# Patient Record
Sex: Female | Born: 1940
Health system: Southern US, Community
[De-identification: ages and names within clinical notes are randomized; demographics above are authoritative.]

## PROBLEM LIST (undated history)

## (undated) DIAGNOSIS — M75102 Unspecified rotator cuff tear or rupture of left shoulder, not specified as traumatic: Secondary | ICD-10-CM

## (undated) DIAGNOSIS — G44219 Episodic tension-type headache, not intractable: Secondary | ICD-10-CM

## (undated) DIAGNOSIS — K219 Gastro-esophageal reflux disease without esophagitis: Secondary | ICD-10-CM

## (undated) DIAGNOSIS — K648 Other hemorrhoids: Secondary | ICD-10-CM

## (undated) DIAGNOSIS — M1990 Unspecified osteoarthritis, unspecified site: Secondary | ICD-10-CM

## (undated) DIAGNOSIS — E785 Hyperlipidemia, unspecified: Secondary | ICD-10-CM

## (undated) DIAGNOSIS — H269 Unspecified cataract: Secondary | ICD-10-CM

## (undated) DIAGNOSIS — R011 Cardiac murmur, unspecified: Secondary | ICD-10-CM

## (undated) DIAGNOSIS — N200 Calculus of kidney: Secondary | ICD-10-CM

## (undated) DIAGNOSIS — H409 Unspecified glaucoma: Secondary | ICD-10-CM

## (undated) DIAGNOSIS — I10 Essential (primary) hypertension: Secondary | ICD-10-CM

## (undated) HISTORY — DX: Cardiac murmur, unspecified: R01.1

## (undated) HISTORY — DX: Unspecified osteoarthritis, unspecified site: M19.90

## (undated) HISTORY — DX: Essential (primary) hypertension: I10

## (undated) HISTORY — DX: Hyperlipidemia, unspecified: E78.5

## (undated) HISTORY — DX: Unspecified cataract: H26.9

## (undated) HISTORY — DX: Gastro-esophageal reflux disease without esophagitis: K21.9

## (undated) HISTORY — DX: Other hemorrhoids: K64.8

## (undated) HISTORY — DX: Episodic tension-type headache, not intractable: G44.219

## (undated) HISTORY — PX: EYE SURGERY: SHX253

## (undated) HISTORY — DX: Unspecified glaucoma: H40.9

## (undated) HISTORY — PX: COLONOSCOPY: SHX174

## (undated) HISTORY — PX: TOTAL ABDOMINAL HYSTERECTOMY W/ BILATERAL SALPINGOOPHORECTOMY: SHX83

---

## 1989-02-06 HISTORY — PX: TOTAL ABDOMINAL HYSTERECTOMY W/ BILATERAL SALPINGOOPHORECTOMY: SHX83

## 1999-06-11 ENCOUNTER — Encounter: Payer: Self-pay | Admitting: Obstetrics and Gynecology

## 1999-06-11 ENCOUNTER — Encounter: Admission: RE | Admit: 1999-06-11 | Discharge: 1999-06-11 | Payer: Self-pay | Admitting: Obstetrics & Gynecology

## 1999-12-31 ENCOUNTER — Other Ambulatory Visit: Admission: RE | Admit: 1999-12-31 | Discharge: 1999-12-31 | Payer: Self-pay | Admitting: Obstetrics and Gynecology

## 2000-11-23 ENCOUNTER — Encounter: Payer: Self-pay | Admitting: Obstetrics and Gynecology

## 2000-11-23 ENCOUNTER — Encounter: Admission: RE | Admit: 2000-11-23 | Discharge: 2000-11-23 | Payer: Self-pay | Admitting: Obstetrics and Gynecology

## 2001-04-11 ENCOUNTER — Encounter: Payer: Self-pay | Admitting: Gastroenterology

## 2001-04-28 ENCOUNTER — Encounter: Payer: Self-pay | Admitting: Gastroenterology

## 2001-06-16 ENCOUNTER — Encounter: Payer: Self-pay | Admitting: Gastroenterology

## 2001-09-15 ENCOUNTER — Other Ambulatory Visit: Admission: RE | Admit: 2001-09-15 | Discharge: 2001-09-15 | Payer: Self-pay | Admitting: Obstetrics and Gynecology

## 2002-06-08 HISTORY — PX: KNEE ARTHROSCOPY: SUR90

## 2002-12-21 ENCOUNTER — Encounter: Payer: Self-pay | Admitting: Obstetrics and Gynecology

## 2002-12-21 ENCOUNTER — Encounter: Admission: RE | Admit: 2002-12-21 | Discharge: 2002-12-21 | Payer: Self-pay | Admitting: Obstetrics and Gynecology

## 2004-04-22 ENCOUNTER — Encounter: Admission: RE | Admit: 2004-04-22 | Discharge: 2004-04-22 | Payer: Self-pay | Admitting: Obstetrics and Gynecology

## 2004-06-30 ENCOUNTER — Ambulatory Visit: Payer: Self-pay | Admitting: Internal Medicine

## 2004-07-03 ENCOUNTER — Ambulatory Visit: Payer: Self-pay | Admitting: Internal Medicine

## 2004-08-14 ENCOUNTER — Ambulatory Visit: Payer: Self-pay | Admitting: Internal Medicine

## 2004-08-20 ENCOUNTER — Ambulatory Visit: Payer: Self-pay | Admitting: Internal Medicine

## 2004-09-09 ENCOUNTER — Ambulatory Visit: Payer: Self-pay | Admitting: Internal Medicine

## 2004-09-26 ENCOUNTER — Ambulatory Visit: Payer: Self-pay | Admitting: Gastroenterology

## 2004-10-10 ENCOUNTER — Ambulatory Visit: Payer: Self-pay | Admitting: Gastroenterology

## 2004-12-10 ENCOUNTER — Ambulatory Visit: Payer: Self-pay | Admitting: Internal Medicine

## 2004-12-18 ENCOUNTER — Ambulatory Visit: Payer: Self-pay

## 2004-12-24 ENCOUNTER — Ambulatory Visit: Payer: Self-pay | Admitting: Internal Medicine

## 2005-07-23 ENCOUNTER — Encounter: Admission: RE | Admit: 2005-07-23 | Discharge: 2005-07-23 | Payer: Self-pay | Admitting: Obstetrics and Gynecology

## 2005-07-27 ENCOUNTER — Ambulatory Visit: Payer: Self-pay | Admitting: Internal Medicine

## 2005-09-07 ENCOUNTER — Ambulatory Visit: Payer: Self-pay | Admitting: Pulmonary Disease

## 2005-10-30 ENCOUNTER — Ambulatory Visit: Payer: Self-pay | Admitting: Pulmonary Disease

## 2005-11-16 ENCOUNTER — Ambulatory Visit: Payer: Self-pay | Admitting: Internal Medicine

## 2005-11-18 ENCOUNTER — Ambulatory Visit: Payer: Self-pay | Admitting: Cardiology

## 2005-11-23 ENCOUNTER — Ambulatory Visit: Payer: Self-pay | Admitting: Internal Medicine

## 2005-12-22 ENCOUNTER — Ambulatory Visit: Payer: Self-pay | Admitting: Internal Medicine

## 2006-07-16 ENCOUNTER — Ambulatory Visit: Payer: Self-pay | Admitting: Internal Medicine

## 2006-07-26 ENCOUNTER — Encounter: Admission: RE | Admit: 2006-07-26 | Discharge: 2006-07-26 | Payer: Self-pay | Admitting: Obstetrics and Gynecology

## 2006-11-18 ENCOUNTER — Ambulatory Visit: Payer: Self-pay | Admitting: Internal Medicine

## 2006-12-29 ENCOUNTER — Ambulatory Visit: Payer: Self-pay | Admitting: Internal Medicine

## 2006-12-29 LAB — CONVERTED CEMR LAB
AST: 19 units/L (ref 0–37)
Alkaline Phosphatase: 71 units/L (ref 39–117)
BUN: 16 mg/dL (ref 6–23)
Bilirubin, Direct: 0.1 mg/dL (ref 0.0–0.3)
CO2: 32 meq/L (ref 19–32)
Eosinophils Relative: 2.6 % (ref 0.0–5.0)
GFR calc Af Amer: 93 mL/min
GFR calc non Af Amer: 77 mL/min
Glucose, Bld: 94 mg/dL (ref 70–99)
HCT: 37.8 % (ref 36.0–46.0)
HDL: 47.1 mg/dL (ref >39.0)
Hemoglobin: 12.7 g/dL (ref 12.0–15.0)
Ketones, ur: NEGATIVE mg/dL
Lymphocytes Relative: 50.8 % — ABNORMAL HIGH (ref 12.0–46.0)
MCV: 89.4 fL (ref 78.0–100.0)
Neutro Abs: 2.1 10*3/uL (ref 1.4–7.7)
Neutrophils Relative %: 36.9 % — ABNORMAL LOW (ref 43.0–77.0)
Platelets: 185 10*3/uL (ref 150–400)
RDW: 12.8 % (ref 11.5–14.6)
Sed Rate: 13 mm/hr (ref 0–25)
Sodium: 147 meq/L — ABNORMAL HIGH (ref 135–145)
TSH: 1.25 microintl units/mL (ref 0.35–5.50)
Total CHOL/HDL Ratio: 5.1
Total Protein: 6.8 g/dL (ref 6.0–8.3)
Triglycerides: 90 mg/dL (ref 0–149)
Urobilinogen, UA: 0.2 (ref 0.0–1.0)
VLDL: 18 mg/dL (ref 0–40)

## 2007-01-12 ENCOUNTER — Ambulatory Visit: Payer: Self-pay | Admitting: Internal Medicine

## 2007-01-14 ENCOUNTER — Encounter: Admission: RE | Admit: 2007-01-14 | Discharge: 2007-02-11 | Payer: Self-pay | Admitting: Internal Medicine

## 2007-01-20 ENCOUNTER — Ambulatory Visit: Payer: Self-pay | Admitting: Internal Medicine

## 2007-01-20 LAB — CONVERTED CEMR LAB
Calcium: 9.5 mg/dL (ref 8.4–10.5)
Chloride: 112 meq/L (ref 96–112)
Creatinine, Ser: 0.8 mg/dL (ref 0.4–1.2)
GFR calc non Af Amer: 77 mL/min
Potassium: 3.6 meq/L (ref 3.5–5.1)
Sodium: 145 meq/L (ref 135–145)

## 2007-02-04 ENCOUNTER — Ambulatory Visit: Payer: Self-pay | Admitting: Internal Medicine

## 2007-04-22 ENCOUNTER — Ambulatory Visit: Payer: Self-pay | Admitting: Internal Medicine

## 2007-05-02 ENCOUNTER — Ambulatory Visit: Payer: Self-pay | Admitting: Internal Medicine

## 2007-05-02 ENCOUNTER — Encounter: Payer: Self-pay | Admitting: Internal Medicine

## 2007-06-23 ENCOUNTER — Telehealth (INDEPENDENT_AMBULATORY_CARE_PROVIDER_SITE_OTHER): Payer: Self-pay | Admitting: *Deleted

## 2007-06-30 ENCOUNTER — Ambulatory Visit: Payer: Self-pay | Admitting: Internal Medicine

## 2007-06-30 DIAGNOSIS — E669 Obesity, unspecified: Secondary | ICD-10-CM

## 2007-06-30 DIAGNOSIS — G44219 Episodic tension-type headache, not intractable: Secondary | ICD-10-CM | POA: Insufficient documentation

## 2007-06-30 DIAGNOSIS — K219 Gastro-esophageal reflux disease without esophagitis: Secondary | ICD-10-CM

## 2007-07-09 ENCOUNTER — Emergency Department (HOSPITAL_COMMUNITY): Admission: EM | Admit: 2007-07-09 | Discharge: 2007-07-09 | Payer: Self-pay | Admitting: Emergency Medicine

## 2007-07-12 ENCOUNTER — Ambulatory Visit: Payer: Self-pay | Admitting: Internal Medicine

## 2007-07-12 ENCOUNTER — Encounter: Admission: RE | Admit: 2007-07-12 | Discharge: 2007-07-12 | Payer: Self-pay | Admitting: Internal Medicine

## 2007-07-12 DIAGNOSIS — R1013 Epigastric pain: Secondary | ICD-10-CM | POA: Insufficient documentation

## 2007-07-14 ENCOUNTER — Telehealth (INDEPENDENT_AMBULATORY_CARE_PROVIDER_SITE_OTHER): Payer: Self-pay

## 2007-07-14 LAB — CONVERTED CEMR LAB
ALT: 15 units/L (ref 0–35)
AST: 16 units/L (ref 0–37)
Alkaline Phosphatase: 69 units/L (ref 39–117)
Bilirubin, Direct: 0.2 mg/dL (ref 0.0–0.3)
Total Bilirubin: 1.1 mg/dL (ref 0.3–1.2)

## 2007-07-22 ENCOUNTER — Ambulatory Visit: Payer: Self-pay | Admitting: Internal Medicine

## 2007-07-22 DIAGNOSIS — L509 Urticaria, unspecified: Secondary | ICD-10-CM | POA: Insufficient documentation

## 2007-07-22 LAB — CONVERTED CEMR LAB
Chloride: 104 meq/L (ref 96–112)
Creatinine, Ser: 1 mg/dL (ref 0.4–1.2)
Sodium: 141 meq/L (ref 135–145)

## 2007-08-01 ENCOUNTER — Ambulatory Visit: Payer: Self-pay | Admitting: Internal Medicine

## 2007-08-17 ENCOUNTER — Encounter: Admission: RE | Admit: 2007-08-17 | Discharge: 2007-08-17 | Payer: Self-pay | Admitting: Obstetrics and Gynecology

## 2007-12-06 ENCOUNTER — Ambulatory Visit: Payer: Self-pay | Admitting: Internal Medicine

## 2007-12-06 ENCOUNTER — Telehealth (INDEPENDENT_AMBULATORY_CARE_PROVIDER_SITE_OTHER): Payer: Self-pay | Admitting: *Deleted

## 2007-12-06 DIAGNOSIS — R209 Unspecified disturbances of skin sensation: Secondary | ICD-10-CM | POA: Insufficient documentation

## 2007-12-06 LAB — CONVERTED CEMR LAB
ALT: 28 units/L (ref 0–35)
AST: 49 units/L — ABNORMAL HIGH (ref 0–37)
Alkaline Phosphatase: 57 units/L (ref 39–117)
BUN: 10 mg/dL (ref 6–23)
Basophils Absolute: 0.1 10*3/uL (ref 0.0–0.1)
Bilirubin, Direct: 0.1 mg/dL (ref 0.0–0.3)
Calcium: 9.4 mg/dL (ref 8.4–10.5)
Creatinine, Ser: 1 mg/dL (ref 0.4–1.2)
Eosinophils Relative: 8.3 % — ABNORMAL HIGH (ref 0.0–5.0)
GFR calc Af Amer: 71 mL/min
GFR calc non Af Amer: 59 mL/min
Glucose, Bld: 115 mg/dL — ABNORMAL HIGH (ref 70–99)
HCT: 39 % (ref 36.0–46.0)
Lymphocytes Relative: 46.9 % — ABNORMAL HIGH (ref 12.0–46.0)
MCHC: 34.1 g/dL (ref 30.0–36.0)
Neutro Abs: 2.5 10*3/uL (ref 1.4–7.7)
Platelets: 206 10*3/uL (ref 150–400)
RBC: 4.33 M/uL (ref 3.87–5.11)
TSH: 1.28 microintl units/mL (ref 0.35–5.50)
Total Bilirubin: 0.8 mg/dL (ref 0.3–1.2)
WBC: 6.8 10*3/uL (ref 4.5–10.5)

## 2007-12-21 ENCOUNTER — Ambulatory Visit: Payer: Self-pay | Admitting: Internal Medicine

## 2007-12-30 LAB — CONVERTED CEMR LAB
BUN: 8 mg/dL (ref 6–23)
Basophils Relative: 0.5 % (ref 0.0–1.0)
Calcium: 9.1 mg/dL (ref 8.4–10.5)
Creatinine, Ser: 0.9 mg/dL (ref 0.4–1.2)
Eosinophils Absolute: 0.3 10*3/uL (ref 0.0–0.7)
Eosinophils Relative: 4.2 % (ref 0.0–5.0)
GFR calc Af Amer: 81 mL/min
GFR calc non Af Amer: 67 mL/min
Glucose, Bld: 132 mg/dL — ABNORMAL HIGH (ref 70–99)
HCT: 38.2 % (ref 36.0–46.0)
Lymphocytes Relative: 45.6 % (ref 12.0–46.0)
Monocytes Relative: 4.8 % (ref 3.0–12.0)
Neutro Abs: 2.7 10*3/uL (ref 1.4–7.7)
Platelets: 195 10*3/uL (ref 150–400)
Potassium: 3 meq/L — ABNORMAL LOW (ref 3.5–5.1)
Sodium: 144 meq/L (ref 135–145)

## 2008-01-03 ENCOUNTER — Ambulatory Visit: Payer: Self-pay | Admitting: Gastroenterology

## 2008-01-04 ENCOUNTER — Encounter: Payer: Self-pay | Admitting: Gastroenterology

## 2008-01-12 ENCOUNTER — Telehealth: Payer: Self-pay | Admitting: Gastroenterology

## 2008-02-02 ENCOUNTER — Ambulatory Visit: Payer: Self-pay | Admitting: Internal Medicine

## 2008-02-02 DIAGNOSIS — R609 Edema, unspecified: Secondary | ICD-10-CM

## 2008-02-02 LAB — CONVERTED CEMR LAB
BUN: 16 mg/dL (ref 6–23)
CO2: 28 meq/L (ref 19–32)
Creatinine, Ser: 1.1 mg/dL (ref 0.4–1.2)
GFR calc Af Amer: 64 mL/min
Glucose, Bld: 94 mg/dL (ref 70–99)
Sodium: 140 meq/L (ref 135–145)

## 2008-02-06 ENCOUNTER — Ambulatory Visit: Payer: Self-pay | Admitting: Gastroenterology

## 2008-03-15 ENCOUNTER — Ambulatory Visit: Payer: Self-pay | Admitting: Internal Medicine

## 2008-03-15 LAB — CONVERTED CEMR LAB
BUN: 19 mg/dL (ref 6–23)
Calcium: 9.3 mg/dL (ref 8.4–10.5)
Creatinine, Ser: 1 mg/dL (ref 0.4–1.2)
GFR calc Af Amer: 71 mL/min
GFR calc non Af Amer: 59 mL/min
Glucose, Bld: 94 mg/dL (ref 70–99)
Sodium: 139 meq/L (ref 135–145)

## 2008-05-21 ENCOUNTER — Ambulatory Visit: Payer: Self-pay | Admitting: Internal Medicine

## 2008-08-07 ENCOUNTER — Ambulatory Visit: Payer: Self-pay | Admitting: Gastroenterology

## 2008-08-08 ENCOUNTER — Encounter: Payer: Self-pay | Admitting: Gastroenterology

## 2008-08-20 ENCOUNTER — Encounter: Admission: RE | Admit: 2008-08-20 | Discharge: 2008-08-20 | Payer: Self-pay | Admitting: Obstetrics and Gynecology

## 2008-09-21 ENCOUNTER — Ambulatory Visit: Payer: Self-pay | Admitting: Internal Medicine

## 2008-09-21 DIAGNOSIS — J309 Allergic rhinitis, unspecified: Secondary | ICD-10-CM | POA: Insufficient documentation

## 2008-09-21 DIAGNOSIS — K625 Hemorrhage of anus and rectum: Secondary | ICD-10-CM

## 2008-10-04 ENCOUNTER — Ambulatory Visit: Payer: Self-pay | Admitting: Gastroenterology

## 2008-10-04 DIAGNOSIS — K921 Melena: Secondary | ICD-10-CM

## 2008-10-04 DIAGNOSIS — K5900 Constipation, unspecified: Secondary | ICD-10-CM | POA: Insufficient documentation

## 2008-10-04 DIAGNOSIS — M545 Low back pain: Secondary | ICD-10-CM

## 2008-10-16 ENCOUNTER — Telehealth: Payer: Self-pay | Admitting: Gastroenterology

## 2008-11-07 ENCOUNTER — Telehealth (INDEPENDENT_AMBULATORY_CARE_PROVIDER_SITE_OTHER): Payer: Self-pay | Admitting: *Deleted

## 2008-11-20 ENCOUNTER — Ambulatory Visit: Payer: Self-pay | Admitting: Gastroenterology

## 2008-11-20 HISTORY — PX: COLONOSCOPY: SHX5424

## 2009-01-10 ENCOUNTER — Encounter: Payer: Self-pay | Admitting: Internal Medicine

## 2009-02-12 ENCOUNTER — Ambulatory Visit: Payer: Self-pay | Admitting: Internal Medicine

## 2009-02-12 ENCOUNTER — Encounter: Payer: Self-pay | Admitting: Adult Health

## 2009-02-12 DIAGNOSIS — R310 Gross hematuria: Secondary | ICD-10-CM

## 2009-02-12 LAB — CONVERTED CEMR LAB
Total Protein, Urine: 100 mg/dL
pH: 5 (ref 5.0–8.0)

## 2009-02-13 ENCOUNTER — Encounter: Payer: Self-pay | Admitting: Adult Health

## 2009-02-13 LAB — CONVERTED CEMR LAB
Basophils Absolute: 0 10*3/uL (ref 0.0–0.1)
Basophils Relative: 0.5 % (ref 0.0–3.0)
Calcium: 9.7 mg/dL (ref 8.4–10.5)
Chloride: 107 meq/L (ref 96–112)
Creatinine, Ser: 1.2 mg/dL (ref 0.4–1.2)
GFR calc non Af Amer: 57.45 mL/min (ref >60)
Glucose, Bld: 78 mg/dL (ref 70–99)
Lymphocytes Relative: 46.7 % — ABNORMAL HIGH (ref 12.0–46.0)
Lymphs Abs: 3.1 10*3/uL (ref 0.7–4.0)
MCHC: 33.8 g/dL (ref 30.0–36.0)
Monocytes Absolute: 0.5 10*3/uL (ref 0.1–1.0)
Neutro Abs: 3 10*3/uL (ref 1.4–7.7)
Neutrophils Relative %: 44.6 % (ref 43.0–77.0)
Potassium: 4.5 meq/L (ref 3.5–5.1)
Sodium: 142 meq/L (ref 135–145)

## 2009-02-19 ENCOUNTER — Ambulatory Visit: Payer: Self-pay | Admitting: Gastroenterology

## 2009-02-19 DIAGNOSIS — K648 Other hemorrhoids: Secondary | ICD-10-CM | POA: Insufficient documentation

## 2009-02-25 ENCOUNTER — Ambulatory Visit: Payer: Self-pay | Admitting: Internal Medicine

## 2009-02-25 DIAGNOSIS — E785 Hyperlipidemia, unspecified: Secondary | ICD-10-CM | POA: Insufficient documentation

## 2009-02-25 LAB — CONVERTED CEMR LAB
Albumin: 4.1 g/dL (ref 3.5–5.2)
Alkaline Phosphatase: 85 units/L (ref 39–117)
Bilirubin, Direct: 0.1 mg/dL (ref 0.0–0.3)
Cholesterol: 235 mg/dL — ABNORMAL HIGH (ref 0–200)
HDL: 50.2 mg/dL (ref >39.00)
Total Bilirubin: 1 mg/dL (ref 0.3–1.2)
Total CHOL/HDL Ratio: 5
Triglycerides: 69 mg/dL (ref 0.0–149.0)

## 2009-02-27 ENCOUNTER — Encounter: Payer: Self-pay | Admitting: Adult Health

## 2009-03-18 ENCOUNTER — Ambulatory Visit (HOSPITAL_COMMUNITY): Admission: RE | Admit: 2009-03-18 | Discharge: 2009-03-18 | Payer: Self-pay | Admitting: Urology

## 2009-06-17 ENCOUNTER — Encounter: Payer: Self-pay | Admitting: Adult Health

## 2009-07-08 ENCOUNTER — Ambulatory Visit: Payer: Self-pay | Admitting: Internal Medicine

## 2009-09-05 ENCOUNTER — Encounter: Admission: RE | Admit: 2009-09-05 | Discharge: 2009-09-05 | Payer: Self-pay | Admitting: Obstetrics and Gynecology

## 2009-10-01 ENCOUNTER — Telehealth: Payer: Self-pay | Admitting: Pulmonary Disease

## 2009-10-17 ENCOUNTER — Ambulatory Visit: Payer: Self-pay | Admitting: Internal Medicine

## 2009-11-28 ENCOUNTER — Ambulatory Visit: Payer: Self-pay | Admitting: Internal Medicine

## 2010-02-26 ENCOUNTER — Ambulatory Visit: Payer: Self-pay | Admitting: Internal Medicine

## 2010-06-10 ENCOUNTER — Encounter: Payer: Self-pay | Admitting: Adult Health

## 2010-06-10 ENCOUNTER — Ambulatory Visit
Admission: RE | Admit: 2010-06-10 | Discharge: 2010-06-10 | Payer: Self-pay | Source: Home / Self Care | Attending: Internal Medicine | Admitting: Internal Medicine

## 2010-06-10 ENCOUNTER — Other Ambulatory Visit: Payer: Self-pay | Admitting: Adult Health

## 2010-06-10 DIAGNOSIS — R1012 Left upper quadrant pain: Secondary | ICD-10-CM | POA: Insufficient documentation

## 2010-06-10 LAB — CONVERTED CEMR LAB
Casts: NONE SEEN /lpf
Crystals: NONE SEEN

## 2010-06-10 LAB — URINALYSIS
Bilirubin Urine: NEGATIVE
Hemoglobin, Urine: NEGATIVE
Ketones, ur: NEGATIVE
Leukocytes, UA: NEGATIVE
Nitrite: NEGATIVE
Specific Gravity, Urine: 1.015 (ref 1.000–1.030)
Total Protein, Urine: NEGATIVE
Urine Glucose: NEGATIVE
Urobilinogen, UA: 0.2 (ref 0.0–1.0)
pH: 6 (ref 5.0–8.0)

## 2010-06-10 LAB — BASIC METABOLIC PANEL
BUN: 20 mg/dL (ref 6–23)
CO2: 22 mEq/L (ref 19–32)
Calcium: 9.7 mg/dL (ref 8.4–10.5)
Chloride: 110 mEq/L (ref 96–112)
Creatinine, Ser: 0.9 mg/dL (ref 0.4–1.2)
GFR: 84.05 mL/min (ref >60.00)
Glucose, Bld: 91 mg/dL (ref 70–99)
Potassium: 4.2 mEq/L (ref 3.5–5.1)
Sodium: 140 mEq/L (ref 135–145)

## 2010-06-10 LAB — HEPATIC FUNCTION PANEL
ALT: 14 U/L (ref 0–35)
AST: 19 U/L (ref 0–37)
Albumin: 3.8 g/dL (ref 3.5–5.2)
Alkaline Phosphatase: 91 U/L (ref 39–117)
Bilirubin, Direct: 0.1 mg/dL (ref 0.0–0.3)
Total Bilirubin: 0.7 mg/dL (ref 0.3–1.2)
Total Protein: 7.6 g/dL (ref 6.0–8.3)

## 2010-06-10 LAB — CBC WITH DIFFERENTIAL/PLATELET
Basophils Absolute: 0 10*3/uL (ref 0.0–0.1)
Basophils Relative: 0.5 % (ref 0.0–3.0)
Eosinophils Absolute: 0.1 10*3/uL (ref 0.0–0.7)
Eosinophils Relative: 1.9 % (ref 0.0–5.0)
HCT: 40.8 % (ref 36.0–46.0)
Hemoglobin: 13.6 g/dL (ref 12.0–15.0)
Lymphocytes Relative: 46.9 % — ABNORMAL HIGH (ref 12.0–46.0)
Lymphs Abs: 3.3 10*3/uL (ref 0.7–4.0)
MCHC: 33.3 g/dL (ref 30.0–36.0)
MCV: 91.4 fl (ref 78.0–100.0)
Monocytes Absolute: 0.4 10*3/uL (ref 0.1–1.0)
Monocytes Relative: 6 % (ref 3.0–12.0)
Neutro Abs: 3.2 10*3/uL (ref 1.4–7.7)
Neutrophils Relative %: 44.7 % (ref 43.0–77.0)
Platelets: 208 10*3/uL (ref 150.0–400.0)
RBC: 4.46 Mil/uL (ref 3.87–5.11)
RDW: 12.9 % (ref 11.5–14.6)
WBC: 7.1 10*3/uL (ref 4.5–10.5)

## 2010-06-10 LAB — LIPASE: Lipase: 23 U/L (ref 11.0–59.0)

## 2010-06-10 LAB — SEDIMENTATION RATE: Sed Rate: 23 mm/hr — ABNORMAL HIGH (ref 0–22)

## 2010-06-10 LAB — AMYLASE: Amylase: 77 U/L (ref 27–131)

## 2010-06-11 ENCOUNTER — Encounter: Payer: Self-pay | Admitting: Adult Health

## 2010-06-12 ENCOUNTER — Telehealth: Payer: Self-pay | Admitting: Adult Health

## 2010-06-13 ENCOUNTER — Ambulatory Visit
Admission: RE | Admit: 2010-06-13 | Discharge: 2010-06-13 | Payer: Self-pay | Source: Home / Self Care | Attending: Internal Medicine | Admitting: Internal Medicine

## 2010-06-20 ENCOUNTER — Encounter: Payer: Self-pay | Admitting: Internal Medicine

## 2010-06-25 ENCOUNTER — Encounter: Payer: Self-pay | Admitting: Internal Medicine

## 2010-06-25 ENCOUNTER — Ambulatory Visit
Admission: RE | Admit: 2010-06-25 | Discharge: 2010-06-25 | Payer: Self-pay | Source: Home / Self Care | Attending: Internal Medicine | Admitting: Internal Medicine

## 2010-07-08 NOTE — Progress Notes (Signed)
Summary: sick    Phone Note Call from Patient Call back at [phone]   Caller: Patient Call For: Catherine Hoffman Reason for Call: Talk to Nurse Summary of Call: plegm and cough x 3-4 days.  Meds not helping - allergies, welts,itching.. pt called back. call cell [phone] Initial call taken by: Eugene Gavia,  October 01, 2009 9:24 AM  Follow-up for Phone Call        [facility].    Megan Reynolds LPN  October 01, 2009 10:02 AM   PT RETURNED CALL FROM TRIAGE. CALL CELL [phone]. Tivis Ringer, CNA  October 01, 2009 12:10 PM  MW pt--pt c/o productive cough with yellow phlegm, fever, wheezing, itching and whelps on arms and legs x 4 days. Please advise. Carron Curie CMA  October 01, 2009 2:41 PM allergies: NKDA  [facility]  Additional Follow-up for Phone Call Additional follow up Details #1::        per SN--augmentin 875mg  1 tablet two times a day #14, and medrol dose pak #1. Rx sent. pt aware.Carron Curie CMA  October 01, 2009 3:55 PM     New/Updated Medications: AUGMENTIN 875-125 MG TABS (AMOXICILLIN-POT CLAVULANATE) Take 1 tablet by mouth two times a day MEDROL (PAK) 4 MG TABS (METHYLPREDNISOLONE) as directed Prescriptions: MEDROL (PAK) 4 MG TABS (METHYLPREDNISOLONE) as directed  #1 pak x 0   Entered by:   Carron Curie CMA   Authorized by:   Michele Mcalpine MD   Signed by:   Carron Curie CMA on 10/01/2009   Method used:   Electronically to        [facility] [id number] (retail)       [address] [place], [place]  [address]       Ph: [phone]       Fax: [phone]   RxID:   [id number] AUGMENTIN 875-125 MG TABS (AMOXICILLIN-POT CLAVULANATE) Take 1 tablet by mouth two times a day  #14 x 0   Entered by:   Carron Curie CMA   Authorized by:   Michele Mcalpine MD   Signed by:   Carron Curie CMA on 10/01/2009   Method used:   Electronically to        [facility] [id number] (retail)       [address] [place], [place]  [address]       Ph: [phone]       Fax: [phone]   RxID:   [id number]

## 2010-07-08 NOTE — Assessment & Plan Note (Signed)
Summary: Primary svc/ f/u edema/hyperlipidemia   Primary Provider/Referring Provider:  Sandrea Hughs, MD  CC:  3 month followup.  Pt denies any complaints today.Catherine Hoffman  History of Present Illness: 70 yobf  never smoker with hypertension, degenerative arthritis, and difficulty following through on instructions regarding medications and weight loss.a .   September 21, 2008 new onset  BRBPR  < 1 tbsp 4/14 resolved but c/o chronic constipation worse than usual, thinks she was straining with bm. No melena or abd pain. also c/o increase nasal stuffy x 2 week with onset of heavy pollen, nasal steroids not working.  Referred to GI  July 08, 2009 3 month followup.  Pt denies any complaints today. edema well controlled. Pt denies any significant sore throat, dysphagia, itching, sneezing,  nasal congestion or excess secretions,  fever, chills, sweats, unintended wt loss, pleuritic or exertional cp, hempoptysis, change in activity tolerance  orthopnea pnd or leg swelling   Current Medications (verified): 1)  Omeprazole 40 Mg Cpdr (Omeprazole) .... Take 1 Capsule By Mouth Two Times A Day 30 Minutes Before Meals 2)  Aspirin 81 Mg Tbec (Aspirin) .Catherine Hoffman.. 1 Once Daily 3)  Aldactone 25 Mg  Tabs (Spironolactone) .... Take 1 Tablet By Mouth Two Times A Day 4)  Klor-Con M20 20 Meq  Tbcr (Potassium Chloride Crys Cr) .Catherine Hoffman.. 1 By Mouth  Once Daily 5)  Trazodone Hcl 50 Mg  Tabs (Trazodone Hcl) .... 2 By Mouth At Bedtime 6)  Oscal 500/200 D-3 500-200 Mg-Unit  Tabs (Calcium-Vitamin D) .... Take 1 Tablet By Mouth Two Times A Day 7)  Premarin 0.3 Mg  Tabs (Estrogens Conjugated) .... Once Daily 8)  Meloxicam 7.5 Mg  Tabs (Meloxicam) .Catherine Hoffman.. 1 Tab Every 12 Hours As Needed 9)  Meclizine Hcl 25 Mg Tabs (Meclizine Hcl) .Catherine Hoffman.. 1 Tab Every 4 Hours As Needed For Dizziness 10)  Tylenol Ex St Arthritis Pain 500 Mg  Tabs (Acetaminophen) .... Per Bottle As Needed 11)  Claritin 10 Mg  Tabs (Loratadine) .... Take 1 Tablet By Mouth Once A Day As  Needed 12)  Fluticasone Propionate 50 Mcg/act Susp (Fluticasone Propionate) .... 2 Puffs Two Times A Day As Needed 13)  Sorbitol 70 % Soln (Sorbitol) .Catherine Hoffman.. 1-2 Tbsp At Bedtime As Needed For Constipation 14)  Afrin Nasal Spray 0.05 % Soln (Oxymetazoline Hcl) .... As Directed As Needed  Allergies (verified): No Known Drug Allergies  Past History:  Past Medical History: GERD(ICD-530.81)..............................................................................Catherine KitchenRussella Dar    - EGD  04/28/2001  esophagitis with HH Chronic constipation Internal Hemorrhoids    - Colonoscopy 5/52006    - Colonoscopy ok except hem 11/20/08 MORBID OBESITY (ICD-278.01)     - Target wt  =  158  for BMI < 30  EPISODIC TENSION TYPE HEADACHE (ICD-339.11) Hypertension Degenerative arthritis   - Arthroscopy Left knee, Dr Barry Dienes Herpes zoster-1998 with no significant residual R T 12 Hyperlipidemia     - [place] LDL < 160 (HBP only known RF)  Health Maintenance....................................................................................Catherine KitchenWert    - Med Calendar 03/15/08    - Pneumovax February 25, 2009     - Td February 25, 2009    - CPX  February 25, 2009   Vital Signs:  Patient profile:   70 year old female Weight:      201 pounds O2 Sat:      98 % on Room air Temp:     97.5 degrees F oral Pulse rate:   67 / minute BP sitting:   128 / 82  (  left arm)  Vitals Entered By: Vernie Murders (July 08, 2009 4:28 PM)  O2 Flow:  Room air  Physical Exam  Additional Exam:  wt  207 May 21, 2008 > 202 September 21, 2008 >>205 February 12, 2009 > 192 February 25, 2009 > 201 July 08, 2009  GENERAL:  A/Ox3; pleasant & cooperative.NAD HEENT:  [place]/AT, EOM-wnl, PERRLA, EACs-clear, TMs-wnl, NOSE-mod nonspecific Turbinate edema, THROAT-clear & wnl. NECK:  Supple w/ fair ROM; no JVD; normal carotid impulses w/o bruits; no thyromegaly or nodules palpated; no lymphadenopathy. CHEST:  Clear to P & A; w/o, wheezes/  rales/ or rhonchi. HEART:  RRR, no m/r/g  heard.  pedal pulses sym reduced bilaterally ABDOMEN:  Soft & nt; nml bowel sounds; no organomegaly or masses detected. Neg CVA tenderness EXT: Warm bilat,  no calf pain, edema, clubbing, pulses intact    Impression & Recommendations:  Problem # 1:  EDEMA (ICD-782.3) resolved on rx    Each maintenance medication was reviewed in detail including most importantly the difference between maintenance and as needed and under what circumstances the prns are to be used. This was done in the context of a medication calendar review which provided the patient with a user-friendly unambiguous mechanism for medication administration and reconciliation and provides an action plan for all active problems. It is critical that this be shown to every doctor  for modification during the office visit if necessary so the patient can use it as a working document.   Problem # 2:  HYPERCHOLESTEROLEMIA (ICD-272.0)      - [place] LDL < 160 (HBP only known RF) m  last 166, so could easily reach this with wt loss  Labs Reviewed: SGOT: 21 (02/25/2009)   SGPT: 16 (02/25/2009)   HDL:50.20 (02/25/2009), 47.1 (12/29/2006)  LDL:DEL (12/29/2006)  Chol:235 (02/25/2009), 239 (12/29/2006)  Trig:69.0 (02/25/2009), 90 (12/29/2006)  Other Orders: Est. Patient Level III (04540) Flu Vaccine 54yrs + (98119) Admin 1st Vaccine (14782)  Patient Instructions: 1)  See calendar for specific medication instructions and bring it back for each and every office visit for every healthcare provider you see.  Without it,  you may not receive the best quality medical care that we feel you deserve.  2)  Weight control is simply a matter of calorie balance which needs to be tilted in your favor by eating less and exercising more.  To get the most out of exercise, you need to be continuously aware that you are short of breath, but never out of breath, for 30 minutes daily. As you improve, it will actually  be easier for you to do the same amount in  30 minutes so always push to the level where you are short of breath.  If this does not result in gradual weight reduction,  I recommend  a nutritionist for a food diary 3)  Return to office in 3 months, sooner if needed    Immunizations Administered:  Influenza Vaccine # 1:    Vaccine Type: Fluvax 3+    Site: left deltoid    Mfr: GlaxoSmithKline    Dose: 0.5 ml    Route: IM    Given by: Zackery Barefoot CMA    Exp. Date: 12/05/2009    Lot #: [id number]  Flu Vaccine Consent Questions:    Do you have a history of severe allergic reactions to this vaccine? no    Any prior history of allergic reactions to egg and/or gelatin? no    Do you have a  sensitivity to the preservative Thimersol? no    Do you have a past history of Guillan-Barre Syndrome? no    Do you currently have an acute febrile illness? no    Have you ever had a severe reaction to latex? no    Vaccine information given and explained to patient? yes    Are you currently pregnant? no

## 2010-07-08 NOTE — Letter (Signed)
[facility]: [facility]   Imported By: Sherian Rein 06/25/2009 07:21:14  _____________________________________________________________________  External Attachment:    Type:   Image     Comment:   External Document

## 2010-07-08 NOTE — Assessment & Plan Note (Signed)
Summary: Primary svc/ f/u edema/ weight, cholesterol   Primary Provider/Referring Provider:  Sandrea Hughs, MD  CC:  3 month followup.  Marland Kitchen  History of Present Illness: 21  yobf  never smoker with hypertension, degenerative arthritis, and difficulty following through on instructions regarding medications and weight loss.a .   September 21, 2008 new onset  BRBPR  < 1 tbsp 4/14 resolved but c/o chronic constipation worse than usual, thinks she was straining with bm. No melena or abd pain. also c/o increase nasal stuffy x 2 week with onset of heavy pollen, nasal steroids not working.  Referred to GI  January 31, 2011cc  edema well controlled. rec work on wt  May 12, 2011ov  c/o runny nose and headaches x several wks.  She relates this to seasonal allergies.  She also states that she breaks out in hives occ after eating certain foods such as fruits.  She also c/o occ chest tightness. no better on clariton, fluctisaone.     11/25/09 ov  med review new med calendar  February 26, 2010 ov  f/u edema, better.  No orthopnea.  Obesity, lost 6 lbs at [facility].  Pt denies any significant sore throat, dysphagia, itching, sneezing,  nasal congestion or excess secretions,  fever, chills, sweats, unintended wt loss, pleuritic or exertional cp, hempoptysis, change in activity tolerance  .   Current Medications (verified): 1)  Omeprazole 40 Mg Cpdr (Omeprazole) .... Take 1 Capsule By Mouth Before First and Last Meals of The Day 2)  Aspirin 81 Mg Tbec (Aspirin) .Marland Kitchen.. 1 Once Daily 3)  Aldactone 25 Mg  Tabs (Spironolactone) .... Take 1 Tablet By Mouth Two Times A Day 4)  Klor-Con M20 20 Meq  Tbcr (Potassium Chloride Crys Cr) .Marland Kitchen.. 1 By Mouth  Once Daily 5)  Trazodone Hcl 50 Mg  Tabs (Trazodone Hcl) .... 2 By Mouth At Bedtime 6)  Oscal 500/200 D-3 500-200 Mg-Unit  Tabs (Calcium-Vitamin D) .... Take 1 Tablet By Mouth Two Times A Day 7)  Premarin 0.3 Mg  Tabs (Estrogens Conjugated) .... Once Daily 8)  Fluticasone  Propionate 50 Mcg/act Susp (Fluticasone Propionate) .... 2 Puffs Each Nostril Two Times A Day 9)  Stool Softener 100 Mg Caps (Docusate Sodium) .... Take 1 Capsule By Mouth At Bedtime 10)  Meloxicam 7.5 Mg  Tabs (Meloxicam) .Marland Kitchen.. 1 Tab Every 12 Hours As Needed 11)  Meclizine Hcl 25 Mg Tabs (Meclizine Hcl) .Marland Kitchen.. 1 Tab Every 4 Hours As Needed For Dizziness 12)  Tylenol Ex St Arthritis Pain 500 Mg  Tabs (Acetaminophen) .... Per Bottle As Needed 13)  Zyrtec Allergy 10 Mg  Tabs (Cetirizine Hcl) .... Take 1 Tab By Mouth At Bedtime As Needed 14)  Afrin Nasal Spray 0.05 % Soln (Oxymetazoline Hcl) .... 2 Puffs Twice Daily Before Fluticasone For 5 Days As Needed 15)  Sorbitol 70 % Soln (Sorbitol) .Marland Kitchen.. 1-2 Tbsp At Bedtime As Needed For Constipation  Allergies (verified): No Known Drug Allergies  Past History:  Past Medical History: GERD(ICD-530.81)..............................................................................Marland KitchenRussella Dar    - EGD  04/28/2001  esophagitis with HH Chronic constipation Internal Hemorrhoids    - Colonoscopy 5/52006    - Colonoscopy ok except hem 11/20/08 MORBID OBESITY (ICD-278.01)     - Target wt  =  158  for BMI < 30  EPISODIC TENSION TYPE HEADACHE (ICD-339.11) Hypertension Degenerative arthritis   - Arthroscopy Left knee, Dr Barry Dienes Herpes zoster-1998 with no significant residual R T 12 Hyperlipidemia     - [place] LDL <  160 (HBP only known RF)  Health Maintenance..........................................................................................Marland KitchenWert    - Med Calendar 03/15/08    - Pneumovax February 25, 2009     - Td February 25, 2009    - CPX  February 25, 2009   Vital Signs:  Patient profile:   70 year old female Weight:      193 pounds O2 Sat:      97 % on Room air Temp:     97.5 degrees F oral Pulse rate:   62 / minute BP sitting:   124 / 78  (right arm)  Vitals Entered By: Vernie Murders (February 26, 2010 9:15 AM)  O2 Flow:  Room air  Physical  Exam  Additional Exam:  wt  207 May 21, 2008  > 201 July 08, 2009 > 198 Oct 17, 2009 > 193 February 26, 2010  GENERAL:  A/Ox3; pleasant & cooperative.NAD HEENT:  [place]/AT, EOM-wnl, PERRLA, EACs-clear, TMs-wnl, NOSE-mod nonspecific Turbinate edema, THROAT-clear & wnl. NECK:  Supple w/ fair ROM; no JVD; normal carotid impulses w/o bruits; no thyromegaly or nodules palpated; no lymphadenopathy. CHEST:  Clear to P & A; w/o, wheezes/ rales/ or rhonchi. HEART:  RRR, no m/r/g  heard.  pedal pulses sym reduced bilaterally ABDOMEN:  Soft & nt; nml bowel sounds; no organomegaly or masses detected. Neg CVA tenderness EXT: Warm bilat,  no calf pain, edema, clubbing, pulses intact    Impression & Recommendations:  Problem # 1:  EDEMA (ICD-782.3)  ok on rx  Problem # 2:  CHRONIC RHINITIS (ICD-472.0)  Each maintenance medication was reviewed in detail including most importantly the difference between maintenance and as needed and under what circumstances the prns are to be used. This was done in the context of a medication calendar review which provided the patient with a user-friendly unambiguous mechanism for medication administration and reconciliation and provides an action plan for all active problems. It is critical that this be shown to every doctor  for modification during the office visit if necessary so the patient can use it as a working document.   Problem # 3:  HYPERCHOLESTEROLEMIA (ICD-272.0)    Labs Reviewed: SGOT: 21 (02/25/2009)   SGPT: 16 (02/25/2009)   HDL:50.20 (02/25/2009), 47.1 (12/29/2006)  LDL:DEL (12/29/2006)  Chol:235 (02/25/2009), 239 (12/29/2006)  Trig:69.0 (02/25/2009), 90 (12/29/2006)   CPX with fasting labs due  Problem # 4:  MORBID OBESITY (ICD-278.01)    - Target wt  =  158  for BMI < 30   vs 193 now vs 199  3 months ago so heading in the right direction.   Weight control is a matter of calorie balance which needs to be tilted in the pt's favor by eating  less and exercising more.  Specifically, I recommended  exercise at a level where pt  is short of breath but not out of breath 30 minutes daily.  If not losing weight on this program, I would strongly recommend pt see a nutritionist with a food diary recorded for two weeks prior to the visit.     Other Orders: Est. Patient Level III (82956)  Patient Instructions: 1)  Return to office in 3 months, sooner if needed with CPX on return    Appended Document: Primary svc/ f/u edema/ weight, cholesterol    Flu Vaccine Consent Questions     Do you have a history of severe allergic reactions to this vaccine? no    Any prior history of allergic reactions to egg and/or gelatin? no  Do you have a sensitivity to the preservative Thimersol? no    Do you have a past history of Guillan-Barre Syndrome? no    Do you currently have an acute febrile illness? no    Have you ever had a severe reaction to latex? no    Vaccine information given and explained to patient? yes    Are you currently pregnant? no    Lot Number:[id number]   Exp Date:12/06/2010   Site Given  Left Deltoid IM Vernie Murders  February 26, 2010 9:49 AM   Clinical Lists Changes  Orders: Added new Service order of Admin 1st Vaccine (13086) - Signed Added new Service order of Flu Vaccine 22yrs + [phone]) - Signed Observations: Added new observation of FLU VAX VIS: 12/31/09 version (02/26/2010 9:49) Added new observation of FLU VAXLOT: [id number] (02/26/2010 9:49) Added new observation of FLU VAXMFR: Glaxosmithkline (02/26/2010 9:49) Added new observation of FLU VAX EXP: 12/06/2010 (02/26/2010 9:49) Added new observation of FLU VAX DSE: 0.52ml (02/26/2010 9:49) Added new observation of FLU VAX: Fluvax 3+ (02/26/2010 9:49)

## 2010-07-08 NOTE — Assessment & Plan Note (Signed)
Summary: Primary svc/ ext f/u with review of CR Rx    Primary Provider/Referring Provider:  Sandrea Hughs, MD  CC:  Followup.  Pt c/o runny nose and headaches x several wks.  She relates this to seasonal allergies.  She also states that she breaks out in hives occ after eating certain foods such as fruits.  She also c/o occ chest tightness.Marland Kitchen  History of Present Illness: 57 yobf  never smoker with hypertension, degenerative arthritis, and difficulty following through on instructions regarding medications and weight loss.a .   September 21, 2008 new onset  BRBPR  < 1 tbsp 4/14 resolved but c/o chronic constipation worse than usual, thinks she was straining with bm. No melena or abd pain. also c/o increase nasal stuffy x 2 week with onset of heavy pollen, nasal steroids not working.  Referred to GI  January 31, 2011cc  edema well controlled. rec work on wt  May 12, 2011ov  c/o runny nose and headaches x several wks.  She relates this to seasonal allergies.  She also states that she breaks out in hives occ after eating certain foods such as fruits.  She also c/o occ chest tightness. no better on clariton, fluctisaone.  Pt denies any significant sore throat, dysphagia, itching, sneezing,  nasal congestion or excess secretions,  fever, chills, sweats, unintended wt loss, pleuritic or exertional cp, hempoptysis, change in activity tolerance  orthopnea pnd or leg swelling.  Current Medications (verified): 1)  Omeprazole 40 Mg Cpdr (Omeprazole) .... Take 1 Capsule By Mouth Two Times A Day 30 Minutes Before Meals 2)  Aspirin 81 Mg Tbec (Aspirin) .Marland Kitchen.. 1 Once Daily 3)  Aldactone 25 Mg  Tabs (Spironolactone) .... Take 1 Tablet By Mouth Two Times A Day 4)  Klor-Con M20 20 Meq  Tbcr (Potassium Chloride Crys Cr) .Marland Kitchen.. 1 By Mouth  Once Daily 5)  Trazodone Hcl 50 Mg  Tabs (Trazodone Hcl) .... 2 By Mouth At Bedtime 6)  Oscal 500/200 D-3 500-200 Mg-Unit  Tabs (Calcium-Vitamin D) .... Take 1 Tablet By Mouth Two Times A  Day 7)  Premarin 0.3 Mg  Tabs (Estrogens Conjugated) .... Once Daily 8)  Meloxicam 7.5 Mg  Tabs (Meloxicam) .Marland Kitchen.. 1 Tab Every 12 Hours As Needed 9)  Meclizine Hcl 25 Mg Tabs (Meclizine Hcl) .Marland Kitchen.. 1 Tab Every 4 Hours As Needed For Dizziness 10)  Tylenol Ex St Arthritis Pain 500 Mg  Tabs (Acetaminophen) .... Per Bottle As Needed 11)  Claritin 10 Mg  Tabs (Loratadine) .... Take 1 Tablet By Mouth Once A Day As Needed 12)  Fluticasone Propionate 50 Mcg/act Susp (Fluticasone Propionate) .... 2 Puffs Two Times A Day As Needed 13)  Sorbitol 70 % Soln (Sorbitol) .Marland Kitchen.. 1-2 Tbsp At Bedtime As Needed For Constipation 14)  Afrin Nasal Spray 0.05 % Soln (Oxymetazoline Hcl) .... As Directed As Needed  Allergies (verified): No Known Drug Allergies  Past History:  Past Medical History: GERD(ICD-530.81)..............................................................................Marland KitchenRussella Dar    - EGD  04/28/2001  esophagitis with HH Chronic constipation Internal Hemorrhoids    - Colonoscopy 5/52006    - Colonoscopy ok except hem 11/20/08 MORBID OBESITY (ICD-278.01)     - Target wt  =  158  for BMI < 30  EPISODIC TENSION TYPE HEADACHE (ICD-339.11) Hypertension Degenerative arthritis   - Arthroscopy Left knee, Dr Barry Dienes Herpes zoster-1998 with no significant residual R T 12 Hyperlipidemia     - Targe LDL < 160 (HBP only known RF)  Health Maintenance.......................................................................................Marland KitchenWert    -  Med Calendar 03/15/08    - Pneumovax February 25, 2009     - Td February 25, 2009    - CPX  February 25, 2009   Vital Signs:  Patient profile:   70 year old female Weight:      198 pounds O2 Sat:      97 % on Room air Temp:     98.0 degrees F oral Pulse rate:   72 / minute BP sitting:   118 / 70  (left arm) Cuff size:   large  Vitals Entered ByVernie Murders (Oct 17, 2009 9:32 AM)  O2 Flow:  Room air  Physical Exam  Additional Exam:  wt  207 May 21, 2008 > 202 September 21, 2008 > 192 February 25, 2009 > 201 July 08, 2009 > 198 Oct 17, 2009  GENERAL:  A/Ox3; pleasant & cooperative.NAD HEENT:  [place]/AT, EOM-wnl, PERRLA, EACs-clear, TMs-wnl, NOSE-mod nonspecific Turbinate edema, THROAT-clear & wnl. NECK:  Supple w/ fair ROM; no JVD; normal carotid impulses w/o bruits; no thyromegaly or nodules palpated; no lymphadenopathy. CHEST:  Clear to P & A; w/o, wheezes/ rales/ or rhonchi. HEART:  RRR, no m/r/g  heard.  pedal pulses sym reduced bilaterally ABDOMEN:  Soft & nt; nml bowel sounds; no organomegaly or masses detected. Neg CVA tenderness EXT: Warm bilat,  no calf pain, edema, clubbing, pulses intact    Impression & Recommendations:  Problem # 1:  CHRONIC RHINITIS (ICD-472.0) DDX of  difficult airways managment all start with A and  include Adherence, Ace Inhibitors, Acid Reflux, Active Sinus Disease, Alpha 1 Antitripsin deficiency, Anxiety masquerading as Airways dz,  ABPA,  allergy(esp in young), Aspiration (esp in elderly), Adverse effects of DPI,  Active smokers, plus one B  = Beta blocker use..    Adherence a major issue here,  still not using nasal steroids appropriately and may be developing mild asthma secondary to poorly controlled rhinitis. See instructions for specific recommendations   I had an extended discussion with the patient today lasting 15 to 20 minutes of a 25 minute visit on the following issues:   Each maintenance medication was reviewed in detail including most importantly the difference between maintenance prns and under what circumstances the prns are to be used.  In addition, these two groups (for which the patient should keep up with refills) were distinguished from a third group :  meds that are used only short term with the intent to complete a course of therapy and then not refill them.  The med list was then fully reconciled and reorganized to reflect this important distinction.   Problem # 2:  ACID REFLUX  DISEASE (ICD-530.81) Diet/ ppi reviewed   Problem # 3:  MORBID OBESITY (ICD-278.01)  Weight control is a matter of calorie balance which needs to be tilted in the pt's favor by eating less and exercising more.  Specifically, I recommended  exercise at a level where pt  is short of breath but not out of breath 30 minutes daily.  If not losing weight on this program, I would strongly recommend pt see a nutritionist with a food diary recorded for two weeks prior to the visit.     Medications Added to Medication List This Visit: 1)  Zyrtec Allergy 10 Mg Tabs (Cetirizine hcl) .... By mouth daily as needed 2)  Prednisone 10 Mg Tabs (Prednisone) .... 4 each am x 2days, 2x2days, 1x2days and stop  Other Orders: Est. Patient Level IV (19147)  Patient  Instructions: 1)  Change fluticasone to twice daily until better then once at bedtime 2)  I emphasized that nasal steroids have no immediate benefit in terms of improving symptoms.  To help them reached the target tissue, the patient should use Afrin two puffs every 12 hours applied one min before using the nasal steroids.  Afrin should be stopped after no more than 5 days.  If the symptoms worsen, Afrin can be restarted after 5 days off of therapy to prevent rebound congestion from overuse of Afrin.  I also emphasized that in no way are nasal steroids a concern in terms of "addiction". 3)  Change clariton to Zyrtec 10 mg at bedtime if needed 4)  Prednisone x 6 days 5)  See Tammy NP w/in 6weeks with all your medications, even over the counter meds, separated in two separate bags, the ones you take no matter what vs the ones you stop once you feel better and take only as needed.  She will generate for you a new user friendly medication calendar that will put [place] all on the same page re: your medication use.  Prescriptions: PREDNISONE 10 MG  TABS (PREDNISONE) 4 each am x 2days, 2x2days, 1x2days and stop  #14 x 0   Entered and Authorized by:   Nyoka Cowden  MD   Signed by:   Nyoka Cowden MD on 10/17/2009   Method used:   Electronically to        [facility] [id number] (retail)       [address] [place], [place]  [address]       Ph: [phone]       Fax: [phone]   RxID:   [id number]

## 2010-07-08 NOTE — Assessment & Plan Note (Signed)
Summary: NP follow up med calendar   Primary Provider/Referring Provider:  Sandrea Hughs, MD  CC:  Pt here for medication calendar.  History of Present Illness: 70 yobf  never smoker with hypertension, degenerative arthritis, and difficulty following through on instructions regarding medications and weight loss.a .   September 21, 2008 new onset  BRBPR  < 1 tbsp 4/14 resolved but c/o chronic constipation worse than usual, thinks she was straining with bm. No melena or abd pain. also c/o increase nasal stuffy x 2 week with onset of heavy pollen, nasal steroids not working.  Referred to GI  January 31, 2011cc  edema well controlled. rec work on wt  May 12, 2011ov  c/o runny nose and headaches x several wks.  She relates this to seasonal allergies.  She also states that she breaks out in hives occ after eating certain foods such as fruits.  She also c/o occ chest tightness. no better on clariton, fluctisaone.     11/25/09--Presents for follow up and med review. She had allergy flare last visit, tx w/ aggressive rhinitis prevention regimen and steroid taper. That resolved and now back to baseline.  We reviewed her med calendar list, unfortunately she did not bring her meds. Denies chest pain, dyspnea, orthopnea, hemoptysis, fever, n/v/d, edema, headache. Just joined the [facility] to loose weight.   Preventive Screening-Counseling & Management  Alcohol-Tobacco     Smoking Status: never  Allergies (verified): No Known Drug Allergies  Past History:  Past Medical History: Last updated: 10/17/2009 GERD(ICD-530.81)..............................................................................Marland KitchenRussella Dar    - EGD  04/28/2001  esophagitis with HH Chronic constipation Internal Hemorrhoids    - Colonoscopy 5/52006    - Colonoscopy ok except hem 11/20/08 MORBID OBESITY (ICD-278.01)     - Target wt  =  158  for BMI < 30  EPISODIC TENSION TYPE HEADACHE (ICD-339.11) Hypertension Degenerative arthritis   -  Arthroscopy Left knee, Dr Barry Dienes Herpes zoster-1998 with no significant residual R T 12 Hyperlipidemia     - Targe LDL < 160 (HBP only known RF)  Health Maintenance.......................................................................................Marland KitchenWert    - Med Calendar 03/15/08    - Pneumovax February 25, 2009     - Td February 25, 2009    - CPX  February 25, 2009   Past Surgical History: Last updated: 02/19/2009 Total abdominal hysterectomy and BSO  1970's Left Knee Arthroscopy-2006  Family History: Last updated: 08/07/2008 positive for hypertension and diabetes in her mother cancer of the leg in her mother does not know the type She has no full siblings does not know her father's history no gallbladder disease to her knowledge No FH of Colon Cancer:  Social History: Last updated: 08/07/2008 she denies ever smoking 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   Risk Factors: Smoking Status: never (11/28/2009)  Review of Systems      See HPI  Vital Signs:  Patient profile:   70 year old female Height:      62 inches Weight:      199 pounds BMI:     36.53 O2 Sat:      95 % on Room air Temp:     96.9 degrees F oral Pulse rate:   63 / minute BP sitting:   100 / 70  (left arm) Cuff size:   large  Vitals Entered By: Zackery Barefoot CMA (November 28, 2009 9:02 AM)  O2 Flow:  Room air CC: Pt here for medication calendar Is Patient Diabetic? No Comments Medications reviewed with patient Verified contact number and pharmacy with patient Zackery Barefoot [facility]  November 28, 2009 9:04 AM    Physical Exam  Additional Exam:  wt  207 May 21, 2008 > 202 September 21, 2008 > 192 February 25, 2009 > 201 July 08, 2009 > 198 Oct 17, 2009  GENERAL:  A/Ox3; pleasant & cooperative.NAD HEENT:  [place]/AT, EOM-wnl, PERRLA, EACs-clear, TMs-wnl, NOSE-mod nonspecific  Turbinate edema, THROAT-clear & wnl. NECK:  Supple w/ fair ROM; no JVD; normal carotid impulses w/o bruits; no thyromegaly or nodules palpated; no lymphadenopathy. CHEST:  Clear to P & A; w/o, wheezes/ rales/ or rhonchi. HEART:  RRR, no m/r/g  heard.  pedal pulses sym reduced bilaterally ABDOMEN:  Soft & nt; nml bowel sounds; no organomegaly or masses detected. Neg CVA tenderness EXT: Warm bilat,  no calf pain, edema, clubbing, pulses intact    Impression & Recommendations:  Problem # 1:  CHRONIC RHINITIS (ICD-472.0)  Recent flare now improved.  Meds reviewed with pt education and computerized med calendar completed/adjusted.     Orders: Est. Patient Level III (16109)  Problem # 2:  CONSTIPATION (ICD-564.00) may add stool softner daily  Her updated medication list for this problem includes:    Sorbitol 70 % Soln (Sorbitol) .Marland Kitchen... 1-2 tbsp at bedtime as needed for constipation  Orders: Est. Patient Level III (60454)  Patient Instructions: 1)  Follow med calendar closley and birng to each visit.  2)  follow up Dr. [name] in 3-4 months and as needed     Appended Document: med calendar update Medications Added OMEPRAZOLE 40 MG CPDR (OMEPRAZOLE) Take 1 capsule by mouth before first and last meals of the day FLUTICASONE PROPIONATE 50 MCG/ACT SUSP (FLUTICASONE PROPIONATE) 2 puffs each nostril two times a day STOOL SOFTENER 100 MG CAPS (DOCUSATE SODIUM) Take 1 capsule by mouth at bedtime ZYRTEC ALLERGY 10 MG  TABS (CETIRIZINE HCL) Take 1 tab by mouth at bedtime as needed AFRIN NASAL SPRAY 0.05 % SOLN (OXYMETAZOLINE HCL) 2 puffs twice daily before fluticasone for 5 days as needed          Clinical Lists Changes  Medications: Added new medication of OMEPRAZOLE 40 MG CPDR (OMEPRAZOLE) Take 1 capsule by mouth before first and last meals of the day Changed medication from FLUTICASONE PROPIONATE 50 MCG/ACT SUSP (FLUTICASONE PROPIONATE) 2 puffs two times a day as  needed to FLUTICASONE PROPIONATE 50 MCG/ACT SUSP (FLUTICASONE PROPIONATE) 2 puffs each nostril two times a day Added new medication of STOOL SOFTENER 100 MG CAPS (DOCUSATE SODIUM) Take 1 capsule by mouth at bedtime Changed medication from ZYRTEC ALLERGY 10 MG  TABS (CETIRIZINE HCL) By mouth daily as needed to ZYRTEC ALLERGY 10 MG  TABS (CETIRIZINE HCL) Take 1 tab by mouth at bedtime as needed Changed medication from [facility] NASAL SPRAY 0.05 % SOLN (OXYMETAZOLINE HCL) as directed as needed to [facility] NASAL SPRAY 0.05 % SOLN (OXYMETAZOLINE HCL) 2 puffs twice daily before fluticasone for 5 days as needed Removed medication of PREDNISONE 10 MG  TABS (PREDNISONE) 4 each am x 2days, 2x2days, 1x2days and stop

## 2010-07-09 ENCOUNTER — Encounter: Payer: Self-pay | Admitting: Adult Health

## 2010-07-10 NOTE — Letter (Signed)
Summary: [facility]  [facility]   Imported By: Lennie Odor 07/03/2010 16:11:14  _____________________________________________________________________  External Attachment:    Type:   Image     Comment:   External Document

## 2010-07-10 NOTE — Assessment & Plan Note (Signed)
Summary: Primary svc/ ext f/u abd pain   Primary Provider/Referring Provider:  Sandrea Hughs, MD  CC:  Low back pain- worse.  History of Present Illness: 29  yobf  never smoker with hypertension, degenerative arthritis, and difficulty following through on instructions regarding medications and weight loss.    11/25/09 ov  med review new med calendar  February 26, 2010 ov  f/u edema, better.  No orthopnea.  Obesity, lost 6 lbs at [facility]. no change rx  06/10/09 ov for L flank pain x one week "just like my kidney stones"  u/a  neg >   rx meloxacam,  as needed vicodin caused nause so stopped  June 13, 2010 ov for eval of L flank comes and goies ever since last kidney stone procedure but much worse since right after Christmas,  better  with meloxicam.  nauseated with vicodin, stopped  it and no more nausea, no dysuria, fever.  pain is poistional and somewhat pleurtic L flank but no sob.  rec continue meloxicam 7.5 twice daily, saw urology rx was take 15 mg daily x 2 weeks then resume 7.5 mg twice daily plus as needed oyxcodone 5 > improved.  June 25, 2010 ov Low back pain- was worse, now better.  wants to go back to work. no nausea, dysuria.  no weakness in legs, sob, cough, hematuria. Pt denies any significant sore throat, dysphagia, itching, sneezing,  nasal congestion or excess secretions,  fever, chills, sweats, unintended wt loss, pleuritic or exertional cp, hempoptysis, change in activity tolerance  orthopnea pnd or leg swelling   Current Medications (verified): 1)  Omeprazole 40 Mg Cpdr (Omeprazole) .... Take 1 Capsule By Mouth Before First and Last Meals of The Day 2)  Aspirin 81 Mg Tbec (Aspirin) .Marland Kitchen.. 1 Once Daily 3)  Aldactone 25 Mg  Tabs (Spironolactone) .... Take 1 Tablet By Mouth Two Times A Day 4)  Klor-Con M20 20 Meq  Tbcr (Potassium Chloride Crys Cr) .Marland Kitchen.. 1 By Mouth  Once Daily 5)  Trazodone Hcl 50 Mg  Tabs (Trazodone Hcl) .... 2 By Mouth At Bedtime 6)  Oscal 500/200 D-3  500-200 Mg-Unit  Tabs (Calcium-Vitamin D) .... Take 1 Tablet By Mouth Two Times A Day 7)  Premarin 0.3 Mg  Tabs (Estrogens Conjugated) .... Once Daily 8)  Fluticasone Propionate 50 Mcg/act Susp (Fluticasone Propionate) .... 2 Puffs Each Nostril Two Times A Day 9)  Stool Softener 100 Mg Caps (Docusate Sodium) .... Take 1 Capsule By Mouth At Bedtime 10)  Meloxicam 15 Mg Tabs (Meloxicam) .Marland Kitchen.. 1 Once Daily X 14 Days 11)  Meclizine Hcl 25 Mg Tabs (Meclizine Hcl) .Marland Kitchen.. 1 Tab Every 4 Hours As Needed For Dizziness 12)  Tylenol Ex St Arthritis Pain 500 Mg  Tabs (Acetaminophen) .... Per Bottle As Needed 13)  Zyrtec Allergy 10 Mg  Tabs (Cetirizine Hcl) .... Take 1 Tab By Mouth At Bedtime As Needed 14)  Afrin Nasal Spray 0.05 % Soln (Oxymetazoline Hcl) .... 2 Puffs Twice Daily Before Fluticasone For 5 Days As Needed 15)  Sorbitol 70 % Soln (Sorbitol) .Marland Kitchen.. 1-2 Tbsp At Bedtime As Needed For Constipation 16)  Oxycodone-Acetaminophen 5-325 Mg Tabs (Oxycodone-Acetaminophen) .Marland Kitchen.. 1 Every 4 Hrs As Needed 17)  Promethazine Hcl 25 Mg Tabs (Promethazine Hcl) .Marland Kitchen.. 1 Every 4 To 6 Hrs As Needed  Allergies (verified): 1)  ! Vicodin  Past History:  Past Medical History: GERD(ICD-530.81)..............................................................................Marland KitchenRussella Dar    - EGD  04/28/2001  esophagitis with HH Left Nephrolithiasis..............................................................................McDiarmid     -  s/p lithotripsy     - Re-eval 06/2010  tiny stone in calix not likely to be cause of chronic pain Chronic constipation Internal Hemorrhoids    - Colonoscopy 5/52006    - Colonoscopy ok except hem 11/20/08 MORBID OBESITY (ICD-278.01)     - Target wt  =  158  for BMI < 30  EPISODIC TENSION TYPE HEADACHE (ICD-339.11) Hypertension Degenerative arthritis   - Arthroscopy Left knee, Dr Barry Dienes Herpes zoster-1998 with no significant residual R T 12 Hyperlipidemia     - [place] LDL < 160 (HBP only known  RF)  Health Maintenance..........................................................................................Marland KitchenWert    - Med Calendar 03/15/08    - Pneumovax February 25, 2009     - Td February 25, 2009    - CPX  February 25, 2009   Vital Signs:  Patient profile:   70 year old female Weight:      198 pounds O2 Sat:      98 % on Room air Temp:     97.4 degrees F oral Pulse rate:   71 / minute BP sitting:   130 / 80  (left arm)  Vitals Entered By: Vernie Murders (June 25, 2010 10:03 AM)  O2 Flow:  Room air  Physical Exam  Additional Exam:  wt  207 May 21, 2008  >  > 193 February 26, 2010 >   198  June 25, 2010  GENERAL:  A/Ox3; pleasant & cooperative.NAD HEENT:  [place]/AT, EOM-wnl, PERRLA, EACs-clear, TMs-wnl, NOSE-mod nonspecific Turbinate edema, THROAT-clear & wnl. NECK:  Supple w/ fair ROM; no JVD; normal carotid impulses w/o bruits; no thyromegaly or nodules palpated; no lymphadenopathy. CHEST:  Clear to P & A; w/o, wheezes/ rales/ or rhonchi. HEART:  RRR, no m/r/g  heard.  pedal pulses sym reduced bilaterally ABDOMEN:  Soft & nt; nml bowel sounds; no organomegaly or masses detected. Neg CVA tenderness EXT: Warm bilat,  no calf pain, edema, clubbing, pulses intact    Impression & Recommendations:  Problem # 1:  ABDOMINAL PAIN, LEFT UPPER QUADRANT (ICD-789.02)    Most c/w musculoskeletal pain acute on chronic and better with meloxicam >  discussed with urology dr Perley Jain confirms. if recurs off narcotics needs ortho eval for chronic back pain.  I had an extended discussion with the patient today lasting 15 to 20 minutes of a 25 minute visit on the following issues:  Each maintenance medication was reviewed in detail including most importantly the difference between maintenance and as needed and under what circumstances the prns are to be used. This was done in the context of a medication calendar review which provided the patient with a user-friendly  unambiguous mechanism for medication administration and reconciliation and provides an action plan for all active problems. It is critical that this be shown to every doctor  for modification during the office visit if necessary so the patient can use it as a working document.      Medications Added to Medication List This Visit: 1)  Meloxicam 15 Mg Tabs (Meloxicam) .Marland Kitchen.. 1 once daily x 14 days 2)  Oxycodone-acetaminophen 5-325 Mg Tabs (Oxycodone-acetaminophen) .Marland Kitchen.. 1 every 4 hrs as needed 3)  Promethazine Hcl 25 Mg Tabs (Promethazine hcl) .Marland Kitchen.. 1 every 4 to 6 hrs as needed  Other Orders: Est. Patient Level IV (95284)  Patient Instructions: 1)  See calendar for specific medication instructions and bring it back for each and every office visit for every healthcare provider you see.  Without it,  you may  not receive the best quality medical care that we feel you deserve.  2)  Try to take the oxycodone only if needed for severe pain 3)  See Tammy NP w/in 6weeks with all your medications, even over the counter meds, separated in two separate bags, the ones you take no matter what vs the ones you stop once you feel better and take only as needed.  She will generate for you a new user friendly medication calendar that will put [place] all on the same page re: your medication use.

## 2010-07-10 NOTE — Assessment & Plan Note (Signed)
Summary: Acute NP office visit - left sided pain   Primary Provider/Referring Provider:  Sandrea Hughs, MD  CC:  HA, nausea, and left side pain going up to under the left breast x3-4days - denies f/c/s.  History of Present Illness: 70  yobf  never smoker with hypertension, degenerative arthritis, and difficulty following through on instructions regarding medications and weight loss.a .   September 21, 2008 new onset  BRBPR  < 1 tbsp 4/14 resolved but c/o chronic constipation worse than usual, thinks she was straining with bm. No melena or abd pain. also c/o increase nasal stuffy x 2 week with onset of heavy pollen, nasal steroids not working.  Referred to GI  January 31, 2011cc  edema well controlled. rec work on wt  May 12, 2011ov  c/o runny nose and headaches x several wks.  She relates this to seasonal allergies.  She also states that she breaks out in hives occ after eating certain foods such as fruits.  She also c/o occ chest tightness. no better on clariton, fluctisaone.     11/25/09 ov  med review new med calendar  February 26, 2010 ov  f/u edema, better.  No orthopnea.  Obesity, lost 6 lbs   June 10, 2010-Presents for an acute office visit. Complains of HA, nausea, left side pain going up to under the left breast x3-4days. Very painful to move, bra hurts it, burns along left lateral ribs/back for last 4-5 days. Denies rash, vomitting or urinary symptoms. No new meds. Denies chest pain, dyspnea, orthopnea, hemoptysis, fever, /v/d, edema, headache.   Medications Prior to Update: 1)  Omeprazole 40 Mg Cpdr (Omeprazole) .... Take 1 Capsule By Mouth Before First and Last Meals of The Day 2)  Aspirin 81 Mg Tbec (Aspirin) .Marland Kitchen.. 1 Once Daily 3)  Aldactone 25 Mg  Tabs (Spironolactone) .... Take 1 Tablet By Mouth Two Times A Day 4)  Klor-Con M20 20 Meq  Tbcr (Potassium Chloride Crys Cr) .Marland Kitchen.. 1 By Mouth  Once Daily 5)  Trazodone Hcl 50 Mg  Tabs (Trazodone Hcl) .... 2 By Mouth At  Bedtime 6)  Oscal 500/200 D-3 500-200 Mg-Unit  Tabs (Calcium-Vitamin D) .... Take 1 Tablet By Mouth Two Times A Day 7)  Premarin 0.3 Mg  Tabs (Estrogens Conjugated) .... Once Daily 8)  Fluticasone Propionate 50 Mcg/act Susp (Fluticasone Propionate) .... 2 Puffs Each Nostril Two Times A Day 9)  Stool Softener 100 Mg Caps (Docusate Sodium) .... Take 1 Capsule By Mouth At Bedtime 10)  Meloxicam 7.5 Mg  Tabs (Meloxicam) .Marland Kitchen.. 1 Tab Every 12 Hours As Needed 11)  Meclizine Hcl 25 Mg Tabs (Meclizine Hcl) .Marland Kitchen.. 1 Tab Every 4 Hours As Needed For Dizziness 12)  Tylenol Ex St Arthritis Pain 500 Mg  Tabs (Acetaminophen) .... Per Bottle As Needed 13)  Zyrtec Allergy 10 Mg  Tabs (Cetirizine Hcl) .... Take 1 Tab By Mouth At Bedtime As Needed 14)  Afrin Nasal Spray 0.05 % Soln (Oxymetazoline Hcl) .... 2 Puffs Twice Daily Before Fluticasone For 5 Days As Needed 15)  Sorbitol 70 % Soln (Sorbitol) .Marland Kitchen.. 1-2 Tbsp At Bedtime As Needed For Constipation  Current Medications (verified): 1)  Omeprazole 40 Mg Cpdr (Omeprazole) .... Take 1 Capsule By Mouth Before First and Last Meals of The Day 2)  Aspirin 81 Mg Tbec (Aspirin) .Marland Kitchen.. 1 Once Daily 3)  Aldactone 25 Mg  Tabs (Spironolactone) .... Take 1 Tablet By Mouth Two Times A Day 4)  Klor-Con  M20 20 Meq  Tbcr (Potassium Chloride Crys Cr) .Marland Kitchen.. 1 By Mouth  Once Daily 5)  Trazodone Hcl 50 Mg  Tabs (Trazodone Hcl) .... 2 By Mouth At Bedtime 6)  Oscal 500/200 D-3 500-200 Mg-Unit  Tabs (Calcium-Vitamin D) .... Take 1 Tablet By Mouth Two Times A Day 7)  Premarin 0.3 Mg  Tabs (Estrogens Conjugated) .... Once Daily 8)  Fluticasone Propionate 50 Mcg/act Susp (Fluticasone Propionate) .... 2 Puffs Each Nostril Two Times A Day 9)  Stool Softener 100 Mg Caps (Docusate Sodium) .... Take 1 Capsule By Mouth At Bedtime 10)  Meloxicam 7.5 Mg  Tabs (Meloxicam) .Marland Kitchen.. 1 Tab Every 12 Hours As Needed 11)  Meclizine Hcl 25 Mg Tabs (Meclizine Hcl) .Marland Kitchen.. 1 Tab Every 4 Hours As Needed For  Dizziness 12)  Tylenol Ex St Arthritis Pain 500 Mg  Tabs (Acetaminophen) .... Per Bottle As Needed 13)  Zyrtec Allergy 10 Mg  Tabs (Cetirizine Hcl) .... Take 1 Tab By Mouth At Bedtime As Needed 14)  Afrin Nasal Spray 0.05 % Soln (Oxymetazoline Hcl) .... 2 Puffs Twice Daily Before Fluticasone For 5 Days As Needed 15)  Sorbitol 70 % Soln (Sorbitol) .Marland Kitchen.. 1-2 Tbsp At Bedtime As Needed For Constipation  Allergies (verified): No Known Drug Allergies  Past History:  Past Medical History: Last updated: 02/26/2010 GERD(ICD-530.81)..............................................................................Marland KitchenRussella Dar    - EGD  04/28/2001  esophagitis with HH Chronic constipation Internal Hemorrhoids    - Colonoscopy 5/52006    - Colonoscopy ok except hem 11/20/08 MORBID OBESITY (ICD-278.01)     - Target wt  =  158  for BMI < 30  EPISODIC TENSION TYPE HEADACHE (ICD-339.11) Hypertension Degenerative arthritis   - Arthroscopy Left knee, Dr Barry Dienes Herpes zoster-1998 with no significant residual R T 12 Hyperlipidemia     - Targe LDL < 160 (HBP only known RF)  Health Maintenance..........................................................................................Marland KitchenWert    - Med Calendar 03/15/08    - Pneumovax February 25, 2009     - Td February 25, 2009    - CPX  February 25, 2009   Past Surgical History: Last updated: 02/19/2009 Total abdominal hysterectomy and BSO  1970's Left Knee Arthroscopy-2006  Family History: Last updated: 08/07/2008 positive for hypertension and diabetes in her mother cancer of the leg in her mother does not know the type She has no full siblings does not know her father's history no gallbladder disease to her knowledge No FH of Colon Cancer:  Social History: Last updated: 08/07/2008 she denies ever smoking 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   Risk Factors: Smoking Status: never (11/28/2009)  Review of Systems      See HPI  Vital Signs:  Patient profile:   70 year old female Height:      62 inches Weight:      194.25 pounds BMI:     35.66 O2 Sat:      100 % on Room air Temp:     96.9 degrees F oral Pulse rate:   64 / minute BP sitting:   136 / 92  (left arm) Cuff size:   large  Vitals Entered By: Boone Master CNA/MA (June 10, 2010 4:13 PM)  O2 Flow:  Room air  Physical Exam  Additional Exam:  wt  207 May 21, 2008  > 201 July 08, 2009 > 198 Oct 17, 2009 > 193  February 26, 2010 >>194 June 10, 2010  GENERAL:  A/Ox3; pleasant & cooperative.NAD HEENT:  [place]/AT, EOM-wnl, PERRLA, EACs-clear, TMs-wnl, NOSE-mod nonspecific Turbinate edema, THROAT-clear & wnl. NECK:  Supple w/ fair ROM; no JVD; normal carotid impulses w/o bruits; no thyromegaly or nodules palpated; no lymphadenopathy. CHEST:  Clear to P & A; w/o, wheezes/ rales/ or rhonchi., tender along left lateral ribs.  HEART:  RRR, no m/r/g  heard.  pedal pulses sym reduced bilaterally ABDOMEN:  Soft & nt; nml bowel sounds; no organomegaly or masses detected. Neg CVA tenderness, no guarding or rebound.  EXT: Warm bilat,  no calf pain, edema, clubbing, pulses intact derm: no rash     Impression & Recommendations:  Problem # 1:  ABDOMINAL PAIN, LEFT UPPER QUADRANT (ICD-789.02)  ?etiology Labs pending.  Plan:  Meloxicam 7.5mg  daily with food for back and rib pain.  Vicodin 1/2 -1 by mouth every 4 hr as needed for severe pain , may make you sleepy.  I will call with lab results.  Increase fluds Gas x w/ meals as needed  Please contact office for sooner follow up if symptoms do not improve or worsen   Orders: T-Culture, Urine ([id number]) T-Urine Microscopic ([id number]) TLB-CBC Platelet - w/Differential (85025-CBCD) TLB-BMP (Basic Metabolic Panel-BMET) (80048-METABOL) TLB-Hepatic/Liver Function Pnl  (80076-HEPATIC) TLB-Amylase (82150-AMYL) TLB-Lipase (83690-LIPASE) TLB-Sedimentation Rate (ESR) (85652-ESR) TLB-Udip ONLY (81003-UDIP) Est. Patient Level IV (87564)  Medications Added to Medication List This Visit: 1)  Vicodin 5-500 Mg Tabs (Hydrocodone-acetaminophen) .... 1/2 -1 by mouth every 4 hr as needed pain , may make you sleepy  Patient Instructions: 1)  Meloxicam 7.5mg  daily with food for back and rib pain.  2)  Vicodin 1/2 -1 by mouth every 4 hr as needed for severe pain , may make you sleepy.  3)  I will call with lab results.  4)  Increase fluds 5)  Gas x w/ meals as needed  6)  Please contact office for sooner follow up if symptoms do not improve or worsen  Prescriptions: VICODIN 5-500 MG TABS (HYDROCODONE-ACETAMINOPHEN) 1/2 -1 by mouth every 4 hr as needed pain , may make you sleepy  #20 x 0   Entered and Authorized by:   [name] NP   Signed by:   Wade Asebedo NP on 06/10/2010   Method used:   Print then Give to Patient   RxID:   [id number]

## 2010-07-10 NOTE — Letter (Signed)
Summary: Out of Work  [employer] [employer]  [address]   [place], [place] [address]   Phone: [phone]  Fax: [phone]    June 10, 2010   Employee:  Catherine Hoffman    To Whom It May Concern:   For Medical reasons, please excuse the above named employee from work for the following dates:  Start:   June 10, 2009  End:   June 16, 2009  If you need additional information, please feel free to contact our office.         Sincerely,        [name] NP

## 2010-07-10 NOTE — Assessment & Plan Note (Signed)
Summary: Primay svc/ f/u ov    Primary Provider/Referring Provider:  Sandrea Hughs, MD  CC:  Left side pain x 1 wk.  History of Present Illness: 43  yobf  never smoker with hypertension, degenerative arthritis, and difficulty following through on instructions regarding medications and weight loss.    11/25/09 ov  med review new med calendar  February 26, 2010 ov  f/u edema, better.  No orthopnea.  Obesity, lost 6 lbs at [facility] no change rx  06/10/09 ov for L flank pain x one week "just like my kidney stones"  u/a  neg >   rx meloxacam,  as needed vicodin caused nause so stopped  June 13, 2010 ov for eval of L flank comes and goies ever since last kidney stone procedure but much worse since right after Christmas,  better on with meloxicam.  nauseated with vicodin, stpped it and no more nausea, no dysuria, fever.  pain is poistional and somewhat pleurtic L flank but no sob. Pt denies any significant sore throat, dysphagia, itching, sneezing,  nasal congestion or excess secretions,  fever, chills, sweats, unintended wt loss, pleuritic or exertional cp, hempoptysis, change in activity tolerance  orthopnea pnd or leg swelling.  Current Medications (verified): 1)  Omeprazole 40 Mg Cpdr (Omeprazole) .... Take 1 Capsule By Mouth Before First and Last Meals of The Day 2)  Aspirin 81 Mg Tbec (Aspirin) .Marland Kitchen.. 1 Once Daily 3)  Aldactone 25 Mg  Tabs (Spironolactone) .... Take 1 Tablet By Mouth Two Times A Day 4)  Klor-Con M20 20 Meq  Tbcr (Potassium Chloride Crys Cr) .Marland Kitchen.. 1 By Mouth  Once Daily 5)  Trazodone Hcl 50 Mg  Tabs (Trazodone Hcl) .... 2 By Mouth At Bedtime 6)  Oscal 500/200 D-3 500-200 Mg-Unit  Tabs (Calcium-Vitamin D) .... Take 1 Tablet By Mouth Two Times A Day 7)  Premarin 0.3 Mg  Tabs (Estrogens Conjugated) .... Once Daily 8)  Fluticasone Propionate 50 Mcg/act Susp (Fluticasone Propionate) .... 2 Puffs Each Nostril Two Times A Day 9)  Stool Softener 100 Mg Caps (Docusate Sodium) ....  Take 1 Capsule By Mouth At Bedtime 10)  Meloxicam 7.5 Mg  Tabs (Meloxicam) .Marland Kitchen.. 1 Tab Every 12 Hours As Needed 11)  Meclizine Hcl 25 Mg Tabs (Meclizine Hcl) .Marland Kitchen.. 1 Tab Every 4 Hours As Needed For Dizziness 12)  Tylenol Ex St Arthritis Pain 500 Mg  Tabs (Acetaminophen) .... Per Bottle As Needed 13)  Zyrtec Allergy 10 Mg  Tabs (Cetirizine Hcl) .... Take 1 Tab By Mouth At Bedtime As Needed 14)  Afrin Nasal Spray 0.05 % Soln (Oxymetazoline Hcl) .... 2 Puffs Twice Daily Before Fluticasone For 5 Days As Needed 15)  Sorbitol 70 % Soln (Sorbitol) .Marland Kitchen.. 1-2 Tbsp At Bedtime As Needed For Constipation  Allergies (verified): 1)  ! Vicodin  Past History:  Past Medical History: GERD(ICD-530.81)..............................................................................Marland KitchenRussella Dar    - EGD  04/28/2001  esophagitis with HH Left Nephrolithiasis..............................................................................McDiarmid Chronic constipation Internal Hemorrhoids    - Colonoscopy 5/52006    - Colonoscopy ok except hem 11/20/08 MORBID OBESITY (ICD-278.01)     - Target wt  =  158  for BMI < 30  EPISODIC TENSION TYPE HEADACHE (ICD-339.11) Hypertension Degenerative arthritis   - Arthroscopy Left knee, Dr Barry Dienes Herpes zoster-1998 with no significant residual R T 12 Hyperlipidemia     - [place] LDL < 160 (HBP only known RF)  Health Maintenance..........................................................................................Marland KitchenWert    - Med Calendar 03/15/08    - Pneumovax February 25, 2009     - Td February 25, 2009    - CPX  February 25, 2009   Vital Signs:  Patient profile:   70 year old female Weight:      192 pounds O2 Sat:      97 % on Room air Temp:     97.7 degrees F oral Pulse rate:   87 / minute BP sitting:   134 / 80  (left arm)  Vitals Entered By: Vernie Murders (June 13, 2010 11:00 AM)  O2 Flow:  Room air  Physical Exam  Additional Exam:  wt  207 May 21, 2008   >  > 193 February 26, 2010 > 192 June 13, 2010  GENERAL:  A/Ox3; pleasant & cooperative.NAD HEENT:  [place]/AT, EOM-wnl, PERRLA, EACs-clear, TMs-wnl, NOSE-mod nonspecific Turbinate edema, THROAT-clear & wnl. NECK:  Supple w/ fair ROM; no JVD; normal carotid impulses w/o bruits; no thyromegaly or nodules palpated; no lymphadenopathy. CHEST:  Clear to P & A; w/o, wheezes/ rales/ or rhonchi. HEART:  RRR, no m/r/g  heard.  pedal pulses sym reduced bilaterally ABDOMEN:  Soft & nt; nml bowel sounds; no organomegaly or masses detected. Neg CVA tenderness EXT: Warm bilat,  no calf pain, edema, clubbing, pulses intact    CXR  Procedure date:  06/13/2010  Findings:       Comparison: December 29, 2006   Findings: Cardiomediastinal silhouette appears normal.  No acute pulmonary disease is noted.   IMPRESSION: No acute cardiopulmonary abnormality seen.  Impression & Recommendations:  Problem # 1:  ABDOMINAL PAIN, LEFT UPPER QUADRANT (ICD-789.02)  Most c/w musculoskeletal pain acute on chronic and better with meloxicam > plans f/u with urology though doubt this is the cause based on u/a.   Each maintenance medication was reviewed in detail including most importantly the difference between maintenance and as needed and under what circumstances the prns are to be used. This was done in the context of a medication calendar review which provided the patient with a user-friendly unambiguous mechanism for medication administration and reconciliation and provides an action plan for all active problems. It is critical that this be shown to every doctor  for modification during the office visit if necessary so the patient can use it as a working document.   Orders: Est. Patient Level III ([address])  Other Orders: T-2 View CXR (71020TC)  Patient Instructions: 1)  Return to office in 3 months, sooner if needed 2)  Take meloxicam 7.5 mg up to twice daily with meals as needed for pain  Appended Document:  Primay svc/ f/u ov  needs cpx due 3 months  Appended Document: Primay svc/ f/u ov  Pt aware, states that she will call to set this up

## 2010-07-10 NOTE — Progress Notes (Signed)
Summary: results  Phone Note Call from Patient Call back at Home Phone [phone]   Caller: Patient Call For: Junia Nygren Summary of Call: wants lab results Initial call taken by: Tivis Ringer, CNA,  June 12, 2010 8:57 AM  Follow-up for Phone Call        pt is requesting lab results from 06-10-10. please advise. Carron Curie CMA  June 12, 2010 9:36 AM   Additional Follow-up for Phone Call Additional follow up Details #1::        done  Additional Follow-up by: Jaegar Croft NP,  June 12, 2010 10:50 AM

## 2010-07-10 NOTE — Letter (Signed)
Summary: [facility]  [address]   [place], [place] [address]   Phone: [phone]  Fax: [phone]    06/25/2010  Catherine Hoffman [address] [place], [place]  [address]  Dear Ms. Bolla,     You are released to go back to work effective June 26, 2010  Because of your medical condition you could not work from Friday 06/20/2010 through June 25, 2010.     Sincerely,   Sandrea Hughs MD

## 2010-08-07 ENCOUNTER — Encounter (INDEPENDENT_AMBULATORY_CARE_PROVIDER_SITE_OTHER): Payer: BC Managed Care – PPO | Admitting: Adult Health

## 2010-08-07 ENCOUNTER — Encounter: Payer: Self-pay | Admitting: Adult Health

## 2010-08-07 DIAGNOSIS — K219 Gastro-esophageal reflux disease without esophagitis: Secondary | ICD-10-CM

## 2010-08-07 DIAGNOSIS — N200 Calculus of kidney: Secondary | ICD-10-CM

## 2010-08-08 DIAGNOSIS — N200 Calculus of kidney: Secondary | ICD-10-CM | POA: Insufficient documentation

## 2010-08-14 NOTE — Assessment & Plan Note (Addendum)
Summary: NP follow up - med calendar      Allergies Added:    Vital Signs:  Patient profile:   70 year old female Height:      62 inches Weight:      199.38 pounds BMI:     36.60 O2 Sat:      100 % on Room air Temp:     97.3 degrees F oral Pulse rate:   60 / minute BP sitting:   102 / 74  (left arm) Cuff size:   large  Vitals Entered By: Boone Master CNA/MA (August 07, 2010 9:57 AM)  O2 Flow:  Room air  Impression & Recommendations:  Problem # 1:  RENAL CALCULUS (ICD-592.0)  Continue follow up with Urology as planned fluids  pain meds as needed  Meds reviewed with pt education and computerized med calendar completed/adjusted.     Orders: Est. Patient Level III (96295)  Problem # 2:  ACID REFLUX DISEASE (ICD-530.81)  compensated on present regimen gERD diet  Her updated medication list for this problem includes:    Omeprazole 40 Mg Cpdr (Omeprazole) .Marland Kitchen... Take 1 capsule by mouth before first and last meals of the day  Orders: Est. Patient Level III (28413)   Primary Provider/Referring Provider:  Sandrea Hughs, MD  CC:  est med calendar - pt brought all meds with her today.  states premarin has been changed to estradiol and she has added a vitamin d3 1000 units..  History of Present Illness: 55  yobf  never smoker with hypertension, degenerative arthritis, and difficulty following through on instructions regarding medications and weight loss.    11/25/09 ov  med review new med calendar  February 26, 2010 ov  f/u edema, better.  No orthopnea.  Obesity, lost 6 lbs at Y. no change rx  06/10/09 ov for L flank pain x one week "just like my kidney stones"  u/a  neg >   rx meloxacam,  as needed vicodin caused nause so stopped  June 13, 2010 ov for eval of L flank comes and goies ever since last kidney stone procedure but much worse since right after Christmas,  better  with meloxicam.  nauseated with vicodin, stopped  it and no more nausea, no dysuria,  fever.  pain is poistional and somewhat pleurtic L flank but no sob.  rec continue meloxicam 7.5 twice daily, saw urology rx was take 15 mg daily x 2 weeks then resume 7.5 mg twice daily plus as needed oyxcodone 5 > improved.  June 25, 2010 ov Low back pain- was worse, now better.  wants to go back to work. no nausea, dysuria.  no weakness in legs, sob, cough, hematuria.   August 07, 2010 -Presents for follow up and med review. We reveiwed all meds and organized her meds. Last ov she has low back pain that was felt to be kidney stone related. She is doing some better. Denies chest pain, dyspnea, orthopnea, hemoptysis, fever, n/v/d, edema, headache, urinary symptoms   Patient Instructions: 1)  Follow med calendar closley and bring to each visit.  2)  follow up Dr. [name] in 1-2 months for Physical with fasitng labs.  CC: est med calendar - pt brought all meds with her today.  states premarin has been changed to estradiol and she has added a vitamin d3 1000 units. Is Patient Diabetic? No Comments Medications reviewed with patient Daytime contact number verified with patient. Boone Master CNA/MA  August 07, 2010 9:57 AM    Medications Prior to Update: 1)  Omeprazole 40 Mg Cpdr (Omeprazole) .... Take 1 Capsule By Mouth Before First and Last Meals of The Day 2)  Aspirin 81 Mg Tbec (Aspirin) .Marland Kitchen.. 1 Once Daily 3)  Aldactone 25 Mg  Tabs (Spironolactone) .... Take 1 Tablet By Mouth Two Times A Day 4)  Klor-Con M20 20 Meq  Tbcr (Potassium Chloride Crys Cr) .Marland Kitchen.. 1 By Mouth  Once Daily 5)  Trazodone Hcl 50 Mg  Tabs (Trazodone Hcl) .... 2 By Mouth At Bedtime 6)  Oscal 500/200 D-3 500-200 Mg-Unit  Tabs (Calcium-Vitamin D) .... Take 1 Tablet By Mouth Two Times A Day 7)  Premarin 0.3 Mg  Tabs (Estrogens Conjugated) .... Once Daily 8)  Fluticasone Propionate 50 Mcg/act Susp (Fluticasone Propionate) .... 2 Puffs Each Nostril Two Times A Day 9)  Stool Softener 100 Mg Caps (Docusate Sodium) ....  Take 1 Capsule By Mouth At Bedtime 10)  Meloxicam 15 Mg Tabs (Meloxicam) .Marland Kitchen.. 1 Once Daily X 14 Days 11)  Meclizine Hcl 25 Mg Tabs (Meclizine Hcl) .Marland Kitchen.. 1 Tab Every 4 Hours As Needed For Dizziness 12)  Tylenol Ex St Arthritis Pain 500 Mg  Tabs (Acetaminophen) .... Per Bottle As Needed 13)  Zyrtec Allergy 10 Mg  Tabs (Cetirizine Hcl) .... Take 1 Tab By Mouth At Bedtime As Needed 14)  Afrin Nasal Spray 0.05 % Soln (Oxymetazoline Hcl) .... 2 Puffs Twice Daily Before Fluticasone For 5 Days As Needed 15)  Sorbitol 70 % Soln (Sorbitol) .Marland Kitchen.. 1-2 Tbsp At Bedtime As Needed For Constipation 16)  Oxycodone-Acetaminophen 5-325 Mg Tabs (Oxycodone-Acetaminophen) .Marland Kitchen.. 1 Every 4 Hrs As Needed 17)  Promethazine Hcl 25 Mg Tabs (Promethazine Hcl) .Marland Kitchen.. 1 Every 4 To 6 Hrs As Needed  Allergies (verified): 1)  ! 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   Risk Factors: Smoking Status: never (11/28/2009)  Past Medical History: GERD(ICD-530.81)..............................................................................Marland KitchenRussella Dar    - EGD  04/28/2001  esophagitis with HH Left Nephrolithiasis..............................................................................McDiarmid     - s/p lithotripsy     - Re-eval 06/2010  tiny stone in calix not likely to be cause of chronic pain Chronic  constipation Internal Hemorrhoids    - Colonoscopy 5/52006    - Colonoscopy ok except hem 11/20/08 MORBID OBESITY (ICD-278.01)     - Target wt  =  158  for BMI < 30  EPISODIC TENSION TYPE HEADACHE (ICD-339.11) Hypertension Degenerative arthritis   - Arthroscopy Left knee, Dr Barry Dienes Herpes zoster-1998 with no significant residual R T 12 Hyperlipidemia     - [place] LDL < 160 (HBP only known RF)  Health Maintenance..........................................................................................Marland KitchenWert    - Med Calendar 03/15/08    - Pneumovax February 25, 2009     - Td February 25, 2009    - CPX  February 25, 2009  Complex med regimen,Meds reviewed with pt education and computerized med calendar completed 08/07/10  Review of Systems      See HPI   Physical Exam  Additional Exam:  wt  207 May 21, 2008  >  > 193 February 26, 2010 >   198  June 25, 2010 >>199 August 07, 2010  GENERAL:  A/Ox3; pleasant & cooperative.NAD HEENT:  /AT, EOM-wnl, PERRLA, EACs-clear, TMs-wnl, NOSE-mod nonspecific Turbinate edema, THROAT-clear & wnl. NECK:  Supple w/ fair ROM; no JVD; normal carotid impulses w/o bruits; no thyromegaly or nodules palpated; no lymphadenopathy. CHEST:  Clear to P & A; w/o, wheezes/ rales/ or rhonchi. HEART:  RRR, no m/r/g  heard.  pedal pulses sym reduced bilaterally ABDOMEN:  Soft & nt; nml bowel sounds; no organomegaly or masses detected. EXT: Warm bilat,  no calf pain, edema, clubbing, pulses intact    Orders Added: 1)  Est. Patient Level III [13086] Prescriptions: TRAZODONE HCL 50 MG  TABS (TRAZODONE HCL) 2 by mouth at bedtime  #68 x 3   Entered and Authorized by:   [name] NP   Signed by:   Azani Brogdon NP on 08/07/2010   Method used:   Electronically to        [id number] (retail)       [address] [place], [place]  [address]       Ph: [phone]       Fax: [phone]   RxID:    [id number]   Appended Document: med calendar update Medications Added ESTRADIOL 0.5 MG TABS (ESTRADIOL) Take 1 tablet by mouth once a day VITAMIN D3 1000 UNIT TABS (CHOLECALCIFEROL) Take 1 tablet by mouth once a day MELOXICAM 7.5 MG TABS (MELOXICAM) 1 tab every 12 hours as needed arthritis / knee pain OXYCODONE-ACETAMINOPHEN 5-325 MG TABS (OXYCODONE-ACETAMINOPHEN) 1 every 4 hrs as needed (this has tylenol in it)          Clinical Lists Changes  Medications: Removed medication of PREMARIN 0.3 MG  TABS (ESTROGENS CONJUGATED) once daily Added new medication of ESTRADIOL 0.5 MG TABS (ESTRADIOL) Take 1 tablet by mouth once a day Added new medication of VITAMIN D3 1000 UNIT TABS (CHOLECALCIFEROL) Take 1 tablet by mouth once a day Changed medication from MELOXICAM 15 MG TABS (MELOXICAM) 1 once daily x 14 days to MELOXICAM 7.5 MG TABS (MELOXICAM) 1 tab every 12 hours as needed arthritis / knee pain Changed medication from OXYCODONE-ACETAMINOPHEN 5-325 MG TABS (OXYCODONE-ACETAMINOPHEN) 1 every 4 hrs as needed to OXYCODONE-ACETAMINOPHEN 5-325 MG TABS (OXYCODONE-ACETAMINOPHEN) 1 every 4 hrs as needed (this has tylenol in it)

## 2010-09-04 ENCOUNTER — Encounter: Payer: Self-pay | Admitting: Internal Medicine

## 2010-09-08 ENCOUNTER — Ambulatory Visit: Payer: Medicare Other | Admitting: Internal Medicine

## 2010-09-25 ENCOUNTER — Other Ambulatory Visit (INDEPENDENT_AMBULATORY_CARE_PROVIDER_SITE_OTHER): Payer: Medicare Other | Admitting: Internal Medicine

## 2010-09-25 ENCOUNTER — Other Ambulatory Visit (INDEPENDENT_AMBULATORY_CARE_PROVIDER_SITE_OTHER): Payer: BC Managed Care – PPO

## 2010-09-25 ENCOUNTER — Other Ambulatory Visit: Payer: Self-pay | Admitting: Obstetrics and Gynecology

## 2010-09-25 ENCOUNTER — Ambulatory Visit (INDEPENDENT_AMBULATORY_CARE_PROVIDER_SITE_OTHER)
Admission: RE | Admit: 2010-09-25 | Discharge: 2010-09-25 | Disposition: A | Discharge status: Home / Self Care | Payer: BC Managed Care – PPO | Source: Ambulatory Visit | Attending: Internal Medicine | Admitting: Internal Medicine

## 2010-09-25 ENCOUNTER — Ambulatory Visit (INDEPENDENT_AMBULATORY_CARE_PROVIDER_SITE_OTHER): Payer: BC Managed Care – PPO | Admitting: Internal Medicine

## 2010-09-25 ENCOUNTER — Encounter: Payer: Self-pay | Admitting: Internal Medicine

## 2010-09-25 ENCOUNTER — Other Ambulatory Visit: Payer: Self-pay | Admitting: Internal Medicine

## 2010-09-25 DIAGNOSIS — I10 Essential (primary) hypertension: Secondary | ICD-10-CM

## 2010-09-25 DIAGNOSIS — Z1231 Encounter for screening mammogram for malignant neoplasm of breast: Secondary | ICD-10-CM

## 2010-09-25 DIAGNOSIS — E785 Hyperlipidemia, unspecified: Secondary | ICD-10-CM

## 2010-09-25 DIAGNOSIS — K219 Gastro-esophageal reflux disease without esophagitis: Secondary | ICD-10-CM

## 2010-09-25 DIAGNOSIS — Z0000 Encounter for general adult medical examination without abnormal findings: Secondary | ICD-10-CM

## 2010-09-25 DIAGNOSIS — E7800 Pure hypercholesterolemia, unspecified: Secondary | ICD-10-CM

## 2010-09-25 DIAGNOSIS — J310 Chronic rhinitis: Secondary | ICD-10-CM

## 2010-09-25 LAB — URINALYSIS
Ketones, ur: NEGATIVE
Specific Gravity, Urine: >=1.03 (ref 1.000–1.030)
Total Protein, Urine: NEGATIVE
Urine Glucose: NEGATIVE
Urobilinogen, UA: 0.2 (ref 0.0–1.0)
pH: 5 (ref 5.0–8.0)

## 2010-09-25 LAB — BASIC METABOLIC PANEL
BUN: 20 mg/dL (ref 6–23)
CO2: 25 mEq/L (ref 19–32)
Chloride: 107 mEq/L (ref 96–112)
GFR: 77.69 mL/min (ref >60.00)
Glucose, Bld: 83 mg/dL (ref 70–99)
Potassium: 4.1 mEq/L (ref 3.5–5.1)
Sodium: 142 mEq/L (ref 135–145)

## 2010-09-25 LAB — CBC WITH DIFFERENTIAL/PLATELET
Basophils Absolute: 0 10*3/uL (ref 0.0–0.1)
Basophils Relative: 0.6 % (ref 0.0–3.0)
Eosinophils Absolute: 0.1 10*3/uL (ref 0.0–0.7)
Lymphocytes Relative: 45.5 % (ref 12.0–46.0)
MCHC: 33.5 g/dL (ref 30.0–36.0)
Neutrophils Relative %: 45.6 % (ref 43.0–77.0)
RBC: 4.4 Mil/uL (ref 3.87–5.11)

## 2010-09-25 LAB — LIPID PANEL
HDL: 55.2 mg/dL (ref >39.00)
Total CHOL/HDL Ratio: 5
VLDL: 16.4 mg/dL (ref 0.0–40.0)

## 2010-09-25 LAB — HEPATIC FUNCTION PANEL
ALT: 17 U/L (ref 0–35)
Total Bilirubin: 0.7 mg/dL (ref 0.3–1.2)

## 2010-09-25 LAB — LDL CHOLESTEROL, DIRECT: Direct LDL: 196.2 mg/dL

## 2010-09-25 NOTE — Progress Notes (Signed)
Subjective:    Patient ID: Catherine Hoffman, female    DOB: 1941-05-20, 70 y.o.   MRN: [id number]  HPI 65 yobf never smoker with hypertension, degenerative arthritis, and difficulty following through on instructions regarding medications and weight loss.   11/25/09 ov med review new med calendar   February 26, 2010 ov f/u edema, better. No orthopnea. Obesity, lost 6 lbs at [facility]. no change rx   06/10/09 ov for L flank pain x one week "just like my kidney stones" u/a neg > rx meloxacam, as needed vicodin caused nause so stopped   June 13, 2010 ov for eval of L flank comes and goes  ever since last kidney stone procedure but much worse since right after Christmas, better with meloxicam. nauseated with vicodin, stopped it and no more nausea, no dysuria, fever. pain is poistional and somewhat pleurtic L flank but no sob. rec continue meloxicam 7.5 twice daily, saw urology rx was take 15 mg daily x 2 weeks then resume 7.5 mg twice daily plus as needed oyxcodone 5 > improved.   09/25/10 cpx/ Catherine Hoffman  No complaints.  No exercise.  Pt denies any significant sore throat, dysphagia, itching, sneezing,  nasal congestion or excess/ purulent secretions,  fever, chills, sweats, unintended wt loss, pleuritic or exertional cp, hempoptysis, orthopnea pnd or leg swelling.    Also denies any obvious fluctuation of symptoms with weather or environmental changes or other aggravating or alleviating factors.        Past Medical History:  GERD(ICD-530.81)..............................................................................Marland KitchenRussella Hoffman  - EGD 04/28/2001 esophagitis with HH  Chronic constipation  Internal Hemorrhoids  - Colonoscopy 5/ 2006  - Colonoscopy ok except hem 11/20/08  MORBID OBESITY (ICD-278.01)  - Target wt = 158 for BMI < 30  EPISODIC TENSION TYPE HEADACHE (ICD-339.11)  Hypertension  Degenerative arthritis  - Arthroscopy Left knee, Catherine Hoffman  Herpes zoster-1998 with no significant residual R T 12    Hyperlipidemia  - Tar[place] LDL < 160 (HBP only known RF)  Health Maintenance................................................................................Marland KitchenWert  - Med Calendar 03/15/08  - Pneumovax February 25, 2009  - Td February 25, 2009  - CPX 09/25/2010  - GYN Catherine Hoffman  Family History:  positive for hypertension and diabetes in her mother  cancer of the leg in her mother does not know the type  She has no full siblings does not know her father's history  no gallbladder disease to her knowledge  No FH of Colon Cancer:   Social History:  she denies ever smoking  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    Review of Systems  Constitutional: Negative for fever, chills and unexpected weight change.  HENT: Negative for ear pain, nosebleeds, congestion, sore throat, rhinorrhea, sneezing, trouble swallowing, dental problem, voice change, postnasal drip and sinus pressure.   Eyes: Negative for visual disturbance.  Respiratory: Negative for cough, choking and shortness of breath.   Cardiovascular: Negative for chest pain and leg swelling.  Gastrointestinal: Negative for vomiting, abdominal pain and diarrhea.  Genitourinary: Negative for difficulty urinating.  Musculoskeletal: Negative for arthralgias.  Skin: Negative for rash.  Neurological: Negative for tremors, syncope and headaches.  Hematological: Does not bruise/bleed easily.       Objective:   Physical Exam     wt 207 May 21, 2008 > > 193 February 26, 2010 > 198 June 25, 2010 >> 197  09/25/10 GENERAL: A/Ox3; pleasant & cooperative.NAD  HEENT: [place]/AT, EOM-wnl, PERRLA, EACs-clear, TMs-wnl, NOSE-mod nonspecific Turbinate edema, THROAT-clear & wnl.  NECK: Supple w/ fair ROM; no JVD; normal carotid impulses w/o bruits; no thyromegaly or nodules palpated; no lymphadenopathy.  CHEST: Clear to  P & A; w/o, wheezes/ rales/ or rhonchi.  HEART: RRR, no m/r/g heard. pedal pulses sym reduced bilaterally  ABDOMEN: Soft & nt; nml bowel sounds; no organomegaly or masses detected.  EXT: Warm bilat, no calf pain, edema, clubbing, pulses intact MS nl gait, no joint abnormalities Neuro:  Alert, approp with intact sensorium, no motor or cerebellar def or pathologic refluxex SKIN no rash/ lesions GYN  Per Catherine Hoffman     Assessment & Plan:

## 2010-09-25 NOTE — Patient Instructions (Signed)

## 2010-09-26 NOTE — Progress Notes (Signed)
Quick Note:  Spoke with pt and notified of results per Dr. Wert. Pt verbalized understanding and denied any questions.  ______ 

## 2010-09-28 ENCOUNTER — Encounter: Payer: Self-pay | Admitting: Internal Medicine

## 2010-09-28 DIAGNOSIS — I10 Essential (primary) hypertension: Secondary | ICD-10-CM | POA: Insufficient documentation

## 2010-09-28 NOTE — Assessment & Plan Note (Signed)
Discussed with pt  Weight control is simply a matter of calorie balance which needs to be tilted in your favor by eating less and exercising more.  To get the most out of exercise, you need to be continuously aware that you are short of breath, but never out of breath, for 30 minutes daily. As you improve, it will actually be easier for you to do the same amount of exercise  in  30 minutes so always push to the level where you are short of breath.  If this does not result in gradual weight reduction then I strongly recommend you see a nutritionist with a food diary x 2 weeks so that we can work out a negative calorie balance which is universally effective in steady weight loss programs.  Think of your calorie balance like you do your bank account where in this case you want the balance to go down so you must take in less calories than you burn up.  It's just that simple:  Hard to do, but easy to understand.  Good luck!  

## 2010-09-28 NOTE — Assessment & Plan Note (Signed)
Ok on rx     Each maintenance medication was reviewed in detail including most importantly the difference between maintenance and as needed and under what circumstances the prns are to be used.  Please see instructions for details which were reviewed in writing and the patient given a copy.  This was done in the context of a medication calendar review which provided the patient with a user-friendly unambiguous mechanism for medication administration and reconciliation and provides an action plan for all active problems. It is critical that this be shown to every doctor  for modification during the office visit if necessary so the patient can use it as a working document.  

## 2010-09-28 NOTE — Assessment & Plan Note (Signed)
Work harder on diet and ex and return in 3 months to check LDL and CRP

## 2010-09-28 NOTE — Assessment & Plan Note (Signed)
I emphasized that nasal steroids have no immediate benefit in terms of improving symptoms.  To help them reached the target tissue, the patient should use Afrin two puffs every 12 hours applied one min before using the nasal steroids.  Afrin should be stopped after no more than 5 days.  If the symptoms worsen, Afrin can be restarted after 5 days off of therapy to prevent rebound congestion from overuse of Afrin.  I also emphasized that in no way are nasal steroids a concern in terms of "addiction".  

## 2010-09-30 LAB — VITAMIN D 1,25 DIHYDROXY
Vitamin D 1, 25 (OH)2 Total: 67 pg/mL (ref 18–72)
Vitamin D3 1, 25 (OH)2: 67 pg/mL

## 2010-10-01 NOTE — Progress Notes (Signed)
Quick Note:  Spoke with pt and notified of results per Dr. Wert. Pt verbalized understanding and denied any questions.  ______ 

## 2010-10-02 ENCOUNTER — Ambulatory Visit
Admission: RE | Admit: 2010-10-02 | Discharge: 2010-10-02 | Disposition: A | Discharge status: Home / Self Care | Payer: BC Managed Care – PPO | Source: Ambulatory Visit | Attending: Obstetrics and Gynecology | Admitting: Obstetrics and Gynecology

## 2010-10-02 DIAGNOSIS — Z1231 Encounter for screening mammogram for malignant neoplasm of breast: Secondary | ICD-10-CM

## 2010-10-21 NOTE — Assessment & Plan Note (Signed)
Catherine Hoffman                             Catherine Hoffman   Catherine Hoffman, Catherine Hoffman                         MRN:          [id number]  DATE:12/29/2006                            DOB:          Oct 19, 1940    HISTORY:  A 70 year old obese black female with documented GERD by EGD  and hypertension as well as degenerative arthritis.  She misunderstood  her instructions regarding the use of meloxicam, has been using it daily  instead of p.r.n., but states her knee is generally doing better since  her previous visit.  However, she has not been able to get back to full  activity and has gained significant weight over the last year.  She went  through a rough time related to her husband's death a year ago but feels  like she is turning a corner and has a much brighter outlook.  She  denies any exertional chest pain, orthopnea, PND, or leg swelling.   Swelling.  She complains that sometimes she has swelling in her legs  toward the end of the day, but denies any orthopnea or PND.  She admits  that she is not particularly careful about salt intake.  She has no  history of any renal failure or proteinuria.   PAST MEDICAL HISTORY:  1. GERD.  Most recent GI evaluation, 2003, with a recommendation she      stay on b.i.d. PPI dosing indefinitely.  2. Hemorrhoids by most recent colonoscopy, Oct 10, 2004.  3. Hypertension.  4. History of past tension headache, no longer an issue.  5. History of herpes zoster involving the right T12 dermatome in 1998      with no significant residual.  6. Status post total abdominal hysterectomy.  7. Chronic constipation.  8. History of nonsteroidal-induced ulcer and gastritis.  9. She has DJD of the left knee, status post arthroscopy, August 21, 2004, by Dr. Barry Dienes, and chronic knee pain for which she takes      meloxicam.   MEDICATIONS:  Taken in detail on the worksheet using the calendar that  she provided that correlates, she  says, 100% with her medicines, see  patient sheet, column dated December 29, 2006 for details.   SOCIAL HISTORY:  She has never smoked.  She works as a Lexicographer.   FAMILY HISTORY:  Positive for cancer of the leg in her mother.  Not  clear what cell type.  She does not know what happened to her father.  She has no full siblings.  Mother also had hypertension and diabetes.   REVIEW OF SYSTEMS:  Taken in detail on the worksheet and negative except  as outlined above.   PHYSICAL EXAMINATION:  This is a obese, ambulatory, pleasant black  female in no acute distress, weighing 198 pounds with a height of 5 feet  2 inches which is no change in baseline.  Her weight is up 19 pounds  from a year ago.  HEENT:  Ocular exam was done but is benign.  Funduscopy really no  obvious retinal  arterial change.  Oropharynx is clear.  Nasal turbinates  are normal.  Ear canals are clear bilaterally.  NECK:  Supple without cervical adenopathy or tenderness.  Trachea is  midline.  No thyromegaly.  LUNGS:  Carotid upstrokes were brisk bilaterally.  CHEST:  Completely clear bilaterally to auscultation and percussion.  BREASTS:  Without mass or nipple or skin changes.  There were no  axillary nodes.  Regular rate and rhythm without murmur, gallop, rub.  No displacement of  PMI.  ABDOMEN:  Soft, obese, but otherwise benign.  No palpable organomegaly  or masses or tenderness.  Femoral pulses are present bilaterally, I  could not appreciate any aortic enlargement.  GENITOURINARY/URINARY/RECTAL:  Per Dr. Senaida Ores.  EXTREMITIES:  Warm without calf tenderness, cyanosis, clubbing, or  edema.  Pedal pulses were symmetric bilaterally.  NEUROLOGIC:  No focal deficits or pathologic reflexes  SKIN EXAM:  Warm and dry.  MUSCULOSKELETAL EXAM:  Revealed mild crepitus of both knees with no  swelling.   LABORATORY DATA:  CBC is normal, sed rate is 13, potassium is 2.9,  cholesterol profile reveals an LDL of 168 with an  HDL of 47, TSH is  normal.  Albumin level is 3.7, urinalysis was not obtained.   IMPRESSION:  1. Extremity swelling, most likely, is secondary to obesity plus the      effects of taking daily meloxicam.  She misunderstood my      instructions and I rewrote the meloxicam on the calendar in the      appropriate box.  That is, that she only should take it p.r.n.  If      she finds that she needs to take it every single day, or if she is      pain, it would make more sense, I believe, to see Dr. Barry Dienes for      consideration for injection and/or surgery for the right knee      because this is limiting her mobilization at this point, and      creating a positive calorie balance.  2. Morbid obesity is her primary medical problem.  It has not yet been      addressed.  She agreed to see a nutritionist today, and I reviewed      with her calorie balance issues, providing her with a target weight      of 160 pounds, although ideally she should weigh 140.  3. Hypertension is adequately controlled.  However, given the      hypokalemia that she is experiencing, I believe it would make sense      on her next visit to switch her to monotherapy with aldactone to      see if we can maintain adequate fluid balance, and, at the same      time, prevent hypokalemia.  4. Prolonged grief reaction has resolved.  The patient seems well      compensated in this regard, taking trazodone at bedtime      effectively.  5. Hyperlipidemia.  Her LDL should be ideally at 160.  She should      easily receive that target with diet and weight loss.  6. Complex medical regimen.  This patient continues to struggle with      very simple instructions, including the difference in a maintenance      and a p.r.n.  I emphasized this issue again today in writing,      including optimally how to use her p.r.n. list which is  both      unambiguous and extremely user friendly.  7. Followup.  Given the significant hypokalemia and  tendency to leg      swelling, I would like to      ask her to return for an office visit so that in writing we can      make the substitutes for the medication changes that need to be      done, and at that time will refer to nutrition.     [name]. Catherine Sires, MD, [facility]  Electronically Signed    MBW/MedQ  DD: 12/30/2006  DT: 12/30/2006  Job #: [id number]

## 2010-10-21 NOTE — Assessment & Plan Note (Signed)
[facility]                             PULMONARY OFFICE NOTE   Catherine Hoffman, Catherine Hoffman                         MRN:          [id number]  DATE:01/12/2007                            DOB:          11/26/1940    HISTORY OF PRESENT ILLNESS:  Patient is a 70 year old African American  female patient of Dr. Thurston Hole who has a known history of gastroesophageal  reflux, and hypertension, degenerative arthritis, presents today for  followup. Patient presents today for 2 week followup. Lab work revealed  that patient's potassium was low at 2.9. Patient reports that she had  ran out of her potassium pills and needs to restart them. Patient has  also had some complaints of lower extremity edema. Had been recommended  to change her meloxicam over to as needed to a p.r.n. status, due to its  potential for fluid retention. Patient has brought all of her  medications in today for review which are correct with our medication  list. Patient denies any chest pain, shortness of breath, orthopnea,  PND, or palpitations.   PAST MEDICAL HISTORY:  Reviewed.   CURRENT MEDICATIONS:  Reviewed.   PHYSICAL EXAMINATION:  Patient is pleasant female in no acute distress.  She is afebrile with stable vital signs. O2 saturation is 97% on room  air.  HEENT: Unremarkable.  NECK: Supple without cervical adenopathy. No JVD.  LUNG SOUNDS: Clear.  CARDIAC: Regular rate.  ABDOMEN: Soft and nontender.  EXTREMITIES: Warm without any calf tenderness, cyanosis, clubbing, or  edema.   IMPRESSION AND PLAN:  1. Lower extremity edema and hypokalemia. I have recommended that      patient be switched over to aldactone 25 mg twice daily.      Discontinue Maxzide. Patient will take 20 mEq of potassium today      and then start her aldactone in the morning. Patient will recheck      here in 2 weeks with Dr. [name] for follow up and BMET check.  2. Complex medication regimen. Patient's medication is  reviewed in      detail. Patient education      supplied.  The patient's computerized medication calendar was      adjusted and reviewed with this patient.      [name], NP  Electronically Signed      [name]. [name], MD, [facility]  Electronically Signed   TP/MedQ  DD: 01/13/2007  DT: 01/13/2007  Job #: [id number]

## 2010-10-21 NOTE — Assessment & Plan Note (Signed)
[facility]                             PULMONARY OFFICE NOTE   [name], Catherine Hoffman                         MRN:          [id number]  DATE:11/18/2006                            DOB:          01/07/1941    HISTORY:  A 70 year old black female with morbid obesity, complicated by  hypertension and degenerative arthritis.  She comes in today requesting  a handicap sticker for arthritis complaints, but turns out she is only  using her meloxicam maybe once a week.  She says it does help when she  takes it.  She has not followed Dr. Charlann Boxer, her orthopedist , and has not  seen me in over a year.   She denies any exertional chest pain, orthopnea, PND or leg swelling.   PHYSICAL EXAMINATION:  She is a hoarse ambulatory black female in no  acute distress.  Stable vital signs with her blood pressure 130/80.  HEENT:  Is remarkable for moderate turbinate sinuses, no cyanosis.  Oropharynx is clear.  There is no excessive postnasal drainage or  cobblestone.  NECK:  Supple without cervical adenopathy, tenderness.  Trachea is  midline.  Lung fields perfectly clear bilaterally to auscultation and percussion.  There is a regular rhythm without murmur, rub or gallop.  ABDOMEN:  Is soft, benign.  EXTREMITIES:  Warm without calf tenderness, cyanosis, clubbing or edema.  There is minimal crepitus in the knees.   IMPRESSION:  1. Degenerative joint disease.  Since she is not even using the      meloxicam more than once a week, I do not think it is appropriate      to use handicap parking here, but rather increase the meloxicam as      needed and if is not giving adequate relief see her Dr. Durene Romans, or the orthopedist of record.  The last thing she needs to do      is become less physically active since morbid obesity is her second      problem.  2. Morbid obesity. she declines referral to nutrition at present.      We'll revisit this issue at her CPX.  3.  Hypertension.  She is due a comprehensive health care evaluation      within 6 to 8 weeks and will schedule this.  In the meantime will      make every effort to lose weight which will include more activity,      not less, which would be possible  I think if she was more      aggressive without  using meloxicam as directed.  4. Hoarseness is probably multifactorial.  She does have appearance of      allergic rhinitis and is non-adherent with nasal steroids because      they cost too much.  I reviewed with her optimal strategy for      p.r.n. use of Claritin and Afrin but told her that the nasal      steroids would need to      be restarted in some  form if she is having frequent symptoms or      need for rescue therapy would be the Claritin or Afrin.     [name]. [name], MD, [facility]  Electronically Signed    MBW/MedQ  DD: 11/18/2006  DT: 11/18/2006  Job #: [id number]   cc:   Madlyn Frankel. Charlann Boxer, M.D.

## 2010-10-21 NOTE — Assessment & Plan Note (Signed)
[name]                             PULMONARY OFFICE NOTE   [name], [name]                         MRN:          [id number]  DATE:04/22/2007                            DOB:          January 27, 1941    HISTORY OF PRESENT ILLNESS:  The patient is a very pleasant 70 year old  [place] American female patient of Dr. Thurston Hole who has a known history of  hypertension who presents today for a 23-month followup.  Since last  visit, the patient reports she has been doing very well.  The patient  had had some difficulties with her blood pressure and hypokalemia.  She  was changed previously off of Aldactone over to triamterene.  Potassium  had been decreased, and she was subsequently was started on K-Dur 20 mEq  daily.  The patient's followup potassium was back to normal at 3.6.  The  patient denies any chest pain, palpitations, orthopnea, PND, or leg  swelling.   PAST MEDICAL HISTORY:  Reviewed today.   CURRENT MEDICATIONS:  Reviewed today.   PHYSICAL EXAMINATION:  GENERAL:  The patient is a pleasant female in no  acute distress.  VITAL SIGNS:  She is afebrile with stable vital signs.  HEENT:  Unremarkable.  NECK:  Supple without adenopathy.  No JVD.  LUNGS:  Lung sounds are clear.  CARDIAC:  Regular rate.  ABDOMEN:  Soft and nontender.  EXTREMITIES:  Warm without any edema.   IMPRESSION AND PLAN:  1. Hypertension.  Currently fairly well-controlled.  The patient will      continue on her present regimen and follow up with Dr. [name] in 3      months or sooner if needed.  2. Complex medication regimen.  The patient's medications are reviewed      in detail.  A computerized medication calendar was adjusted      accordingly and reviewed with the patient.  3. Health maintenance.  The patient's influenza vaccine was given      today.  The patient has been set up for a bone density scan and has      been recommended to begin Os-Cal D twice daily.   FOLLOW  UP:  The patient will return back in 3 months or sooner if  needed.      [name], NP  Electronically Signed      [name]. [name], MD, [facility]  Electronically Signed   TP/MedQ  DD: 04/22/2007  DT: 04/24/2007  Job #: [id number]

## 2010-10-21 NOTE — Assessment & Plan Note (Signed)
[facility]                             PULMONARY OFFICE NOTE   HADASSA, CERMAK                         MRN:          [id number]  DATE:01/20/2007                            DOB:          1940/08/13    HISTORY OF PRESENT ILLNESS:  Patient is a 70 year old African American  female patient of Dr. Thurston Hole who has a known history of hypertension,  gastroesophageal reflux and degenerative arthritis.  Returns today  complaining that she feels extremely weak, has some intermittent nausea,  lightheadedness and feels shaky.  Patient reports she did have a  headache 2 days ago but that has resolved.  Patient was seen in the  office last week, at that time was having some difficulties with lower  extremity edema and hypokalemia.  She was switched over from Maxzide to  aldactone.  Patient's potassium was 2.9 and had recommended to take 20  mEq of potassium, however patient did not fill this prescription.  Patient denies any chest pain, palpitations, abdominal pain, vomiting,  diarrhea, extremity weakness, slurred speech, visual changes or  difficulty swallowing.   PAST MEDICAL HISTORY:  Reviewed.   CURRENT MEDICATIONS:  Reviewed.   PHYSICAL EXAMINATION:  Patient is a pleasant female in no acute  distress.  She is afebrile.  Blood pressure recheck is 130.70  bilaterally.  O2 saturation 99% on room air, weight is at 192.  HEENT:  PERRLA.  EOMI without nystagmus.  Posterior pharynx is clear.  NECK:  Supple without cervical adenopathy.  No JVD.  LUNGS:  Sounds are clear to auscultation bilaterally.  CARDIAC:  Regular rate and rhythm.  ABDOMEN:  Soft and nontender.  No palpable hepatosplenomegaly.  EXTREMITIES:  Warm without calf tenderness, cyanosis, clubbing or edema.  NEURO:  Alert and oriented x3.  Equal strength in upper and lower  extremities.  Moves all extremities well.  Cranial nerves II-XII are  intact.  Steady gait.  No focal deficits.   IMPRESSION  AND PLAN:  Generalized weakness and mild lightheadedness,  possibly some underlying vertigo.  Patient does have meclizine which she  can use a whole tablet as needed.  Will check BMET today to evaluate  potassium level and electrolyte status and will follow up accordingly.  Patient is advised to change positions slowly and increase fluid intake.  Patient will return back to the office as scheduled in 1 week or sooner  if needed.  Patient is to call and talk  to the office if symptoms do not improve or worsen for sooner followup  or contact emergency services.      [name], NP  Electronically Signed      [name]. [name], MD, [facility]  Electronically Signed   TP/MedQ  DD: 01/20/2007  DT: 01/21/2007  Job #: [id number]

## 2010-10-21 NOTE — Assessment & Plan Note (Signed)
[facility]                             PULMONARY OFFICE NOTE   AMETHYST, GAINER                         MRN:          [id number]  DATE:02/04/2007                            DOB:          10/29/1940    HISTORY OF PRESENT ILLNESS:  A 70 year old black female here for the  purpose of medication reconciliation to follow up her hypertension,  hypokalemia and dizziness.  Her dizziness apparently started after  she was switched over the aldactone because of concerns with  hypokalemia, and she is back off of he aldactone and on  Triamterene/hydrochlorothiazide 37.5/25 one b.i.d. in addition to K-Dur  25 mg b.i.d.  She denies any recurrent dizziness, orthopnea, PND, leg  swelling, fever, chills, sweats or chest pain or dyspnea.   I attempted to do medication reconciliation, but she does not know the  names of her medications and is easily confused between maintenance and  p.r.n.  This is exactly where we stood previously.   PHYSICAL EXAMINATION:  GENERAL:  She is an ambulatory black female with  somewhat, at times, a childlike affect and attitude.  VITAL SIGNS:  She has stable vital signs.  HEENT:  Unremarkable.  Pharynx is clear.  LUNGS:  Lung fields clear bilateral to auscultation and percussion.  HEART:  Regular rhythm without murmurs, gallops, rubs.  ABDOMEN:  Soft, benign.  EXTREMITIES:  Warm without calf tenderness, cyanosis, clubbing.   IMPRESSION:  1. Hypertension is adequately controlled on her present regimen.  2. Hypokalemia.  Needs follow up.  Evidently, the patient declined      potassium check today.  Since I am not 100% sure if she actually is      taking the medicines as they are prescribed, I will elect to move      the mountain to [facility] by asking her to return with all of her      medications separated into bags exactly the way she takes them so      we can do a full medication inventory and then check her potassium      and only  then adjust her medications.   I spent almost 25 minutes with the patient today going over her  medications as I understood them, emphasizing that if we cannot do a  better job with her in terms of understanding what medications she is  taking, then it will be very difficult to provide her quality medical  care and that I would not be willing to participate in this and as her  primary care Moniqua Engebretsen of record.  I am not sure if she has got the  message.   As an example of the problem that we have had in the past, I asked her  about her use of meloxicam which she had previously considered a  maintenance, and she switched to p.r.n. for her knees.  It turns out,  that once she stopped it, she did not actually find that she needed it  and has not used it.  I tried to use that as an example to emphasize  that not only have we saved her the money on meloxicam, but also reduced  the chances that she might have an adverse affect  from that drug.  Hopefully,  I got through to her today, but I am still  not sure and will use a trust  but verify approach with this patient in the future in terms of  medication reconciliation for which she is asked to return to see her  nurse practitioner within the next 4-6 weeks.     [name]. [name], MD, [facility]  Electronically Signed    MBW/MedQ  DD: 02/04/2007  DT: 02/06/2007  Job #: [id number]

## 2010-10-24 NOTE — Assessment & Plan Note (Signed)
[facility]                             PULMONARY OFFICE NOTE   SOLINA, HERON                         MRN:          [id number]  DATE:07/16/2006                            DOB:          1940-11-25    HISTORY OF PRESENT ILLNESS:  This is a 70 year old African American  female patient of Dr. Thurston Hole who has a known history of hypertension,  hyperlipidemia, and rhinitis and reflux.  The patient presents today  complaining that blood pressure has been elevated over the last 4 days.  She has checked her blood pressure and noted it to be 160/100.  The  patient denies any visual changes, chest pain, palpitations, extremity  weakness.  The patient does complain of some nasal congestion, stuffy  nose, postnasal drip, and frontal headache.  The patient denies any  photo sensitivity, nausea, vomiting, photophobia.   PAST MEDICAL HISTORY:  Reviewed.   CURRENT MEDICATIONS:  Reviewed.   PHYSICAL EXAMINATION:  The patient is a pleasant female in no acute  distress, She is afebrile with stable vital signs.  Blood pressure  recheck 114/60 bilaterally.  HEENT:  Nasal mucosa is pale.  Conjunctivae not injected.  PERRLA.  EOMI  without nystagmus.  Posterior pharynx is clear.  TMs are normal.  NECK:  Supple without adenopathy.  LUNGS:  Sounds are clear.  CARDIAC:  Regular rate.  ABDOMEN:  Soft and nontender.  EXTREMITIES:  Warm without any edema.  NEURO:  No focal deficits detected.  Cranial nerves II-XII are intact.  Equal strength of the upper and lower extremities.  Negative pronator  drift, negative tremor, normal hand grips, steady gait.  Negative  Romberg.   IMPRESSION AND PLAN:  1. Elevated blood pressure at home.  Her blood pressure is very good      today in the office.  The patient denies taking any over the      counter products.  I recommended that she bring her machine in the      next time she has a visit or bring it by the office so we can make      sure it is correct.  2. Rhinitis.  The patient is to begin Mucinex twice daily times 7      days, Veramyst 2 puffs twice daily, and saline nasal spray as      needed.   The patient will return back with Dr. [name] as scheduled or sooner if  needed.      [name], NP  Electronically Signed      [name]. [name], MD, [facility]  Electronically Signed   TP/MedQ  DD: 07/19/2006  DT: 07/19/2006  Job #: [id number]

## 2010-12-23 ENCOUNTER — Other Ambulatory Visit: Payer: Self-pay | Admitting: Internal Medicine

## 2010-12-30 ENCOUNTER — Other Ambulatory Visit (INDEPENDENT_AMBULATORY_CARE_PROVIDER_SITE_OTHER): Payer: BC Managed Care – PPO

## 2010-12-30 ENCOUNTER — Encounter: Payer: Self-pay | Admitting: Internal Medicine

## 2010-12-30 ENCOUNTER — Ambulatory Visit (INDEPENDENT_AMBULATORY_CARE_PROVIDER_SITE_OTHER): Payer: BC Managed Care – PPO | Admitting: Internal Medicine

## 2010-12-30 VITALS — BP 110/70 | HR 68 | Temp 98.2°F | Ht 61.0 in | Wt 193.8 lb

## 2010-12-30 DIAGNOSIS — I10 Essential (primary) hypertension: Secondary | ICD-10-CM

## 2010-12-30 DIAGNOSIS — E7800 Pure hypercholesterolemia, unspecified: Secondary | ICD-10-CM

## 2010-12-30 LAB — HIGH SENSITIVITY CRP: CRP, High Sensitivity: 2.76 mg/L (ref 0.000–5.000)

## 2010-12-30 LAB — LIPID PANEL: Triglycerides: 57 mg/dL (ref 0.0–149.0)

## 2010-12-30 NOTE — Progress Notes (Signed)
  Subjective:    Patient ID: Catherine Hoffman, female    DOB: 03-30-1941, 70 y.o.   MRN: [id number]  HPI 48 yobf never smoker with hypertension, degenerative arthritis, and difficulty following through on instructions regarding medications and weight loss.   11/25/09 ov med review new med calendar   June 13, 2010 ov for eval of L flank comes and goes  ever since last kidney stone procedure but much worse since right after Christmas, better with meloxicam. nauseated with vicodin, stopped it and no more nausea, no dysuria, fever. pain is poistional and somewhat pleurtic L flank but no sob. rec continue meloxicam 7.5 twice daily, saw urology rx was take 15 mg daily x 2 weeks then resume 7.5 mg twice daily plus as needed oyxcodone 5 > improved.   09/25/10 cpx/ Catherine Hoffman  No complaints.  No exercise.  rec follow calendar, no change in meds, work on wt loss  12/30/2010 ov/Catherine Hoffman cc f/u high chol,  No tia, claudication, working on wt loss  Pt denies any significant sore throat, dysphagia, itching, sneezing,  nasal congestion or excess/ purulent secretions,  fever, chills, sweats, unintended wt loss, pleuritic or exertional cp, hempoptysis, orthopnea pnd or leg swelling.    Also denies any obvious fluctuation of symptoms with weather or environmental changes or other aggravating or alleviating factors.        Past Medical History:  GERD(ICD-530.81)..............................................................................Marland KitchenRussella Hoffman  - EGD 04/28/2001 esophagitis with HH  Chronic constipation  Internal Hemorrhoids  - Colonoscopy 5/ 2006  - Colonoscopy ok except hem 11/20/08  MORBID OBESITY (ICD-278.01)  - Target wt = 158 for BMI < 30  EPISODIC TENSION TYPE HEADACHE (ICD-339.11)  Hypertension  Degenerative arthritis  - Arthroscopy Left knee, Dr Catherine Hoffman  Herpes zoster-1998 with no significant residual R T 12  Hyperlipidemia  - Targe LDL < 160 (HBP only known RF)  Health  Maintenance................................................................................Marland KitchenWert   - Pneumovax February 25, 2009  - Td February 25, 2009  - CPX 09/25/2010  - GYN Catherine Hoffman  Family History:  positive for hypertension and diabetes in her mother  cancer of the leg in her mother does not know the type  She has no full siblings does not know her father's history  no gallbladder disease to her knowledge  No FH of Colon Cancer:   Social History:  she denies ever smoking  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          Objective:   Physical Exam     wt 207 May 21, 2008 > > 193 February 26, 2010 > 198 June 25, 2010 >> 197 09/25/10 > 12/30/2010  193 GENERAL: A/Ox3; pleasant & cooperative.NAD  HEENT: /AT, EOM-wnl, PERRLA, EACs-clear, TMs-wnl, NOSE-mod nonspecific Turbinate edema, THROAT-clear & wnl.  NECK: Supple w/ fair ROM; no JVD; normal carotid impulses w/o bruits; no thyromegaly or nodules palpated; no lymphadenopathy.  CHEST: Clear to P & A; w/o, wheezes/ rales/ or rhonchi.  HEART: RRR, no m/r/g heard. pedal pulses sym reduced bilaterally  ABDOMEN: Soft & nt; nml bowel sounds; no organomegaly or masses detected.  EXT: Warm bilat, no calf pain, edema, clubbing, pulses intact MS nl gait, no joint abnormalities      Assessment & Plan:

## 2010-12-30 NOTE — Progress Notes (Signed)
Quick Note:  Spoke with pt and notified of results per Dr. Wert. Pt verbalized understanding and denied any questions.  ______ 

## 2010-12-30 NOTE — Patient Instructions (Addendum)
See calendar for specific medication instructions and bring it back for each and every office visit for every healthcare provider you see.  Without it,  you may not receive the best quality medical care that we feel you deserve.  You will note that the calendar groups together  your maintenance  medications that are timed at particular times of the day.  Think of this as your checklist for what your doctor has instructed you to do until your next evaluation to see what benefit  there is  to staying on a consistent group of medications intended to keep you well.  The other group at the bottom is entirely up to you to use as you see fit  for specific symptoms that may arise between visits that require you to treat them on an as needed basis.  Think of this as your action plan or "what if" list.   Separating the top medications from the bottom group is fundamental to providing you adequate care going forward.  Keep working on weight loss. Weight control is simply a matter of calorie balance which needs to be tilted in your favor by eating less and exercising more.  To get the most out of exercise, you need to be continuously aware that you are short of breath, but never out of breath, for 30 minutes daily. As you improve, it will actually be easier for you to do the same amount of exercise  in  30 minutes so always push to the level where you are short of breath.  If this does not result in gradual weight reduction then I strongly recommend you see a nutritionist with a food diary x 2 weeks so that we can work out a negative calorie balance which is universally effective in steady weight loss programs.  Think of your calorie balance like you do your bank account where in this case you want the balance to go down so you must take in less calories than you burn up.  It's just that simple:  Hard to do, but easy to understand.  Good luck!   Please schedule a follow up visit in 6  months but call sooner if needed

## 2010-12-30 NOTE — Assessment & Plan Note (Signed)
Calorie balance issues reviewed 

## 2010-12-30 NOTE — Assessment & Plan Note (Signed)
I had an extended discussion with the patient today lasting 15 to 20 minutes of a 25 minute visit on the following issues:   Target LDL is 160 and she was at 161 previously so should be able to reach s meds, her stated desire  Rec diet/ wt loss and reheck in 6 months

## 2010-12-30 NOTE — Assessment & Plan Note (Signed)
Adequate control on present rx, reviewed  

## 2011-02-26 LAB — POCT CARDIAC MARKERS
Myoglobin, poc: 79
Troponin i, poc: <0.05

## 2011-02-26 LAB — I-STAT 8, (EC8 V) (CONVERTED LAB)
Bicarbonate: 29.1 — ABNORMAL HIGH
Glucose, Bld: 87
HCT: 42
Hemoglobin: 14.3
Operator id: 192351
Sodium: 140
TCO2: 30

## 2011-02-26 LAB — POCT I-STAT CREATININE: Operator id: 192351

## 2011-05-15 ENCOUNTER — Other Ambulatory Visit: Payer: Self-pay | Admitting: Internal Medicine

## 2011-05-15 MED ORDER — OMEPRAZOLE 40 MG PO CPDR: DELAYED_RELEASE_CAPSULE | ORAL | Status: DC

## 2011-06-17 ENCOUNTER — Telehealth: Payer: Self-pay | Admitting: Internal Medicine

## 2011-06-17 MED ORDER — MELOXICAM 7.5 MG PO TABS: 7.5000 mg | ORAL_TABLET | Freq: Two times a day (BID) | ORAL | Status: DC

## 2011-06-17 NOTE — Telephone Encounter (Signed)
SPOKE WITH PATIENT STATES HER KNEES ARE HURTING AND HAS RAN OUT OF HER MOBIC  REQUESTING AN RX  MOBIC 7.5MG   BID #60  DR [name] IS OUT OF OFFICE  DR CLANCE IS THIS OK TO FILL THANKS. Allergies  Allergen Reactions  . Hydrocodone-Acetaminophen     REACTION: GI upset

## 2011-06-17 NOTE — Telephone Encounter (Signed)
I do not know whether Dr. [name] or ortho is prescribing this medication Ok to call in one month worth with no refills until this can be sorted out.

## 2011-06-17 NOTE — Telephone Encounter (Signed)
Pt informed of KC's recs and medication sent to pharmacy.

## 2011-06-22 ENCOUNTER — Other Ambulatory Visit: Payer: Self-pay | Admitting: Internal Medicine

## 2011-06-29 ENCOUNTER — Encounter: Payer: Self-pay | Admitting: Internal Medicine

## 2011-06-29 ENCOUNTER — Ambulatory Visit (INDEPENDENT_AMBULATORY_CARE_PROVIDER_SITE_OTHER): Payer: BC Managed Care – PPO | Admitting: Internal Medicine

## 2011-06-29 DIAGNOSIS — I10 Essential (primary) hypertension: Secondary | ICD-10-CM | POA: Diagnosis not present

## 2011-06-29 DIAGNOSIS — E7800 Pure hypercholesterolemia, unspecified: Secondary | ICD-10-CM

## 2011-06-29 DIAGNOSIS — J310 Chronic rhinitis: Secondary | ICD-10-CM | POA: Diagnosis not present

## 2011-06-29 NOTE — Progress Notes (Signed)
  Subjective:    Patient ID: Catherine Hoffman, female    DOB: 11/04/1940, 71 y.o.   MRN: [id number]  HPI 45 yobf never smoker with hypertension, degenerative arthritis, and difficulty following through on instructions regarding medications and weight loss.      June 13, 2010 ov for eval of L flank comes and goes  ever since last kidney stone procedure but much worse since right after Christmas, better with meloxicam. nauseated with vicodin, stopped it and no more nausea, no dysuria, fever. pain is poistional and somewhat pleurtic L flank but no sob. rec continue meloxicam 7.5 twice daily, saw urology rx was take 15 mg daily x 2 weeks then resume 7.5 mg twice daily plus as needed oyxcodone 5 > improved.   09/25/10 cpx/ Catherine Hoffman  No complaints.  No exercise.  rec follow calendar, no change in meds, work on wt loss  12/30/2010 ov/Catherine Hoffman cc f/u high chol,  No tia, claudication, working on wt loss rec Use med calendar, no change rx  06/29/2011 f/u ov/Catherine Hoffman cc no  Cp, tia, claudication or limiting sob   Pt denies any significant sore throat, dysphagia, itching, sneezing,  nasal congestion or excess/ purulent secretions,  fever, chills, sweats, unintended wt loss, pleuritic or exertional cp, hempoptysis, orthopnea pnd or leg swelling.    Also denies any obvious fluctuation of symptoms with weather or environmental changes or other aggravating or alleviating factors.        Past Medical History:  GERD(ICD-530.81)..............................................................................Marland KitchenRussella Hoffman  - EGD 04/28/2001 esophagitis with HH  Chronic constipation  Internal Hemorrhoids  - Colonoscopy 5/ 2006  - Colonoscopy ok except hem 11/20/08  MORBID OBESITY (ICD-278.01)  - Target wt = 158 for BMI < 30  EPISODIC TENSION TYPE HEADACHE (ICD-339.11)  Hypertension  Degenerative arthritis  - Arthroscopy Left knee, Dr Barry Dienes  Herpes zoster-1998 with no significant residual R T 12  Hyperlipidemia  - Targe  LDL < 160 (HBP only known RF)  Health Maintenance................................................................................Marland KitchenWert   - Pneumovax February 25, 2009  - Td February 25, 2009  - CPX 09/25/2010  - GYN Catherine Hoffman   Family History:  positive for hypertension and diabetes in her mother  cancer of the leg in her mother does not know the type  She has no full siblings does not know her father's history  no gallbladder disease to her knowledge  No FH of Colon Cancer:   Social History:  she denies ever smoking  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          Objective:   Physical Exam     wt 207 May 21, 2008 > > 193 February 26, 2010 > 198 June 25, 2010 >> 197 09/25/10 > 12/30/2010  193 > 06/29/2011  192 GENERAL: A/Ox3; pleasant & cooperative.NAD  HEENT: /AT, EOM-wnl, PERRLA, EACs-clear, TMs-wnl, NOSE-mod nonspecific Turbinate edema, THROAT-clear & wnl.  NECK: Supple w/ fair ROM; no JVD; normal carotid impulses w/o bruits; no thyromegaly or nodules palpated; no lymphadenopathy.  CHEST: Clear to P & A; w/o, wheezes/ rales/ or rhonchi.  HEART: RRR, no m/r/g heard. pedal pulses sym reduced bilaterally  ABDOMEN: Soft & nt; nml bowel sounds; no organomegaly or masses detected.  EXT: Warm bilat, no calf pain, edema, clubbing, pulses intact MS nl gait, no joint abnormalities      Assessment & Plan:

## 2011-06-29 NOTE — Assessment & Plan Note (Signed)
Calorie balance reviewed.

## 2011-06-29 NOTE — Assessment & Plan Note (Signed)
Work on wt and recheck flp at cpx planned

## 2011-06-29 NOTE — Patient Instructions (Signed)
Weight control is simply a matter of calorie balance which needs to be tilted in your favor by eating less and exercising more.  To get the most out of exercise, you need to be continuously aware that you are short of breath, but never out of breath, for 30 minutes daily. As you improve, it will actually be easier for you to do the same amount of exercise  in  30 minutes so always push to the level where you are short of breath.  If this does not result in gradual weight reduction then I strongly recommend you see a nutritionist with a food diary x 2 weeks so that we can work out a negative calorie balance which is universally effective in steady weight loss programs.  Think of your calorie balance like you do your bank account where in this case you want the balance to go down so you must take in less calories than you burn up.  It's just that simple:  Hard to do, but easy to understand.  Good luck!   Return after 09/25/11 for your annual physical

## 2011-06-29 NOTE — Assessment & Plan Note (Signed)
Adequate control on present rx, reviewed  

## 2011-08-10 DIAGNOSIS — N209 Urinary calculus, unspecified: Secondary | ICD-10-CM | POA: Diagnosis not present

## 2011-08-11 ENCOUNTER — Telehealth: Payer: Self-pay | Admitting: Internal Medicine

## 2011-08-11 NOTE — Telephone Encounter (Signed)
Pt c/o pain in left side near hip .  Pt seen Dr Jacquelyne Balint yesterday and he ran tests on kidneys but suggested that pt see her Primary MD regarding this.  Pt offered appt with TP this morning at [facility] office but pt refused.  Pt wants to wait on see Dr [name]. Appt given.

## 2011-08-13 ENCOUNTER — Ambulatory Visit (INDEPENDENT_AMBULATORY_CARE_PROVIDER_SITE_OTHER): Payer: BC Managed Care – PPO | Admitting: Internal Medicine

## 2011-08-13 ENCOUNTER — Encounter: Payer: Self-pay | Admitting: Internal Medicine

## 2011-08-13 ENCOUNTER — Encounter: Payer: Self-pay | Admitting: *Deleted

## 2011-08-13 VITALS — BP 126/82 | HR 76 | Temp 97.4°F | Ht 61.0 in | Wt 190.6 lb

## 2011-08-13 DIAGNOSIS — I10 Essential (primary) hypertension: Secondary | ICD-10-CM

## 2011-08-13 DIAGNOSIS — M549 Dorsalgia, unspecified: Secondary | ICD-10-CM

## 2011-08-13 MED ORDER — TRAMADOL HCL 50 MG PO TABS: ORAL_TABLET | ORAL | Status: AC

## 2011-08-13 NOTE — Assessment & Plan Note (Signed)
Adequate control on present rx, reviewed     Each maintenance medication was reviewed in detail including most importantly the difference between maintenance and as needed and under what circumstances the prns are to be used. This was done in the context of a medication calendar review which provided the patient with a user-friendly unambiguous mechanism for medication administration and reconciliation and provides an action plan for all active problems. It is critical that this be shown to every doctor  for modification during the office visit if necessary so the patient can use it as a working document.     

## 2011-08-13 NOTE — Assessment & Plan Note (Signed)
Radicular features L5 /S1 distribution refractory to meloxicam which she's way over using if her medication hx is accurate  See instructions for specific recommendations which were reviewed directly with the patient who was given a copy with highlighter outlining the key components. Needs MRI but push back from insurance and Dr Netta Corrigan has agreed to see her 3/9 so will defer w/u and rx to him.

## 2011-08-13 NOTE — Patient Instructions (Addendum)
Please see patient coordinator before you leave today  to schedule MRI of Lumbar spine asap  Unless your orthopedic doctor can get to it first  Call us with the name of your orthopedic doctor and we will try to help expedite your evaluation  Meloxicam 7.5 mg every 12 hours for pain and if still hurting add tramadol 50 mg one every 4 hours  See Tammy NP w/in 2 weeks with all your medications, even over the counter meds, separated in two separate bags, the ones you take no matter what vs the ones you stop once you feel better and take only as needed when you feel you need them.   Tammy  will generate for you a new user friendly medication calendar that will put [place] all on the same page re: your medication use.     Without this process, it simply isn't possible to assure that we are providing  your outpatient care  with  the attention to detail we feel you deserve.   If we cannot assure that you're getting that kind of care,  then we cannot manage your problem effectively from this clinic.  Once you have seen Tammy and we are sure that we're all on the same page with your medication use she will arrange follow up with me.

## 2011-08-13 NOTE — Progress Notes (Signed)
Subjective:    Patient ID: Catherine Hoffman, female    DOB: [birth year] .   MRN: [id number]  HPI 57 yobf never smoker with hypertension, degenerative arthritis, and difficulty following through on instructions regarding medications and weight loss.      June 13, 2010 ov for eval of L flank comes and goes  ever since last kidney stone procedure but much worse since right after Christmas, better with meloxicam. nauseated with vicodin, stopped it and no more nausea, no dysuria, fever. pain is poistional and somewhat pleurtic L flank but no sob. rec continue meloxicam 7.5 twice daily, saw urology rx was take 15 mg daily x 2 weeks then resume 7.5 mg twice daily plus as needed oyxcodone 5 > improved.    12/30/2010 ov/Laconda Basich cc f/u high chol,  No tia, claudication, working on wt loss rec Use med calendar, no change rx  06/29/2011 f/u ov/Hatcher Froning cc no  Cp, tia, claudication or limiting sob rec Cal balance reviewed for wt loss  08/13/2011 f/u ov/Judea Riches cc 2 year wax and wane intermittent pain in L back/ hip into lower leg this flare started x 2 weeks and neg urology  w/u thinks she's taking meloxicam every 4 hours not following calendar at all. No numbness or weakness, no change in bowel or bladder habits. No pain with cough.  Pt denies any significant sore throat, dysphagia, itching, sneezing,  nasal congestion or excess/ purulent secretions,  fever, chills, sweats, unintended wt loss, pleuritic or exertional cp, hempoptysis, orthopnea pnd or leg swelling.    Also denies any obvious fluctuation of symptoms with weather or environmental changes or other aggravating or alleviating factors.        Past Medical History:  GERD(ICD-530.81)..............................................................................Marland KitchenRussella Dar  - EGD 04/28/2001 esophagitis with HH  Chronic constipation  Internal Hemorrhoids  - Colonoscopy 5/ 2006  - Colonoscopy ok except hem 11/20/08  MORBID OBESITY (ICD-278.01)  - Target wt  = 158 for BMI < 30  EPISODIC TENSION TYPE HEADACHE (ICD-339.11)  Hypertension  Degenerative arthritis  - Arthroscopy Left knee, Dr Barry Dienes  Radicular back/ L leg pain 08/13/2011 > [facility] > [facility] Herpes zoster-1998 with no significant residual R T 12  Hyperlipidemia  - Targe LDL < 160 (HBP only known RF)  Health Maintenance................................................................................Marland KitchenWert   - Pneumovax February 25, 2009  - Td February 25, 2009  - CPX 09/25/2010  - GYN Senaida Ores   Family History:  positive for hypertension and diabetes in her mother  cancer of the leg in her mother does not know the type  She has no full siblings does not know her father's history  no gallbladder disease to her knowledge  No FH of Colon Cancer:   Social History:  she denies ever smoking  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          Objective:   Physical Exam     wt 207 May 21, 2008 > > 193 February 26, 2010 >  197 09/25/10 > 12/30/2010  193 > 06/29/2011  192 > 08/13/2011  190 GENERAL: A/Ox3; pleasant & cooperative.NAD  HEENT: [place]/AT, EOM-wnl, PERRLA, EACs-clear, TMs-wnl, NOSE-mod nonspecific Turbinate edema, THROAT-clear & wnl.  NECK: Supple w/ fair ROM; no JVD; normal carotid impulses w/o bruits; no thyromegaly or nodules palpated; no lymphadenopathy.  CHEST: Clear to P & A; w/o, wheezes/ rales/  or rhonchi.  HEART: RRR, no m/r/g heard. pedal pulses sym reduced bilaterally  ABDOMEN: Soft & nt; nml bowel sounds; no organomegaly or masses detected.  EXT: Warm bilat, no calf pain, edema, clubbing, pulses intact MS nl gait, no joint abnormalities - worse with SLR       Assessment & Plan:

## 2011-08-14 ENCOUNTER — Other Ambulatory Visit (HOSPITAL_COMMUNITY): Payer: BC Managed Care – PPO

## 2011-08-15 DIAGNOSIS — M5126 Other intervertebral disc displacement, lumbar region: Secondary | ICD-10-CM | POA: Diagnosis not present

## 2011-08-20 ENCOUNTER — Other Ambulatory Visit: Payer: Self-pay | Admitting: *Deleted

## 2011-08-20 MED ORDER — FLUTICASONE FUROATE 27.5 MCG/SPRAY NA SUSP: 2.0000 | Freq: Every day | NASAL | Status: DC

## 2011-08-21 DIAGNOSIS — M5126 Other intervertebral disc displacement, lumbar region: Secondary | ICD-10-CM | POA: Diagnosis not present

## 2011-08-28 ENCOUNTER — Telehealth: Payer: Self-pay | Admitting: Internal Medicine

## 2011-08-28 DIAGNOSIS — M5126 Other intervertebral disc displacement, lumbar region: Secondary | ICD-10-CM | POA: Diagnosis not present

## 2011-08-28 MED ORDER — FLUTICASONE FUROATE 27.5 MCG/SPRAY NA SUSP: 2.0000 | Freq: Every day | NASAL | Status: DC

## 2011-08-28 NOTE — Telephone Encounter (Signed)
Contacted patient, sent in refill to patient's pharmacy, patient aware

## 2011-08-31 ENCOUNTER — Other Ambulatory Visit: Payer: Self-pay | Admitting: Internal Medicine

## 2011-08-31 MED ORDER — FLUTICASONE PROPIONATE 50 MCG/ACT NA SUSP: 2.0000 | Freq: Every day | NASAL | Status: DC

## 2011-09-23 DIAGNOSIS — H401290 Low-tension glaucoma, unspecified eye, stage unspecified: Secondary | ICD-10-CM | POA: Diagnosis not present

## 2011-09-23 DIAGNOSIS — H2510 Age-related nuclear cataract, unspecified eye: Secondary | ICD-10-CM | POA: Diagnosis not present

## 2011-09-28 ENCOUNTER — Ambulatory Visit (INDEPENDENT_AMBULATORY_CARE_PROVIDER_SITE_OTHER): Payer: BC Managed Care – PPO | Admitting: Internal Medicine

## 2011-09-28 ENCOUNTER — Encounter: Payer: Self-pay | Admitting: Internal Medicine

## 2011-09-28 VITALS — BP 118/68 | HR 73 | Temp 97.5°F | Ht 62.0 in | Wt 177.6 lb

## 2011-09-28 DIAGNOSIS — Z0000 Encounter for general adult medical examination without abnormal findings: Secondary | ICD-10-CM

## 2011-09-28 DIAGNOSIS — J310 Chronic rhinitis: Secondary | ICD-10-CM

## 2011-09-28 DIAGNOSIS — E7800 Pure hypercholesterolemia, unspecified: Secondary | ICD-10-CM | POA: Diagnosis not present

## 2011-09-28 DIAGNOSIS — I10 Essential (primary) hypertension: Secondary | ICD-10-CM | POA: Diagnosis not present

## 2011-09-28 NOTE — Assessment & Plan Note (Signed)
Wt Readings from Last 3 Encounters:  09/28/11 177 lb 9.6 oz (80.559 kg)  08/13/11 190 lb 9.6 oz (86.456 kg)  06/29/11 191 lb 9.6 oz (86.909 kg)   making progress toward goal of 158 to get BMI < 30  Encouraged to keep working toward goal

## 2011-09-28 NOTE — Assessment & Plan Note (Signed)
ekg s lvh/  Adequate control on present rx, reviewed   Needs to return fasting for bmet and u/a.

## 2011-09-28 NOTE — Assessment & Plan Note (Signed)
Needs to return fasting for lipid profile and TSH

## 2011-09-28 NOTE — Patient Instructions (Signed)
See calendar for specific medication instructions and bring it back for each and every office visit for every healthcare provider you see.  Without it,  you may not receive the best quality medical care that we feel you deserve.  You will note that the calendar groups together  your maintenance  medications that are timed at particular times of the day.  Think of this as your checklist for what your doctor has instructed you to do until your next evaluation to see what benefit  there is  to staying on a consistent group of medications intended to keep you well.  The other group at the bottom is entirely up to you to use as you see fit  for specific symptoms that may arise between visits that require you to treat them on an as needed basis.  Think of this as your action plan or "what if" list.   Separating the top medications from the bottom group is fundamental to providing you adequate care going forward.   Please schedule a follow up visit in 3 months but call sooner if needed with Tammy with all medications fasting for bloodwork to follow up your cholesterol and hypertension

## 2011-09-28 NOTE — Progress Notes (Signed)
Subjective:    Patient ID: Catherine Hoffman, female    DOB: [birth year] .   MRN: [id number]  HPI 4 yobf never smoker with hypertension, degenerative arthritis, and difficulty following through on instructions regarding medications and weight loss.      June 13, 2010 ov for eval of L flank comes and goes  ever since last kidney stone procedure but much worse since right after Christmas, better with meloxicam. nauseated with vicodin, stopped it and no more nausea, no dysuria, fever. pain is poistional and somewhat pleurtic L flank but no sob. rec continue meloxicam 7.5 twice daily, saw urology rx was take 15 mg daily x 2 weeks then resume 7.5 mg twice daily plus as needed oyxcodone 5 > improved.    12/30/2010 ov/Catherine Hoffman cc f/u high chol,  No tia, claudication, working on wt loss rec Use med calendar, no change rx  06/29/2011 f/u ov/Catherine Hoffman cc no  Cp, tia, claudication or limiting sob rec Cal balance reviewed for wt loss  08/13/2011 f/u ov/Catherine Hoffman cc 2 year wax and wane intermittent pain in L back/ hip into lower leg this flare started x 2 weeks and neg urology  w/u thinks she's taking meloxicam every 4 hours not following calendar at all. No numbness or weakness, no change in bowel or bladder habits. No pain with cough. Referred to [facility] rx prednisone > better   09/28/2011 f/u ov/Catherine Hoffman in for multiple medical problem f/u but not fasting as rec for cpx. Back and leg better, no need for even meloxicam, using med calendar more consistently.  Sleeping ok without nocturnal  or early am exacerbation  of respiratory  C/o's . Also denies any obvious fluctuation of symptoms with weather or environmental changes or other aggravating or alleviating factors except as outlined above   ROS  At present neg for  any significant sore throat, dysphagia, dental problems, itching, sneezing,  nasal congestion or excess/ purulent secretions, ear ache,   fever, chills, sweats, unintended wt loss, pleuritic or exertional cp,  hemoptysis, palpitations, orthopnea pnd or leg swelling.  Also denies presyncope, palpitations, heartburn, abdominal pain, anorexia, nausea, vomiting, diarrhea  or change in bowel or urinary habits, change in stools or urine, dysuria,hematuria,  rash, arthralgias, visual complaints, headache, numbness weakness or ataxia or problems with walking or coordination. No noted change in mood/affect or memory.            Past Medical History:  GERD(ICD-530.81)..............................................................................Marland KitchenRussella Dar  - EGD 04/28/2001 esophagitis with HH  Chronic constipation  Internal Hemorrhoids  - Colonoscopy 5/ 2006  - Colonoscopy ok except hem 11/20/08  MORBID OBESITY (ICD-278.01)  - Target wt = 158 for BMI < 30  EPISODIC TENSION TYPE HEADACHE (ICD-339.11)  Hypertension  Degenerative arthritis  - Arthroscopy Left knee, Dr Barry Dienes  Radicular back/ L leg pain 08/13/2011 > [facility] > [facility] Herpes zoster-1998 with no significant residual R T 12  Hyperlipidemia  - Targe LDL < 160 (HBP only known RF)  Health Maintenance................................................................................Marland KitchenWert   - Pneumovax February 25, 2009  - Td February 25, 2009  - CPX 09/25/2010  - GYN Senaida Ores   Family History:  positive for hypertension and diabetes in her mother  cancer of the leg in her mother does not know the type  She has no full siblings does not know her father's history  no gallbladder disease to her knowledge  No FH of Colon Cancer:   Social History:  she denies ever smoking  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          Objective:   Physical Exam     wt 207 May 21, 2008 > > 193 February 26, 2010 >  197 09/25/10 > 12/30/2010  193 > 06/29/2011  192 > 08/13/2011  190 > 09/28/2011  177 GENERAL:  A/Ox3; pleasant & cooperative.NAD  HEENT: [place]/AT, EOM-wnl, PERRLA, EACs-clear, TMs-wnl, NOSE-mod nonspecific Turbinate edema, THROAT-clear & wnl.  NECK: Supple w/ fair ROM; no JVD; normal carotid impulses w/o bruits; no thyromegaly or nodules palpated; no lymphadenopathy.  CHEST: Clear to P & A; w/o, wheezes/ rales/ or rhonchi.  HEART: RRR, no m/r/g heard. pedal pulses sym reduced bilaterally  ABDOMEN: Soft & nt; nml bowel sounds; no organomegaly or masses detected.  EXT: Warm bilat, no calf pain, edema, clubbing, pulses intact MS nl gait, no joint abnormalities - worse with SLR       Assessment & Plan:

## 2011-09-28 NOTE — Assessment & Plan Note (Signed)
Reviewed flonase rx    Each maintenance medication was reviewed in detail including most importantly the difference between maintenance and as needed and under what circumstances the prns are to be used. This was done in the context of a medication calendar review which provided the patient with a user-friendly unambiguous mechanism for medication administration and reconciliation and provides an action plan for all active problems. It is critical that this be shown to every doctor  for modification during the office visit if necessary so the patient can use it as a working document.

## 2011-10-08 DIAGNOSIS — Z0000 Encounter for general adult medical examination without abnormal findings: Secondary | ICD-10-CM | POA: Diagnosis not present

## 2011-10-08 DIAGNOSIS — Z124 Encounter for screening for malignant neoplasm of cervix: Secondary | ICD-10-CM | POA: Diagnosis not present

## 2011-10-22 ENCOUNTER — Other Ambulatory Visit: Payer: Self-pay | Admitting: Obstetrics and Gynecology

## 2011-10-22 DIAGNOSIS — Z1231 Encounter for screening mammogram for malignant neoplasm of breast: Secondary | ICD-10-CM

## 2011-11-16 ENCOUNTER — Ambulatory Visit: Payer: BC Managed Care – PPO

## 2011-11-17 ENCOUNTER — Telehealth: Payer: Self-pay | Admitting: Internal Medicine

## 2011-11-17 ENCOUNTER — Encounter: Payer: Self-pay | Admitting: Adult Health

## 2011-11-17 ENCOUNTER — Ambulatory Visit (INDEPENDENT_AMBULATORY_CARE_PROVIDER_SITE_OTHER): Payer: BC Managed Care – PPO | Admitting: Adult Health

## 2011-11-17 VITALS — BP 132/86 | HR 77 | Temp 97.0°F | Ht 64.0 in | Wt 191.2 lb

## 2011-11-17 DIAGNOSIS — M25512 Pain in left shoulder: Secondary | ICD-10-CM

## 2011-11-17 DIAGNOSIS — M25519 Pain in unspecified shoulder: Secondary | ICD-10-CM | POA: Diagnosis not present

## 2011-11-17 DIAGNOSIS — M1710 Unilateral primary osteoarthritis, unspecified knee: Secondary | ICD-10-CM | POA: Insufficient documentation

## 2011-11-17 MED ORDER — TRAMADOL HCL 50 MG PO TABS: 50.0000 mg | ORAL_TABLET | ORAL | Status: DC | PRN

## 2011-11-17 MED ORDER — MELOXICAM 7.5 MG PO TABS: 7.5000 mg | ORAL_TABLET | Freq: Two times a day (BID) | ORAL | Status: DC

## 2011-11-17 NOTE — Telephone Encounter (Signed)
Spoke with pt. She states having left arm and shoulder pain x 1 wk now, also feels dizzy and c/o legs cramping. Denies CP, nausea or other complaints. OV with TP scheduled for 11:30 am today. ED sooner if symptoms worsen.

## 2011-11-17 NOTE — Patient Instructions (Signed)
Take  Mobic 7.5mg  twice daily  w/ food for 1 week then As needed for joint pain/arthritis  May use Tramadol 50mg  every 4 hr as needed for pain  Alternate ice and heat to shoulder and neck as needed.  Please contact office for sooner follow up if symptoms do not improve or worsen or seek emergency care  If not better in 2  Weeks or worse will need orthopedist referral  follow up 1 month and As needed

## 2011-11-17 NOTE — Assessment & Plan Note (Signed)
Left shoulder pain/strain ? Rotator cuff tendonitis   Plan  Take  Mobic 7.5mg  twice daily  w/ food for 1 week then As needed for joint pain/arthritis  May use Tramadol 50mg  every 4 hr as needed for pain  Alternate ice and heat to shoulder and neck as needed.  Please contact office for sooner follow up if symptoms do not improve or worsen or seek emergency care  If not better in 2  Weeks or worse will need orthopedist referral  follow up 1 month and As needed

## 2011-11-17 NOTE — Progress Notes (Signed)
Subjective:    Patient ID: Catherine Hoffman, female    DOB: [birth year] .   MRN: [id number]  HPI 12 yobf never smoker with hypertension, degenerative arthritis, and difficulty following through on instructions regarding medications and weight loss.      June 13, 2010 ov for eval of L flank comes and goes  ever since last kidney stone procedure but much worse since right after Christmas, better with meloxicam. nauseated with vicodin, stopped it and no more nausea, no dysuria, fever. pain is poistional and somewhat pleurtic L flank but no sob. rec continue meloxicam 7.5 twice daily, saw urology rx was take 15 mg daily x 2 weeks then resume 7.5 mg twice daily plus as needed oyxcodone 5 > improved.    12/30/2010 ov/Wert cc f/u high chol,  No tia, claudication, working on wt loss rec Use med calendar, no change rx  06/29/2011 f/u ov/Wert cc no  Cp, tia, claudication or limiting sob rec Cal balance reviewed for wt loss  08/13/2011 f/u ov/Wert cc 2 year wax and wane intermittent pain in L back/ hip into lower leg this flare started x 2 weeks and neg urology  w/u thinks she's taking meloxicam every 4 hours not following calendar at all. No numbness or weakness, no change in bowel or bladder habits. No pain with cough. Referred to [facility] rx prednisone > better   09/28/2011 f/u ov/Wert in for multiple medical problem f/u but not fasting as rec for cpx. Back and leg better, no need for even meloxicam, using med calendar more consistently. >referral to ortho   11/17/2011 Acute OV  Complains of left arm /lef shoulder pain radiating into back of neck  For 2 weeks.  Denies known injury .  No chest pain or syncope.  No arm weakness. No rash.  Seen by ortho for low back issues last month , resolved with tx.  Took tylenol for shoulder pain , no better.               Past Medical History:  GERD(ICD-530.81)..............................................................................Marland KitchenRussella Dar  - EGD  04/28/2001 esophagitis with HH  Chronic constipation  Internal Hemorrhoids  - Colonoscopy 5/ 2006  - Colonoscopy ok except hem 11/20/08  MORBID OBESITY (ICD-278.01)  - Target wt = 158 for BMI < 30  EPISODIC TENSION TYPE HEADACHE (ICD-339.11)  Hypertension  Degenerative arthritis  - Arthroscopy Left knee, Dr Barry Dienes  Radicular back/ L leg pain 08/13/2011 > [facility] > [facility] Herpes zoster-1998 with no significant residual R T 12  Hyperlipidemia  - [place] LDL < 160 (HBP only known RF)  Health Maintenance................................................................................Marland KitchenWert   - Pneumovax February 25, 2009  - Td February 25, 2009  - CPX 09/25/2010  - GYN [facility] Ores   Family History:  positive for hypertension and diabetes in her mother  cancer of the leg in her mother does not know the type  She has no full siblings does not know her father's history  no gallbladder disease to her knowledge  No FH of Colon Cancer:   Social History:  she denies ever smoking  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          Objective:   Physical Exam     wt 207 May 21, 2008 > > 193 February 26, 2010 >  197 09/25/10 > 12/30/2010  193 >  06/29/2011  192 > 08/13/2011  190 > 09/28/2011  177>191 11/17/2011  GENERAL: A/Ox3; pleasant & cooperative.NAD  HEENT: [place]/AT,  , EACs-clear, TMs-wnl, NOSE-mod nonspecific Turbinate edema, THROAT-clear & wnl.  NECK: Supple w/ fair ROM; no JVD; normal carotid impulses w/o bruits; no thyromegaly or nodules palpated; no lymphadenopathy.  CHEST: Clear to P & A; w/o, wheezes/ rales/ or rhonchi.  HEART: RRR, no m/r/g heard. pedal pulses sym reduced bilaterally  ABDOMEN: Soft & nt; nml bowel sounds; no organomegaly or masses detected.  EXT: Warm bilat, no calf pain, edema, clubbing, pulses intact MS nl gait, no joint  abnormalities  Tender along left anterior upper arm and shoulder.  Decreased ROM of left arm, extension and abduction painful.  nml grips , decreased strength on left.  No rash.       Assessment & Plan:

## 2011-11-18 IMAGING — CR DG CHEST 2V
2 series · 2 of 2 positions shown · non-contrast
Comparison: December 29, 2006

CLINICAL DATA: Chest pain

CHEST - 2 VIEW

[view not recorded (1 of 2)]
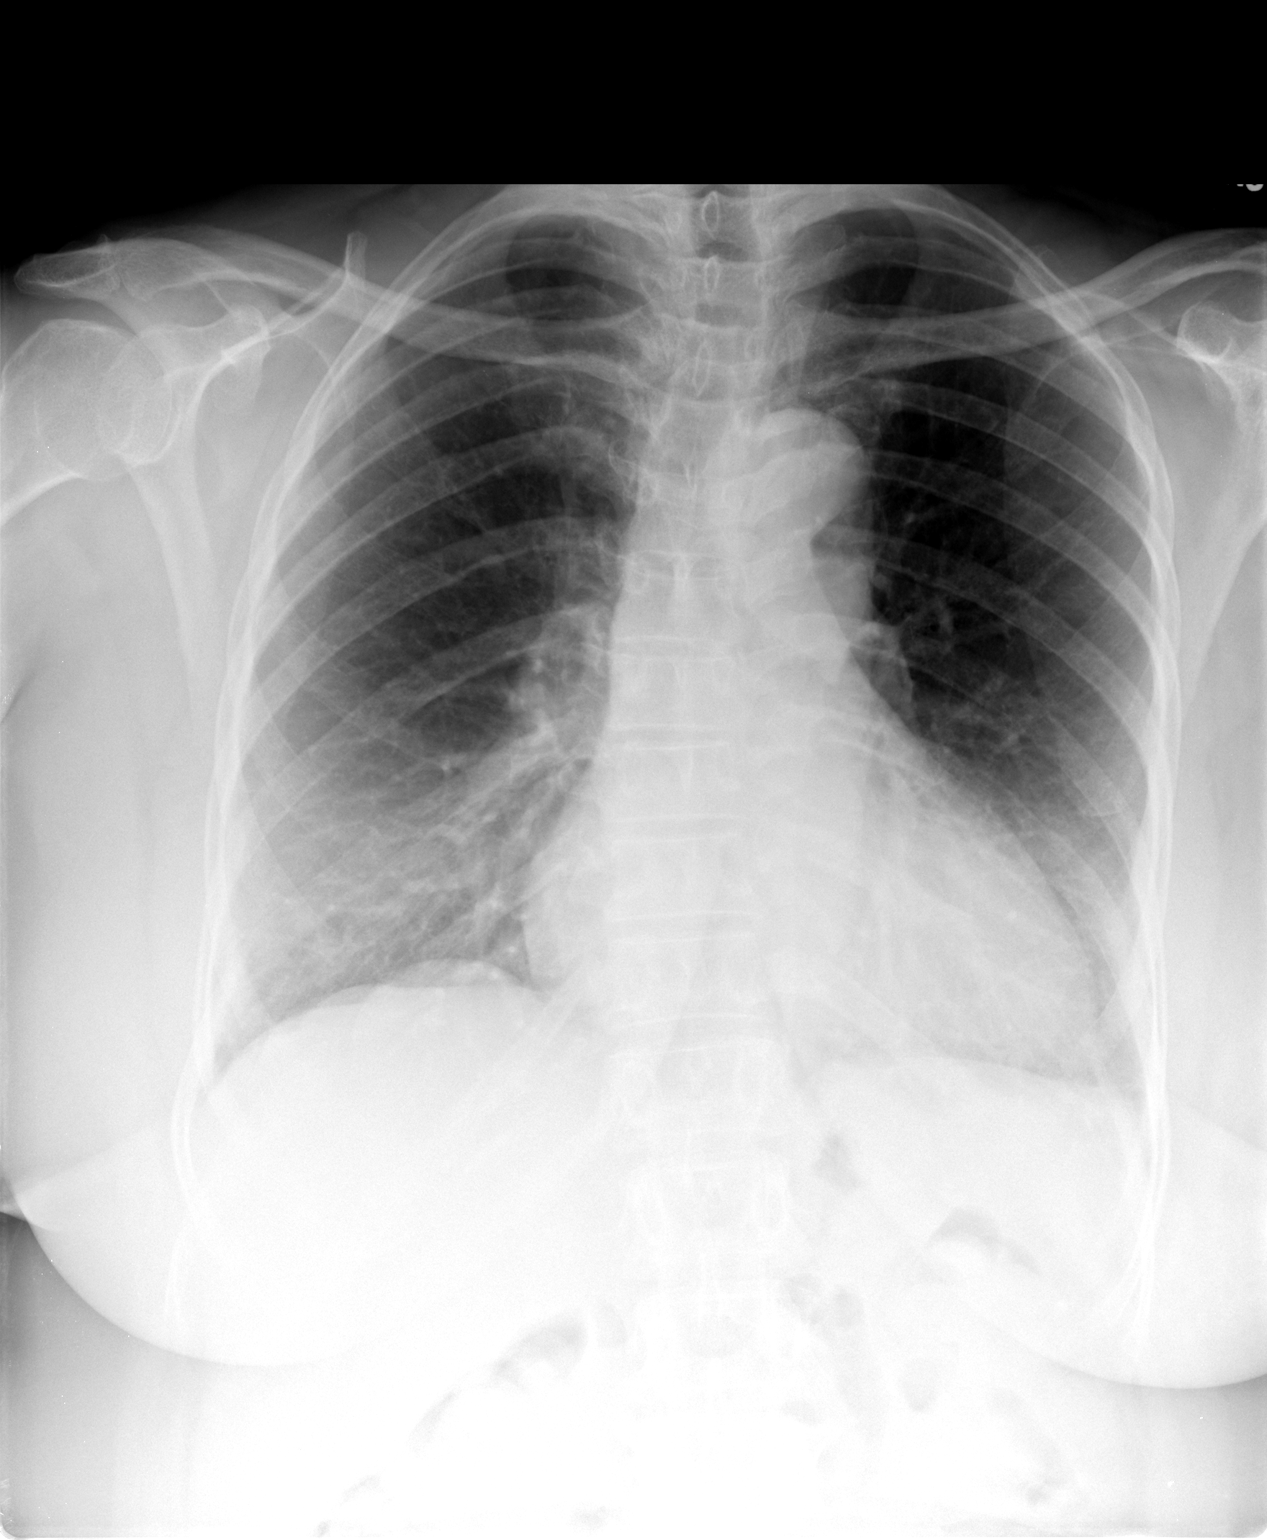

[view not recorded (2 of 2)]
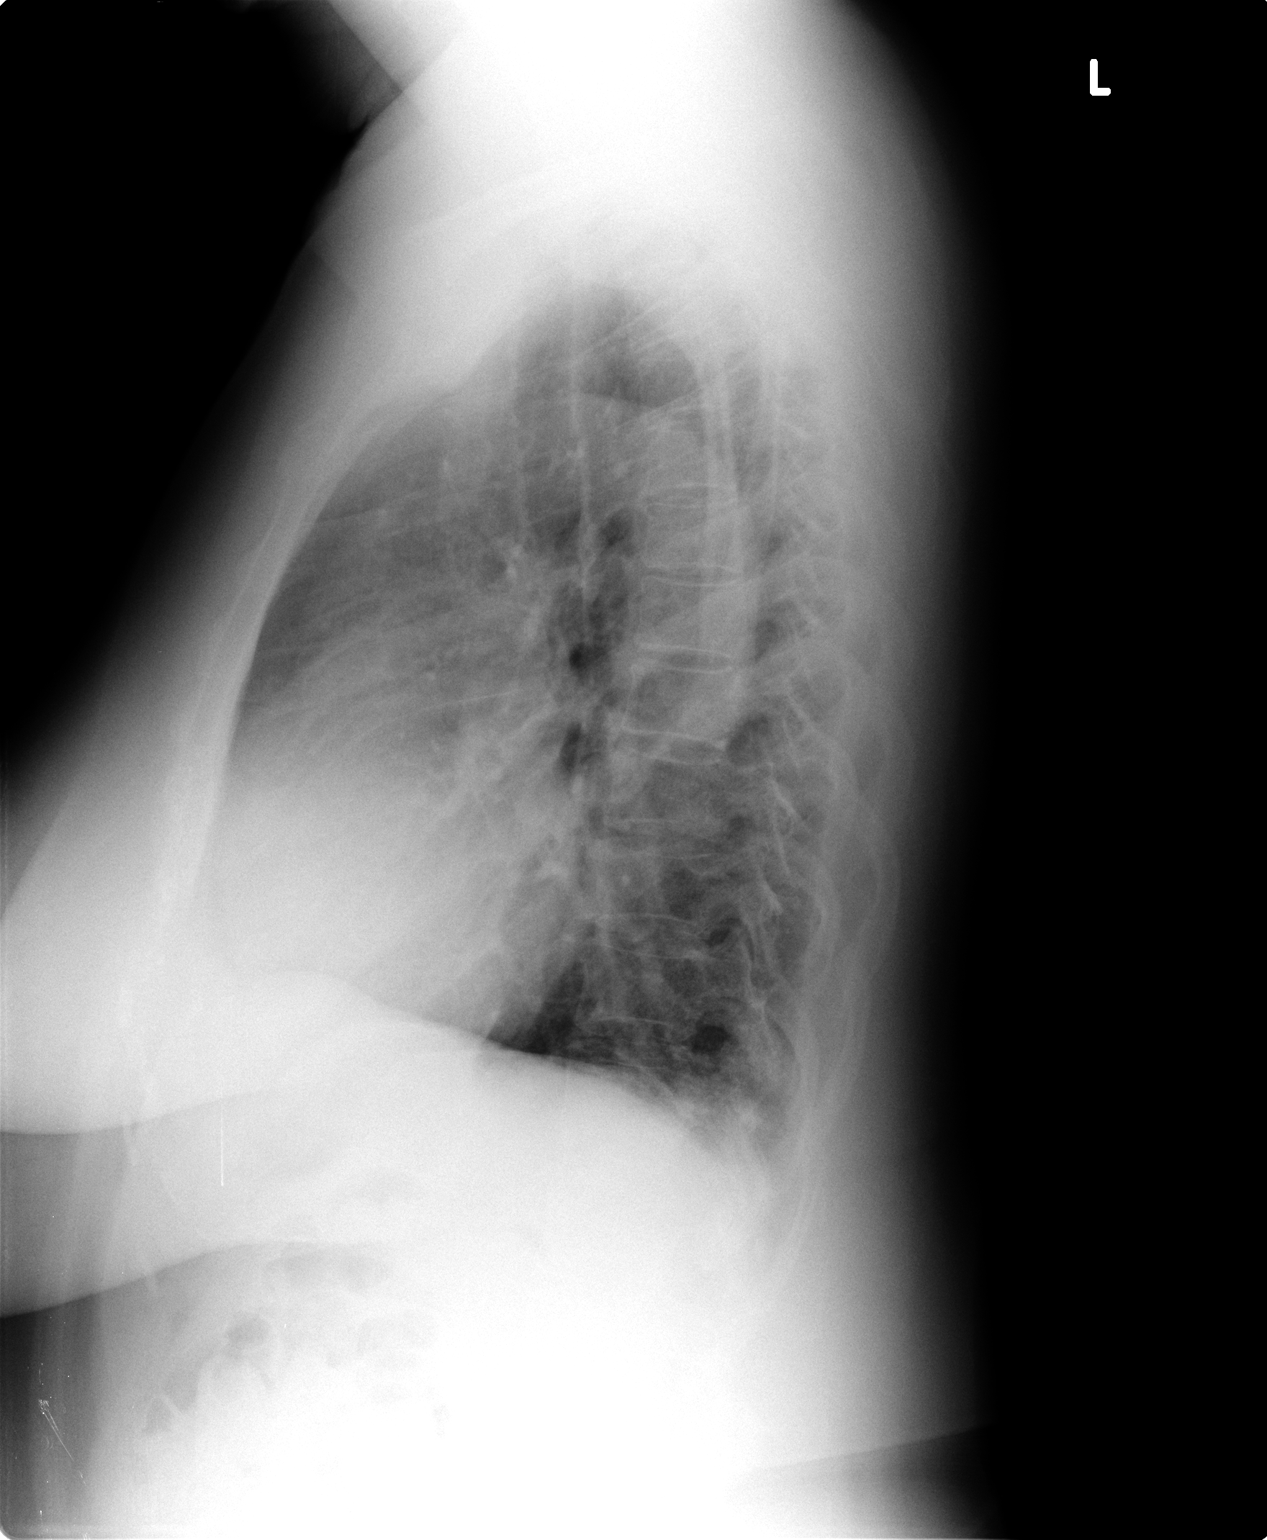

[2 of 2 positions shown; findings below may reference images not displayed]

FINDINGS: Cardiomediastinal silhouette appears normal.  No acute
pulmonary disease is noted.
IMPRESSION: No acute cardiopulmonary abnormality seen.

## 2011-11-26 ENCOUNTER — Ambulatory Visit
Admission: RE | Admit: 2011-11-26 | Discharge: 2011-11-26 | Disposition: A | Discharge status: Home / Self Care | Payer: BC Managed Care – PPO | Source: Ambulatory Visit | Attending: Obstetrics and Gynecology | Admitting: Obstetrics and Gynecology

## 2011-11-26 DIAGNOSIS — Z1231 Encounter for screening mammogram for malignant neoplasm of breast: Secondary | ICD-10-CM

## 2011-12-21 ENCOUNTER — Other Ambulatory Visit (INDEPENDENT_AMBULATORY_CARE_PROVIDER_SITE_OTHER): Payer: BC Managed Care – PPO

## 2011-12-21 ENCOUNTER — Encounter: Payer: Self-pay | Admitting: Adult Health

## 2011-12-21 ENCOUNTER — Ambulatory Visit (INDEPENDENT_AMBULATORY_CARE_PROVIDER_SITE_OTHER): Payer: BC Managed Care – PPO | Admitting: Adult Health

## 2011-12-21 VITALS — BP 112/66 | HR 76 | Temp 97.0°F | Ht 63.0 in | Wt 185.4 lb

## 2011-12-21 DIAGNOSIS — M1990 Unspecified osteoarthritis, unspecified site: Secondary | ICD-10-CM | POA: Diagnosis not present

## 2011-12-21 DIAGNOSIS — E7800 Pure hypercholesterolemia, unspecified: Secondary | ICD-10-CM

## 2011-12-21 DIAGNOSIS — I10 Essential (primary) hypertension: Secondary | ICD-10-CM | POA: Diagnosis not present

## 2011-12-21 LAB — CBC WITH DIFFERENTIAL/PLATELET
Basophils Absolute: 0 10*3/uL (ref 0.0–0.1)
Eosinophils Absolute: 0.1 10*3/uL (ref 0.0–0.7)
HCT: 41.5 % (ref 36.0–46.0)
Lymphs Abs: 3.1 10*3/uL (ref 0.7–4.0)
MCHC: 32.8 g/dL (ref 30.0–36.0)
MCV: 91.9 fl (ref 78.0–100.0)
Monocytes Absolute: 0.6 10*3/uL (ref 0.1–1.0)
Monocytes Relative: 8.3 % (ref 3.0–12.0)
Platelets: 204 10*3/uL (ref 150.0–400.0)
RDW: 13.2 % (ref 11.5–14.6)

## 2011-12-21 LAB — BASIC METABOLIC PANEL
BUN: 16 mg/dL (ref 6–23)
CO2: 25 mEq/L (ref 19–32)
Calcium: 9.5 mg/dL (ref 8.4–10.5)
Creatinine, Ser: 1.1 mg/dL (ref 0.4–1.2)

## 2011-12-21 LAB — LDL CHOLESTEROL, DIRECT: Direct LDL: 177.6 mg/dL

## 2011-12-21 LAB — LIPID PANEL
Cholesterol: 251 mg/dL — ABNORMAL HIGH (ref 0–200)
Total CHOL/HDL Ratio: 5
Triglycerides: 90 mg/dL (ref 0.0–149.0)

## 2011-12-21 MED ORDER — POTASSIUM CHLORIDE CRYS ER 20 MEQ PO TBCR: 20.0000 meq | EXTENDED_RELEASE_TABLET | Freq: Every day | ORAL | Status: DC

## 2011-12-21 MED ORDER — OMEPRAZOLE 40 MG PO CPDR: DELAYED_RELEASE_CAPSULE | ORAL | Status: DC

## 2011-12-21 MED ORDER — SPIRONOLACTONE 25 MG PO TABS: 25.0000 mg | ORAL_TABLET | Freq: Two times a day (BID) | ORAL | Status: DC

## 2011-12-21 MED ORDER — MELOXICAM 7.5 MG PO TABS: 7.5000 mg | ORAL_TABLET | Freq: Two times a day (BID) | ORAL | Status: DC | PRN

## 2011-12-21 NOTE — Patient Instructions (Addendum)
Follow med calendar closely and bring to each visit.  Take  Mobic 7.5mg  twice daily  w/ food for 1 week then As needed for joint pain/arthritis   Alternate ice and heat to hip and back  as needed.  Please contact office for sooner follow up if symptoms do not improve or worsen or seek emergency care  If not better in 2  Weeks or worse will need to Dr. Acquanetta Sit   Follow up 4 month with Dr. [name]  For physical

## 2011-12-21 NOTE — Assessment & Plan Note (Signed)
Controlled on rx  Patient's medications were reviewed today and patient education was given. Computerized medication calendar was adjusted/completed    

## 2011-12-21 NOTE — Assessment & Plan Note (Signed)
DJD flare with left hip pain   Plan  mobic Ice and heat  .Please contact office for sooner follow up if symptoms do not improve or worsen or seek emergency care

## 2011-12-21 NOTE — Assessment & Plan Note (Signed)
Discussed diet and execise Labs pending.

## 2011-12-21 NOTE — Progress Notes (Signed)
Subjective:    Patient ID: Catherine Hoffman, female    DOB: 10-Jul-1940 .   MRN: [id number]  HPI 74 yobf never smoker with hypertension, degenerative arthritis, and difficulty following through on instructions regarding medications and weight loss.    June 13, 2010 ov for eval of L flank comes and goes  ever since last kidney stone procedure but much worse since right after Christmas, better with meloxicam. nauseated with vicodin, stopped it and no more nausea, no dysuria, fever. pain is poistional and somewhat pleurtic L flank but no sob. rec continue meloxicam 7.5 twice daily, saw urology rx was take 15 mg daily x 2 weeks then resume 7.5 mg twice daily plus as needed oyxcodone 5 > improved.    12/30/2010 ov/Wert cc f/u high chol,  No tia, claudication, working on wt loss rec Use med calendar, no change rx  06/29/2011 f/u ov/Wert cc no  Cp, tia, claudication or limiting sob rec Cal balance reviewed for wt loss  08/13/2011 f/u ov/Wert cc 2 year wax and wane intermittent pain in L back/ hip into lower leg this flare started x 2 weeks and neg urology  w/u thinks she's taking meloxicam every 4 hours not following calendar at all. No numbness or weakness, no change in bowel or bladder habits. No pain with cough. Referred to [facility] rx prednisone > better   09/28/2011 f/u ov/Wert in for multiple medical problem f/u but not fasting as rec for cpx. Back and leg better, no need for even meloxicam, using med calendar more consistently. >referral to ortho   11/17/2011 Acute OV  Complains of left arm /lef shoulder pain radiating into back of neck  For 2 weeks.  Denies known injury .  No chest pain or syncope.  No arm weakness. No rash.  Seen by ortho for low back issues last month , resolved with tx.  Took tylenol for shoulder pain , no better.  >>Mobic rx   12/21/2011 Follow up and med review  Returns for follow up and med review  We reviewed all her meds and organized them into a med calendar  with pt education  It appears she is taking her meds correctly We reviewed diet, exercise and weight loss  Does complain of left hip and groin/thigh pain  Very stiff at times esp in am and after prolonged sitting.  No known injury . No otc used . No leg weakness or rash  No chest pain, dyspnea or edema.    Past Medical History:  GERD(ICD-530.81)..............................................................................Marland KitchenRussella Dar  - EGD 04/28/2001 esophagitis with HH  Chronic constipation  Internal Hemorrhoids  - Colonoscopy 5/ 2006  - Colonoscopy ok except hem 11/20/08  MORBID OBESITY (ICD-278.01)  - Target wt = 158 for BMI < 30  EPISODIC TENSION TYPE HEADACHE (ICD-339.11)  Hypertension  Degenerative arthritis  - Arthroscopy Left knee, Dr Barry Dienes  Radicular back/ L leg pain 08/13/2011 > [facility] > [facility] Herpes zoster-1998 with no significant residual R T 12  Hyperlipidemia  - Targe LDL < 160 (HBP only known RF)  Health Maintenance................................................................................Marland KitchenWert   - Pneumovax February 25, 2009  - Td February 25, 2009  - CPX 09/25/2010  - GYN [facility] O[facility] -med calendar 12/21/2011   Family History:  positive for hypertension and diabetes in her mother  cancer of the leg in her mother does not know the type  She has no full siblings does not know her father's history  no gallbladder disease to her knowledge  No FH of  Colon Cancer:   Social History:  she denies ever smoking  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   ROS: Constitutional:   No  weight loss, night sweats,  Fevers, chills, ++ fatigue, or  lassitude.  HEENT:   No headaches,  Difficulty swallowing,  Tooth/dental problems, or  Sore throat,                No sneezing, itching, ear ache, nasal congestion, post nasal drip,    CV:  No chest pain,  Orthopnea, PND, swelling in lower extremities, anasarca, dizziness, palpitations, syncope.   GI  No heartburn, indigestion, abdominal pain, nausea, vomiting, diarrhea, change in bowel habits, loss of appetite, bloody stools.   Resp:    No coughing up of blood.   No chest wall deformity  Skin: no rash or lesions.  GU: no dysuria, change in color of urine, no urgency or frequency.  No flank pain, no hematuria   MS:  ++ joint pain    Psych:  No change in mood or affect. No depression or anxiety.  No memory loss.            Objective:   Physical Exam     wt 207 May 21, 2008 > > 193 February 26, 2010 >  197 09/25/10 > 12/30/2010  193 > 06/29/2011  192 > 08/13/2011  190 > 09/28/2011  177>191 11/17/2011 >185 12/21/2011  GENERAL: A/Ox3; pleasant & cooperative.NAD  HEENT: [place]/AT,  , EACs-clear, TMs-wnl, NOSE-mod nonspecific Turbinate edema, THROAT-clear & wnl.  NECK: Supple w/ fair ROM; no JVD; normal carotid impulses w/o bruits; no thyromegaly or nodules palpated; no lymphadenopathy.  CHEST: Clear to P & A; w/o, wheezes/ rales/ or rhonchi.  HEART: RRR, no m/r/g heard. pedal pulses sym reduced bilaterally  ABDOMEN: Soft & nt; nml bowel sounds; no organomegaly or masses detected.  EXT: Warm bilat, no calf pain, edema, clubbing, pulses intact MS nl gait,Tender along left outer hip, pain with internal rotation and adduction.   Decreased ROM of left leg/hip , extension and abduction painful. nml strength, neg SLR  No rash.       Assessment & Plan:

## 2011-12-23 ENCOUNTER — Telehealth: Payer: Self-pay | Admitting: Adult Health

## 2011-12-23 LAB — VITAMIN D 25 HYDROXY (VIT D DEFICIENCY, FRACTURES): Vit D, 25-Hydroxy: 39 ng/mL (ref 30–89)

## 2011-12-23 NOTE — Progress Notes (Signed)
Quick Note:  Called and spoke with patient, informed her of results and rec as listed below per Dr. [name]. Pt verbalized understanding, had no further questions or concerns at this time. ______

## 2011-12-23 NOTE — Telephone Encounter (Signed)
I spoke with pt and she states when we called earlier she was getting her nails done and it was loud so she could not hear good about her lab results. I advised pt of results. She asked if a low chol diet sheet could be mailed to her to help her out. Per JJ send this to her box and she will mail this out to pt tomorrow. Nothing further was needed

## 2011-12-24 NOTE — Telephone Encounter (Signed)
Cholesterol control diet from Epic printed off for patient and mailed to home address.  Will sign off.

## 2011-12-28 ENCOUNTER — Encounter: Payer: BC Managed Care – PPO | Admitting: Adult Health

## 2012-01-19 DIAGNOSIS — H401290 Low-tension glaucoma, unspecified eye, stage unspecified: Secondary | ICD-10-CM | POA: Diagnosis not present

## 2012-01-19 DIAGNOSIS — H40039 Anatomical narrow angle, unspecified eye: Secondary | ICD-10-CM | POA: Diagnosis not present

## 2012-01-19 DIAGNOSIS — H2510 Age-related nuclear cataract, unspecified eye: Secondary | ICD-10-CM | POA: Diagnosis not present

## 2012-04-19 ENCOUNTER — Telehealth: Payer: Self-pay | Admitting: Internal Medicine

## 2012-04-19 NOTE — Telephone Encounter (Signed)
Attempted to call pt x 3 to make next ov per recall.  Pt never answered or returned our call.  Sent recall letter 04/19/12. Catherine Hoffman

## 2012-05-30 ENCOUNTER — Ambulatory Visit (INDEPENDENT_AMBULATORY_CARE_PROVIDER_SITE_OTHER): Payer: BC Managed Care – PPO | Admitting: Internal Medicine

## 2012-05-30 ENCOUNTER — Other Ambulatory Visit (INDEPENDENT_AMBULATORY_CARE_PROVIDER_SITE_OTHER): Payer: BC Managed Care – PPO

## 2012-05-30 ENCOUNTER — Encounter: Payer: Self-pay | Admitting: Internal Medicine

## 2012-05-30 VITALS — BP 112/70 | HR 66 | Temp 97.4°F | Ht 63.0 in | Wt 188.0 lb

## 2012-05-30 DIAGNOSIS — Z0000 Encounter for general adult medical examination without abnormal findings: Secondary | ICD-10-CM | POA: Diagnosis not present

## 2012-05-30 DIAGNOSIS — E7800 Pure hypercholesterolemia, unspecified: Secondary | ICD-10-CM | POA: Diagnosis not present

## 2012-05-30 DIAGNOSIS — Z23 Encounter for immunization: Secondary | ICD-10-CM | POA: Diagnosis not present

## 2012-05-30 DIAGNOSIS — M549 Dorsalgia, unspecified: Secondary | ICD-10-CM

## 2012-05-30 DIAGNOSIS — I10 Essential (primary) hypertension: Secondary | ICD-10-CM

## 2012-05-30 LAB — BASIC METABOLIC PANEL
BUN: 25 mg/dL — ABNORMAL HIGH (ref 6–23)
Calcium: 9.6 mg/dL (ref 8.4–10.5)
GFR: 68.64 mL/min (ref >60.00)
Potassium: 4 mEq/L (ref 3.5–5.1)
Sodium: 140 mEq/L (ref 135–145)

## 2012-05-30 LAB — URINALYSIS, ROUTINE W REFLEX MICROSCOPIC
Nitrite: NEGATIVE
Total Protein, Urine: NEGATIVE

## 2012-05-30 LAB — CBC WITH DIFFERENTIAL/PLATELET
Basophils Absolute: 0 10*3/uL (ref 0.0–0.1)
Eosinophils Relative: 3.1 % (ref 0.0–5.0)
HCT: 37.1 % (ref 36.0–46.0)
Hemoglobin: 12.2 g/dL (ref 12.0–15.0)
Lymphocytes Relative: 50.9 % — ABNORMAL HIGH (ref 12.0–46.0)
Lymphs Abs: 2.3 10*3/uL (ref 0.7–4.0)
Monocytes Relative: 8.2 % (ref 3.0–12.0)
Platelets: 195 10*3/uL (ref 150.0–400.0)
RDW: 13.1 % (ref 11.5–14.6)
WBC: 4.6 10*3/uL (ref 4.5–10.5)

## 2012-05-30 LAB — HEPATIC FUNCTION PANEL
ALT: 17 U/L (ref 0–35)
AST: 19 U/L (ref 0–37)
Alkaline Phosphatase: 74 U/L (ref 39–117)
Total Bilirubin: 0.7 mg/dL (ref 0.3–1.2)

## 2012-05-30 LAB — LIPID PANEL
HDL: 54.7 mg/dL (ref >39.00)
Total CHOL/HDL Ratio: 4
VLDL: 9.2 mg/dL (ref 0.0–40.0)

## 2012-05-30 LAB — TSH: TSH: 1.19 u[IU]/mL (ref 0.35–5.50)

## 2012-05-30 LAB — LDL CHOLESTEROL, DIRECT: Direct LDL: 150.2 mg/dL

## 2012-05-30 MED ORDER — OXYCODONE-ACETAMINOPHEN 5-325 MG PO TABS: 1.0000 | ORAL_TABLET | ORAL | Status: DC | PRN

## 2012-05-30 MED ORDER — TRAMADOL HCL 50 MG PO TABS: 50.0000 mg | ORAL_TABLET | ORAL | Status: DC | PRN

## 2012-05-30 MED ORDER — PREDNISONE (PAK) 10 MG PO TABS: ORAL_TABLET | ORAL | Status: DC

## 2012-05-30 NOTE — Progress Notes (Signed)
Subjective:    Patient ID: Catherine Hoffman, female    DOB: 15-Jun-1940 .   MRN: [id number]  HPI 57 yobf never smoker with hypertension, degenerative arthritis, and difficulty following through on instructions regarding medications and weight loss.     06/29/2011 f/u ov/Catherine Hoffman cc no  Cp, tia, claudication or limiting sob rec Cal balance reviewed for wt loss  08/13/2011 f/u ov/Catherine Hoffman cc 2 year wax and wane intermittent pain in L back/ hip into lower leg this flare started x 2 weeks and neg urology  w/u  Rec:  she's taking meloxicam every 4 hours not following calendar at all. No numbness or weakness, no change in bowel or bladder habits. No pain with cough. Referred to [facility] rx prednisone > better   09/28/2011 f/u ov/Catherine Hoffman in for multiple medical problem f/u but not fasting as rec for cpx. Back and leg better, no need for even meloxicam, using med calendar more consistently. >referral to ortho   11/17/2011 Acute OV  Complains of left arm /lef shoulder pain radiating into back of neck  For 2 weeks.  Denies known injury .  No chest pain or syncope.  No arm weakness. No rash.  Seen by ortho for low back issues last month , resolved with tx.  Took tylenol for shoulder pain , no better.  >>Mobic rx   12/21/2011 Follow up and med review  Returns for follow up and med review  We reviewed all her meds and organized them into a med calendar with pt education  It appears she is taking her meds correctly We reviewed diet, exercise and weight loss  Does complain of left hip and groin/thigh pain  Very stiff at times esp in am and after prolonged sitting.  No known injury . No otc used . No leg weakness or rash  No chest pain, dyspnea or edema.  rec Follow med calendar closely and bring to each visit.  Take  Mobic 7.5mg  twice daily  w/ food for 1 week then As needed for joint pain/arthritis   Alternate ice and heat to hip and back  as needed.   If not better in 2  Weeks or worse will need to Dr.  Acquanetta Sit     05/30/2012 f/u ov/Catherine Hoffman cc pain into L hip to big toe x daily since 6 months and not following action plan on med calendar and hasn't been back to Dr Darrelyn Hillock because her back feels a little better. No numbness or weakness.   Sleeping ok without nocturnal  or early am exacerbation  of respiratory  c/o's or need for noct saba. Also denies any obvious fluctuation of symptoms with weather or environmental changes or other aggravating or alleviating factors except as outlined above   ROS  The following are not active complaints unless bolded sore throat, dysphagia, dental problems, itching, sneezing,  nasal congestion or excess/ purulent secretions, ear ache,   fever, chills, sweats, unintended wt loss, pleuritic or exertional cp, hemoptysis,  orthopnea pnd or leg swelling, presyncope, palpitations, heartburn, abdominal pain, anorexia, nausea, vomiting, diarrhea  or change in bowel or urinary habits, change in stools or urine, dysuria,hematuria,  rash, arthralgias, visual complaints, headache, numbness weakness or ataxia or problems with walking or coordination,  change in mood/affect or memory.       Past Medical History:  GERD(ICD-530.81)..............................................................................Marland KitchenRussella Dar  - EGD 04/28/2001 esophagitis with HH  Chronic constipation  Internal Hemorrhoids  - Colonoscopy 5/ 2006  - Colonoscopy ok except hem 11/20/08  MORBID OBESITY (ICD-278.01)  -  Target wt = 158 for BMI < 30  EPISODIC TENSION TYPE HEADACHE (ICD-339.11)  Hypertension  Degenerative arthritis  - Arthroscopy Left knee, Dr Barry Dienes  Radicular back/ L leg pain 08/13/2011 > [facility] rec mri ? done Herpes zoster-1998 with no significant residual R T 12  Hyperlipidemia  - Targe LDL < 160 (HBP only known RF)  Health Maintenance................................................................................Marland KitchenWert   - Pneumovax February 25, 2009  - Td February 25, 2009  - CPX 09/25/2010  - GYN [facility] -med calendar 12/21/2011   Family History:  positive for hypertension and diabetes in her mother  cancer of the leg in her mother does not know the type  She has no full siblings does not know her father's history  no gallbladder disease to her knowledge  No FH of Colon Cancer:   Social History:  she denies ever smoking  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                Objective:   Physical Exam     wt 207 May 21, 2008 > > 193 February 26, 2010 >  197 09/25/10 > 12/30/2010  193 > 06/29/2011  192 > 08/13/2011  190 > 09/28/2011  177>191 11/17/2011 >185 12/21/2011 > 188 05/30/2012  GENERAL: A/Ox3; pleasant & cooperative.NAD  HEENT: [place]/AT,  , EACs-clear, TMs-wnl, NOSE-mod nonspecific Turbinate edema, THROAT-clear & wnl.  NECK: Supple w/ fair ROM; no JVD; normal carotid impulses w/o bruits; no thyromegaly or nodules palpated; no lymphadenopathy.  CHEST: Clear to P & A; w/o, wheezes/ rales/ or rhonchi.  HEART: RRR, no m/r/g heard. pedal pulses sym reduced bilaterally  ABDOMEN: Soft & nt; nml bowel sounds; no organomegaly or masses detected.  EXT: Warm bilat, no calf pain, edema, clubbing, pulses intact MS nl gait, slt decreased ROM of left leg/hip , extension and abduction no pain eliciteed  nml strength, neg SLR  No rash.  Neuro: sensorium intact, no motor or cerebellar def, no pathologic reflexes  cxr ordered 05/30/12 Not done   Assessment & Plan:

## 2012-05-30 NOTE — Patient Instructions (Addendum)
Please remember to go to the lab and x-ray department downstairs for your tests - we will call you with the results when they are available.    Call [facility] re Leg pain which is likely coming from your leg.  See calendar for specific medication instructions and bring it back for each and every office visit for every healthcare provider you see.  Without it,  you may not receive the best quality medical care that we feel you deserve.  You will note that the calendar groups together  your maintenance  medications that are timed at particular times of the day.  Think of this as your checklist for what your doctor has instructed you to do until your next evaluation to see what benefit  there is  to staying on a consistent group of medications intended to keep you well.  The other group at the bottom is entirely up to you to use as you see fit  for specific symptoms that may arise between visits that require you to treat them on an as needed basis.  Think of this as your action plan or "what if" list.   Separating the top medications from the bottom group is fundamental to providing you adequate care going forward.    Please schedule a follow up visit in 3 months but call sooner if needed to see Tammy on next visit

## 2012-05-31 LAB — VITAMIN D 25 HYDROXY (VIT D DEFICIENCY, FRACTURES): Vit D, 25-Hydroxy: 31 ng/mL (ref 30–89)

## 2012-05-31 NOTE — Assessment & Plan Note (Signed)
Adequate control on present rx, reviewed  

## 2012-05-31 NOTE — Assessment & Plan Note (Signed)
-   Target LDL < 160 due to single risk factor HBP  Lab Results  Component Value Date   CHOL 212* 05/30/2012   HDL 54.70 05/30/2012   LDLDIRECT 150.2 05/30/2012   TRIG 46.0 05/30/2012   CHOLHDL 4 05/30/2012   Adequate control on present rx, reviewed

## 2012-05-31 NOTE — Assessment & Plan Note (Signed)
-   Target wt = 158 for BMI < 30   Reviewed cal balance issues

## 2012-05-31 NOTE — Assessment & Plan Note (Signed)
Pt reports previous response to short course of prednisone but definitely needs to f/u with ortho/ reviewed  rx  Prednisone 10 mg take  4 each am x 2 days,   2 each am x 2 days,  1 each am x2days and stop See instructions for specific recommendations which were reviewed directly with the patient who was given a copy with highlighter outlining the key components.

## 2012-06-07 ENCOUNTER — Telehealth: Payer: Self-pay | Admitting: Pulmonary Disease

## 2012-06-07 NOTE — Telephone Encounter (Signed)
Member $ [id number]. Omeprazole 40mg  BID has been APPROVED from 06/07/12 through 06/07/13. The pt and pharmacy have been notified.  Case #[id number].

## 2012-06-07 NOTE — Progress Notes (Signed)
Quick Note:  Called, spoke with pt. Informed her of lab results and recs per Dr. [name]. She verbalized understanding. ______

## 2012-06-07 NOTE — Progress Notes (Signed)
Quick Note:  Called, spoke with pt. Informed her of lab results and recs per Dr. [name] . He verbalized understanding. ______

## 2012-07-01 DIAGNOSIS — M19019 Primary osteoarthritis, unspecified shoulder: Secondary | ICD-10-CM | POA: Diagnosis not present

## 2012-07-07 ENCOUNTER — Telehealth: Payer: Self-pay | Admitting: Internal Medicine

## 2012-07-07 NOTE — Telephone Encounter (Signed)
ATC pt NA WCB 

## 2012-07-07 NOTE — Telephone Encounter (Signed)
Pt called back stating she is having pain under her breast and wants to know what she can take for this.  She can be reached @ [phone]. Catherine Hoffman

## 2012-07-08 NOTE — Telephone Encounter (Signed)
ATC NA WCB 

## 2012-07-12 NOTE — Telephone Encounter (Signed)
Pt states she believes this was "gas" related to the foods she was eating and has since stopped eating those foods. Her pain has subsided and nothing further is needed at this time. She will call back if the pain returns.

## 2012-08-05 ENCOUNTER — Other Ambulatory Visit: Payer: Self-pay | Admitting: Adult Health

## 2012-08-05 DIAGNOSIS — M7500 Adhesive capsulitis of unspecified shoulder: Secondary | ICD-10-CM | POA: Diagnosis not present

## 2012-08-09 DIAGNOSIS — M7500 Adhesive capsulitis of unspecified shoulder: Secondary | ICD-10-CM | POA: Diagnosis not present

## 2012-08-19 ENCOUNTER — Encounter: Payer: Self-pay | Admitting: Adult Health

## 2012-08-19 ENCOUNTER — Ambulatory Visit (INDEPENDENT_AMBULATORY_CARE_PROVIDER_SITE_OTHER): Payer: BC Managed Care – PPO | Admitting: Adult Health

## 2012-08-19 VITALS — BP 132/82 | HR 90 | Temp 97.5°F | Ht 64.0 in | Wt 190.2 lb

## 2012-08-19 DIAGNOSIS — E7800 Pure hypercholesterolemia, unspecified: Secondary | ICD-10-CM | POA: Diagnosis not present

## 2012-08-19 DIAGNOSIS — M1990 Unspecified osteoarthritis, unspecified site: Secondary | ICD-10-CM

## 2012-08-19 DIAGNOSIS — K219 Gastro-esophageal reflux disease without esophagitis: Secondary | ICD-10-CM

## 2012-08-19 NOTE — Assessment & Plan Note (Signed)
Advised on wt loss, low fat/chol diet

## 2012-08-19 NOTE — Assessment & Plan Note (Signed)
Cont w/ actiivity and walking

## 2012-08-19 NOTE — Progress Notes (Signed)
Subjective:    Patient ID: Catherine Hoffman, female    DOB: 07/01/1940 .   MRN: [id number]  HPI 38 yobf never smoker with hypertension, degenerative arthritis, and difficulty following through on instructions regarding medications and weight loss.     06/29/2011 f/u ov/Wert cc no  Cp, tia, claudication or limiting sob rec Cal balance reviewed for wt loss  08/13/2011 f/u ov/Wert cc 2 year wax and wane intermittent pain in L back/ hip into lower leg this flare started x 2 weeks and neg urology  w/u  Rec:  she's taking meloxicam every 4 hours not following calendar at all. No numbness or weakness, no change in bowel or bladder habits. No pain with cough. Referred to [facility] rx prednisone > better   09/28/2011 f/u ov/Wert in for multiple medical problem f/u but not fasting as rec for cpx. Back and leg better, no need for even meloxicam, using med calendar more consistently. >referral to ortho   11/17/2011 Acute OV  Complains of left arm /lef shoulder pain radiating into back of neck  For 2 weeks.  Denies known injury .  No chest pain or syncope.  No arm weakness. No rash.  Seen by ortho for low back issues last month , resolved with tx.  Took tylenol for shoulder pain , no better.  >>Mobic rx   12/21/2011 Follow up and med review  Returns for follow up and med review  We reviewed all her meds and organized them into a med calendar with pt education  It appears she is taking her meds correctly We reviewed diet, exercise and weight loss  Does complain of left hip and groin/thigh pain  Very stiff at times esp in am and after prolonged sitting.  No known injury . No otc used . No leg weakness or rash  No chest pain, dyspnea or edema.  rec Follow med calendar closely and bring to each visit.  Take  Mobic 7.5mg  twice daily  w/ food for 1 week then As needed for joint pain/arthritis   Alternate ice and heat to hip and back  as needed.   If not better in 2  Weeks or worse will need to Dr.  Acquanetta Sit     05/30/2012 f/u ov/Wert cc pain into L hip to big toe x daily since 6 months and not following action plan on med calendar and hasn't been back to Dr Darrelyn Hillock because her back feels a little better. No numbness or weakness.  >labs   08/19/2012 Follow up and med review  Returns for follow up and med review  We reviewed all her meds and organized them into a med calendar with pt education .  Appears she is taking her meds correctly.  Doing well , Still works fulltime.  We discussed low fat cholesterol and wt loss.  LDL was elevated ~150.  No CP, dypsnea, bloody stools, dizziness, syncope.  Follows yearly with GYN for PAP and mammo Colon UTD .  Immunization UTD.          Past Medical History:  GERD(ICD-530.81)..............................................................................Marland KitchenRussella Dar  - EGD 04/28/2001 esophagitis with HH  Chronic constipation  Internal Hemorrhoids  - Colonoscopy 5/ 2006  - Colonoscopy ok except hem 11/20/08  MORBID OBESITY (ICD-278.01)  - Target wt = 158 for BMI < 30  EPISODIC TENSION TYPE HEADACHE (ICD-339.11)  Hypertension  Degenerative arthritis  - Arthroscopy Left knee, Dr Barry Dienes  Radicular back/ L leg pain 08/13/2011 > [facility] rec mri ?  done Herpes zoster-1998 with no significant residual R T 12  Hyperlipidemia  - Targe LDL < 160 (HBP only known RF)  Health Maintenance................................................................................Marland KitchenWert   - Pneumovax February 25, 2009  - Td February 25, 2009  - CPX 09/25/2010  - GYN [facility] -mammo/pap  -med calendar 12/21/2011 , 08/19/2012   Family History:  positive for hypertension and diabetes in her mother  cancer of the leg in her mother does not know the type  She has no full siblings does not know her father's history  no gallbladder disease to her knowledge  No FH of Colon Cancer:   Social History:  she denies ever smoking  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                Objective:   Physical Exam     wt 207 May 21, 2008 > > 193 February 26, 2010 >  197 09/25/10 > 12/30/2010  193 > 06/29/2011  192 > 08/13/2011  190 > 09/28/2011  177>191 11/17/2011 >185 12/21/2011 > 188 05/30/2012 >190 08/19/2012  GENERAL: A/Ox3; pleasant & cooperative.NAD  HEENT: [place]/AT,  , EACs-clear, TMs-wnl, NOSE-mod nonspecific Turbinate edema, THROAT-clear & wnl.  NECK: Supple w/ fair ROM; no JVD; normal carotid impulses w/o bruits; no thyromegaly or nodules palpated; no lymphadenopathy.  CHEST: Clear to P & A; w/o, wheezes/ rales/ or rhonchi.  HEART: RRR, no m/r/g heard. pedal pulses sym reduced bilaterally  ABDOMEN: Soft & nt; nml bowel sounds; no organomegaly or masses detected.  EXT: Warm bilat, no calf pain, edema, clubbing, pulses intact MS nl gait,  No rash.  Neuro: sensorium intact, no focal deficits noted.     Assessment & Plan:

## 2012-08-19 NOTE — Patient Instructions (Addendum)
Follow med calendar closely and bring to each visit.  Low fat cholesterol diet  Work on exercise and weight loss Follow up 6  month with Dr. [name]

## 2012-08-19 NOTE — Assessment & Plan Note (Signed)
Wt loss encouraged  Diet discussed along with exercise  Patient's medications were reviewed today and patient education was given. Computerized medication calendar was adjusted/completed

## 2012-08-19 NOTE — Assessment & Plan Note (Signed)
Controlled on RX /PPI /GERD diet  No changes

## 2012-08-20 DIAGNOSIS — M7500 Adhesive capsulitis of unspecified shoulder: Secondary | ICD-10-CM | POA: Diagnosis not present

## 2012-08-25 ENCOUNTER — Encounter (HOSPITAL_BASED_OUTPATIENT_CLINIC_OR_DEPARTMENT_OTHER): Payer: Self-pay | Admitting: *Deleted

## 2012-08-25 DIAGNOSIS — H40039 Anatomical narrow angle, unspecified eye: Secondary | ICD-10-CM | POA: Diagnosis not present

## 2012-08-25 DIAGNOSIS — H401290 Low-tension glaucoma, unspecified eye, stage unspecified: Secondary | ICD-10-CM | POA: Diagnosis not present

## 2012-08-25 DIAGNOSIS — H1045 Other chronic allergic conjunctivitis: Secondary | ICD-10-CM | POA: Diagnosis not present

## 2012-08-25 DIAGNOSIS — H2510 Age-related nuclear cataract, unspecified eye: Secondary | ICD-10-CM | POA: Diagnosis not present

## 2012-08-26 ENCOUNTER — Encounter (HOSPITAL_BASED_OUTPATIENT_CLINIC_OR_DEPARTMENT_OTHER): Payer: Self-pay | Admitting: *Deleted

## 2012-08-26 NOTE — Progress Notes (Signed)
To [facility] at [address] on arrival-Ekg with chart-Npo after Mn-will take omeprazole,K-dur,zyrtec,use flonase that am.Plan overnight stay-explained would be transfered to [facility] post recovery.

## 2012-08-29 NOTE — H&P (Signed)
Catherine Hoffman is an 72 y.o. female.   Chief Complaint: left shoulder pain HPI: The patient is a 72 year old female who presented to Dr. Jeannetta Ellis office 2 months ago with left shoulder pain. She was treated for bursitis, receiving a cortisone injection, which helped only a few weeks. She continued to have progressively worsening pain in the left shoulder as well as loss of motion. MRI revealed full thickness left shoulder rotator cuff tear with retraction. .   Past Medical History  Diagnosis Date  . Esophageal reflux   . Hypertension   . Episodic tension type headache   . Hyperlipidemia   . Arthritis   . Left rotator cuff tear   . Kidney stone on left side Hx-date unknown    Past Surgical History  Procedure Laterality Date  . Total abdominal hysterectomy w/ bilateral salpingoophorectomy  1990's  . Knee arthroscopy Left 2004    Family History  Problem Relation Age of Onset  . Hypertension Mother   . Diabetes Mother    Social History:  reports that she has never smoked. She has never used smokeless tobacco. She reports that she does not drink alcohol or use illicit drugs.  Allergies:  Allergies  Allergen Reactions  . Hydrocodone-Acetaminophen     REACTION: GI upset    Current outpatient prescriptions: acetaminophen (TYLENOL) 500 MG tablet, Per bottle, Disp: , Rfl: ;   aspirin 81 MG tablet, Take 81 mg by mouth daily.  , Disp: , Rfl: ;   calcium-vitamin D (OSCAL WITH D) 500-200 MG-UNIT per tablet, Take 1 tablet by mouth 2 (two) times daily.  , Disp: , Rfl: ;   cetirizine (ZYRTEC) 10 MG tablet, Take 10 mg by mouth at bedtime as needed. , Disp: , Rfl:  docusate sodium (COLACE) 100 MG capsule, 1-2 at bedtime as needed, Disp: , Rfl: ;   estradiol (ESTRACE) 0.5 MG tablet, Take 0.5 mg by mouth daily., Disp: , Rfl: ;   fluticasone (FLONASE) 50 MCG/ACT nasal spray, 0-2 puffs each nostril in the morning and 1-2 puffs at bedtime, Disp: , Rfl: ;   meclizine (MEDI-MECLIZINE) 25 MG  tablet, Take 25 mg by mouth every 4 (four) hours as needed. , Disp: , Rfl:  meloxicam (MOBIC) 7.5 MG tablet, Take 1 tablet (7.5 mg total) by mouth every 12 (twelve) hours as needed (for arthritis / knee pain)., Disp: 180 tablet, Rfl: 1;   omeprazole (PRILOSEC) 40 MG capsule, Take  One  30 minutes before first and last meal of the day, Disp: 180 capsule, Rfl: 1 oxyCODONE-acetaminophen (PERCOCET/ROXICET) 5-325 MG per tablet, Take 1 tablet by mouth every 4 (four) hours as needed. (this has tylenol in it), Disp: 30 tablet, Rfl: 0;   potassium chloride SA (K-DUR,KLOR-CON) 20 MEQ tablet, TAKE ONE TABLET BY MOUTH ONE TIME DAILY, Disp: 30 tablet, Rfl: 5;   predniSONE (STERAPRED UNI-PAK) 10 MG tablet, Prednisone 10 mg take  4 each am x 2 days,   2 each am x 2 days,  1 each am x2days and stop, Disp: 14 tablet, Rfl: 0 promethazine (PHENERGAN) 25 MG tablet, 1 tablet by mouth every 4-6 hours as needed for nausea/vomiting, Disp: , Rfl: ;   spironolactone (ALDACTONE) 25 MG tablet, Take 1 tablet (25 mg total) by mouth 2 (two) times daily., Disp: 180 tablet, Rfl: 1;   traMADol (ULTRAM) 50 MG tablet, Take 1 tablet (50 mg total) by mouth every 4 (four) hours as needed., Disp: 30 tablet, Rfl: 1 traZODone (DESYREL) 50 MG  tablet, Take 1-2 at bedtime as needed for trouble sleeping, Disp: , Rfl:    Review of Systems  Constitutional: Negative.   HENT: Negative.  Negative for neck pain.   Eyes: Negative.   Respiratory: Negative.   Cardiovascular: Negative.   Gastrointestinal: Negative.   Genitourinary: Negative.   Musculoskeletal: Positive for back pain and joint pain. Negative for myalgias and falls.       Left shoulder pain  Skin: Negative.   Neurological: Negative.   Endo/Heme/Allergies: Negative.   Psychiatric/Behavioral: Negative.     Height 5\' 4"  (1.626 m), weight 86.183 kg (190 lb). Physical Exam  Constitutional: She is oriented to person, place, and time. She appears well-developed and well-nourished. No  distress.  HENT:  Head: Normocephalic and atraumatic.  Right Ear: External ear normal.  Left Ear: External ear normal.  Nose: Nose normal.  Mouth/Throat: Oropharynx is clear and moist.  Eyes: Conjunctivae are normal.  Neck: Normal range of motion. Neck supple. No tracheal deviation present. No thyromegaly present.  Cardiovascular: Normal rate, regular rhythm, normal heart sounds and intact distal pulses.   No murmur heard. Respiratory: Effort normal and breath sounds normal. No respiratory distress. She has no wheezes. She exhibits no tenderness.  GI: Soft. Bowel sounds are normal. She exhibits no distension and no mass. There is no tenderness.  Musculoskeletal:       Right shoulder: Normal.       Left shoulder: She exhibits decreased range of motion, tenderness, pain and decreased strength. She exhibits no effusion.       Right elbow: Normal.      Left elbow: Normal.       Cervical back: Normal.  Lymphadenopathy:    She has no cervical adenopathy.  Neurological: She is alert and oriented to person, place, and time. She has normal reflexes. No sensory deficit.  Skin: No rash noted. She is not diaphoretic. No erythema.  Psychiatric: She has a normal mood and affect. Her behavior is normal.     Assessment/Plan Left shoulder rotator cuff tear She has a significant tear of the rotator cuff of the left shoulder. She has a 2.4 cm. full thickness tear with retraction of about 1 cm. She needs an open left shoulder rotator cuff repair with possible use of graft and anchors. Risks and benefits of the surgery were discussed with the patient by Dr. Elmer Bales, Anelisse Jacobson Leotis Shames 08/29/2012, 7:56 AM

## 2012-08-29 NOTE — Anesthesia Preprocedure Evaluation (Addendum)
Anesthesia Evaluation  Patient identified by MRN, date of birth, ID band Patient awake    Reviewed: Allergy & Precautions, H&P , NPO status , Patient's Chart, lab work & pertinent test results  Airway Mallampati: II TM Distance: >3 FB Neck ROM: full    Dental  (+) Edentulous Upper and Dental Advisory Given   Pulmonary neg pulmonary ROS,  breath sounds clear to auscultation  Pulmonary exam normal       Cardiovascular Exercise Tolerance: Good hypertension, Pt. on medications Rhythm:regular Rate:Normal     Neuro/Psych negative neurological ROS  negative psych ROS   GI/Hepatic negative GI ROS, Neg liver ROS, GERD-  Medicated and Controlled,  Endo/Other  negative endocrine ROS  Renal/GU negative Renal ROS  negative genitourinary   Musculoskeletal   Abdominal (+) + obese,   Peds  Hematology negative hematology ROS (+)   Anesthesia Other Findings   Reproductive/Obstetrics negative OB ROS                         Anesthesia Physical Anesthesia Plan  ASA: III  Anesthesia Plan: General   Post-op Pain Management:    Induction: Intravenous  Airway Management Planned: Oral ETT  Additional Equipment:   Intra-op Plan:   Post-operative Plan: Extubation in OR  Informed Consent: I have reviewed the patients History and Physical, chart, labs and discussed the procedure including the risks, benefits and alternatives for the proposed anesthesia with the patient or authorized representative who has indicated his/her understanding and acceptance.   Dental Advisory Given  Plan Discussed with: CRNA and Surgeon  Anesthesia Plan Comments:        Anesthesia Quick Evaluation

## 2012-08-30 ENCOUNTER — Encounter (HOSPITAL_BASED_OUTPATIENT_CLINIC_OR_DEPARTMENT_OTHER): Payer: Self-pay | Admitting: Anesthesiology

## 2012-08-30 ENCOUNTER — Observation Stay (HOSPITAL_BASED_OUTPATIENT_CLINIC_OR_DEPARTMENT_OTHER)
Admission: RE | Admit: 2012-08-30 | Discharge: 2012-08-31 | Disposition: A | Discharge status: Home / Self Care | Payer: BC Managed Care – PPO | Source: Ambulatory Visit | Attending: Orthopedic Surgery | Admitting: Orthopedic Surgery

## 2012-08-30 ENCOUNTER — Observation Stay (HOSPITAL_COMMUNITY)
Admission: AD | Admit: 2012-08-30 | Payer: BC Managed Care – PPO | Source: Ambulatory Visit | Admitting: Orthopedic Surgery

## 2012-08-30 ENCOUNTER — Encounter (HOSPITAL_COMMUNITY)
Admission: RE | Disposition: A | Discharge status: Home / Self Care | Payer: Self-pay | Source: Ambulatory Visit | Attending: Orthopedic Surgery

## 2012-08-30 ENCOUNTER — Encounter (HOSPITAL_BASED_OUTPATIENT_CLINIC_OR_DEPARTMENT_OTHER): Payer: Self-pay | Admitting: *Deleted

## 2012-08-30 ENCOUNTER — Ambulatory Visit (HOSPITAL_BASED_OUTPATIENT_CLINIC_OR_DEPARTMENT_OTHER): Payer: BC Managed Care – PPO | Admitting: Anesthesiology

## 2012-08-30 DIAGNOSIS — K219 Gastro-esophageal reflux disease without esophagitis: Secondary | ICD-10-CM | POA: Diagnosis not present

## 2012-08-30 DIAGNOSIS — Z7982 Long term (current) use of aspirin: Secondary | ICD-10-CM | POA: Insufficient documentation

## 2012-08-30 DIAGNOSIS — I10 Essential (primary) hypertension: Secondary | ICD-10-CM | POA: Diagnosis not present

## 2012-08-30 DIAGNOSIS — Z79899 Other long term (current) drug therapy: Secondary | ICD-10-CM | POA: Diagnosis not present

## 2012-08-30 DIAGNOSIS — S43429A Sprain of unspecified rotator cuff capsule, initial encounter: Secondary | ICD-10-CM | POA: Diagnosis not present

## 2012-08-30 DIAGNOSIS — M67919 Unspecified disorder of synovium and tendon, unspecified shoulder: Principal | ICD-10-CM | POA: Insufficient documentation

## 2012-08-30 DIAGNOSIS — M75120 Complete rotator cuff tear or rupture of unspecified shoulder, not specified as traumatic: Secondary | ICD-10-CM | POA: Diagnosis present

## 2012-08-30 DIAGNOSIS — M719 Bursopathy, unspecified: Secondary | ICD-10-CM | POA: Diagnosis not present

## 2012-08-30 DIAGNOSIS — M25819 Other specified joint disorders, unspecified shoulder: Secondary | ICD-10-CM | POA: Insufficient documentation

## 2012-08-30 DIAGNOSIS — E785 Hyperlipidemia, unspecified: Secondary | ICD-10-CM | POA: Diagnosis not present

## 2012-08-30 DIAGNOSIS — S43499A Other sprain of unspecified shoulder joint, initial encounter: Secondary | ICD-10-CM | POA: Diagnosis not present

## 2012-08-30 DIAGNOSIS — M75122 Complete rotator cuff tear or rupture of left shoulder, not specified as traumatic: Secondary | ICD-10-CM

## 2012-08-30 DIAGNOSIS — E669 Obesity, unspecified: Secondary | ICD-10-CM | POA: Diagnosis not present

## 2012-08-30 HISTORY — DX: Calculus of kidney: N20.0

## 2012-08-30 HISTORY — PX: SHOULDER OPEN ROTATOR CUFF REPAIR: SHX2407

## 2012-08-30 HISTORY — DX: Unspecified rotator cuff tear or rupture of left shoulder, not specified as traumatic: M75.102

## 2012-08-30 LAB — COMPREHENSIVE METABOLIC PANEL
ALT: 10 U/L (ref 0–35)
AST: 15 U/L (ref 0–37)
Albumin: 3.7 g/dL (ref 3.5–5.2)
Alkaline Phosphatase: 84 U/L (ref 39–117)
BUN: 12 mg/dL (ref 6–23)
CO2: 25 mEq/L (ref 19–32)
Calcium: 9.2 mg/dL (ref 8.4–10.5)
Chloride: 106 mEq/L (ref 96–112)
Creatinine, Ser: 0.96 mg/dL (ref 0.50–1.10)
GFR calc Af Amer: 67 mL/min — ABNORMAL LOW (ref >90)
GFR calc non Af Amer: 58 mL/min — ABNORMAL LOW (ref >90)
Glucose, Bld: 98 mg/dL (ref 70–99)
Potassium: 3.9 mEq/L (ref 3.5–5.1)
Sodium: 141 mEq/L (ref 135–145)
Total Bilirubin: 0.4 mg/dL (ref 0.3–1.2)
Total Protein: 7.2 g/dL (ref 6.0–8.3)

## 2012-08-30 LAB — PROTIME-INR
INR: 0.89 (ref 0.00–1.49)
Prothrombin Time: 12 seconds (ref 11.6–15.2)

## 2012-08-30 LAB — URINALYSIS, ROUTINE W REFLEX MICROSCOPIC
Bilirubin Urine: NEGATIVE
Glucose, UA: NEGATIVE mg/dL
Hgb urine dipstick: NEGATIVE
Ketones, ur: NEGATIVE mg/dL
Leukocytes, UA: NEGATIVE
Nitrite: NEGATIVE
Protein, ur: NEGATIVE mg/dL
Specific Gravity, Urine: 1.025 (ref 1.005–1.030)
Urobilinogen, UA: 0.2 mg/dL (ref 0.0–1.0)
pH: 5 (ref 5.0–8.0)

## 2012-08-30 LAB — APTT: aPTT: 32 seconds (ref 24–37)

## 2012-08-30 SURGERY — REPAIR, ROTATOR CUFF, OPEN
Anesthesia: General | Site: Shoulder | Laterality: Left | Wound class: Clean

## 2012-08-30 MED ORDER — EPHEDRINE SULFATE 50 MG/ML IJ SOLN
INTRAMUSCULAR | Status: DC | PRN
  Administered 2012-08-30: 15 mg via INTRAVENOUS

## 2012-08-30 MED ORDER — POLYETHYLENE GLYCOL 3350 17 G PO PACK
17.0000 g | PACK | Freq: Every day | ORAL | Status: DC | PRN
  Filled 2012-08-30: qty 1

## 2012-08-30 MED ORDER — SODIUM CHLORIDE 0.9 % IR SOLN
Status: DC | PRN
  Administered 2012-08-30: 08:00:00

## 2012-08-30 MED ORDER — ACETAMINOPHEN 10 MG/ML IV SOLN
INTRAVENOUS | Status: DC | PRN
  Administered 2012-08-30: 1000 mg via INTRAVENOUS

## 2012-08-30 MED ORDER — OXYCODONE-ACETAMINOPHEN 5-325 MG PO TABS
1.0000 | ORAL_TABLET | ORAL | Status: DC | PRN
  Administered 2012-08-30 – 2012-08-31 (×6): 1 via ORAL
  Filled 2012-08-30 (×7): qty 1

## 2012-08-30 MED ORDER — LIDOCAINE HCL 4 % MT SOLN
OROMUCOSAL | Status: DC | PRN
  Administered 2012-08-30: 4 mL via TOPICAL

## 2012-08-30 MED ORDER — HYDROMORPHONE HCL PF 1 MG/ML IJ SOLN
0.5000 mg | INTRAMUSCULAR | Status: DC | PRN
  Administered 2012-08-30: 0.25 mg via INTRAVENOUS
  Administered 2012-08-30: 0.5 mg via INTRAVENOUS
  Administered 2012-08-30: 0.25 mg via INTRAVENOUS
  Filled 2012-08-30 (×2): qty 1

## 2012-08-30 MED ORDER — FLEET ENEMA 7-19 GM/118ML RE ENEM
1.0000 | ENEMA | Freq: Once | RECTAL | Status: AC | PRN
  Filled 2012-08-30: qty 1

## 2012-08-30 MED ORDER — CEFAZOLIN SODIUM-DEXTROSE 2-3 GM-% IV SOLR
2.0000 g | INTRAVENOUS | Status: AC
  Administered 2012-08-30: 2 g via INTRAVENOUS
  Filled 2012-08-30: qty 50

## 2012-08-30 MED ORDER — TRAZODONE HCL 50 MG PO TABS
50.0000 mg | ORAL_TABLET | Freq: Every day | ORAL | Status: DC
  Administered 2012-08-30: 50 mg via ORAL
  Filled 2012-08-30 (×3): qty 1

## 2012-08-30 MED ORDER — CEFAZOLIN SODIUM 1-5 GM-% IV SOLN
1.0000 g | Freq: Four times a day (QID) | INTRAVENOUS | Status: AC
  Administered 2012-08-30 – 2012-08-31 (×3): 1 g via INTRAVENOUS
  Filled 2012-08-30 (×3): qty 50

## 2012-08-30 MED ORDER — LIDOCAINE HCL (CARDIAC) 20 MG/ML IV SOLN
INTRAVENOUS | Status: DC | PRN
  Administered 2012-08-30: 50 mg via INTRAVENOUS

## 2012-08-30 MED ORDER — PANTOPRAZOLE SODIUM 40 MG PO TBEC
40.0000 mg | DELAYED_RELEASE_TABLET | Freq: Every day | ORAL | Status: DC
  Administered 2012-08-31: 40 mg via ORAL
  Filled 2012-08-30 (×2): qty 1

## 2012-08-30 MED ORDER — FENTANYL CITRATE 0.05 MG/ML IJ SOLN
INTRAMUSCULAR | Status: DC | PRN
  Administered 2012-08-30 (×2): 50 ug via INTRAVENOUS
  Administered 2012-08-30 (×2): 25 ug via INTRAVENOUS
  Administered 2012-08-30: 50 ug via INTRAVENOUS

## 2012-08-30 MED ORDER — METHOCARBAMOL 500 MG PO TABS
500.0000 mg | ORAL_TABLET | Freq: Four times a day (QID) | ORAL | Status: DC | PRN
  Administered 2012-08-30 – 2012-08-31 (×3): 500 mg via ORAL
  Filled 2012-08-30 (×4): qty 1

## 2012-08-30 MED ORDER — ONDANSETRON HCL 4 MG PO TABS
4.0000 mg | ORAL_TABLET | Freq: Four times a day (QID) | ORAL | Status: DC | PRN
  Filled 2012-08-30: qty 1

## 2012-08-30 MED ORDER — LACTATED RINGERS IV SOLN
INTRAVENOUS | Status: DC
  Filled 2012-08-30: qty 1000

## 2012-08-30 MED ORDER — BISACODYL 10 MG RE SUPP
10.0000 mg | Freq: Every day | RECTAL | Status: DC | PRN
  Filled 2012-08-30: qty 1

## 2012-08-30 MED ORDER — SUCCINYLCHOLINE CHLORIDE 20 MG/ML IJ SOLN
INTRAMUSCULAR | Status: DC | PRN
  Administered 2012-08-30: 140 mg via INTRAVENOUS

## 2012-08-30 MED ORDER — ONDANSETRON HCL 4 MG/2ML IJ SOLN
4.0000 mg | Freq: Four times a day (QID) | INTRAMUSCULAR | Status: DC | PRN
  Administered 2012-08-30: 4 mg via INTRAVENOUS
  Filled 2012-08-30 (×2): qty 2

## 2012-08-30 MED ORDER — GLYCOPYRROLATE 0.2 MG/ML IJ SOLN
INTRAMUSCULAR | Status: DC | PRN
  Administered 2012-08-30: 0.2 mg via INTRAVENOUS

## 2012-08-30 MED ORDER — ONDANSETRON HCL 4 MG/2ML IJ SOLN
INTRAMUSCULAR | Status: DC | PRN
  Administered 2012-08-30: 4 mg via INTRAVENOUS

## 2012-08-30 MED ORDER — FLUTICASONE PROPIONATE 50 MCG/ACT NA SUSP
1.0000 | Freq: Every day | NASAL | Status: DC
  Administered 2012-08-31: 1 via NASAL
  Filled 2012-08-30 (×2): qty 16

## 2012-08-30 MED ORDER — PHENOL 1.4 % MT LIQD
1.0000 | OROMUCOSAL | Status: DC | PRN
  Filled 2012-08-30: qty 177

## 2012-08-30 MED ORDER — BUPIVACAINE LIPOSOME 1.3 % IJ SUSP
INTRAMUSCULAR | Status: DC | PRN
  Administered 2012-08-30: 20 mL

## 2012-08-30 MED ORDER — ASPIRIN 81 MG PO CHEW
81.0000 mg | CHEWABLE_TABLET | Freq: Every day | ORAL | Status: DC
  Administered 2012-08-31: 81 mg via ORAL
  Filled 2012-08-30 (×2): qty 1

## 2012-08-30 MED ORDER — LACTATED RINGERS IV SOLN
INTRAVENOUS | Status: DC
  Administered 2012-08-30 (×2): via INTRAVENOUS
  Filled 2012-08-30: qty 1000

## 2012-08-30 MED ORDER — ACETAMINOPHEN 325 MG PO TABS
650.0000 mg | ORAL_TABLET | Freq: Four times a day (QID) | ORAL | Status: DC | PRN
  Filled 2012-08-30: qty 2

## 2012-08-30 MED ORDER — DEXAMETHASONE SODIUM PHOSPHATE 4 MG/ML IJ SOLN
INTRAMUSCULAR | Status: DC | PRN
  Administered 2012-08-30: 10 mg via INTRAVENOUS

## 2012-08-30 MED ORDER — ESTRADIOL 1 MG PO TABS
0.5000 mg | ORAL_TABLET | Freq: Every day | ORAL | Status: DC
  Administered 2012-08-31: 0.5 mg via ORAL
  Filled 2012-08-30 (×3): qty 0.5

## 2012-08-30 MED ORDER — PROPOFOL 10 MG/ML IV BOLUS
INTRAVENOUS | Status: DC | PRN
  Administered 2012-08-30: 30 mg via INTRAVENOUS
  Administered 2012-08-30: 170 mg via INTRAVENOUS

## 2012-08-30 MED ORDER — LACTATED RINGERS IV SOLN
INTRAVENOUS | Status: DC
  Administered 2012-08-30 – 2012-08-31 (×2): via INTRAVENOUS
  Filled 2012-08-30: qty 1000

## 2012-08-30 MED ORDER — MENTHOL 3 MG MT LOZG
1.0000 | LOZENGE | OROMUCOSAL | Status: DC | PRN
  Filled 2012-08-30: qty 9

## 2012-08-30 MED ORDER — FENTANYL CITRATE 0.05 MG/ML IJ SOLN
25.0000 ug | INTRAMUSCULAR | Status: DC | PRN
  Administered 2012-08-30 (×2): 50 ug via INTRAVENOUS
  Filled 2012-08-30: qty 1

## 2012-08-30 MED ORDER — CEFAZOLIN SODIUM 1-5 GM-% IV SOLN
1.0000 g | Freq: Four times a day (QID) | INTRAVENOUS | Status: DC
  Filled 2012-08-30 (×2): qty 50

## 2012-08-30 MED ORDER — SPIRONOLACTONE 25 MG PO TABS
25.0000 mg | ORAL_TABLET | Freq: Two times a day (BID) | ORAL | Status: DC
  Administered 2012-08-30 – 2012-08-31 (×2): 25 mg via ORAL
  Filled 2012-08-30 (×5): qty 1

## 2012-08-30 MED ORDER — KETOROLAC TROMETHAMINE 30 MG/ML IJ SOLN
INTRAMUSCULAR | Status: DC | PRN
  Administered 2012-08-30: 15 mg via INTRAVENOUS

## 2012-08-30 MED ORDER — POTASSIUM CHLORIDE CRYS ER 20 MEQ PO TBCR
20.0000 meq | EXTENDED_RELEASE_TABLET | Freq: Every day | ORAL | Status: DC
  Administered 2012-08-31: 20 meq via ORAL
  Filled 2012-08-30 (×3): qty 1

## 2012-08-30 MED ORDER — METHOCARBAMOL 100 MG/ML IJ SOLN
500.0000 mg | Freq: Four times a day (QID) | INTRAVENOUS | Status: DC | PRN
  Filled 2012-08-30: qty 5

## 2012-08-30 MED ORDER — ACETAMINOPHEN 650 MG RE SUPP
650.0000 mg | Freq: Four times a day (QID) | RECTAL | Status: DC | PRN
  Filled 2012-08-30: qty 1

## 2012-08-30 SURGICAL SUPPLY — 62 items
BLADE FLAT COURSE (BLADE) IMPLANT
BLADE SURG 10 STRL SS (BLADE) IMPLANT
BLADE SURG 15 STRL LF DISP TIS (BLADE) IMPLANT
BLADE SURG 15 STRL SS (BLADE)
BNDG COHESIVE 4X5 TAN NS LF (GAUZE/BANDAGES/DRESSINGS) IMPLANT
BNDG COHESIVE 6X5 TAN NS LF (GAUZE/BANDAGES/DRESSINGS) IMPLANT
BUR SURG 4X8 MED (BURR) IMPLANT
BURR SURG 4X8 MED (BURR) ×2
CANISTER SUCTION 2500CC (MISCELLANEOUS) ×2 IMPLANT
CLEANER CAUTERY TIP 5X5 PAD (MISCELLANEOUS) ×1 IMPLANT
CLOTH BEACON ORANGE TIMEOUT ST (SAFETY) ×2 IMPLANT
DRAPE INCISE IOBAN 66X45 STRL (DRAPES) IMPLANT
DRAPE POUCH INSTRU U-SHP 10X18 (DRAPES) ×2 IMPLANT
DRSG EMULSION OIL 3X3 NADH (GAUZE/BANDAGES/DRESSINGS) ×2 IMPLANT
DRSG PAD ABDOMINAL 8X10 ST (GAUZE/BANDAGES/DRESSINGS) ×3 IMPLANT
DURAPREP 26ML APPLICATOR (WOUND CARE) ×2 IMPLANT
ELECT REM PT RETURN 9FT ADLT (ELECTROSURGICAL) ×2
ELECTRODE REM PT RTRN 9FT ADLT (ELECTROSURGICAL) ×1 IMPLANT
GAUZE SPONGE 4X4 12PLY STRL LF (GAUZE/BANDAGES/DRESSINGS) ×1 IMPLANT
GLOVE BIO SURGEON STRL SZ8.5 (GLOVE) IMPLANT
GLOVE BIOGEL PI IND STRL 8 (GLOVE) IMPLANT
GLOVE BIOGEL PI IND STRL 8.5 (GLOVE) ×1 IMPLANT
GLOVE BIOGEL PI INDICATOR 8 (GLOVE)
GLOVE BIOGEL PI INDICATOR 8.5 (GLOVE) ×1
GLOVE ECLIPSE 8.0 STRL XLNG CF (GLOVE) ×2 IMPLANT
GLOVE ECLIPSE 8.5 STRL (GLOVE) IMPLANT
GLOVE SURG ORTHO 6.0 STRL STRW (GLOVE) IMPLANT
GOWN PREVENTION PLUS LG XLONG (DISPOSABLE) ×2 IMPLANT
NEEDLE HYPO 22GX1.5 SAFETY (NEEDLE) IMPLANT
NS IRRIG 500ML POUR BTL (IV SOLUTION) ×2 IMPLANT
PACK BASIN DAY SURGERY FS (CUSTOM PROCEDURE TRAY) ×2 IMPLANT
PACK SHOULDER CUSTOM OPM052 (CUSTOM PROCEDURE TRAY) ×4 IMPLANT
PAD CLEANER CAUTERY TIP 5X5 (MISCELLANEOUS) ×1
PASSER SUT SWANSON 36MM LOOP (INSTRUMENTS) IMPLANT
PENCIL BUTTON HOLSTER BLD 10FT (ELECTRODE) ×2 IMPLANT
SLING ARM FOAM STRAP LRG (SOFTGOODS) IMPLANT
SLING ARM IMMOBILIZER LRG (SOFTGOODS) IMPLANT
SPONGE GAUZE 4X4 12PLY (GAUZE/BANDAGES/DRESSINGS) ×2 IMPLANT
SPONGE LAP 4X18 X RAY DECT (DISPOSABLE) ×2 IMPLANT
SPONGE SURGIFOAM ABS GEL 100 (HEMOSTASIS) IMPLANT
STAPLER VISISTAT 35W (STAPLE) ×2 IMPLANT
STOCKINETTE IMPERVIOUS LG (DRAPES) ×2 IMPLANT
STRIP CLOSURE SKIN 1/2X4 (GAUZE/BANDAGES/DRESSINGS) ×2 IMPLANT
SUCTION FRAZIER TIP 10 FR DISP (SUCTIONS) ×2 IMPLANT
SUT BONE WAX W31G (SUTURE) ×1 IMPLANT
SUT ETHIBOND NAB CT1 #1 30IN (SUTURE) ×3 IMPLANT
SUT MNCRL AB 4-0 PS2 18 (SUTURE) ×2 IMPLANT
SUT VIC AB 0 CT1 36 (SUTURE) ×2 IMPLANT
SUT VIC AB 1 CT1 27 (SUTURE) ×4
SUT VIC AB 1 CT1 27XBRD ANTBC (SUTURE) ×2 IMPLANT
SUT VIC AB 1 CT1 36 (SUTURE) IMPLANT
SUT VIC AB 2-0 CT1 27 (SUTURE) ×2
SUT VIC AB 2-0 CT1 27XBRD (SUTURE) IMPLANT
SUT VIC AB 2-0 CT1 TAPERPNT 27 (SUTURE) IMPLANT
SYR BULB IRRIGATION 50ML (SYRINGE) ×2 IMPLANT
SYR CONTROL 10ML LL (SYRINGE) ×2 IMPLANT
TAPE CLOTH SURG 6X10 WHT LF (GAUZE/BANDAGES/DRESSINGS) ×1 IMPLANT
TOWEL OR 17X24 6PK STRL BLUE (TOWEL DISPOSABLE) ×4 IMPLANT
TUBE CONNECTING 12X1/4 (SUCTIONS) ×2 IMPLANT
TissueMend ×1 IMPLANT
WATER STERILE IRR 500ML POUR (IV SOLUTION) ×2 IMPLANT
YANKAUER SUCT BULB TIP NO VENT (SUCTIONS) IMPLANT

## 2012-08-30 NOTE — Interval H&P Note (Signed)
History and Physical Interval Note:  08/30/2012 7:19 AM  [name]  has presented today for surgery, with the diagnosis of LEFT SHOULDER ROTATOR CUFF TEAR  The various methods of treatment have been discussed with the patient and family. After consideration of risks, benefits and other options for treatment, the patient has consented to  Procedure(s): ROTATOR CUFF REPAIR SHOULDER OPEN, POSSIBLE GRAFT AND ANCHORS (Left) as a surgical intervention .  The patient's history has been reviewed, patient examined, no change in status, stable for surgery.  I have reviewed the patient's chart and labs.  Questions were answered to the patient's satisfaction.     Alegria Dominique A

## 2012-08-30 NOTE — Anesthesia Postprocedure Evaluation (Signed)
  Anesthesia Post-op Note  Patient: Catherine Hoffman  Procedure(s) Performed: Procedure(s) (LRB): ROTATOR CUFF REPAIR SHOULDER OPEN, POSSIBLE GRAFT AND ANCHORS (Left)  Patient Location: PACU  Anesthesia Type: General  Level of Consciousness: awake and alert   Airway and Oxygen Therapy: Patient Spontanous Breathing  Post-op Pain: mild  Post-op Assessment: Post-op Vital signs reviewed, Patient's Cardiovascular Status Stable, Respiratory Function Stable, Patent Airway and No signs of Nausea or vomiting  Last Vitals:  Filed Vitals:   08/30/12 0915  BP: 139/67  Pulse: 76  Temp:   Resp: 13    Post-op Vital Signs: stable   Complications: No apparent anesthesia complications

## 2012-08-30 NOTE — Brief Op Note (Signed)
08/30/2012  8:46 AM  PATIENT:  Catherine Hoffman  72 y.o. female  PRE-OPERATIVE DIAGNOSIS:  LEFT SHOULDER ROTATOR CUFF TEAR,Complex.  POST-OPERATIVE DIAGNOSIS:  LEFT SHOULDER ROTATOR CUFF TEAR.Complex  PROCEDURE:  Procedure(s): ROTATOR CUFF REPAIR SHOULDER OPEN, POSSIBLE GRAFT AND ANCHORS (Left), Complex Repair utilizing a Tissue Mend Graft and Open Acromionectomy.  SURGEON:  Surgeon(s) and Role:    * Jacki Cones, MD - Primary  PHYSICIAN ASSISTANT: Dimitri Ped PA  ASSISTANTS: Dimitri Ped PA   ANESTHESIA:   general  EBL:  Total I/O In: 1000 [I.V.:1000] Out: -   BLOOD ADMINISTERED:none  DRAINS: none   LOCAL MEDICATIONS USED:  BUPIVICAINE 20cc.  SPECIMEN:  No Specimen  DISPOSITION OF SPECIMEN:  N/A  COUNTS:  YES  TOURNIQUET:  * No tourniquets in log *  DICTATION: .Other Dictation: Dictation Number [id number]  PLAN OF CARE: Admit for overnight observation  PATIENT DISPOSITION:  Stable in OR.   Delay start of Pharmacological VTE agent (>24hrs) due to surgical blood loss or risk of bleeding: yes

## 2012-08-30 NOTE — Anesthesia Procedure Notes (Signed)
Procedure Name: Intubation Date/Time: 08/30/2012 7:40 AM Performed by: Norva Pavlov Pre-anesthesia Checklist: Patient identified, Emergency Drugs available, Suction available and Patient being monitored Patient Re-evaluated:Patient Re-evaluated prior to inductionOxygen Delivery Method: Circle System Utilized Preoxygenation: Pre-oxygenation with 100% oxygen Intubation Type: IV induction Ventilation: Mask ventilation without difficulty Laryngoscope Size: Mac and 3 Grade View: Grade I Tube type: Oral Number of attempts: 1 Airway Equipment and Method: stylet and LTA kit utilized Placement Confirmation: ETT inserted through vocal cords under direct vision,  positive ETCO2 and breath sounds checked- equal and bilateral Secured at: 21 cm Tube secured with: Tape Dental Injury: Teeth and Oropharynx as per pre-operative assessment

## 2012-08-30 NOTE — H&P (View-Only) (Signed)
To [facility] at 0600 Istat on arrival-Ekg with chart-Npo after Mn-will take omeprazole,K-dur,zyrtec,use flonase that am.Plan overnight stay-explained would be transfered to [facility] post recovery. 

## 2012-08-30 NOTE — Op Note (Signed)
NAMENOLAH, KRENZER NO.:  [id number]  MEDICAL RECORD NO.:  [id number]  LOCATION:                                 FACILITY:  PHYSICIAN:  Georges Lynch. Karin Griffith, M.D.DATE OF BIRTH:  [birth year]  DATE OF PROCEDURE:  08/30/2012 DATE OF DISCHARGE:                              OPERATIVE REPORT   SURGEON:  Georges Lynch. Darrelyn Hillock, MD  ASSISTANT:  Dimitri Ped, PA  PREOPERATIVE DIAGNOSES: 1. Complex retracted tear of the rotator cuff tendon on the left. 2. Severe impingement syndrome on the left.  POSTOPERATIVE DIAGNOSES: 1. Complex retracted tear of the rotator cuff tendon on the left. 2. Severe impingement syndrome on the left.  OPERATION: 1. Open acromionectomy and acromioplasty, left shoulder. 2. Repair of a complete tear of the rotator cuff tendon, left     shoulder. 3. TissueMend graft, left rotator cuff.  PROCEDURE:  Under general anesthesia, routine orthopedic prepping and draping of the left shoulder was carried out with the patient on the beach chair position.  At this time, the appropriate time-out was first carried out.  I also marked the appropriate left arm in the holding area.  She had 2 g of IV Ancef.  An incision then was made over the anterior aspect of the left shoulder.  Bleeders were identified and cauterized.  At this time, I identified the deltoid tendon.  Self- retaining retractors were inserted.  I then stripped the deltoid tendon off the acromion in usual fashion by sharp dissection.  I split a small proximal part of the deltoid muscle.  At that particular time, I then noticed some severe impingement of the acromion into the rotator cuff and a large tear.  I protected the underlying cuff with a Bennett retractor and did a partial acromionectomy acromioplasty with the oscillating saw and a bur.  After this was done, I thoroughly irrigated out the area.  I then bone waxed the undersurface of the acromion.  At this point, I then identified  the tear of the rotator cuff, it was a complex tear.  I did a primary suture repair at first and then reinforced it with a TissueMend graft.  No anchor was necessary.  I thoroughly irrigated out the area.  I then closed the deltoid tendon muscle in usual fashion.  Subcu was closed in usual fashion.  We then did a subcuticular closure on the skin.  Sterile dressings were applied.  The patient was placed in a shoulder immobilizer.          ______________________________ Georges Lynch Darrelyn Hillock, M.D.     RAG/MEDQ  D:  08/30/2012  T:  08/30/2012  Job:  [id number]

## 2012-08-30 NOTE — Transfer of Care (Signed)
Immediate Anesthesia Transfer of Care Note  Patient: Catherine Hoffman  Procedure(s) Performed: Procedure(s) (LRB): ROTATOR CUFF REPAIR SHOULDER OPEN, POSSIBLE GRAFT AND ANCHORS (Left)  Patient Location: PACU  Anesthesia Type: General  Level of Consciousness: sleeping  Airway & Oxygen Therapy: Patient Spontanous Breathing and Patient connected to face mask oxygen, oral airway in place.  Post-op Assessment: Report given to PACU RN and Post -op Vital signs reviewed and stable  Post vital signs: Reviewed and stable  Complications: No apparent anesthesia complications

## 2012-08-31 ENCOUNTER — Encounter (HOSPITAL_BASED_OUTPATIENT_CLINIC_OR_DEPARTMENT_OTHER): Payer: Self-pay | Admitting: Orthopedic Surgery

## 2012-08-31 LAB — POCT HEMOGLOBIN-HEMACUE: Hemoglobin: 10.5 g/dL — ABNORMAL LOW (ref 12.0–15.0)

## 2012-08-31 MED ORDER — OXYCODONE-ACETAMINOPHEN 5-325 MG PO TABS: 1.0000 | ORAL_TABLET | ORAL | Status: DC | PRN

## 2012-08-31 MED ORDER — METHOCARBAMOL 500 MG PO TABS: 500.0000 mg | ORAL_TABLET | Freq: Four times a day (QID) | ORAL | Status: DC | PRN

## 2012-08-31 NOTE — Progress Notes (Signed)
Subjective: 1 Day Post-Op Procedure(s) (LRB): ROTATOR CUFF REPAIR SHOULDER OPEN, POSSIBLE GRAFT AND ANCHORS (Left) Patient reports pain as 4 on 0-10 scale. Doing well today. Good function in Hand. Instructions for home care given.   Objective: Vital signs in last 24 hours: Temp:  [97.3 F (36.3 C)-98.6 F (37 C)] 98.5 F (36.9 C) (03/26 0622) Pulse Rate:  [61-68] 65 (03/26 0918) Resp:  [9-16] 14 (03/26 0622) BP: (106-136)/(55-82) 112/70 mmHg (03/26 0622) SpO2:  [93 %-98 %] 94 % (03/26 0918)  Intake/Output from previous day: 03/25 0701 - 03/26 0700 In: 3800 [P.O.:480; I.V.:3220; IV Piggyback:100] Out: 1875 [Urine:1875] Intake/Output this shift:    No results found for this basename: HGB,  in the last 72 hours No results found for this basename: WBC, RBC, HCT, PLT,  in the last 72 hours  Recent Labs  08/30/12 0636  NA 141  K 3.9  CL 106  CO2 25  BUN 12  CREATININE 0.96  GLUCOSE 98  CALCIUM 9.2    Recent Labs  08/30/12 0636  INR 0.89    Neurovascular intact  Assessment/Plan: 1 Day Post-Op Procedure(s) (LRB): ROTATOR CUFF REPAIR SHOULDER OPEN, POSSIBLE GRAFT AND ANCHORS (Left) Discharge home with home health. DC home today.  Catherine Hoffman A 08/31/2012, 10:18 AM

## 2012-08-31 NOTE — Evaluation (Signed)
Occupational Therapy Evaluation Patient Details Name: Catherine Hoffman MRN: [id number] DOB: [birth year] Today's Date: 08/31/2012 Time: 4098-1191 OT Time Calculation (min): 35 min  OT Assessment / Plan / Recommendation Clinical Impression  Pt is a 72 yo female admitted for L acromionectomy and repair of rotator cuff injury with deficits listed below.  Will benefit from cont OT as MD orders.    OT Assessment  Patient needs continued OT Services    Follow Up Recommendations  Outpatient OT;Supervision/Assistance - 24 hour;Other (comment) (as MD orders)    Barriers to Discharge None Rec pt have 24 hour S for first few days after d/c  Equipment Recommendations  None recommended by OT    Recommendations for Other Services    Frequency  Min 2X/week    Precautions / Restrictions Precautions Precautions: Shoulder Type of Shoulder Precautions: NWB Gelila. NO ROM L shoulder Precaution Booklet Issued: Yes (comment) Precaution Comments: handout given Required Braces or Orthoses: Other Brace/Splint (L sling) Other Brace/Splint: L sling Restrictions Weight Bearing Restrictions: Yes Corin Weight Bearing: Non weight bearing Other Position/Activity Restrictions: No active movement of L shoulder   Pertinent Vitals/Pain Pt with 5/10 pain only when moving around.  HR 60.  O2 sat 94% on RA.    ADL  Eating/Feeding: Performed;Set up Where Assessed - Eating/Feeding: Chair Grooming: Performed;Set up Where Assessed - Grooming: Supported sitting Upper Body Bathing: Simulated;Minimal assistance Where Assessed - Upper Body Bathing: Supported sitting Lower Body Bathing: Simulated;Supervision/safety Where Assessed - Lower Body Bathing: Supported sit to stand Upper Body Dressing: Performed;Minimal assistance Where Assessed - Upper Body Dressing: Unsupported sitting Lower Body Dressing: Performed;Supervision/safety Where Assessed - Lower Body Dressing: Supported sit to Pharmacist, hospital:  Research scientist (life sciences) Method: Sit to stand;Stand pivot Acupuncturist: Comfort height toilet;Grab bars Toileting - Architect and Hygiene: Simulated;Min guard Where Assessed - Engineer, mining and Hygiene: Standing Transfers/Ambulation Related to ADLs: S with all transfers. ADL Comments: assist with sling and UE dressing.    OT Diagnosis: Generalized weakness;Acute pain  OT Problem List: Decreased knowledge of use of DME or AE;Decreased knowledge of precautions;Impaired UE functional use;Pain;Decreased range of motion;Decreased strength OT Treatment Interventions: Self-care/ADL training;Therapeutic exercise   OT Goals Acute Rehab OT Goals OT Goal Formulation: With patient Time For Goal Achievement: 09/07/12 Potential to Achieve Goals: Good ADL Goals Additional ADL Goal #1: Pt will demonstrate donning and doffing sling Ily. ADL Goal: Additional Goal #1 - Progress: Goal set today Additional ADL Goal #2: Pt will do hand/wrist and elbow exercises Ily. ADL Goal: Additional Goal #2 - Progress: Goal set today  Visit Information  Last OT Received On: 08/31/12 Assistance Needed: +1    Subjective Data  Subjective: "I feel pretty good."  "Just groggy." Patient Stated Goal: to go home   Prior Functioning     Home Living Lives With: Alone Available Help at Discharge: Friend(s);Available 24 hours/day Type of Home: House Home Access: Stairs to enter [employer] of Steps: 2 Entrance Stairs-Rails: None Home Layout: One level Bathroom Shower/Tub: Forensic scientist: Handicapped height Bathroom Accessibility: Yes Home Adaptive Equipment: None Prior Function Level of Independence: Independent Able to Take Stairs?: Yes Driving: Yes Vocation: Retired Musician: No difficulties Dominant Hand: Right         Vision/Perception Vision - History Baseline Vision: No visual  deficits Patient Visual Report: No change from baseline Vision - Assessment Vision Assessment: Vision not tested   [employer] Overall Cognitive Status: Appears within functional limits for tasks  assessed/performed Arousal/Alertness: Awake/alert Orientation Level: Oriented X4 / Intact Behavior During Session: WFL for tasks performed    Extremity/Trunk Assessment Right Upper Extremity Assessment RUE ROM/Strength/Tone: Within functional levels RUE Sensation: WFL - Light Touch RUE Coordination: WFL - gross/fine motor Left Upper Extremity Assessment Cherica ROM/Strength/Tone: Unable to fully assess Linet Sensation: WFL - Light Touch [place] Coordination: WFL - gross/fine motor Trunk Assessment Trunk Assessment: Normal     Mobility Bed Mobility Bed Mobility: Supine to Sit Supine to Sit: 5: Supervision Details for Bed Mobility Assistance: no assist needed. Transfers Transfers: Sit to Stand;Stand to Sit Sit to Stand: 5: Supervision Stand to Sit: 5: Supervision Details for Transfer Assistance: cues for hand placement     Exercise Shoulder Exercises Elbow Flexion: AROM;10 reps;Seated Elbow Extension: AROM;10 reps;Seated Wrist Flexion: AROM;10 reps;Seated Wrist Extension: AROM;10 reps;Seated Digit Composite Flexion: AROM;10 reps;Seated Composite Extension: AROM;10 reps;Seated Donning/doffing shirt without moving shoulder: Minimal assistance Method for sponge bathing under operated UE: Supervision/safety Donning/doffing sling/immobilizer: Minimal assistance Correct positioning of sling/immobilizer: Supervision/safety ROM for elbow, wrist and digits of operated UE: Supervision/safety Sling wearing schedule (on at all times/off for ADL's): Independent Proper positioning of operated UE when showering: Independent Positioning of UE while sleeping: Independent   Balance Balance Balance Assessed: No   End of Session OT - End of Session Activity Tolerance: Patient tolerated  treatment well Patient left: in chair;with call bell/phone within reach;with family/visitor present Nurse Communication: Mobility status  GO Functional Assessment Tool Used: clinical observation Functional Limitation: Self care Self Care Current Status (Z6109): At least 1 percent but less than 20 percent impaired, limited or restricted Self Care Goal Status (U0454): At least 1 percent but less than 20 percent impaired, limited or restricted   Hope Budds 08/31/2012, 9:30 AM [phone]

## 2012-08-31 NOTE — Progress Notes (Signed)
CSW met with pt / daughter this am per request from [facility]. Pt/ daughter requesting SNF info. CSW reviewed OT eval. OT recommending 24/7 supervision. No skilled needs present. 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 CSW explained that [employer] has not , in this CSW experience , covered SNF placement when only supervision is needed. 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 CSW explained since pt is listed as OBS and has d/c orders for today pt does not qualify fo rmedicare to cover SNF placement. CSW offered assistance with pvt pay SNF placement, but pt/daughter declined. Daughter indicated that arrangements had been made for 24/7 supervision at home. No other assistance requested.  Cori Razor LCSW [phone]

## 2012-09-01 NOTE — Discharge Summary (Signed)
Physician Discharge Summary   Patient ID: Merry Pond MRN: [id number] DOB/AGE: Jun 28, 1940 72 y.o.  Admit date: 08/30/2012 Discharge date: 08/31/2012  Primary Diagnosis: Left shoulder rotator cuff tear   Admission Diagnoses:  Past Medical History  Diagnosis Date  . Esophageal reflux   . Hypertension   . Episodic tension type headache   . Hyperlipidemia   . Arthritis   . Left rotator cuff tear   . Kidney stone on left side Hx-date unknown   Discharge Diagnoses:   Active Problems:   Complete tear of rotator cuff  Estimated body mass index is 31.4 kg/(m^2) as calculated from the following:   Height as of this encounter: 5\' 4"  (1.626 m).   Weight as of this encounter: 83.008 kg (183 lb).  Procedure:  Procedure(s) (LRB): ROTATOR CUFF REPAIR SHOULDER OPEN, POSSIBLE GRAFT AND ANCHORS (Left)   Consults: None  HPI: The patient is a 72 year old female who presented to Dr. Jeannetta Ellis office 2 months ago with left shoulder pain. She was treated for bursitis, receiving a cortisone injection, which helped only a few weeks. She continued to have progressively worsening pain in the left shoulder as well as loss of motion. MRI revealed full thickness left shoulder rotator cuff tear with retraction.    Laboratory Data: Admission on 08/30/2012, Discharged on 08/31/2012  Component Date Value Range Status  . Sodium 08/30/2012 141  135 - 145 mEq/L Final  . Potassium 08/30/2012 3.9  3.5 - 5.1 mEq/L Final  . Chloride 08/30/2012 106  96 - 112 mEq/L Final  . CO2 08/30/2012 25  19 - 32 mEq/L Final  . Glucose, Bld 08/30/2012 98  70 - 99 mg/dL Final  . BUN 52/84/1324 12  6 - 23 mg/dL Final  . Creatinine, Ser 08/30/2012 0.96  0.50 - 1.10 mg/dL Final  . Calcium 40/03/2724 9.2  8.4 - 10.5 mg/dL Final  . Total Protein 08/30/2012 7.2  6.0 - 8.3 g/dL Final  . Albumin 36/64/4034 3.7  3.5 - 5.2 g/dL Final  . AST 74/25/9563 15  0 - 37 U/L Final  . ALT 08/30/2012 10  0 - 35 U/L Final  . Alkaline  Phosphatase 08/30/2012 84  39 - 117 U/L Final  . Total Bilirubin 08/30/2012 0.4  0.3 - 1.2 mg/dL Final  . GFR calc non Af Amer 08/30/2012 58* >90 mL/min Final  . GFR calc Af Amer 08/30/2012 67* >90 mL/min Final   Comment:                                 The eGFR has been calculated                          using the CKD EPI equation.                          This calculation has not been                          validated in all clinical                          situations.                          eGFR's persistently                          <  90 mL/min signify                          possible Chronic Kidney Disease.  Marland Kitchen Prothrombin Time 08/30/2012 12.0  11.6 - 15.2 seconds Final  . INR 08/30/2012 0.89  0.00 - 1.49 Final  . aPTT 08/30/2012 32  24 - 37 seconds Final  . Color, Urine 08/30/2012 YELLOW  YELLOW Final  . APPearance 08/30/2012 CLEAR  CLEAR Final  . Specific Gravity, Urine 08/30/2012 1.025  1.005 - 1.030 Final  . pH 08/30/2012 5.0  5.0 - 8.0 Final  . Glucose, UA 08/30/2012 NEGATIVE  NEGATIVE mg/dL Final  . Hgb urine dipstick 08/30/2012 NEGATIVE  NEGATIVE Final  . Bilirubin Urine 08/30/2012 NEGATIVE  NEGATIVE Final  . Ketones, ur 08/30/2012 NEGATIVE  NEGATIVE mg/dL Final  . Protein, ur 16/03/9603 NEGATIVE  NEGATIVE mg/dL Final  . Urobilinogen, UA 08/30/2012 0.2  0.0 - 1.0 mg/dL Final  . Nitrite 54/02/8118 NEGATIVE  NEGATIVE Final  . Leukocytes, UA 08/30/2012 NEGATIVE  NEGATIVE Final   MICROSCOPIC NOT DONE ON URINES WITH NEGATIVE PROTEIN, BLOOD, LEUKOCYTES, NITRITE, OR GLUCOSE <1000 mg/dL.  Marland Kitchen Hemoglobin 08/30/2012 10.5* 12.0 - 15.0 g/dL Final     EKG: Orders placed in visit on 05/30/12  . EKG 12-LEAD  . EKG 12-LEAD     Hospital Course: Zola Runion is a 72 y.o. who was admitted to [facility]. They were brought to the operating room on 08/30/2012 and underwent Procedure(s): ROTATOR CUFF REPAIR SHOULDER OPEN, POSSIBLE GRAFT AND ANCHORS.  Patient tolerated the  procedure well and was later transferred to the recovery room and then to the orthopaedic floor for postoperative care.  They were given PO and IV analgesics for pain control following their surgery.  They were given 24 hours of postoperative antibiotics of  Anti-infectives   Start     Dose/Rate Route Frequency Ordered Stop   08/30/12 1400  ceFAZolin (ANCEF) IVPB 1 g/50 mL premix     1 g 100 mL/hr over 30 Minutes Intravenous Every 6 hours 08/30/12 1132 08/31/12 0159   08/30/12 1000  ceFAZolin (ANCEF) IVPB 1 g/50 mL premix  Status:  Discontinued     1 g 100 mL/hr over 30 Minutes Intravenous Every 6 hours 08/30/12 0951 08/30/12 1132   08/30/12 0807  polymyxin B 500,000 Units, bacitracin 50,000 Units in sodium chloride irrigation 0.9 % 500 mL irrigation  Status:  Discontinued       As needed 08/30/12 0807 08/30/12 0902   08/30/12 0642  ceFAZolin (ANCEF) IVPB 2 g/50 mL premix     2 g 100 mL/hr over 30 Minutes Intravenous On call to O.R. 08/30/12 1478 08/30/12 0732    OT was ordered for instruction on ADLs with the sling.  Discharge planning consulted to help with postop disposition and equipment needs.  Patient had a good night on the evening of surgery although she did have some discomfort in the left shoulder.  Dressing was changed and the incision was clean and dry. Patient was seen in rounds and was ready to go home.   Discharge Medications: Prior to Admission medications   Medication Sig Start Date End Date Taking? Authorizing Provider  aspirin 81 MG tablet Take 81 mg by mouth daily.     Yes Historical Provider, MD  calcium-vitamin D (OSCAL WITH D) 500-200 MG-UNIT per tablet Take 1 tablet by mouth 2 (two) times daily.     Yes Historical Provider, MD  cetirizine (  ZYRTEC) 10 MG tablet Take 10 mg by mouth at bedtime as needed.    Yes Historical Provider, MD  estradiol (ESTRACE) 0.5 MG tablet Take 0.5 mg by mouth daily.   Yes Historical Provider, MD  fluticasone (FLONASE) 50 MCG/ACT nasal spray  0-2 puffs each nostril in the morning and 1-2 puffs at bedtime 08/31/11 08/30/12 Yes Nyoka Cowden, MD  meloxicam (MOBIC) 7.5 MG tablet Take 1 tablet (7.5 mg total) by mouth every 12 (twelve) hours as needed (for arthritis / knee pain). 12/21/11  Yes Tammy S Parrett, NP  omeprazole (PRILOSEC) 40 MG capsule Take  One  30 minutes before first and last meal of the day 12/21/11  Yes Tammy S Parrett, NP  potassium chloride SA (K-DUR,KLOR-CON) 20 MEQ tablet Take 20 mEq by mouth daily.   Yes Historical Provider, MD  spironolactone (ALDACTONE) 25 MG tablet Take 1 tablet (25 mg total) by mouth 2 (two) times daily. 12/21/11  Yes Tammy S Parrett, NP  acetaminophen (TYLENOL) 500 MG tablet Per bottle as needed for pain    Historical Provider, MD  docusate sodium (COLACE) 100 MG capsule 1-2 at bedtime as needed    Historical Provider, MD  meclizine (ANTIVERT) 25 MG tablet Take 25 mg by mouth every 4 (four) hours as needed for dizziness.    Historical Provider, MD  methocarbamol (ROBAXIN) 500 MG tablet Take 1 tablet (500 mg total) by mouth every 6 (six) hours as needed. 08/31/12   Kevontae Burgoon Tamala Ser, PA-C  oxyCODONE-acetaminophen (PERCOCET/ROXICET) 5-325 MG per tablet Take 1 tablet by mouth every 4 (four) hours as needed. (this has tylenol in it) 08/31/12   Daivd Fredericksen Lauren Celedonio Savage, PA-C  promethazine (PHENERGAN) 25 MG tablet 1 every 4-6 hours as needed for nausea/vomiting    Historical Provider, MD  traMADol (ULTRAM) 50 MG tablet 1-2 every 4 hours as needed    Historical Provider, MD  traZODone (DESYREL) 50 MG tablet 1-2 at bedtime as needed for trouble sleeping    Historical Provider, MD    Diet: Regular diet Activity:wear sling at all times Follow-up:in 10 days Disposition - Home Discharged Condition: fair   Discharge Orders   Future Orders Complete By Expires     Call MD / Call 911  As directed     Comments:      If you experience chest pain or shortness of breath, CALL 911 and be transported to the  hospital emergency room.  If you develope a fever above 101 F, pus (white drainage) or increased drainage or redness at the wound, or calf pain, call your surgeon's office.    Constipation Prevention  As directed     Comments:      Drink plenty of fluids.  Prune juice may be helpful.  You may use a stool softener, such as Colace (over the counter) 100 mg twice a day.  Use MiraLax (over the counter) for constipation as needed.    Diet - low sodium heart healthy  As directed     Discharge instructions  As directed     Comments:      Keep your sling on at all times, including sleeping in your sling. The only time you should remove your sling is to shower only but you need to keep your hand against your chest while you shower.  For the first few days, remove your dressing, tape a piece of saran wrap over your incision, take your shower, then remove the saran wrap and put a clean dressing  on, then reapply your sling. After two days you can shower without the saran wrap.  Call Dr. Darrelyn Hillock if any wound complications or temperature of 101 degrees F or over.  Call the office for an appointment to see Dr. Darrelyn Hillock in ten days: [phone] and ask for Dr. Jeannetta Ellis nurse, Mackey Birchwood.    Driving restrictions  As directed     Comments:      No driving    Increase activity slowly as tolerated  As directed     Lifting restrictions  As directed     Comments:      No lifting        Medication List    TAKE these medications       aspirin 81 MG tablet  Take 81 mg by mouth daily.     calcium-vitamin D 500-200 MG-UNIT per tablet  Commonly known as:  OSCAL WITH D  Take 1 tablet by mouth 2 (two) times daily.     cetirizine 10 MG tablet  Commonly known as:  ZYRTEC  Take 10 mg by mouth at bedtime as needed.     estradiol 0.5 MG tablet  Commonly known as:  ESTRACE  Take 0.5 mg by mouth daily.     fluticasone 50 MCG/ACT nasal spray  Commonly known as:  FLONASE  0-2 puffs each nostril in the  morning and 1-2 puffs at bedtime     meloxicam 7.5 MG tablet  Commonly known as:  MOBIC  Take 1 tablet (7.5 mg total) by mouth every 12 (twelve) hours as needed (for arthritis / knee pain).     methocarbamol 500 MG tablet  Commonly known as:  ROBAXIN  Take 1 tablet (500 mg total) by mouth every 6 (six) hours as needed.     omeprazole 40 MG capsule  Commonly known as:  PRILOSEC  Take  One  30 minutes before first and last meal of the day     oxyCODONE-acetaminophen 5-325 MG per tablet  Commonly known as:  PERCOCET/ROXICET  Take 1 tablet by mouth every 4 (four) hours as needed. (this has tylenol in it)     potassium chloride SA 20 MEQ tablet  Commonly known as:  K-DUR,KLOR-CON  Take 20 mEq by mouth daily.     spironolactone 25 MG tablet  Commonly known as:  ALDACTONE  Take 1 tablet (25 mg total) by mouth 2 (two) times daily.      ASK your doctor about these medications       acetaminophen 500 MG tablet  Commonly known as:  TYLENOL  Per bottle as needed for pain     docusate sodium 100 MG capsule  Commonly known as:  COLACE  1-2 at bedtime as needed     meclizine 25 MG tablet  Commonly known as:  ANTIVERT  Take 25 mg by mouth every 4 (four) hours as needed for dizziness.     promethazine 25 MG tablet  Commonly known as:  PHENERGAN  1 every 4-6 hours as needed for nausea/vomiting     traMADol 50 MG tablet  Commonly known as:  ULTRAM  1-2 every 4 hours as needed     traZODone 50 MG tablet  Commonly known as:  DESYREL  1-2 at bedtime as needed for trouble sleeping           Follow-up Information   Follow up with GIOFFRE,RONALD A, MD. Schedule an appointment as soon as possible for a visit in 10 days.   Contact  information:   [address], [address] [place] [address] [phone]       Signed: Kerby Nora 09/01/2012, 4:53 PM

## 2012-09-01 NOTE — Care Management Note (Signed)
    Page 1 of 1   09/01/2012     6:35:04 PM   CARE MANAGEMENT NOTE 09/01/2012  Patient:  Shaff,LUEBELLE   Account Number:  [id number]  Date Initiated:  08/31/2012  Documentation initiated by:  Colleen Can  Subjective/Objective Assessment:   dx rotator cuff repair     Action/Plan:   CM spoke with patient and daughter. Questions answered regarding HH care and snf rehab. Advised of skilled [facility] services vs unskilled services. Pt does not have orders for skilled [facility] services.   Anticipated DC Date:  08/31/2012   Anticipated DC Plan:  HOME/SELF CARE  In-house referral  Clinical Social Worker      DC Planning Services  CM consult      Choice offered to / List presented to:             Status of service:  Completed, signed off Medicare Important Message given?  NO (If response is "NO", the following Medicare IM given date fields will be blank) Date Medicare IM given:   Date Additional Medicare IM given:    Discharge Disposition:  HOME/SELF CARE  Per UR Regulation:  Reviewed for med. necessity/level of care/duration of stay  If discussed at Long Length of Stay Meetings, dates discussed:    Comments:  08/31/2012 Colleen Can BSN RN CCM [phone] Pt and daughter given a list of persoanl care (private Pay) agencies to use if needed. CSW referred to patient and daughter to answer questions regarding SNF rehab Pt plans to go home where she will have friend that will be able to assist her at her home.There are no DME needs.

## 2012-10-24 DIAGNOSIS — M25519 Pain in unspecified shoulder: Secondary | ICD-10-CM | POA: Diagnosis not present

## 2012-10-28 DIAGNOSIS — M25519 Pain in unspecified shoulder: Secondary | ICD-10-CM | POA: Diagnosis not present

## 2012-11-01 DIAGNOSIS — M25519 Pain in unspecified shoulder: Secondary | ICD-10-CM | POA: Diagnosis not present

## 2012-11-03 DIAGNOSIS — M25519 Pain in unspecified shoulder: Secondary | ICD-10-CM | POA: Diagnosis not present

## 2012-11-08 DIAGNOSIS — M25519 Pain in unspecified shoulder: Secondary | ICD-10-CM | POA: Diagnosis not present

## 2012-11-10 DIAGNOSIS — M25519 Pain in unspecified shoulder: Secondary | ICD-10-CM | POA: Diagnosis not present

## 2012-11-14 DIAGNOSIS — M25519 Pain in unspecified shoulder: Secondary | ICD-10-CM | POA: Diagnosis not present

## 2012-11-17 ENCOUNTER — Other Ambulatory Visit: Payer: Self-pay | Admitting: Adult Health

## 2012-11-17 DIAGNOSIS — M25519 Pain in unspecified shoulder: Secondary | ICD-10-CM | POA: Diagnosis not present

## 2012-11-22 DIAGNOSIS — M25519 Pain in unspecified shoulder: Secondary | ICD-10-CM | POA: Diagnosis not present

## 2012-11-25 ENCOUNTER — Other Ambulatory Visit: Payer: Self-pay | Admitting: Adult Health

## 2012-11-25 ENCOUNTER — Telehealth: Payer: Self-pay | Admitting: Internal Medicine

## 2012-11-25 DIAGNOSIS — M25519 Pain in unspecified shoulder: Secondary | ICD-10-CM | POA: Diagnosis not present

## 2012-11-25 MED ORDER — PREDNISONE 10 MG PO TABS: ORAL_TABLET | ORAL | Status: DC

## 2012-11-25 NOTE — Telephone Encounter (Signed)
Prednisone 10 mg take  4 each am x 2 days,   2 each am x 2 days,  1 each am x 2 days and stop   Delsym 2 tsp every 12h supplement with percet one every 4  #30  Be sure using prilosec 20 mg bid ac   Fu ov with Tammy P one week, to UC or er prn in meantime

## 2012-11-25 NOTE — Telephone Encounter (Signed)
I spoke with pt and is aware of recs. She will try the delsym alone first. She scheduled appt. Prednisone sent. Nothing further was needed

## 2012-11-25 NOTE — Telephone Encounter (Signed)
I spoke with pt and she c/o coughing until she gags, a lot of chest pressure, very hoarse, chest congestion x 1 week. Per pt she feels like she is getting worse. MW please as advised as your are the only side A doc and TP has no openings. Thanks.  Allergies  Allergen Reactions  . Hydrocodone-Acetaminophen     REACTION: GI upset

## 2012-11-28 DIAGNOSIS — M25519 Pain in unspecified shoulder: Secondary | ICD-10-CM | POA: Diagnosis not present

## 2012-11-29 ENCOUNTER — Ambulatory Visit (INDEPENDENT_AMBULATORY_CARE_PROVIDER_SITE_OTHER): Payer: BC Managed Care – PPO | Admitting: Pulmonary Disease

## 2012-11-29 ENCOUNTER — Telehealth: Payer: Self-pay | Admitting: Internal Medicine

## 2012-11-29 ENCOUNTER — Encounter: Payer: Self-pay | Admitting: Pulmonary Disease

## 2012-11-29 VITALS — BP 112/70 | HR 77 | Temp 97.3°F | Ht 63.0 in | Wt 180.4 lb

## 2012-11-29 DIAGNOSIS — J209 Acute bronchitis, unspecified: Secondary | ICD-10-CM | POA: Diagnosis not present

## 2012-11-29 MED ORDER — CEFDINIR 300 MG PO CAPS: 600.0000 mg | ORAL_CAPSULE | Freq: Every day | ORAL | Status: DC

## 2012-11-29 NOTE — Patient Instructions (Addendum)
Will treat with omnicef 300mg , take 2 each am for next 5 days. Increase your prilosec 20mg  to 2 in am and pm for next one week, then go back to prior dose.  followup with Dr. [name].

## 2012-11-29 NOTE — Progress Notes (Signed)
  Subjective:    Patient ID: Catherine Hoffman, female    DOB: [birth year], 72 y.o.   MRN: [id number]  HPI The patient comes in today for an acute sick visit.  She was in her usual state of health until approximately one week ago, when she began to develop a dry hacking cough.  She at first thought it was secondary to postnasal drip, but then she began to develop chest congestion, and is now bringing up significant quantities of yellow mucus.  She is being treated with a course of prednisone and a cough suppressant, but continues to have significant congestion and purulent mucus.  She denies any fevers, chills, or sweats.  She does not have any increased shortness of breath.   Review of Systems  Constitutional: Negative for fever and unexpected weight change.  HENT: Negative for ear pain, nosebleeds, congestion, sore throat, rhinorrhea, sneezing, trouble swallowing, dental problem, postnasal drip and sinus pressure.   Eyes: Negative for redness and itching.  Respiratory: Positive for cough, chest tightness, shortness of breath and wheezing.   Cardiovascular: Positive for chest pain ( with cough) and palpitations ( with cough). Negative for leg swelling.  Gastrointestinal: Negative for nausea and vomiting.  Genitourinary: Negative for dysuria.  Musculoskeletal: Negative for joint swelling.  Skin: Negative for rash.  Neurological: Negative for headaches.  Hematological: Does not bruise/bleed easily.  Psychiatric/Behavioral: Negative for dysphoric mood. The patient is not nervous/anxious.        Objective:   Physical Exam Ill-appearing female in no acute distress Nose without purulence or discharge noted Oropharynx clear Neck without lymphadenopathy or thyromegaly Chest totally clear to auscultation, no wheezing Cardiac exam with regular rate and rhythm Lower extremities with no edema, no cyanosis Alert and oriented, moves all 4 extremities.       Assessment & Plan:

## 2012-11-29 NOTE — Addendum Note (Signed)
Addended by: Nita Sells on: 11/29/2012 04:39 PM   Modules accepted: Orders

## 2012-11-29 NOTE — Telephone Encounter (Signed)
Spoke with pt and states that prod cough (yellow) is not much better.  Has some SOB with cough and some sinus drainage.  Denies FCS.  C/o increased heart rate with cough.  Pt still on Pred taper , Delsym as needed and Prilosec bid.  Pt given ov with Dr Shelle Iron 11-29-12 at 3:30.

## 2012-11-29 NOTE — Assessment & Plan Note (Signed)
The patient's history is suggestive of an acute bronchitis.  She is now bringing up significant quantities of yellow mucus, with increased chest congestion.  I would like to treat her with a course of antibiotics, but also feel that we need to treat her reflux a little more aggressively until she gets through this.

## 2012-12-01 ENCOUNTER — Ambulatory Visit: Payer: BC Managed Care – PPO | Admitting: Adult Health

## 2012-12-01 DIAGNOSIS — M25519 Pain in unspecified shoulder: Secondary | ICD-10-CM | POA: Diagnosis not present

## 2012-12-06 DIAGNOSIS — M25519 Pain in unspecified shoulder: Secondary | ICD-10-CM | POA: Diagnosis not present

## 2012-12-08 DIAGNOSIS — M25519 Pain in unspecified shoulder: Secondary | ICD-10-CM | POA: Diagnosis not present

## 2012-12-13 ENCOUNTER — Encounter: Payer: Self-pay | Admitting: Internal Medicine

## 2012-12-13 ENCOUNTER — Other Ambulatory Visit (INDEPENDENT_AMBULATORY_CARE_PROVIDER_SITE_OTHER): Payer: BC Managed Care – PPO

## 2012-12-13 ENCOUNTER — Ambulatory Visit (INDEPENDENT_AMBULATORY_CARE_PROVIDER_SITE_OTHER): Payer: BC Managed Care – PPO | Admitting: Internal Medicine

## 2012-12-13 ENCOUNTER — Other Ambulatory Visit: Payer: BC Managed Care – PPO

## 2012-12-13 ENCOUNTER — Ambulatory Visit (INDEPENDENT_AMBULATORY_CARE_PROVIDER_SITE_OTHER)
Admission: RE | Admit: 2012-12-13 | Discharge: 2012-12-13 | Disposition: A | Discharge status: Home / Self Care | Payer: BC Managed Care – PPO | Source: Ambulatory Visit | Attending: Internal Medicine | Admitting: Internal Medicine

## 2012-12-13 VITALS — BP 126/84 | HR 65 | Temp 97.0°F | Ht 62.0 in | Wt 182.8 lb

## 2012-12-13 DIAGNOSIS — R05 Cough: Secondary | ICD-10-CM

## 2012-12-13 LAB — CBC WITH DIFFERENTIAL/PLATELET
Basophils Relative: 0.7 % (ref 0.0–3.0)
Eosinophils Relative: 2 % (ref 0.0–5.0)
HCT: 39.7 % (ref 36.0–46.0)
Lymphs Abs: 2.3 10*3/uL (ref 0.7–4.0)
MCV: 92.4 fl (ref 78.0–100.0)
Monocytes Absolute: 0.4 10*3/uL (ref 0.1–1.0)
Platelets: 195 10*3/uL (ref 150.0–400.0)
RBC: 4.29 Mil/uL (ref 3.87–5.11)
WBC: 5.9 10*3/uL (ref 4.5–10.5)

## 2012-12-13 MED ORDER — PREDNISONE (PAK) 10 MG PO TABS: ORAL_TABLET | ORAL | Status: DC

## 2012-12-13 MED ORDER — OXYCODONE-ACETAMINOPHEN 5-325 MG PO TABS: 1.0000 | ORAL_TABLET | ORAL | Status: DC | PRN

## 2012-12-13 NOTE — Patient Instructions (Addendum)
Prednisone 10 mg take  4 each am x 2 days,   2 each am x 2 days,  1 each am x 2 days and stop   Add pepcid 20 mg one at bedtime along with chlortrimeton 4 mg one at bedtime until return  GERD (REFLUX)  is an extremely common cause of respiratory symptoms, many times with no significant heartburn at all.    It can be treated with medication, but also with lifestyle changes including avoidance of late meals, excessive alcohol, smoking cessation, and avoid fatty foods, chocolate, peppermint, colas, red wine, and acidic juices such as orange juice.  NO MINT OR MENTHOL PRODUCTS SO NO COUGH DROPS  USE SUGARLESS CANDY INSTEAD (jolley ranchers or Stover's)  NO OIL BASED VITAMINS - use powdered substitutes.    See Tammy NP w/in 2 weeks with all your medications, even over the counter meds, separated in two separate bags, the ones you take no matter what vs the ones you stop once you feel better and take only as needed when you feel you need them.   Tammy  will generate for you a new user friendly medication calendar that will put [place] all on the same page re: your medication use.     Without this process, it simply isn't possible to assure that we are providing  your outpatient care  with  the attention to detail we feel you deserve.   If we cannot assure that you're getting that kind of care,  then we cannot manage your problem effectively from this clinic.  Once you have seen Tammy and we are sure that we're all on the same page with your medication use she will arrange follow up ( late add: with me 3 months if cough resolved, sooner if not with addition of chlortimeton at hs)

## 2012-12-13 NOTE — Assessment & Plan Note (Signed)
The most common causes of chronic cough in immunocompetent adults include the following: upper airway cough syndrome (UACS), previously referred to as postnasal drip syndrome (PNDS), which is caused by variety of rhinosinus conditions; (2) asthma; (3) GERD; (4) chronic bronchitis from cigarette smoking or other inhaled environmental irritants; (5) nonasthmatic eosinophilic bronchitis; and (6) bronchiectasis.   These conditions, singly or in combination, have accounted for up to 94% of the causes of chronic cough in prospective studies.   Other conditions have constituted no >6% of the causes in prospective studies These have included bronchogenic carcinoma, chronic interstitial pneumonia, sarcoidosis, left ventricular failure, ACEI-induced cough, and aspiration from a condition associated with pharyngeal dysfunction.    Chronic cough is often simultaneously caused by more than one condition. A single cause has been found from 38 to 82% of the time, multiple causes from 18 to 62%. Multiply caused cough has been the result of three diseases up to 42% of the time.       Most likely this is  Classic Upper airway cough syndrome, so named because it's frequently impossible to sort out how much is  CR/sinusitis with freq throat clearing (which can be related to primary GERD)   vs  causing  secondary (" extra esophageal")  GERD from wide swings in gastric pressure that occur with throat clearing, often  promoting self use of mint and menthol lozenges that reduce the lower esophageal sphincter tone and exacerbate the problem further in a cyclical fashion.   These are the same pts (now being labeled as having "irritable larynx syndrome" by some cough centers) who not infrequently have a history of having failed to tolerate ace inhibitors,  dry powder inhalers or biphosphonates or report having atypical reflux symptoms that don't respond to standard doses of PPI , and are easily confused as having aecopd or asthma  flares by even experienced allergists/ pulmonologists.   Will try to eliminate cyclical cough with short course of pred, max gerd rx and percocet as can't use tramadol and regroup with med reconciliation next.   To keep things simple, I have asked the patient to first separate medicines that are perceived as maintenance, that is to be taken daily "no matter what", from those medicines that are taken on only on an as-needed basis and I have given the patient examples of both, and then return to see our NP to generate a  detailed  medication calendar which should be followed until the next physician sees the patient and updates it.

## 2012-12-13 NOTE — Progress Notes (Signed)
Quick Note:  Spoke with pt and notified of results per Dr. Wert. Pt verbalized understanding and denied any questions.  ______ 

## 2012-12-13 NOTE — Progress Notes (Signed)
Subjective:    Patient ID: Catherine Hoffman, female    DOB: May 20, 1941 .   MRN: [id number]  Brief patient profile:  25 yobf never smoker with hypertension, degenerative arthritis, and difficulty following through on instructions regarding medication reconciliation and weight loss.     06/29/2011 f/u ov/Catherine Hoffman cc no  Cp, tia, claudication or limiting sob rec Cal balance reviewed for wt loss    12/13/2012 f/u ov/Catherine Hoffman re cough Chief Complaint  Patient presents with  . Follow-up    Pt states that her cough has improved since last visit with Dr. Shelle Iron. Cough is now non prod, but still coughs so hard she feels sore in her chest.   tramadol > can't take it, dry cough wakes her up each am, very uncertain with meds despite calendar. No sob unless coughing. Hasn't tried zyrtec. Delsym not effective  Sleeping ok without nocturnal  or early am exacerbation  of respiratory  c/o's or need for noct saba. Also denies any obvious fluctuation of symptoms with weather or environmental changes or other aggravating or alleviating factors except as outlined above   ROS  The following are not active complaints unless bolded sore throat, dysphagia, dental problems, itching, sneezing,  nasal congestion or excess/ purulent secretions, ear ache,   fever, chills, sweats, unintended wt loss, pleuritic or exertional cp, hemoptysis,  orthopnea pnd or leg swelling, presyncope, palpitations, heartburn, abdominal pain, anorexia, nausea, vomiting, diarrhea  or change in bowel or urinary habits, change in stools or urine, dysuria,hematuria,  rash, arthralgias, visual complaints, headache, numbness weakness or ataxia or problems with walking or coordination,  change in mood/affect or memory.       Past Medical History:  GERD(ICD-530.81)..............................................................................Marland KitchenRussella Hoffman  - EGD 04/28/2001 esophagitis with HH  Chronic constipation  Internal Hemorrhoids  - Colonoscopy 5/ 2006  -  Colonoscopy ok except hem 11/20/08  MORBID OBESITY (ICD-278.01)  - Target wt = 158 for BMI < 30  EPISODIC TENSION TYPE HEADACHE (ICD-339.11)  Hypertension  Degenerative arthritis  - Arthroscopy Left knee, Dr Catherine Hoffman  Radicular back/ L leg pain 08/13/2011 > [facility] > Catherine Hoffman rec mri ? done Herpes zoster-1998 with no significant residual R T 12  Hyperlipidemia  - Targe LDL < 160 (HBP only known RF)  Health Maintenance................................................................................Marland KitchenWert   - Pneumovax February 25, 2009  - Td February 25, 2009  - CPX 09/25/2010  - GYN Catherine Hoffman -med calendar 12/21/2011   Family History:  positive for hypertension and diabetes in her mother  cancer of the leg in her mother does not know the type  She has no full siblings does not know her father's history  no gallbladder disease to her knowledge  No FH of Colon Cancer:   Social History:  she denies ever smoking  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                Objective:   Physical Exam     wt 207 May 21, 2008 > 188 05/30/2012  > 182 12/13/2012  GENERAL: anxious very hoarse wf with constant throat clearing. Some pseudowheeze HEENT: [place]/AT,  , EACs-clear, TMs-wnl, NOSE-mod nonspecific Turbinate edema, THROAT-clear & wnl.  NECK: Supple w/ fair ROM; no JVD; normal carotid impulses w/o bruits; no thyromegaly or nodules palpated; no lymphadenopathy.  CHEST: Clear to P & A; w/o, wheezes/ rales/  or rhonchi.  HEART: RRR, no m/r/g heard. pedal pulses sym reduced bilaterally  ABDOMEN: Soft & nt; nml bowel sounds; no organomegaly or masses detected.  EXT: Warm bilat, no calf pain, edema, clubbing, pulses intact MS nl gait,  No deformities  Skin No rash.      CXR  12/13/2012 : No acute cardiopulmonary abnormality seen.      Assessment & Plan:

## 2012-12-14 ENCOUNTER — Encounter: Payer: Self-pay | Admitting: Internal Medicine

## 2012-12-14 LAB — ALLERGY PROFILE REGION II-DC, DE, MD, ~~LOC~~, VA
Allergen, D pternoyssinus,d7: 35.6 kU/L — ABNORMAL HIGH
Alternaria Alternata: <0.1 kU/L
Box Elder IgE: 34.5 kU/L — ABNORMAL HIGH
Cat Dander: 0.61 kU/L — ABNORMAL HIGH
Cockroach: 1.66 kU/L — ABNORMAL HIGH
D. farinae: 52.3 kU/L — ABNORMAL HIGH
Dog Dander: 9.86 kU/L — ABNORMAL HIGH
Oak: 16.7 kU/L — ABNORMAL HIGH
Pecan/Hickory Tree IgE: 37.1 kU/L — ABNORMAL HIGH

## 2012-12-15 NOTE — Progress Notes (Signed)
Quick Note:  Spoke with pt and notified of results per Dr. Wert. Pt verbalized understanding and denied any questions.  ______ 

## 2012-12-20 DIAGNOSIS — M25519 Pain in unspecified shoulder: Secondary | ICD-10-CM | POA: Diagnosis not present

## 2012-12-22 DIAGNOSIS — M25519 Pain in unspecified shoulder: Secondary | ICD-10-CM | POA: Diagnosis not present

## 2012-12-27 ENCOUNTER — Other Ambulatory Visit: Payer: Self-pay

## 2012-12-27 DIAGNOSIS — M25519 Pain in unspecified shoulder: Secondary | ICD-10-CM | POA: Diagnosis not present

## 2012-12-27 DIAGNOSIS — Z1231 Encounter for screening mammogram for malignant neoplasm of breast: Secondary | ICD-10-CM

## 2012-12-28 ENCOUNTER — Ambulatory Visit (INDEPENDENT_AMBULATORY_CARE_PROVIDER_SITE_OTHER): Payer: BC Managed Care – PPO | Admitting: Adult Health

## 2012-12-28 ENCOUNTER — Encounter: Payer: Self-pay | Admitting: Adult Health

## 2012-12-28 VITALS — BP 116/64 | HR 66 | Temp 97.6°F | Ht 62.0 in | Wt 181.8 lb

## 2012-12-28 DIAGNOSIS — J310 Chronic rhinitis: Secondary | ICD-10-CM | POA: Diagnosis not present

## 2012-12-28 NOTE — Patient Instructions (Addendum)
Follow med calendar closely and bring to each visit.  Follow up with with Allergist as planned  Follow up Dr. [name]  In 3 months and As needed

## 2012-12-28 NOTE — Progress Notes (Signed)
Subjective:    Patient ID: Catherine Hoffman, female    DOB: [birth year] .   MRN: [id number]  Brief patient profile:  86 yobf never smoker with hypertension, degenerative arthritis, and difficulty following through on instructions regarding medication reconciliation and weight loss.     06/29/2011 f/u ov/Wert cc no  Cp, tia, claudication or limiting sob rec Cal balance reviewed for wt loss    12/13/2012 f/u ov/Wert re cough Chief Complaint  Patient presents with  . Follow-up    Pt states that her cough has improved since last visit with Dr. Shelle Iron. Cough is now non prod, but still coughs so hard she feels sore in her chest.   tramadol > can't take it, dry cough wakes her up each am, very uncertain with meds despite calendar. No sob unless coughing. Hasn't tried zyrtec. Delsym not effective >>pred taper, allergist referral    12/28/2012 Follow up and Med review  Patient returns for a two-week followup and medication review. We reviewed all her medications and organized them into a medication calendar. Appears that she is taking her medications correctly. Patient was seen 2 weeks ago for persistent cough. Patient was tried on a prednisone taper, along with cough, prevention regimen with Chlor-Trimeton and reflux, preventive measures. She says that her cough is about the same, maybe slightly improved. Patient had labs done within allergy profile. That was extremely positive with a high IgE. She was referred to an allergist with an upcoming appointment in a couple weeks. Patient denies any hemoptysis, orthopnea, PND, or leg swelling  ROS  The following are not active complaints unless bolded sore throat, dysphagia, dental problems, itching, , ear ache,   fever, chills, sweats, unintended wt loss, pleuritic or exertional cp, hemoptysis,  orthopnea pnd or leg swelling, presyncope, palpitations, heartburn, abdominal pain, anorexia, nausea, vomiting, diarrhea  or change in bowel or urinary habits,  change in stools or urine, dysuria,hematuria,  rash, arthralgias, visual complaints, headache, numbness weakness or ataxia or problems with walking or coordination,  change in mood/affect or memory.       Past Medical History:  GERD(ICD-530.81)..............................................................................Marland KitchenRussella Dar  - EGD 04/28/2001 esophagitis with HH  Chronic constipation  Internal Hemorrhoids  - Colonoscopy 5/ 2006  - Colonoscopy ok except hem 11/20/08  MORBID OBESITY (ICD-278.01)  - Target wt = 158 for BMI < 30  EPISODIC TENSION TYPE HEADACHE (ICD-339.11)  Hypertension  Degenerative arthritis  - Arthroscopy Left knee, Dr Barry Dienes  Radicular back/ L leg pain 08/13/2011 > [facility] > Gioffre rec mri ? done Herpes zoster-1998 with no significant residual R T 12  Hyperlipidemia  - Targe LDL < 160 (HBP only known RF)  Health Maintenance................................................................................Marland KitchenWert   - Pneumovax February 25, 2009  - Td February 25, 2009  - CPX 09/25/2010  - GYN Senaida Ores -med calendar 12/21/2011 ., 12/29/2012   Family History:  positive for hypertension and diabetes in her mother  cancer of the leg in her mother does not know the type  She has no full siblings does not know her father's history  no gallbladder disease to her knowledge  No FH of Colon Cancer:   Social History:  she denies ever smoking  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                Objective:   Physical  Exam     wt 207 May 21, 2008 > 188 05/30/2012  > 182 12/13/2012  GENERAL:  wf with constant throat clearing.  HEENT: [place]/AT,  , EACs-clear, TMs-wnl, NOSE-mod nonspecific Turbinate edema, THROAT-clear & wnl.  NECK: Supple w/ fair ROM; no JVD; normal carotid impulses w/o bruits; no thyromegaly or nodules  palpated; no lymphadenopathy.  CHEST: Clear to P & A; w/o, wheezes/ rales/ or rhonchi.  HEART: RRR, no m/r/g heard. pedal pulses sym reduced bilaterally  ABDOMEN: Soft & nt; nml bowel sounds; no organomegaly or masses detected.  EXT: Warm bilat, no calf pain, edema, clubbing, pulses intact MS nl gait,  No deformities  Skin No rash.      CXR  12/13/2012 : No acute cardiopulmonary abnormality seen.     Assessment & Plan:

## 2012-12-29 ENCOUNTER — Encounter: Payer: Self-pay | Admitting: Adult Health

## 2012-12-29 DIAGNOSIS — M25519 Pain in unspecified shoulder: Secondary | ICD-10-CM | POA: Diagnosis not present

## 2012-12-29 NOTE — Assessment & Plan Note (Signed)
Chronic rhinitis with persistent cough. Patient will followup with allergy as planned in a couple weeks. She will continue on her current regimen. Patient's medications were reviewed today and patient education was given. Computerized medication calendar was adjusted/completed

## 2013-01-02 DIAGNOSIS — M25519 Pain in unspecified shoulder: Secondary | ICD-10-CM | POA: Diagnosis not present

## 2013-01-03 NOTE — Addendum Note (Signed)
Addended by: Boone Master E on: 01/03/2013 09:58 AM   Modules accepted: Orders

## 2013-01-07 ENCOUNTER — Other Ambulatory Visit: Payer: Self-pay | Admitting: Adult Health

## 2013-01-07 ENCOUNTER — Other Ambulatory Visit: Payer: Self-pay | Admitting: Internal Medicine

## 2013-01-09 ENCOUNTER — Ambulatory Visit
Admission: RE | Admit: 2013-01-09 | Discharge: 2013-01-09 | Disposition: A | Discharge status: Home / Self Care | Payer: BC Managed Care – PPO | Source: Ambulatory Visit

## 2013-01-09 DIAGNOSIS — Z4889 Encounter for other specified surgical aftercare: Secondary | ICD-10-CM | POA: Diagnosis not present

## 2013-01-09 DIAGNOSIS — Z1231 Encounter for screening mammogram for malignant neoplasm of breast: Secondary | ICD-10-CM

## 2013-01-10 ENCOUNTER — Ambulatory Visit: Payer: BC Managed Care – PPO

## 2013-02-07 ENCOUNTER — Encounter: Payer: Self-pay | Admitting: Internal Medicine

## 2013-02-07 ENCOUNTER — Ambulatory Visit (INDEPENDENT_AMBULATORY_CARE_PROVIDER_SITE_OTHER): Payer: BC Managed Care – PPO | Admitting: Internal Medicine

## 2013-02-07 VITALS — BP 130/76 | HR 84 | Ht 62.0 in | Wt 183.2 lb

## 2013-02-07 DIAGNOSIS — K219 Gastro-esophageal reflux disease without esophagitis: Secondary | ICD-10-CM

## 2013-02-07 DIAGNOSIS — J310 Chronic rhinitis: Secondary | ICD-10-CM | POA: Diagnosis not present

## 2013-02-07 DIAGNOSIS — Z23 Encounter for immunization: Secondary | ICD-10-CM | POA: Diagnosis not present

## 2013-02-07 MED ORDER — AZELASTINE-FLUTICASONE 137-50 MCG/ACT NA SUSP: 1.0000 | Freq: Every evening | NASAL | Status: DC

## 2013-02-07 MED ORDER — MONTELUKAST SODIUM 10 MG PO TABS: 10.0000 mg | ORAL_TABLET | Freq: Every day | ORAL | Status: DC

## 2013-02-07 NOTE — Patient Instructions (Addendum)
Script sent to try Singulair/ montelukast- one daily for allergic nose and cough  Sample Dymista nasal spray-  1-2 puffs each nostril once every day at bedtime. Try this instead of flonase/ fluticasone. When the sample runs out, go back to flonase  Try otc allergy eyedrop Alaway for allergy in eyes  Schedule allergy skin tests

## 2013-02-07 NOTE — Progress Notes (Signed)
02/07/13- 72 yoF never smoker followed by Dr [name] for cough attributed to Upper airway cough/ postnasal drip. Referred by MW; allergy labs done on 12-13-12- for allergy evaluation. Allergy Profile 12/13/12- Total IgE 806.1, broad elevations to environmental allergens. Prod cough. Allergy vaccine 10 years ago elsewhere, stopped because of local reactions. Vaccine did not help mild wheeze, rhinitis, conjunctivitis this year similar eye and nose symptoms. Occasional mild wheeze no benefit with Flonase, Zyrtec, Pepcid, Chlor-Trimeton. Main complaint is cough. Lips itching. No phlegm. Little sneeze and some wheeze. Was better in [place]. Worse in [place]. Denies history of asthma. On Pepcid plus omeprazole and not aware of reflux. Admits postnasal drip. Dry itching skin without rash. Bananas cause nausea, peaches and grapes make her lips itch. Allergy Profile 12/13/2012-total IgE 806.1 with significant elevations for common environmental allergens. Environmental-house with no basement, central air, carpet, no pets or smokers  Prior to Admission medications   Medication Sig Start Date End Date Taking? Authorizing Provider  acetaminophen (TYLENOL) 500 MG tablet Per bottle as needed for pain   Yes Historical Provider, MD  aspirin 81 MG tablet Take 81 mg by mouth daily.     Yes Historical Provider, MD  calcium-vitamin D (OSCAL WITH D) 500-200 MG-UNIT per tablet Take 1 tablet by mouth 2 (two) times daily.     Yes Historical Provider, MD  cetirizine (ZYRTEC) 10 MG tablet Take 10 mg by mouth at bedtime as needed.    Yes Historical Provider, MD  chlorpheniramine (CHLOR-TRIMETON) 4 MG tablet Take 4 mg by mouth at bedtime.   Yes Historical Provider, MD  docusate sodium (COLACE) 100 MG capsule 1-2 at bedtime as needed   Yes Historical Provider, MD  estradiol (ESTRACE) 0.5 MG tablet Take 0.5 mg by mouth daily.   Yes Historical Provider, MD  famotidine (PEPCID) 20 MG tablet Take 20 mg by mouth at bedtime.   Yes  Historical Provider, MD  fluticasone (FLONASE) 50 MCG/ACT nasal spray USE 2 SPRAYS IN EACH NOSTRIL DAILY 01/07/13  Yes Nyoka Cowden, MD  meclizine (ANTIVERT) 25 MG tablet Take 25 mg by mouth every 4 (four) hours as needed for dizziness.   Yes Historical Provider, MD  meloxicam (MOBIC) 7.5 MG tablet  11/17/12  Yes Tammy S Parrett, NP  methocarbamol (ROBAXIN) 500 MG tablet Take 1 tablet (500 mg total) by mouth every 6 (six) hours as needed. 08/31/12  Yes Amber Tamala Ser, PA-C  omeprazole (PRILOSEC) 40 MG capsule TAKE 1 CAPSULE 30 MINUTES BEFORE FIRST AND LAST MEAL OF THE DAY. 01/07/13  Yes Nyoka Cowden, MD  oxyCODONE-acetaminophen (PERCOCET/ROXICET) 5-325 MG per tablet Take 1 tablet by mouth every 4 (four) hours as needed. (this has tylenol in it) 12/13/12  Yes Nyoka Cowden, MD  potassium chloride SA (K-DUR,KLOR-CON) 20 MEQ tablet Take 20 mEq by mouth daily.   Yes Historical Provider, MD  promethazine (PHENERGAN) 25 MG tablet 1 every 4-6 hours as needed for nausea/vomiting   Yes Historical Provider, MD  spironolactone (ALDACTONE) 25 MG tablet TAKE ONE TABLET BY MOUTH TWICE DAILY 11/25/12  Yes Nyoka Cowden, MD  traZODone (DESYREL) 50 MG tablet 1-2 at bedtime as needed for trouble sleeping   Yes Historical Provider, MD  Azelastine-Fluticasone 137-50 MCG/ACT SUSP Place 1 spray into the nose every evening. 02/07/13   Waymon Budge, MD  montelukast (SINGULAIR) 10 MG tablet Take 1 tablet (10 mg total) by mouth at bedtime. 02/07/13   Waymon Budge, MD   Past Medical  History  Diagnosis Date  . Esophageal reflux   . Hypertension   . Episodic tension type headache   . Hyperlipidemia   . Arthritis   . Left rotator cuff tear   . Kidney stone on left side Hx-date unknown   Past Surgical History  Procedure Laterality Date  . Total abdominal hysterectomy w/ bilateral salpingoophorectomy  1990's  . Knee arthroscopy Left 2004  . Shoulder open rotator cuff repair Left 08/30/2012    Procedure: ROTATOR  CUFF REPAIR SHOULDER OPEN, POSSIBLE GRAFT AND ANCHORS;  Surgeon: Jacki Cones, MD;  Location: [facility];  Service: Orthopedics;  Laterality: Left;   Family History  Problem Relation Age of Onset  . Hypertension Mother   . Diabetes Mother   . Cancer Mother    History   Social History  . Marital Status: Widowed    Spouse Name: N/A    Number of Children: 2  . Years of Education: N/A   Occupational History  . Bus assistant    Social History Main Topics  . Smoking status: Never Smoker   . Smokeless tobacco: Never Used  . Alcohol Use: No  . Drug Use: No  . Sexual Activity: Not on file   Other Topics Concern  . Not on file   Social History Narrative  . No narrative on file   ROS-see HPI Constitutional:   No-   weight loss, night sweats, fevers, chills, fatigue, lassitude. HEENT:   No-  headaches, difficulty swallowing, tooth/dental problems, sore throat,       +sneezing,+itching, no-ear ache, nasal congestion, post nasal drip,  CV:  No-   chest pain, orthopnea, PND, swelling in lower extremities, anasarca, dizziness, palpitations Resp: No-   shortness of breath with exertion or at rest.              + productive cough,  No non-productive cough,  No- coughing up of blood.              No-   change in color of mucus.  No- wheezing.   Skin: No-   rash or lesions. GI:  No-   heartburn, indigestion, abdominal pain, nausea, vomiting, diarrhea,                 change in bowel habits, loss of appetite GU: No-   dysuria, change in color of urine, no urgency or frequency.  No- flank pain. MS:  No-   joint pain or swelling.  No- decreased range of motion.  No- back pain. Neuro-     nothing unusual Psych:  No- change in mood or affect. No depression or anxiety.  No memory loss.  OBJ- Physical Exam General- Alert, Oriented, Affect-appropriate, Distress- none acute Skin- rash-none, lesions- none, excoriation- none Lymphadenopathy- none Head- atraumatic             Eyes- Gross vision intact, PERRLA, conjunctivae and secretions clear. +periorbital edema            Ears- Hearing, canals-normal            Nose- Clear, no-Septal dev, mucus, polyps, erosion, perforation             Throat- Mallampati III , mucosa clear , drainage- none, tonsils- atrophic, + raspy voice Neck- flexible , trachea midline, no stridor , thyroid nl, carotid no bruit Chest - symmetrical excursion , unlabored           Heart/CV- RRR , no murmur , no gallop  ,  no rub, nl s1 s2                           - JVD- none , edema- none, stasis changes- none, varices- none           Lung- clear to P&A, wheeze- none, cough- none , dullness-none, rub- none           Chest wall-  Abd- tender-no, distended-no, bowel sounds-present, HSM- no Br/ Gen/ Rectal- Not done, not indicated Extrem- cyanosis- none, clubbing, none, atrophy- none, strength- nl Neuro- grossly intact to observation

## 2013-02-14 ENCOUNTER — Encounter: Payer: Self-pay | Admitting: Internal Medicine

## 2013-02-14 NOTE — Assessment & Plan Note (Signed)
Consistent with allergic rhinitis Plan-schedule skin testing, sample Dymista nasal spray, environmental precautions discussed. . Allergic conjunctivitis with Allaway eyedrops OTC, Singulair

## 2013-02-14 NOTE — Assessment & Plan Note (Signed)
Reemphasized reflux precautions, role of medications

## 2013-02-24 DIAGNOSIS — N951 Menopausal and female climacteric states: Secondary | ICD-10-CM | POA: Diagnosis not present

## 2013-02-24 DIAGNOSIS — Z01419 Encounter for gynecological examination (general) (routine) without abnormal findings: Secondary | ICD-10-CM | POA: Diagnosis not present

## 2013-03-07 DIAGNOSIS — H1045 Other chronic allergic conjunctivitis: Secondary | ICD-10-CM | POA: Diagnosis not present

## 2013-03-07 DIAGNOSIS — H2589 Other age-related cataract: Secondary | ICD-10-CM | POA: Diagnosis not present

## 2013-03-07 DIAGNOSIS — H40039 Anatomical narrow angle, unspecified eye: Secondary | ICD-10-CM | POA: Diagnosis not present

## 2013-03-07 DIAGNOSIS — H401290 Low-tension glaucoma, unspecified eye, stage unspecified: Secondary | ICD-10-CM | POA: Diagnosis not present

## 2013-03-14 ENCOUNTER — Other Ambulatory Visit: Payer: Self-pay | Admitting: Internal Medicine

## 2013-03-28 ENCOUNTER — Ambulatory Visit (INDEPENDENT_AMBULATORY_CARE_PROVIDER_SITE_OTHER): Payer: BC Managed Care – PPO | Admitting: Internal Medicine

## 2013-03-28 ENCOUNTER — Encounter: Payer: Self-pay | Admitting: Internal Medicine

## 2013-03-28 VITALS — BP 122/74 | HR 61 | Temp 97.5°F | Ht 62.0 in | Wt 187.6 lb

## 2013-03-28 DIAGNOSIS — R05 Cough: Secondary | ICD-10-CM | POA: Diagnosis not present

## 2013-03-28 DIAGNOSIS — J310 Chronic rhinitis: Secondary | ICD-10-CM | POA: Diagnosis not present

## 2013-03-28 MED ORDER — AZELASTINE-FLUTICASONE 137-50 MCG/ACT NA SUSP: 1.0000 | Freq: Two times a day (BID) | NASAL | Status: DC

## 2013-03-28 MED ORDER — GABAPENTIN 100 MG PO CAPS: 100.0000 mg | ORAL_CAPSULE | Freq: Three times a day (TID) | ORAL | Status: DC

## 2013-03-28 MED ORDER — PREDNISONE (PAK) 10 MG PO TABS: ORAL_TABLET | ORAL | Status: DC

## 2013-03-28 MED ORDER — OXYCODONE-ACETAMINOPHEN 5-325 MG PO TABS: 1.0000 | ORAL_TABLET | ORAL | Status: DC | PRN

## 2013-03-28 MED ORDER — CETIRIZINE HCL 10 MG PO TABS: ORAL_TABLET | ORAL | Status: DC

## 2013-03-28 NOTE — Assessment & Plan Note (Signed)
-   Allergy profile 12/13/12 > IgE over 800 > referred to Dr Maple Hudson   - Trial of singulair > d/c 03/28/2013 as no better   Will increase h1 at hs to see if we can at least eliminate the noct cough

## 2013-03-28 NOTE — Progress Notes (Signed)
Subjective:    Patient ID: Catherine Hoffman, female    DOB: [birth year] .   MRN: [id number]  Brief patient profile:  4 yobf never smoker with hypertension, degenerative arthritis, and difficulty following through on instructions regarding medication reconciliation and weight loss.     06/29/2011 f/u ov/Gracelee Stemmler cc no  Cp, tia, claudication or limiting sob rec Cal balance reviewed for wt loss    12/13/2012 f/u ov/Mabell Esguerra re cough onset March 2014 p ET for shoulder  Chief Complaint  Patient presents with  . Follow-up    Pt states that her cough has improved since last visit with Dr. Shelle Iron. Cough is now non prod, but still coughs so hard she feels sore in her chest.   tramadol > can't take it, dry cough wakes her up each am, very uncertain with meds despite calendar. No sob unless coughing. Hasn't tried zyrtec. Delsym not effective >>pred taper, allergist referral    12/28/2012 Follow up and Med review  Patient returns for a two-week followup and medication review. We reviewed all her medications and organized them into a medication calendar. Appears that she is taking her medications correctly. Patient was seen 2 weeks ago for persistent cough. Patient was tried on a prednisone taper, along with cough, prevention regimen with Chlor-Trimeton and reflux, preventive measures. She says that her cough is about the same, maybe slightly improved. Patient had labs done within allergy profile. That was extremely positive with a high IgE. She was referred to an allergist with an upcoming appointment in a couple weeks. rec Follow med calendar  02/07/13 allergy eval Dr Maple Hudson rec Script sent to try Singulair/ montelukast- one daily for allergic nose and cough  Sample Dymista nasal spray-  1-2 puffs each nostril once every day at bedtime. Try this instead of flonase/ fluticasone. When the sample runs out, go back to flonase  Try otc allergy eyedrop Alaway for allergy in eyes  Schedule allergy skin  tests  03/28/2013 f/u ov/Everrett Lacasse re: cough ever since ET / no better on singulair or dymista "nothing helps" Chief Complaint  Patient presents with  . Follow-up    Pt states cough has been worse for the past wk- mainly non prod and worse with talking.       No obvious day to day or daytime variabilty or assoc sob (unless coughing)  or cp or chest tightness, subjective wheeze overt sinus or hb symptoms. No unusual exp hx or h/o childhood pna/ asthma or knowledge of premature birth.    Also denies any obvious fluctuation of symptoms with weather or environmental changes or other aggravating or alleviating factors except as outlined above   Current Medications, Allergies, Complete Past Medical History, Past Surgical History, Family History, and Social History were reviewed in [employer] record.  ROS  The following are not active complaints unless bolded sore throat, dysphagia, dental problems, itching, sneezing,  nasal congestion or excess/ purulent secretions, ear ache,   fever, chills, sweats, unintended wt loss, pleuritic or exertional cp, hemoptysis,  orthopnea pnd or leg swelling, presyncope, palpitations, heartburn, abdominal pain, anorexia, nausea, vomiting, diarrhea  or change in bowel or urinary habits, change in stools or urine, dysuria,hematuria,  rash, arthralgias, visual complaints, headache, numbness weakness or ataxia or problems with walking or coordination,  change in mood/affect or memory.              Past Medical History:  GERD(ICD-530.81)..............................................................................Marland KitchenRussella Dar  - EGD 04/28/2001 esophagitis with HH  Chronic constipation  Internal Hemorrhoids  -  Colonoscopy 5/ 2006  - Colonoscopy ok except hem 11/20/08  MORBID OBESITY (ICD-278.01)  - Target wt = 158 for BMI < 30  EPISODIC TENSION TYPE HEADACHE (ICD-339.11)  Hypertension  Degenerative arthritis  - Arthroscopy Left knee, Dr Barry Dienes   Radicular back/ L leg pain 08/13/2011 > [facility] > Gioffre rec mri ? done Herpes zoster-1998 with no significant residual R T 12  Hyperlipidemia  - Targe LDL < 160 (HBP only known RF)  Health Maintenance................................................................................Marland KitchenWert   - Pneumovax February 25, 2009  - Td February 25, 2009  - CPX 09/25/2010  - GYN Senaida Ores -med calendar 12/21/2011 ,  12/29/2012   Family History:  positive for hypertension and diabetes in her mother  cancer of the leg in her mother does not know the type  She has no full siblings does not know her father's history  no gallbladder disease to her knowledge  No FH of Colon Cancer:   Social History:  she denies ever smoking  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                Objective:   Physical Exam     wt 207 May 21, 2008 > 188 05/30/2012  > 182 12/13/2012 > 03/28/2013  187  GENERAL:  Hoarse amb wf with constant throat clearing.  HEENT: [place]/AT,  , EACs-clear, TMs-wnl, NOSE-mod nonspecific Turbinate edema, THROAT-clear & wnl.  NECK: Supple w/ fair ROM; no JVD; normal carotid impulses w/o bruits; no thyromegaly or nodules palpated; no lymphadenopathy.  CHEST: Clear to P & A; w/o, wheezes/ rales/ or rhonchi.  HEART: RRR, no m/r/g heard. pedal pulses sym reduced bilaterally  ABDOMEN: Soft & nt; nml bowel sounds; no organomegaly or masses detected.  EXT: Warm bilat, no calf pain, edema, clubbing, pulses intact MS nl gait,  No deformities  Skin No rash.      CXR  12/13/2012 : No acute cardiopulmonary abnormality seen.     Assessment & Plan:

## 2013-03-28 NOTE — Patient Instructions (Addendum)
Take delsym two tsp every 12 hours and supplement if needed with oxycodone(percocet) 1 every 4 hours to suppress the urge to cough. Swallowing water or using ice chips/non mint and menthol containing candies (such as lifesavers or sugarless jolly ranchers) are also effective.  You should rest your voice and avoid activities that you know make you cough.  Once you have eliminated the cough for 3 straight days try reducing the oxycodone(percocet),  then the delsym as tolerated.    .Prednisone 10 mg take  4 each am x 2 days,   2 each am x 2 days,  1 each am x 2 days and stop   Stop flonase and oscal D   Start dymista one twice daily   Start neurontin 100 mg three times daily with meals   Increase the pm dose of chlortrimeton to 4mg  x 2 at bedtime   Please see patient coordinator before you leave today  to schedule sinus ct   Please schedule a follow up office visit in 2 weeks, sooner if needed with all medications and pill organizer in hand

## 2013-03-28 NOTE — Assessment & Plan Note (Addendum)
-   onset March 2014 p ET - Sinus ct 03/28/2013 >>>    Never have eliminated cyclical cough that started with ET trauma in march 2014 which is critical in upper airway cough syndromes like this  At this point no choice but to try again and this time add neurontin     Each maintenance medication was reviewed in detail including most importantly the difference between maintenance and as needed and under what circumstances the prns are to be used. This was done in the context of a medication calendar review which provided the patient with a user-friendly unambiguous mechanism for medication administration and reconciliation and provides an action plan for all active problems. It is critical that this be shown to every doctor  for modification during the office visit if necessary so the patient can use it as a working document.

## 2013-03-30 ENCOUNTER — Other Ambulatory Visit: Payer: BC Managed Care – PPO

## 2013-03-31 ENCOUNTER — Encounter: Payer: Self-pay | Admitting: Internal Medicine

## 2013-03-31 ENCOUNTER — Ambulatory Visit (INDEPENDENT_AMBULATORY_CARE_PROVIDER_SITE_OTHER)
Admission: RE | Admit: 2013-03-31 | Discharge: 2013-03-31 | Disposition: A | Discharge status: Home / Self Care | Payer: BC Managed Care – PPO | Source: Ambulatory Visit | Attending: Internal Medicine | Admitting: Internal Medicine

## 2013-03-31 DIAGNOSIS — R059 Cough, unspecified: Secondary | ICD-10-CM | POA: Diagnosis not present

## 2013-03-31 DIAGNOSIS — R05 Cough: Secondary | ICD-10-CM

## 2013-04-10 NOTE — Progress Notes (Signed)
Quick Note:  Called, spoke with pt. Informed her of results and recs per Dr. [name]. She verbalized understanding of this and is aware of her pending OV with MW on Nov 5 at 9 am. She is aware to bring all meds and pill organizers with her to this OV per last OV pt instructions. She verbalized understanding and voiced no further questions or concerns at this time. ______

## 2013-04-12 ENCOUNTER — Encounter: Payer: Self-pay | Admitting: Internal Medicine

## 2013-04-12 ENCOUNTER — Ambulatory Visit (INDEPENDENT_AMBULATORY_CARE_PROVIDER_SITE_OTHER): Payer: BC Managed Care – PPO | Admitting: Internal Medicine

## 2013-04-12 VITALS — BP 122/72 | HR 64 | Temp 98.2°F | Ht 62.0 in | Wt 188.0 lb

## 2013-04-12 DIAGNOSIS — R059 Cough, unspecified: Secondary | ICD-10-CM

## 2013-04-12 DIAGNOSIS — R05 Cough: Secondary | ICD-10-CM | POA: Diagnosis not present

## 2013-04-12 DIAGNOSIS — J310 Chronic rhinitis: Secondary | ICD-10-CM

## 2013-04-12 NOTE — Progress Notes (Signed)
Subjective:    Patient ID: Catherine Hoffman, female    DOB: 07-26-1940 .   MRN: [id number]  Brief patient profile:  44 yobf never smoker with hypertension, degenerative arthritis, and difficulty following through on instructions regarding medication reconciliation and weight loss.     06/29/2011 f/u ov/Naia Ruff cc no  Cp, tia, claudication or limiting sob rec Cal balance reviewed for wt loss    12/13/2012 f/u ov/Ohn Bostic re cough onset March 2014 p ET for shoulder  Chief Complaint  Patient presents with  . Follow-up    Pt states that her cough has improved since last visit with Dr. Shelle Iron. Cough is now non prod, but still coughs so hard she feels sore in her chest.   tramadol > can't take it, dry cough wakes her up each am, very uncertain with meds despite calendar. No sob unless coughing. Hasn't tried zyrtec. Delsym not effective >>pred taper, allergist referral    12/28/2012 Follow up and Med review  Patient returns for a two-week followup and medication review. We reviewed all her medications and organized them into a medication calendar. Appears that she is taking her medications correctly. Patient was seen 2 weeks ago for persistent cough. Patient was tried on a prednisone taper, along with cough, prevention regimen with Chlor-Trimeton and reflux, preventive measures. She says that her cough is about the same, maybe slightly improved. Patient had labs done within allergy profile. That was extremely positive with a high IgE. She was referred to an allergist with an upcoming appointment in a couple weeks. rec Follow med calendar  02/07/13 allergy eval Dr Maple Hudson rec Script sent to try Singulair/ montelukast- one daily for allergic nose and cough Sample Dymista nasal spray-  1-2 puffs each nostril once every day at bedtime. Try this instead of flonase/ fluticasone. When the sample runs out, go back to flonase Try otc allergy eyedrop Alaway for allergy in eyes Schedule allergy skin  tests   03/28/2013 f/u ov/Dailah Opperman re: cough ever since ET / no better on singulair or flonase "nothing helps" Chief Complaint  Patient presents with  . Follow-up    Pt states cough has been worse for the past wk- mainly non prod and worse with talking.     Take delsym two tsp every 12 hours and supplement if needed with oxycodone(percocet) 1 every 4 hours to suppress the urge to cough.    Prednisone 10 mg take  4 each am x 2 days,   2 each am x 2 days,  1 each am x 2 days and stop  Stop flonase and oscal D  Start dymista one twice daily  Start neurontin 100 mg three times daily with meals  Increase the pm dose of chlortrimeton to 4mg  x 2 at bedtime  Please see patient coordinator before you leave today  to schedule sinus ct > neg    04/12/2013 f/u ov/Krishay Faro re: cough Chief Complaint  Patient presents with  . Follow-up    Pt reports cough is about 90% better.    Confused with maint vs prns, has a wheel with all meds for the day in the same slot - no longer needing prn cough meds, cough is dyay > noct, dry, worse with voice use  No obvious day to day or daytime variabilty or assoc sob (unless coughing)  or cp or chest tightness, subjective wheeze overt sinus or hb symptoms. No unusual exp hx or h/o childhood pna/ asthma or knowledge of premature birth.    Also denies  any obvious fluctuation of symptoms with weather or environmental changes or other aggravating or alleviating factors except as outlined above   Current Medications, Allergies, Complete Past Medical History, Past Surgical History, Family History, and Social History were reviewed in [employer] record.  ROS  The following are not active complaints unless bolded sore throat, dysphagia, dental problems, itching, sneezing,  nasal congestion or excess/ purulent secretions, ear ache,   fever, chills, sweats, unintended wt loss, pleuritic or exertional cp, hemoptysis,  orthopnea pnd or leg swelling, presyncope,  palpitations, heartburn, abdominal pain, anorexia, nausea, vomiting, diarrhea  or change in bowel or urinary habits, change in stools or urine, dysuria,hematuria,  rash, arthralgias, visual complaints, headache, numbness weakness or ataxia or problems with walking or coordination,  change in mood/affect or memory.              Past Medical History:  GERD(ICD-530.81)..............................................................................Marland KitchenRussella Dar  - EGD 04/28/2001 esophagitis with HH  Chronic constipation  Internal Hemorrhoids  - Colonoscopy 5/ 2006  - Colonoscopy ok except hem 11/20/08  MORBID OBESITY (ICD-278.01)  - Target wt = 158 for BMI < 30  EPISODIC TENSION TYPE HEADACHE (ICD-339.11)  Hypertension  Degenerative arthritis  - Arthroscopy Left knee, Dr Barry Dienes  Radicular back/ L leg pain 08/13/2011 > [facility] > [facility]ffre rec mri ? done Herpes zoster-1998 with no significant residual R T 12  Hyperlipidemia  - Targe LDL < 160 (HBP only known RF)  Health Maintenance................................................................................Marland KitchenWert   - Pneumovax February 25, 2009  - Td February 25, 2009  - CPX 09/25/2010  - GYN Senaida Ores -med calendar 12/21/2011 ,  12/29/2012   Family History:  positive for hypertension and diabetes in her mother  cancer of the leg in her mother does not know the type  She has no full siblings does not know her father's history  no gallbladder disease to her knowledge  No FH of Colon Cancer:   Social History:  she denies ever smoking  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                Objective:   Physical Exam     wt 207 May 21, 2008  Wt Readings from Last 3 Encounters:  04/12/13 188 lb (85.276 kg)  03/28/13 187 lb 9.6 oz (85.095 kg)  02/07/13 183 lb 3.2 oz (83.099 kg)      GENERAL:  Hoarse amb wf with constant throat clearing has resolved.  HEENT: [place]/AT,  , EACs-clear, TMs-wnl, NOSE-mod nonspecific Turbinate edema, THROAT-clear & wnl.  NECK: Supple w/ fair ROM; no JVD; normal carotid impulses w/o bruits; no thyromegaly or nodules palpated; no lymphadenopathy.  CHEST: Clear to P & A; w/o, wheezes/ rales/ or rhonchi.  HEART: RRR, no m/r/g heard. pedal pulses sym reduced bilaterally  ABDOMEN: Soft & nt; nml bowel sounds; no organomegaly or masses detected.  EXT: Warm bilat, no calf pain, edema, clubbing, pulses intact MS nl gait,  No deformities  Skin No rash.      CXR  12/13/2012 : No acute cardiopulmonary abnormality seen.     Assessment & Plan:

## 2013-04-12 NOTE — Patient Instructions (Addendum)
No change in your medications for now  If can't afford the dysmista ok to replace with the flonase as before   See Tammy NP in 6  weeks with all your medications, even over the counter meds, separated in two separate bags, the ones you take no matter(the ones you put in slots in your pill box - bring it too -  what vs the ones you stop once you feel better and take only as needed when you feel you need them.   Tammy  will generate for you a new user friendly medication calendar that will put [place] all on the same page re: your medication use.     Without this process, it simply isn't possible to assure that we are providing  your outpatient care  with  the attention to detail we feel you deserve.   If we cannot assure that you're getting that kind of care,  then we cannot manage your problem effectively from this clinic.  Once you have seen Tammy and we are sure that we're all on the same page with your medication use she will arrange follow up with me.

## 2013-04-14 NOTE — Assessment & Plan Note (Addendum)
-   Allergy profile 12/13/12 > IgE over 800 > referred to Dr Maple Hudson  > plan for allergy testing Jan 2014  - Trial of singulair > d/c 03/28/2013 as no better   Though her allergies may be the "spark" for the cough I strongly doubt this is the "fuel" for the upper airway problem which is more likely cyclical coughing started by ET trauma in March 2014 and is better at this point but support going ahead with allergy testing and treatment as per Dr Roxy Cedar directions

## 2013-04-14 NOTE — Assessment & Plan Note (Addendum)
-   onset March 2014 p ET - Sinus ct  03/31/2013  > No evidence of active sinus disease. Little change seen from the prior study. - Neurontin 03/28/2013 > improved 04/12/13   Response to neurontin strongly supports dx of  Classic Upper airway cough syndrome, so named because it's frequently impossible to sort out how much is  CR/sinusitis with freq throat clearing (which can be related to primary GERD)   vs  causing  secondary (" extra esophageal")  GERD from wide swings in gastric pressure that occur with throat clearing, often  promoting self use of mint and menthol lozenges that reduce the lower esophageal sphincter tone and exacerbate the problem further in a cyclical fashion.   For now will continue neurontin  These are the same pts (now being labeled as having "irritable larynx syndrome" by some cough centers) who not infrequently have a history of having failed to tolerate ace inhibitors,  dry powder inhalers or biphosphonates or report having atypical reflux symptoms that don't respond to standard doses of PPI , and are easily confused as having aecopd or asthma flares by even experienced allergists/ pulmonologists.   I had an extended discussion with the patient today lasting 15 to 20 minutes of a 25 minute visit on the following issues:  Desperately needs more effective medication reconciliation.  To keep things simple, I have asked the patient to first separate medicines that are perceived as maintenance, that is to be taken daily "no matter what", from those medicines that are taken on only on an as-needed basis and I have given the patient examples of both, and then return to see our NP to generate a  detailed  medication calendar which should be followed until the next physician sees the patient and updates it.

## 2013-05-24 ENCOUNTER — Encounter: Payer: Self-pay | Admitting: Adult Health

## 2013-05-24 ENCOUNTER — Ambulatory Visit (INDEPENDENT_AMBULATORY_CARE_PROVIDER_SITE_OTHER): Payer: BC Managed Care – PPO | Admitting: Adult Health

## 2013-05-24 VITALS — BP 116/84 | HR 62 | Temp 97.2°F | Ht 62.5 in | Wt 189.5 lb

## 2013-05-24 DIAGNOSIS — R05 Cough: Secondary | ICD-10-CM

## 2013-05-24 NOTE — Patient Instructions (Signed)
Follow med calendar closely and bring to each visit.  Follow up with Dr. Maple Hudson  As planned next month  Follow up Dr. [name]  In 3 months for physical .  Please contact office for sooner follow up if symptoms do not improve or worsen or seek emergency care

## 2013-05-24 NOTE — Assessment & Plan Note (Addendum)
Recurrent upper airway cough with triggers of AR /GERD  Patient's medications were reviewed today and patient education was given. Computerized medication calendar was adjusted/completed   Plan  Follow med calendar closely and bring to each visit.  Follow up with Dr. Maple Hudson  As planned next month  Follow up Dr. [name]  In 3 months for physical .  Please contact office for sooner follow up if symptoms do not improve or worsen or seek emergency care

## 2013-05-24 NOTE — Progress Notes (Signed)
Subjective:    Patient ID: Catherine Hoffman, female    DOB: [birth year] .   MRN: [id number]  Brief patient profile:  68 yobf never smoker with hypertension, degenerative arthritis, and difficulty following through on instructions regarding medication reconciliation and weight loss.    06/29/2011 f/u ov/Wert cc no  Cp, tia, claudication or limiting sob rec Cal balance reviewed for wt loss    12/13/2012 f/u ov/Wert re cough onset March 2014 p ET for shoulder  Chief Complaint  Patient presents with  . Follow-up    Pt states that her cough has improved since last visit with Dr. Shelle Iron. Cough is now non prod, but still coughs so hard she feels sore in her chest.   tramadol > can't take it, dry cough wakes her up each am, very uncertain with meds despite calendar. No sob unless coughing. Hasn't tried zyrtec. Delsym not effective >>pred taper, allergist referral    12/28/2012 Follow up and Med review  Patient returns for a two-week followup and medication review. We reviewed all her medications and organized them into a medication calendar. Appears that she is taking her medications correctly. Patient was seen 2 weeks ago for persistent cough. Patient was tried on a prednisone taper, along with cough, prevention regimen with Chlor-Trimeton and reflux, preventive measures. She says that her cough is about the same, maybe slightly improved. Patient had labs done within allergy profile. That was extremely positive with a high IgE. She was referred to an allergist with an upcoming appointment in a couple weeks. rec Follow med calendar  02/07/13 allergy eval Dr Maple Hudson rec Script sent to try Singulair/ montelukast- one daily for allergic nose and cough Sample Dymista nasal spray-  1-2 puffs each nostril once every day at bedtime. Try this instead of flonase/ fluticasone. When the sample runs out, go back to flonase Try otc allergy eyedrop Alaway for allergy in eyes Schedule allergy skin  tests   03/28/2013 f/u ov/Wert re: cough ever since ET / no better on singulair or flonase "nothing helps" Chief Complaint  Patient presents with  . Follow-up    Pt states cough has been worse for the past wk- mainly non prod and worse with talking.     Take delsym two tsp every 12 hours and supplement if needed with oxycodone(percocet) 1 every 4 hours to suppress the urge to cough.    Prednisone 10 mg take  4 each am x 2 days,   2 each am x 2 days,  1 each am x 2 days and stop  Stop flonase and oscal D  Start dymista one twice daily  Start neurontin 100 mg three times daily with meals  Increase the pm dose of chlortrimeton to 4mg  x 2 at bedtime  Please see patient coordinator before you leave today  to schedule sinus ct > neg    04/12/2013 f/u ov/Wert re: cough Chief Complaint  Patient presents with  . Follow-up    Pt reports cough is about 90% better.    Confused with maint vs prns, has a wheel with all meds for the day in the same slot - no longer needing prn cough meds, cough is dyay > noct, dry, worse with voice use >>Dymista in place of flonase   05/24/2013 Follow up and Med review  Patient returns for a 6 week followup. Patient reports that she is doing okay. Does report that her cough started come back. A few weeks ago. Feels that it is allergy related with  drainage in the back of her throat. She denies any fever or purulent sputum, chest pain, orthopnea, PND, or leg swelling. We reviewed all her medications updated. Her medication calendar with patient education. Patient has not started Zyrtec for Delsym for her return to cough. We talked about preventative regimen to help with her cough suppression     Current Medications, Allergies, Complete Past Medical History, Past Surgical History, Family History, and Social History were reviewed in [employer] record.  ROS  The following are not active complaints unless bolded sore throat, dysphagia, dental  problems, itching, sneezing,  nasal congestion or excess/ purulent secretions, ear ache,   fever, chills, sweats, unintended wt loss, pleuritic or exertional cp, hemoptysis,  orthopnea pnd or leg swelling, presyncope, palpitations, heartburn, abdominal pain, anorexia, nausea, vomiting, diarrhea  or change in bowel or urinary habits, change in stools or urine, dysuria,hematuria,  rash, arthralgias, visual complaints, headache, numbness weakness or ataxia or problems with walking or coordination,  change in mood/affect or memory.              Past Medical History:  GERD(ICD-530.81)..............................................................................Marland KitchenRussella Dar  - EGD 04/28/2001 esophagitis with HH  Chronic constipation  Internal Hemorrhoids  - Colonoscopy 5/ 2006  - Colonoscopy ok except hem 11/20/08  MORBID OBESITY (ICD-278.01)  - Target wt = 158 for BMI < 30  EPISODIC TENSION TYPE HEADACHE (ICD-339.11)  Hypertension  Degenerative arthritis  - Arthroscopy Left knee, Dr Barry Dienes  Radicular back/ L leg pain 08/13/2011 > [facility] > [facility]re rec mri ? done Herpes zoster-1998 with no significant residual R T 12  Hyperlipidemia  - Targe LDL < 160 (HBP only known RF)  Health Maintenance................................................................................Marland KitchenWert   - Pneumovax February 25, 2009  - Td February 25, 2009  - CPX 09/25/2010  - GYN Senaida Ores -med calendar 12/21/2011 ,  12/29/2012 , 05/24/2013   Family History:  positive for hypertension and diabetes in her mother  cancer of the leg in her mother does not know the type  She has no full siblings does not know her father's history  no gallbladder disease to her knowledge  No FH of Colon Cancer:   Social History:  she denies ever smoking  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                 Objective:   Physical Exam     GENERAL:  Hoarse amb wf    HEENT: [place]/AT,  , EACs-clear, TMs-wnl, NOSE-mod nonspecific Turbinate edema, THROAT-clear & wnl.  NECK: Supple w/ fair ROM; no JVD; normal carotid impulses w/o bruits; no thyromegaly or nodules palpated; no lymphadenopathy.  CHEST: Clear to P & A; w/o, wheezes/ rales/ or rhonchi.  HEART: RRR, no m/r/g heard. pedal pulses sym reduced bilaterally  ABDOMEN: Soft & nt; nml bowel sounds; no organomegaly or masses detected.  EXT: Warm bilat, no calf pain, edema, clubbing, pulses intact MS nl gait,  No deformities  Skin No rash.      CXR  12/13/2012 : No acute cardiopulmonary abnormality seen.     Assessment & Plan:

## 2013-06-14 ENCOUNTER — Ambulatory Visit (INDEPENDENT_AMBULATORY_CARE_PROVIDER_SITE_OTHER): Payer: BC Managed Care – PPO | Admitting: Internal Medicine

## 2013-06-14 ENCOUNTER — Encounter: Payer: Self-pay | Admitting: Internal Medicine

## 2013-06-14 VITALS — BP 130/80 | HR 65 | Ht 62.0 in | Wt 188.8 lb

## 2013-06-14 DIAGNOSIS — J310 Chronic rhinitis: Secondary | ICD-10-CM

## 2013-06-14 DIAGNOSIS — R059 Cough, unspecified: Secondary | ICD-10-CM

## 2013-06-14 DIAGNOSIS — R05 Cough: Secondary | ICD-10-CM

## 2013-06-14 DIAGNOSIS — T7840XA Allergy, unspecified, initial encounter: Secondary | ICD-10-CM | POA: Diagnosis not present

## 2013-06-14 MED ORDER — METHYLPREDNISOLONE ACETATE 80 MG/ML IJ SUSP
80.0000 mg | Freq: Once | INTRAMUSCULAR | Status: AC
  Administered 2013-06-14: 80 mg via INTRAMUSCULAR

## 2013-06-14 MED ORDER — LEVALBUTEROL HCL 0.63 MG/3ML IN NEBU
0.6300 mg | INHALATION_SOLUTION | Freq: Once | RESPIRATORY_TRACT | Status: AC
  Administered 2013-06-14: 0.63 mg via RESPIRATORY_TRACT

## 2013-06-14 NOTE — Progress Notes (Signed)
02/07/13- 72 yoF never smoker followed by Dr Sherene SiresWert for cough attributed to Upper airway cough/ postnasal drip. Referred by MW; allergy labs done on 12-13-12- for allergy evaluation. Allergy Profile 12/13/12- Total IgE 806.1, broad elevations to environmental allergens. Prod cough. Allergy vaccine 10 years ago elsewhere, stopped because of local reactions. Vaccine did not help mild wheeze, rhinitis, conjunctivitis.  This year similar eye and nose symptoms. Occasional mild wheeze no benefit with Flonase, Zyrtec, Pepcid, Chlor-Trimeton. Main complaint is cough. Lips itching. No phlegm. Little sneeze and some wheeze. [place]. Denies history of asthma. On Pepcid plus omeprazole and not aware of reflux. Admits postnasal drip. Dry itching skin without rash. Bananas cause nausea, peaches and grapes make her lips itch. Allergy Profile 12/13/2012-total IgE 806.1 with significant elevations for common environmental allergens. Environmental-house with no basement, central air, carpet, no pets or smokers  06/14/13- 72 yoF never smoker followed by Dr Sherene SiresWert for cough attributed to Upper airway cough/ postnasal drip. Allergic rhinitis, Food allergy Comes today for allergy testing. Still some cough and nasal congestion but no wheeze. No problems with foods as long as she avoids bananas, peaches and grapes. Points out some dermatitis on her ankles. Allergy Skin test 06/14/13- Strong positive puncture reactions, no I.D tests. Given allegra.  ROS-see HPI Constitutional:   No-   weight loss, night sweats, fevers, chills, fatigue, lassitude. HEENT:   No-  headaches, difficulty swallowing, tooth/dental problems, sore throat,       +sneezing,+itching, no-ear ache, nasal congestion, post nasal drip,  CV:  No-   chest pain, orthopnea, PND, swelling in lower extremities, anasarca, dizziness, palpitations Resp: No-   shortness of breath with exertion or at rest.              + productive cough,  No  non-productive cough,  No- coughing up of blood.              No-   change in color of mucus.  No- wheezing.   Skin: No-   rash or lesions. GI:  No-   heartburn, indigestion, abdominal pain, nausea, vomiting, GU:  MS:  No-   joint pain or swelling.   Neuro-     nothing unusual Psych:  No- change in mood or affect. No depression or anxiety.  No memory loss.  OBJ- Physical Exam General- Alert, Oriented, Affect-appropriate, Distress- none acute Skin- rash-none, lesions- none, excoriation- none Lymphadenopathy- none Head- atraumatic            Eyes- Gross vision intact, PERRLA, conjunctivae and secretions clear. +periorbital edema            Ears- Hearing, canals-normal            Nose- Clear, no-Septal dev, mucus, polyps, erosion, perforation             Throat- Mallampati III , mucosa clear , drainage- none, tonsils- atrophic, + raspy voice Neck- flexible , trachea midline, no stridor , thyroid nl, carotid no bruit Chest - symmetrical excursion , unlabored           Heart/CV- RRR , no murmur , no gallop  , no rub, nl s1 s2                           - JVD- none , edema- none, stasis changes- none, varices- none           Lung- clear to P&A,  wheeze- none, cough+ dry , dullness-none, rub- none           Chest wall-  Abd-  Br/ Gen/ Rectal- Not done, not indicated Extrem- cyanosis- none, clubbing, none, atrophy- none, strength- nl Neuro- grossly intact to observation

## 2013-06-14 NOTE — Patient Instructions (Signed)
Neb xop o.63  Depo 80  We will get you back in a month and talk more then about allergy shots as an option  Ok to take an antihistamine like claritin or allegra as needed for cough, itch, sneeze

## 2013-07-07 NOTE — Assessment & Plan Note (Signed)
Very strong allergy skin test reactions to grass weed and tree pollens, dust mite, cat and dog. We discussed environmental allergens and how to control exposure. Plan-today given nebulizer treatment with Xopenex, Depo-Medrol and Allegra

## 2013-07-07 NOTE — Assessment & Plan Note (Signed)
Plan-nebulizer treatment with Xopenex, Depo-Medrol. Watch association with spring pollen season and rhinitis.

## 2013-07-12 ENCOUNTER — Encounter: Payer: Self-pay | Admitting: Internal Medicine

## 2013-07-20 ENCOUNTER — Encounter: Payer: Self-pay | Admitting: Internal Medicine

## 2013-07-20 ENCOUNTER — Ambulatory Visit (INDEPENDENT_AMBULATORY_CARE_PROVIDER_SITE_OTHER): Payer: BC Managed Care – PPO | Admitting: Internal Medicine

## 2013-07-20 VITALS — BP 132/84 | HR 60 | Ht 62.0 in | Wt 188.0 lb

## 2013-07-20 DIAGNOSIS — J309 Allergic rhinitis, unspecified: Secondary | ICD-10-CM | POA: Diagnosis not present

## 2013-07-20 NOTE — Patient Instructions (Addendum)
We will make an allergy vaccine for you and the allergy lab will call you when it is ready for you to come in and start your shots.  For now, try using the Dymista nasal spray- 1 puff each nostril every night at bedtime  You can use and antihistamine like claritin or allegra or zyrtec, as needed  Cancel the March 4 appointment and instead make one to see me in 2 months

## 2013-07-20 NOTE — Assessment & Plan Note (Signed)
She saw partial benefit from Dymista and oral antihistamine She wants to try again with allergy vaccine. Realistic expectation, risk, alternatives including Grasstek were considered.

## 2013-07-20 NOTE — Progress Notes (Signed)
02/07/13- 72 yoF never smoker followed by Dr [name] for cough attributed to Upper airway cough/ postnasal drip. Referred by MW; allergy labs done on 12-13-12- for allergy evaluation. Allergy Profile 12/13/12- Total IgE 806.1, broad elevations to environmental allergens. Prod cough. Allergy vaccine 10 years ago elsewhere, stopped because of local reactions. Vaccine did not help mild wheeze, rhinitis, conjunctivitis.  This year similar eye and nose symptoms. Occasional mild wheeze no benefit with Flonase, Zyrtec, Pepcid, Chlor-Trimeton. Main complaint is cough. Lips itching. No phlegm. Little sneeze and some wheeze. Was better in [place]. Worse in [place]. Denies history of asthma. On Pepcid plus omeprazole and not aware of reflux. Admits postnasal drip. Dry itching skin without rash. Bananas cause nausea, peaches and grapes make her lips itch. Allergy Profile 12/13/2012-total IgE 806.1 with significant elevations for common environmental allergens. Environmental-house with no basement, central air, carpet, no pets or smokers  06/14/13- 72 yoF never smoker followed by Dr [name] for cough attributed to Upper airway cough/ postnasal drip. Allergic rhinitis, Food allergy Comes today for allergy testing. Still some cough and nasal congestion but no wheeze. No problems with foods as long as she avoids bananas, peaches and grapes. Points out some dermatitis on her ankles. Allergy Skin test 06/14/13- Strong positive puncture reactions, no I.D tests. Given allegra.  07/20/12-  72 yoF never smoker followed for Allergic rhinitis, Food allergy. Followed  by Dr [name] for cough attributed to Upper airway cough/ postnasal drip. Complicated by GERD, obesity FOLLOWS FOR:  Pt reports cough is better -- still having some sinus pressure and itchy ears with PND She had hx of strong local reactions to allergy shots at another office many years ago. She wanted to discuss another try. We reviewed her symptom pattern and skin test  results.  Dymista some help. Sill some post nasal drip.   ROS-see HPI Constitutional:   No-   weight loss, night sweats, fevers, chills, fatigue, lassitude. HEENT:   No-  headaches, difficulty swallowing, tooth/dental problems, sore throat,       +sneezing,+itching, no-ear ache, +nasal congestion, +post nasal drip,  CV:  No-   chest pain, orthopnea, PND, swelling in lower extremities, anasarca, dizziness, palpitations Resp: No-   shortness of breath with exertion or at rest.              + productive cough,  No non-productive cough,  No- coughing up of blood.              No-   change in color of mucus.  No- wheezing.   Skin: No-   rash or lesions. GI:  No-   heartburn, indigestion, abdominal pain, nausea, vomiting, GU:  MS:  No-   joint pain or swelling.   Neuro-     nothing unusual Psych:  No- change in mood or affect. No depression or anxiety.  No memory loss.  OBJ- Physical Exam General- Alert, Oriented, Affect-appropriate, Distress- none acute Skin- rash-none, lesions- none, excoriation- none Lymphadenopathy- none Head- atraumatic            Eyes- Gross vision intact, PERRLA, conjunctivae and secretions clear. +periorbital edema            Ears- Hearing, canals-normal            Nose- +very pale mucosa no-Septal dev, mucus, polyps, erosion, perforation             Throat- Mallampati III , mucosa clear , drainage- none, tonsils- atrophic,  Neck- flexible , trachea  midline, no stridor , thyroid nl, carotid no bruit Chest - symmetrical excursion , unlabored           Heart/CV- RRR , no murmur , no gallop  , no rub, nl s1 s2                           - JVD- none , edema- none, stasis changes- none, varices- none           Lung- clear to P&A, wheeze- none, cough-none , dullness-none, rub- none           Chest wall-  Abd-  Br/ Gen/ Rectal- Not done, not indicated Extrem- cyanosis- none, clubbing, none, atrophy- none, strength- nl Neuro- grossly intact to observation

## 2013-07-21 ENCOUNTER — Ambulatory Visit (INDEPENDENT_AMBULATORY_CARE_PROVIDER_SITE_OTHER): Payer: BC Managed Care – PPO

## 2013-07-21 DIAGNOSIS — J309 Allergic rhinitis, unspecified: Secondary | ICD-10-CM

## 2013-08-09 ENCOUNTER — Ambulatory Visit: Payer: BC Managed Care – PPO | Admitting: Internal Medicine

## 2013-08-14 ENCOUNTER — Telehealth: Payer: Self-pay | Admitting: Internal Medicine

## 2013-08-14 ENCOUNTER — Encounter: Payer: Self-pay | Admitting: *Deleted

## 2013-08-14 NOTE — Telephone Encounter (Signed)
Spoke with pt and advised of Dr Thurston HoleWert's recommendations.  Pt verbalized understanding and will call Dr Ardell IsaacsStark's office asap.

## 2013-08-14 NOTE — Telephone Encounter (Signed)
Spoke with pt. PCP is MW. Reports stomach cramps x2 weeks. When BM are had they are diarrhea based. Has tried Immodium with little relief. Wants to know what else she can do.  MW - please advise. Thanks.

## 2013-08-14 NOTE — Telephone Encounter (Signed)
Dr stark is her GI doctor - she should call asap for appt and let us know if has any problem getting seen this week  In meantime no dairy products or salads/undercooked veggies

## 2013-08-16 ENCOUNTER — Encounter: Payer: Self-pay | Admitting: Gastroenterology

## 2013-08-16 ENCOUNTER — Other Ambulatory Visit (INDEPENDENT_AMBULATORY_CARE_PROVIDER_SITE_OTHER): Payer: BC Managed Care – PPO

## 2013-08-16 ENCOUNTER — Ambulatory Visit (INDEPENDENT_AMBULATORY_CARE_PROVIDER_SITE_OTHER): Payer: BC Managed Care – PPO | Admitting: Gastroenterology

## 2013-08-16 VITALS — BP 100/70 | HR 72 | Ht 61.5 in | Wt 186.4 lb

## 2013-08-16 DIAGNOSIS — R197 Diarrhea, unspecified: Secondary | ICD-10-CM

## 2013-08-16 DIAGNOSIS — R109 Unspecified abdominal pain: Secondary | ICD-10-CM

## 2013-08-16 LAB — CBC WITH DIFFERENTIAL/PLATELET
BASOS ABS: 0 10*3/uL (ref 0.0–0.1)
Basophils Relative: 0.8 % (ref 0.0–3.0)
EOS ABS: 0.1 10*3/uL (ref 0.0–0.7)
Eosinophils Relative: 2.7 % (ref 0.0–5.0)
HEMATOCRIT: 38.9 % (ref 36.0–46.0)
Hemoglobin: 12.7 g/dL (ref 12.0–15.0)
LYMPHS ABS: 2.5 10*3/uL (ref 0.7–4.0)
Lymphocytes Relative: 48 % — ABNORMAL HIGH (ref 12.0–46.0)
MCHC: 32.7 g/dL (ref 30.0–36.0)
MCV: 92.6 fl (ref 78.0–100.0)
MONO ABS: 0.5 10*3/uL (ref 0.1–1.0)
Monocytes Relative: 9 % (ref 3.0–12.0)
Neutro Abs: 2 10*3/uL (ref 1.4–7.7)
Neutrophils Relative %: 39.5 % — ABNORMAL LOW (ref 43.0–77.0)
PLATELETS: 205 10*3/uL (ref 150.0–400.0)
RBC: 4.2 Mil/uL (ref 3.87–5.11)
RDW: 13.7 % (ref 11.5–14.6)
WBC: 5.2 10*3/uL (ref 4.5–10.5)

## 2013-08-16 LAB — COMPREHENSIVE METABOLIC PANEL
ALBUMIN: 4.2 g/dL (ref 3.5–5.2)
ALK PHOS: 71 U/L (ref 39–117)
ALT: 15 U/L (ref 0–35)
AST: 19 U/L (ref 0–37)
BILIRUBIN TOTAL: 0.6 mg/dL (ref 0.3–1.2)
BUN: 20 mg/dL (ref 6–23)
CO2: 26 mEq/L (ref 19–32)
Calcium: 9.7 mg/dL (ref 8.4–10.5)
Chloride: 104 mEq/L (ref 96–112)
Creatinine, Ser: 1 mg/dL (ref 0.4–1.2)
GFR: 71.64 mL/min (ref >60.00)
Glucose, Bld: 93 mg/dL (ref 70–99)
Potassium: 3.9 mEq/L (ref 3.5–5.1)
Sodium: 139 mEq/L (ref 135–145)
Total Protein: 7.4 g/dL (ref 6.0–8.3)

## 2013-08-16 LAB — TSH: TSH: 1.1 u[IU]/mL (ref 0.35–5.50)

## 2013-08-16 MED ORDER — METRONIDAZOLE 250 MG PO TABS: 250.0000 mg | ORAL_TABLET | Freq: Three times a day (TID) | ORAL | Status: DC

## 2013-08-16 NOTE — Patient Instructions (Addendum)
Your physician has requested that you go to the basement for the following lab work before leaving today: CBC CMET TSH C-Diff Stool Culture  We have sent the following medications to your pharmacy for you to pick up at your convenience: Flagyl 250 mg, please take one tablet by mouth three times daily for ten days. *PLEASE DO NOT START FLAGYL UNTIL AFTER STOOL STUDIES HAVE BEEN COLLECTED

## 2013-08-16 NOTE — Progress Notes (Signed)
08/16/2013 Catherine Hoffman [id number] Apr 25, 1941   HISTORY OF PRESENT ILLNESS:   This is a pleasant 73 year old female who is known to [name] for previous colonoscopy in June 2010. At that time the study was normal except for internal hemorrhoids. She presents to our office today with complaints of diarrhea and cramping abdominal pain, which has been present for the past 2 weeks. She says that prior to 2 weeks ago her bowel movements were normal and she did not have any issues with diarrhea. Now she has been having bowel movements after every time that she eats; within 10-15 minutes she has to use the restroom. Also sometimes passing liquid stool when she urinates and sometimes when she coughs as well. She denies any blood in her stool. The abdominal pain is described as cramping and is diffuse across her abdomen. She denies any recent antibiotic use, recent travel, or changes in her daily medications. For the past 2 days she has been trying to avoid dairy and a lot of raw fruits and vegetables to see if that helps. She denies nausea, vomiting, fevers, chills.    Past Medical History  Diagnosis Date  . Esophageal reflux   . Hypertension   . Episodic tension type headache   . Hyperlipidemia   . Arthritis   . Left rotator cuff tear   . Kidney stone on left side Hx-date unknown   Past Surgical History  Procedure Laterality Date  . Total abdominal hysterectomy w/ bilateral salpingoophorectomy  1990's  . Knee arthroscopy Left 2004  . Shoulder open rotator cuff repair Left 08/30/2012    Procedure: ROTATOR CUFF REPAIR SHOULDER OPEN, POSSIBLE GRAFT AND ANCHORS;  Surgeon: Jacki Conesonald A Gioffre, MD;  [facility] ;  Service: Orthopedics;  Laterality: Left;  . Colonoscopy  11/20/2008    reports that she has never smoked. She has never used smokeless tobacco. She reports that she does not drink alcohol or use illicit drugs. family history includes Cancer in her mother; Diabetes in  her mother; Hypertension in her mother. Allergies  Allergen Reactions  . Hydrocodone-Acetaminophen     REACTION: GI upset  . Tramadol Itching      Outpatient Encounter Prescriptions as of 08/16/2013  Medication Sig  . acetaminophen (TYLENOL) 500 MG tablet Per bottle as needed for pain  . aspirin 81 MG tablet Take 81 mg by mouth daily.    . Azelastine-Fluticasone (DYMISTA) 137-50 MCG/ACT SUSP Place 1 puff into the nose 2 (two) times daily.  . calcium-vitamin D (OSCAL WITH D) 500-200 MG-UNIT per tablet Take 1 tablet by mouth 2 (two) times daily.    . cetirizine (ZYRTEC) 10 MG tablet Take in am daily as needed for drippy nose  . chlorpheniramine (CHLOR-TRIMETON) 4 MG tablet Take 4 mg by mouth at bedtime.  . docusate sodium (COLACE) 100 MG capsule 1-2 at bedtime as needed  . estradiol (ESTRACE) 0.5 MG tablet Take 0.5 mg by mouth daily.  . famotidine (PEPCID) 20 MG tablet Take 20 mg by mouth as needed.   . gabapentin (NEURONTIN) 100 MG capsule Take 100 mg by mouth as needed. One three times daily  . meclizine (ANTIVERT) 25 MG tablet Take 25 mg by mouth every 4 (four) hours as needed for dizziness.  . meloxicam (MOBIC) 7.5 MG tablet Take 7.5 mg by mouth 2 (two) times daily as needed.   . methocarbamol (ROBAXIN) 500 MG tablet Take 1 tablet (500 mg total) by mouth every 6 (six) hours as needed.  .Marland Kitchen  omeprazole (PRILOSEC) 40 MG capsule TAKE 1 CAPSULE 30 MINUTES BEFORE FIRST AND LAST MEAL OF THE DAY.  Marland Kitchen oxyCODONE-acetaminophen (PERCOCET/ROXICET) 5-325 MG per tablet Take 1 tablet by mouth every 4 (four) hours as needed. (this has tylenol in it)  . potassium chloride SA (K-DUR,KLOR-CON) 20 MEQ tablet TAKE ONE TABLET BY MOUTH ONE TIME DAILY  . promethazine (PHENERGAN) 25 MG tablet 1 every 4-6 hours as needed for nausea/vomiting  . spironolactone (ALDACTONE) 25 MG tablet TAKE ONE TABLET BY MOUTH TWICE DAILY  . traZODone (DESYREL) 50 MG tablet 1-2 at bedtime as needed for trouble sleeping  .  metroNIDAZOLE (FLAGYL) 250 MG tablet Take 1 tablet (250 mg total) by mouth 3 (three) times daily.     REVIEW OF SYSTEMS  : All other systems reviewed and negative except where noted in the History of Present Illness.   PHYSICAL EXAM: BP 100/70  Pulse 72  Ht 5' 1.5" (1.562 m)  Wt 186 lb 6 oz (84.539 kg)  BMI 34.65 kg/m2 General: Well developed black female in no acute distress Head: Normocephalic and atraumatic Eyes:  Sclerae anicteric, conjunctiva pink. Ears: Normal auditory acuity. Lungs: Clear throughout to auscultation Heart: Regular rate and rhythm Abdomen: Soft, non-distended.  Slightly hyperactive bowel sounds.  Mild diffuse TTP without R/R/G. Musculoskeletal: Symmetrical with no gross deformities  Skin: No lesions on visible extremities Extremities: No edema  Neurological: Alert oriented x 4, grossly non-focal. Psychological:  Alert and cooperative. Normal mood and affect  ASSESSMENT AND PLAN: -Two-week history of diarrhea and crampy abdominal pain, which was sudden in onset. Sound infectious in nature. We will check stool studies for C. difficile and culture for enteric pathogens. Check a CBC, CMP, and TSH as well since there are no recent labs.  Once she's collected the stool studies she will begin an empiric treatment of Flagyl 250 mg 3 times a day for 10 days

## 2013-08-16 NOTE — Progress Notes (Signed)
Reviewed and agree with management plan.  Jessilynn Taft T. Challen Spainhour, MD FACG 

## 2013-08-18 LAB — CLOSTRIDIUM DIFFICILE BY PCR: Toxigenic C. Difficile by PCR: NOT DETECTED

## 2013-08-20 LAB — STOOL CULTURE

## 2013-08-21 ENCOUNTER — Ambulatory Visit (INDEPENDENT_AMBULATORY_CARE_PROVIDER_SITE_OTHER): Payer: BC Managed Care – PPO

## 2013-08-21 DIAGNOSIS — J309 Allergic rhinitis, unspecified: Secondary | ICD-10-CM | POA: Diagnosis not present

## 2013-08-24 ENCOUNTER — Ambulatory Visit (INDEPENDENT_AMBULATORY_CARE_PROVIDER_SITE_OTHER): Payer: BC Managed Care – PPO

## 2013-08-24 DIAGNOSIS — J309 Allergic rhinitis, unspecified: Secondary | ICD-10-CM | POA: Diagnosis not present

## 2013-08-25 ENCOUNTER — Telehealth: Payer: Self-pay | Admitting: *Deleted

## 2013-08-25 NOTE — Telephone Encounter (Signed)
error 

## 2013-08-28 ENCOUNTER — Ambulatory Visit (INDEPENDENT_AMBULATORY_CARE_PROVIDER_SITE_OTHER): Payer: BC Managed Care – PPO

## 2013-08-28 DIAGNOSIS — J309 Allergic rhinitis, unspecified: Secondary | ICD-10-CM

## 2013-08-31 ENCOUNTER — Ambulatory Visit (INDEPENDENT_AMBULATORY_CARE_PROVIDER_SITE_OTHER): Payer: BC Managed Care – PPO

## 2013-08-31 DIAGNOSIS — J309 Allergic rhinitis, unspecified: Secondary | ICD-10-CM

## 2013-09-01 ENCOUNTER — Encounter: Payer: Self-pay | Admitting: *Deleted

## 2013-09-04 ENCOUNTER — Ambulatory Visit (INDEPENDENT_AMBULATORY_CARE_PROVIDER_SITE_OTHER): Payer: BC Managed Care – PPO

## 2013-09-04 DIAGNOSIS — J309 Allergic rhinitis, unspecified: Secondary | ICD-10-CM

## 2013-09-05 ENCOUNTER — Encounter: Payer: Self-pay | Admitting: Gastroenterology

## 2013-09-05 ENCOUNTER — Ambulatory Visit (INDEPENDENT_AMBULATORY_CARE_PROVIDER_SITE_OTHER): Payer: BC Managed Care – PPO | Admitting: Gastroenterology

## 2013-09-05 VITALS — BP 108/72 | HR 80 | Ht 62.5 in | Wt 183.0 lb

## 2013-09-05 DIAGNOSIS — R109 Unspecified abdominal pain: Secondary | ICD-10-CM | POA: Diagnosis not present

## 2013-09-05 DIAGNOSIS — R197 Diarrhea, unspecified: Secondary | ICD-10-CM

## 2013-09-05 NOTE — Progress Notes (Signed)
09/05/2013 Catherine EvertsLuebelle Hoffman [id number] Mar 22, 1941   History of Present Illness:  Patient is here today for follow-up of her diarrhea (see note from 3/11 for further details).  Diarrhea was acute and sudden in onset, making it suspicious for an infectious source.  Cdiff was negative as well as stool for enteric pathogens.  CBC, CMP, and TSH was normal.  She was placed empirically on flagyl 250 mg TID for ten days, which she took and then started a probiotic.  She is feeling much better and stools have been back to normal for about a week.  Current Medications, Allergies, Past Medical History, Past Surgical History, Family History and Social History were reviewed in [facility] electronic medical record.   Physical Exam: BP 108/72  Pulse 80  Ht 5' 2.5" (1.588 m)  Wt 183 lb (83.008 kg)  BMI 32.92 kg/m2 General: Well developed black female in no acute distress Head: Normocephalic and atraumatic Eyes:  Sclerae anicteric, conjunctiva pink  Ears: Normal auditory acuity Musculoskeletal: Symmetrical with no gross deformities  Extremities: No edema  Neurological: Alert oriented x 4, grossly non-focal Psychological:  Alert and cooperative. Normal mood and affect  Assessment and Recommendations: -Diarrhea, was likely infectious source.  Resolved after empiric treatment with flagyl 250 mg TID for ten days and then probiotic.  Continue probiotic as desired.  Follow-up as needed.

## 2013-09-05 NOTE — Patient Instructions (Signed)
Please follow up as needed 

## 2013-09-05 NOTE — Progress Notes (Signed)
Reviewed and agree with management plan.  Aristeo Hankerson T. Claritza July, MD FACG 

## 2013-09-07 ENCOUNTER — Ambulatory Visit (INDEPENDENT_AMBULATORY_CARE_PROVIDER_SITE_OTHER): Payer: BC Managed Care – PPO

## 2013-09-07 DIAGNOSIS — J309 Allergic rhinitis, unspecified: Secondary | ICD-10-CM

## 2013-09-11 ENCOUNTER — Ambulatory Visit (INDEPENDENT_AMBULATORY_CARE_PROVIDER_SITE_OTHER): Payer: BC Managed Care – PPO

## 2013-09-11 DIAGNOSIS — J309 Allergic rhinitis, unspecified: Secondary | ICD-10-CM | POA: Diagnosis not present

## 2013-09-14 ENCOUNTER — Ambulatory Visit (INDEPENDENT_AMBULATORY_CARE_PROVIDER_SITE_OTHER): Payer: BC Managed Care – PPO

## 2013-09-14 DIAGNOSIS — J309 Allergic rhinitis, unspecified: Secondary | ICD-10-CM

## 2013-09-18 ENCOUNTER — Ambulatory Visit: Payer: BC Managed Care – PPO | Admitting: Internal Medicine

## 2013-09-18 ENCOUNTER — Ambulatory Visit (INDEPENDENT_AMBULATORY_CARE_PROVIDER_SITE_OTHER): Payer: BC Managed Care – PPO

## 2013-09-18 DIAGNOSIS — J309 Allergic rhinitis, unspecified: Secondary | ICD-10-CM | POA: Diagnosis not present

## 2013-09-21 ENCOUNTER — Ambulatory Visit (INDEPENDENT_AMBULATORY_CARE_PROVIDER_SITE_OTHER): Payer: BC Managed Care – PPO

## 2013-09-21 DIAGNOSIS — J309 Allergic rhinitis, unspecified: Secondary | ICD-10-CM | POA: Diagnosis not present

## 2013-09-25 ENCOUNTER — Ambulatory Visit (INDEPENDENT_AMBULATORY_CARE_PROVIDER_SITE_OTHER): Payer: BC Managed Care – PPO

## 2013-09-25 DIAGNOSIS — J309 Allergic rhinitis, unspecified: Secondary | ICD-10-CM | POA: Diagnosis not present

## 2013-09-26 ENCOUNTER — Ambulatory Visit (INDEPENDENT_AMBULATORY_CARE_PROVIDER_SITE_OTHER): Payer: BC Managed Care – PPO | Admitting: Internal Medicine

## 2013-09-26 ENCOUNTER — Ambulatory Visit (INDEPENDENT_AMBULATORY_CARE_PROVIDER_SITE_OTHER)
Admission: RE | Admit: 2013-09-26 | Discharge: 2013-09-26 | Disposition: A | Discharge status: Home / Self Care | Payer: BC Managed Care – PPO | Source: Ambulatory Visit | Attending: Internal Medicine | Admitting: Internal Medicine

## 2013-09-26 ENCOUNTER — Encounter: Payer: Self-pay | Admitting: Internal Medicine

## 2013-09-26 ENCOUNTER — Ambulatory Visit (INDEPENDENT_AMBULATORY_CARE_PROVIDER_SITE_OTHER): Payer: BC Managed Care – PPO

## 2013-09-26 VITALS — BP 126/80 | HR 71 | Temp 97.6°F | Ht 61.5 in | Wt 185.2 lb

## 2013-09-26 DIAGNOSIS — E7800 Pure hypercholesterolemia, unspecified: Secondary | ICD-10-CM | POA: Diagnosis not present

## 2013-09-26 DIAGNOSIS — K219 Gastro-esophageal reflux disease without esophagitis: Secondary | ICD-10-CM | POA: Diagnosis not present

## 2013-09-26 DIAGNOSIS — J309 Allergic rhinitis, unspecified: Secondary | ICD-10-CM

## 2013-09-26 DIAGNOSIS — I10 Essential (primary) hypertension: Secondary | ICD-10-CM

## 2013-09-26 NOTE — Progress Notes (Signed)
Subjective:    Patient ID: Catherine Hoffman, female    DOB: Sep 09, 1940 .   MRN: [id number]  Brief patient profile:  [id number] yobf never smoker with hypertension, degenerative arthritis, and difficulty following through on instructions regarding medication reconciliation and weight loss.     History of Present Illness  06/29/2011 f/u ov/Wert cc no  Cp, tia, claudication or limiting sob rec Cal balance reviewed for wt loss    12/13/2012 f/u ov/Wert re cough onset March 2014 p ET for shoulder  Chief Complaint  Patient presents with  . Follow-up    Pt states that her cough has improved since last visit with Dr. Shelle Ironlance. Cough is now non prod, but still coughs so hard she feels sore in her chest.   tramadol > can't take it, dry cough wakes her up each am, very uncertain with meds despite calendar. No sob unless coughing. Hasn't tried zyrtec. Delsym not effective >>pred taper, allergist referral    12/28/2012 Follow up and Med review  Patient returns for a two-week followup and medication review. We reviewed all her medications and organized them into a medication calendar. Appears that she is taking her medications correctly. Patient was seen 2 weeks ago for persistent cough. Patient was tried on a prednisone taper, along with cough, prevention regimen with Chlor-Trimeton and reflux, preventive measures. She says that her cough is about the same, maybe slightly improved. Patient had labs done within allergy profile. That was extremely positive with a high IgE. She was referred to an allergist with an upcoming appointment in a couple weeks. rec Follow med calendar  02/07/13 allergy eval Dr Maple HudsonYoung rec Script sent to try Singulair/ montelukast- one daily for allergic nose and cough Sample Dymista nasal spray-  1-2 puffs each nostril once every day at bedtime. Try this instead of flonase/ fluticasone. When the sample runs out, go back to flonase Try otc allergy eyedrop [place] for allergy in  eyes Schedule allergy skin tests   03/28/2013 f/u ov/Wert re: cough ever since ET / no better on singulair or flonase "nothing helps" Chief Complaint  Patient presents with  . Follow-up    Pt states cough has been worse for the past wk- mainly non prod and worse with talking.     Take delsym two tsp every 12 hours and supplement if needed with oxycodone(percocet) 1 every 4 hours to suppress the urge to cough.    Prednisone 10 mg take  4 each am x 2 days,   2 each am x 2 days,  1 each am x 2 days and stop  Stop flonase and oscal D  Start dymista one twice daily  Start neurontin 100 mg three times daily with meals  Increase the pm dose of chlortrimeton to 4mg  x 2 at bedtime  Please see patient coordinator before you leave today  to schedule sinus ct > neg    04/12/2013 f/u ov/Wert re: cough Chief Complaint  Patient presents with  . Follow-up    Pt reports cough is about 90% better.    Confused with maint vs prns, has a wheel with all meds for the day in the same slot - no longer needing prn cough meds, cough is dyay > noct, dry, worse with voice use >>Dymista in place of flonase   05/24/2013 Follow up and Med review  Patient returns for a 6 week followup. Patient reports that she is doing okay. Does report that her cough started come back. A few weeks ago. Feels  that it is allergy related with drainage in the back of her throat. She denies any fever or purulent sputum, chest pain, orthopnea, PND, or leg swelling. We reviewed all her medications updated. Her medication calendar with patient education. Patient has not started Zyrtec for Delsym for her return to cough. We talked about preventative regimen to help with her cough suppression rec Follow med calendar closely and bring to each visit.  Follow up with Dr. Maple Hudson  As planned next month  Follow up Dr. [name]  In 3 months for physical .     07/2013 rx allergy shots    09/26/2013 f/u ov/Wert re: HBP, GERD, DJD, chronic  rhinitis Chief Complaint  Patient presents with  . Annual Exam    Pt not fasting today.  She is still c/o diarrhea and cramping-seeing GI for this problem.      Not limited by breathing from desired activities  - no tia, claudication, exertional cp.  No obvious day to day or daytime variabilty or assoc chronic cough or cp or chest tightness, subjective wheeze overt sinus or hb symptoms. No unusual exp hx or h/o childhood pna/ asthma or knowledge of premature birth.  Sleeping ok without nocturnal  or early am exacerbation  of respiratory  c/o's or need for noct saba. Also denies any obvious fluctuation of symptoms with weather or environmental changes or other aggravating or alleviating factors except as outlined above   Current Medications, Allergies, Complete Past Medical History, Past Surgical History, Family History, and Social History were reviewed in [employer] record.  ROS  The following are not active complaints unless bolded sore throat, dysphagia, dental problems, itching, sneezing,  nasal congestion or excess/ purulent secretions, ear ache,   fever, chills, sweats, unintended wt loss, pleuritic or exertional cp, hemoptysis,  orthopnea pnd or leg swelling, presyncope, palpitations, heartburn, abdominal pain, anorexia, nausea, vomiting, diarrhea  or change in bowel or urinary habits, change in stools or urine, dysuria,hematuria,  rash, arthralgias, visual complaints, headache, numbness weakness or ataxia or problems with walking or coordination,  change in mood/affect or memory.             Past Medical History:  GERD(ICD-530.81)..............................................................................Marland KitchenRussella Dar  - EGD 04/28/2001 esophagitis with HH  Chronic constipation  Internal Hemorrhoids  - Colonoscopy 5/ 2006  - Colonoscopy ok except hem 11/20/08  MORBID OBESITY (ICD-278.01)  - Target wt = 158 for BMI < 30  EPISODIC TENSION TYPE HEADACHE (ICD-339.11)   Hypertension  Degenerative arthritis  - Arthroscopy Left knee, Dr Barry Dienes  Radicular back/ L leg pain 08/13/2011 > referred to [facility] > Gioffre rec mri ? done Herpes zoster-1998 with no significant residual R T 12  Hyperlipidemia  - Targe LDL < 160 (HBP only known RF)  Health Maintenance................................................................................Marland KitchenWert   - Pneumovax February 25, 2009  - Td February 25, 2009  - CPX  09/26/2013  - GYN Richardson -med calendar 12/21/2011 ,  12/29/2012 , 05/24/2013, 05/23/13   Family History:  positive for hypertension and diabetes in her mother  cancer of the leg in her mother does not know the type  She has no full siblings does not know her father's history  no gallbladder disease to her knowledge  No FH of Colon Cancer:   Social History:  she denies ever smoking  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        Objective:   Physical Exam  Wt Readings from Last 3 Encounters:  09/26/13 185 lb 3.2 oz (84.006 kg)  09/05/13 183 lb (83.008 kg)  08/16/13 186 lb 6 oz (84.539 kg)         GENERAL:  Hoarse amb wf    HEENT: Mouth:  Top dentures  EACs-clear, TMs-wnl, NOSE-mod nonspecific Turbinate edema, THROAT-clear & wnl.  NECK: Supple w/ fair ROM; no JVD; normal carotid impulses w/o bruits; no thyromegaly or nodules palpated; no lymphadenopathy.  CHEST: Clear to P & A; w/o, wheezes/ rales/ or rhonchi.  HEART: RRR, no m/r/g heard. pedal pulses sym reduced bilaterally  ABDOMEN: Soft & nt; nml bowel sounds; no organomegaly or masses detected.  EXT: Warm bilat, no calf pain, edema, clubbing, pulses intact MS nl gait,  No deformities  Skin No rash.     CXR  09/26/2013 : Mediastinum hilar structures are normal. Stable cardiomegaly. Normal pulmonary vascularity. Mild stable interstitial prominence  noted suggesting interstitial fibrosis. No pleural effusion or pneumothorax. Degenerative changes thoracic spine.      Assessment & Plan:

## 2013-09-26 NOTE — Progress Notes (Signed)
Quick Note:  Spoke with pt and notified of results per Dr. Wert. Pt verbalized understanding and denied any questions.  ______ 

## 2013-09-26 NOTE — Patient Instructions (Addendum)
Please remember to go to the   x-ray department downstairs for your tests - we will call you with the results when they are available.  You will need to return fasting for your blood work  See calendar for specific medication instructions and bring it back for each and every office visit for every healthcare provider you see.  Without it,  you may not receive the best quality medical care that we feel you deserve.  You will note that the calendar groups together  your maintenance  medications that are timed at particular times of the day.  Think of this as your checklist for what your doctor has instructed you to do until your next evaluation to see what benefit  there is  to staying on a consistent group of medications intended to keep you well.  The other group at the bottom is entirely up to you to use as you see fit  for specific symptoms that may arise between visits that require you to treat them on an as needed basis.  Think of this as your action plan or "what if" list.   Separating the top medications from the bottom group is fundamental to providing you adequate care going forward.    Please schedule a follow up visit in 3 months but call sooner if needed to see Tammy NP with all your active meds in hand

## 2013-09-28 ENCOUNTER — Ambulatory Visit (INDEPENDENT_AMBULATORY_CARE_PROVIDER_SITE_OTHER): Payer: BC Managed Care – PPO

## 2013-09-28 DIAGNOSIS — J309 Allergic rhinitis, unspecified: Secondary | ICD-10-CM | POA: Diagnosis not present

## 2013-09-28 NOTE — Assessment & Plan Note (Signed)
-   Target LDL < 160 due to single risk factor HBP  Lab Results  Component Value Date   CHOL 212* 05/30/2012   HDL 54.70 05/30/2012   LDLDIRECT 150.2 05/30/2012   TRIG 46.0 05/30/2012   CHOLHDL 4 05/30/2012    Needs to return fasting for lipid profile

## 2013-09-28 NOTE — Assessment & Plan Note (Signed)
Eval by [facility]/ Stark    - EGD 04/28/2001 esophagitis with HH   F/u Dr Russella DarStark

## 2013-09-28 NOTE — Assessment & Plan Note (Signed)
-   Allergy profile 12/13/12 > IgE over 800 > referred to Dr Maple HudsonYoung  > plan for allergy testing Jan 2014  - Trial of singulair > d/c 03/28/2013 as no better  Allergy Skin test 06/14/13- Strong positive puncture reactions, no I.D tests. Given allegra Start allergy vaccine 07/20/13  Adequate control on present rx, reviewed > no change in rx needed  > f/u Dr Maple HudsonYoung

## 2013-09-28 NOTE — Assessment & Plan Note (Signed)
Lab Results  Component Value Date   CREATININE 1.0 08/16/2013   CREATININE 0.96 08/30/2012   CREATININE 1.0 05/30/2012     Adequate control on present rx, reviewed > no change in rx needed

## 2013-09-28 NOTE — Assessment & Plan Note (Addendum)
-   Target wt = 158 for BMI < 30   Lab Results  Component Value Date   TSH 1.10 08/16/2013     Wt Readings from Last 3 Encounters:  09/26/13 185 lb 3.2 oz (84.006 kg)  09/05/13 183 lb (83.008 kg)  08/16/13 186 lb 6 oz (84.539 kg)      Reviewed need for neg cal balance, surprised she's not loosing wt with as much diarrhea as she describes ? Functional?

## 2013-10-02 ENCOUNTER — Ambulatory Visit (INDEPENDENT_AMBULATORY_CARE_PROVIDER_SITE_OTHER): Payer: BC Managed Care – PPO

## 2013-10-02 DIAGNOSIS — J309 Allergic rhinitis, unspecified: Secondary | ICD-10-CM | POA: Diagnosis not present

## 2013-10-05 ENCOUNTER — Ambulatory Visit (INDEPENDENT_AMBULATORY_CARE_PROVIDER_SITE_OTHER): Payer: BC Managed Care – PPO

## 2013-10-05 DIAGNOSIS — J309 Allergic rhinitis, unspecified: Secondary | ICD-10-CM | POA: Diagnosis not present

## 2013-10-09 ENCOUNTER — Ambulatory Visit (INDEPENDENT_AMBULATORY_CARE_PROVIDER_SITE_OTHER): Payer: BC Managed Care – PPO

## 2013-10-09 DIAGNOSIS — J309 Allergic rhinitis, unspecified: Secondary | ICD-10-CM | POA: Diagnosis not present

## 2013-10-13 ENCOUNTER — Ambulatory Visit (INDEPENDENT_AMBULATORY_CARE_PROVIDER_SITE_OTHER): Payer: BC Managed Care – PPO

## 2013-10-13 DIAGNOSIS — J309 Allergic rhinitis, unspecified: Secondary | ICD-10-CM | POA: Diagnosis not present

## 2013-10-16 ENCOUNTER — Other Ambulatory Visit: Payer: Self-pay | Admitting: Internal Medicine

## 2013-10-16 ENCOUNTER — Ambulatory Visit (INDEPENDENT_AMBULATORY_CARE_PROVIDER_SITE_OTHER): Payer: BC Managed Care – PPO

## 2013-10-16 DIAGNOSIS — J309 Allergic rhinitis, unspecified: Secondary | ICD-10-CM | POA: Diagnosis not present

## 2013-10-17 ENCOUNTER — Encounter: Payer: Self-pay | Admitting: Internal Medicine

## 2013-10-17 ENCOUNTER — Ambulatory Visit (INDEPENDENT_AMBULATORY_CARE_PROVIDER_SITE_OTHER): Payer: BC Managed Care – PPO

## 2013-10-17 ENCOUNTER — Ambulatory Visit (INDEPENDENT_AMBULATORY_CARE_PROVIDER_SITE_OTHER): Payer: BC Managed Care – PPO | Admitting: Internal Medicine

## 2013-10-17 VITALS — BP 130/74 | HR 58 | Ht 62.0 in | Wt 186.4 lb

## 2013-10-17 DIAGNOSIS — R05 Cough: Secondary | ICD-10-CM

## 2013-10-17 DIAGNOSIS — J309 Allergic rhinitis, unspecified: Secondary | ICD-10-CM

## 2013-10-17 DIAGNOSIS — R059 Cough, unspecified: Secondary | ICD-10-CM | POA: Diagnosis not present

## 2013-10-17 NOTE — Patient Instructions (Signed)
We can continue building your allergy vaccine  Please call as needed

## 2013-10-17 NOTE — Assessment & Plan Note (Signed)
So far she is doing well, building with no reactions or concerns. She has been much more comfortable this spring.

## 2013-10-17 NOTE — Assessment & Plan Note (Signed)
She says cough is gone since starting allergy vaccine. This would be early in build-up. Glad she is better for whatever reason.

## 2013-10-17 NOTE — Progress Notes (Signed)
02/07/13- 72 yoF never smoker followed by Dr Sherene SiresWert for cough attributed to Upper airway cough/ postnasal drip. Referred by MW; allergy labs done on 12-13-12- for allergy evaluation. Allergy Profile 12/13/12- Total IgE 806.1, broad elevations to environmental allergens. Prod cough. Allergy vaccine 10 years ago elsewhere, stopped because of local reactions. Vaccine did not help mild wheeze, rhinitis, conjunctivitis.  This year similar eye and nose symptoms. Occasional mild wheeze no benefit with Flonase, Zyrtec, Pepcid, Chlor-Trimeton. Main complaint is cough. Lips itching. No phlegm. Little sneeze and some wheeze. [place] in [place] in [place]. Denies history of asthma. On Pepcid plus omeprazole and not aware of reflux. Admits postnasal drip. Dry itching skin without rash. Bananas cause nausea, peaches and grapes make her lips itch. Allergy Profile 12/13/2012-total IgE 806.1 with significant elevations for common environmental allergens. Environmental-house with no basement, central air, carpet, no pets or smokers  06/14/13- 72 yoF never smoker followed by Dr Sherene SiresWert for cough attributed to Upper airway cough/ postnasal drip. Allergic rhinitis, Food allergy Comes today for allergy testing. Still some cough and nasal congestion but no wheeze. No problems with foods as long as she avoids bananas, peaches and grapes. Points out some dermatitis on her ankles. Allergy Skin test 06/14/13- Strong positive puncture reactions, no I.D tests. Given allegra.  07/20/12-  72 yoF never smoker followed for Allergic rhinitis, Food allergy. Followed  by Dr Sherene SiresWert for cough attributed to Upper airway cough/ postnasal drip. Complicated by GERD, obesity FOLLOWS FOR:  Pt reports cough is better -- still having some sinus pressure and itchy ears with PND She had hx of strong local reactions to allergy shots at another office many years ago. She wanted to discuss another try. We reviewed her symptom pattern and skin test  results.  Dymista some help. Still some post nasal drip.   10/17/13-  72 yoF never smoker followed for Allergic rhinitis, Food allergy. Followed  by Dr Sherene SiresWert for cough attributed to Upper airway cough/ postnasal drip. Complicated by GERD, obesity FOLLOWS FOR: Pt states she can tell a huge difference being on allergy injections. Now building vaccine here at 1:5000. No problem with reactions. She has stayed in some, avoiding pollen. She is very pleased. Cough is gone. CXR 09/26/13 IMPRESSION:  1. No acute cardiopulmonary disease.  2. Stable cardiomegaly.  Electronically Signed  By: Maisie Fushomas Register  On: 09/26/2013 14:09   ROS-see HPI Constitutional:   No-   weight loss, night sweats, fevers, chills, fatigue, lassitude. HEENT:   No-  headaches, difficulty swallowing, tooth/dental problems, sore throat,       No- sneezing,itching, no-ear ache, nasal congestion, +post nasal drip,  CV:  No-   chest pain, orthopnea, PND, swelling in lower extremities, anasarca, dizziness, palpitations Resp: No-   shortness of breath with exertion or at rest.              No-productive cough,  No non-productive cough,  No- coughing up of blood.              No-   change in color of mucus.  No- wheezing.   Skin: No-   rash or lesions. GI:  No-   heartburn, indigestion, abdominal pain, nausea, vomiting, GU:  MS:  No-   joint pain or swelling.   Neuro-     nothing unusual Psych:  No- change in mood or affect. No depression or anxiety.  No memory loss.  OBJ- Physical Exam General- Alert, Oriented, Affect-appropriate, Distress- none acute Skin-  rash-none, lesions- none, excoriation- none Lymphadenopathy- none Head- atraumatic            Eyes- Gross vision intact, PERRLA, conjunctivae and secretions clear. +periorbital                      edema            Ears- Hearing, canals-normal            Nose- +very pale mucosa, no-Septal dev, mucus, polyps, erosion, perforation             Throat- Mallampati III , mucosa  clear , drainage- none, tonsils- atrophic, +dentures Neck- flexible , trachea midline, no stridor , thyroid nl, carotid no bruit Chest - symmetrical excursion , unlabored           Heart/CV- RRR , no murmur , no gallop  , no rub, nl s1 s2                           - JVD- none , edema- none, stasis changes- none, varices- none           Lung- clear to P&A, wheeze- none, cough-none , dullness-none, rub- none           Chest wall-  Abd-  Br/ Gen/ Rectal- Not done, not indicated Extrem- cyanosis- none, clubbing, none, atrophy- none, strength- nl Neuro- grossly intact to observation

## 2013-10-19 ENCOUNTER — Ambulatory Visit (INDEPENDENT_AMBULATORY_CARE_PROVIDER_SITE_OTHER): Payer: BC Managed Care – PPO

## 2013-10-19 DIAGNOSIS — J309 Allergic rhinitis, unspecified: Secondary | ICD-10-CM

## 2013-10-20 ENCOUNTER — Ambulatory Visit: Payer: BC Managed Care – PPO

## 2013-10-23 ENCOUNTER — Ambulatory Visit (INDEPENDENT_AMBULATORY_CARE_PROVIDER_SITE_OTHER): Payer: BC Managed Care – PPO

## 2013-10-23 DIAGNOSIS — J309 Allergic rhinitis, unspecified: Secondary | ICD-10-CM | POA: Diagnosis not present

## 2013-10-26 ENCOUNTER — Ambulatory Visit (INDEPENDENT_AMBULATORY_CARE_PROVIDER_SITE_OTHER): Payer: BC Managed Care – PPO

## 2013-10-26 DIAGNOSIS — J309 Allergic rhinitis, unspecified: Secondary | ICD-10-CM

## 2013-10-31 ENCOUNTER — Ambulatory Visit (INDEPENDENT_AMBULATORY_CARE_PROVIDER_SITE_OTHER): Payer: BC Managed Care – PPO

## 2013-10-31 DIAGNOSIS — J309 Allergic rhinitis, unspecified: Secondary | ICD-10-CM

## 2013-11-03 ENCOUNTER — Ambulatory Visit (INDEPENDENT_AMBULATORY_CARE_PROVIDER_SITE_OTHER): Payer: BC Managed Care – PPO

## 2013-11-03 DIAGNOSIS — J309 Allergic rhinitis, unspecified: Secondary | ICD-10-CM | POA: Diagnosis not present

## 2013-11-06 ENCOUNTER — Ambulatory Visit (INDEPENDENT_AMBULATORY_CARE_PROVIDER_SITE_OTHER): Payer: BC Managed Care – PPO

## 2013-11-06 DIAGNOSIS — J309 Allergic rhinitis, unspecified: Secondary | ICD-10-CM | POA: Diagnosis not present

## 2013-11-07 ENCOUNTER — Encounter: Payer: Self-pay | Admitting: Internal Medicine

## 2013-11-09 ENCOUNTER — Ambulatory Visit (INDEPENDENT_AMBULATORY_CARE_PROVIDER_SITE_OTHER): Payer: BC Managed Care – PPO

## 2013-11-09 DIAGNOSIS — J309 Allergic rhinitis, unspecified: Secondary | ICD-10-CM

## 2013-11-10 ENCOUNTER — Ambulatory Visit (INDEPENDENT_AMBULATORY_CARE_PROVIDER_SITE_OTHER): Payer: BC Managed Care – PPO

## 2013-11-10 DIAGNOSIS — J309 Allergic rhinitis, unspecified: Secondary | ICD-10-CM | POA: Diagnosis not present

## 2013-11-13 ENCOUNTER — Ambulatory Visit (INDEPENDENT_AMBULATORY_CARE_PROVIDER_SITE_OTHER): Payer: BC Managed Care – PPO

## 2013-11-13 DIAGNOSIS — J309 Allergic rhinitis, unspecified: Secondary | ICD-10-CM

## 2013-11-16 ENCOUNTER — Ambulatory Visit (INDEPENDENT_AMBULATORY_CARE_PROVIDER_SITE_OTHER): Payer: BC Managed Care – PPO

## 2013-11-16 DIAGNOSIS — J309 Allergic rhinitis, unspecified: Secondary | ICD-10-CM

## 2013-11-20 ENCOUNTER — Ambulatory Visit (INDEPENDENT_AMBULATORY_CARE_PROVIDER_SITE_OTHER): Payer: BC Managed Care – PPO

## 2013-11-20 DIAGNOSIS — J309 Allergic rhinitis, unspecified: Secondary | ICD-10-CM

## 2013-11-27 ENCOUNTER — Ambulatory Visit (INDEPENDENT_AMBULATORY_CARE_PROVIDER_SITE_OTHER): Payer: BC Managed Care – PPO

## 2013-11-27 DIAGNOSIS — J309 Allergic rhinitis, unspecified: Secondary | ICD-10-CM | POA: Diagnosis not present

## 2013-12-04 ENCOUNTER — Ambulatory Visit: Payer: BC Managed Care – PPO

## 2013-12-06 ENCOUNTER — Ambulatory Visit (INDEPENDENT_AMBULATORY_CARE_PROVIDER_SITE_OTHER): Payer: BC Managed Care – PPO

## 2013-12-06 DIAGNOSIS — J309 Allergic rhinitis, unspecified: Secondary | ICD-10-CM | POA: Diagnosis not present

## 2013-12-06 DIAGNOSIS — M549 Dorsalgia, unspecified: Secondary | ICD-10-CM | POA: Diagnosis not present

## 2013-12-06 DIAGNOSIS — N209 Urinary calculus, unspecified: Secondary | ICD-10-CM | POA: Diagnosis not present

## 2013-12-11 ENCOUNTER — Ambulatory Visit (INDEPENDENT_AMBULATORY_CARE_PROVIDER_SITE_OTHER): Payer: BC Managed Care – PPO

## 2013-12-11 DIAGNOSIS — J309 Allergic rhinitis, unspecified: Secondary | ICD-10-CM

## 2013-12-18 ENCOUNTER — Ambulatory Visit: Payer: BC Managed Care – PPO

## 2013-12-25 ENCOUNTER — Ambulatory Visit (INDEPENDENT_AMBULATORY_CARE_PROVIDER_SITE_OTHER): Payer: BC Managed Care – PPO

## 2013-12-25 DIAGNOSIS — J309 Allergic rhinitis, unspecified: Secondary | ICD-10-CM

## 2013-12-27 ENCOUNTER — Ambulatory Visit: Payer: BC Managed Care – PPO | Admitting: Adult Health

## 2014-01-01 ENCOUNTER — Ambulatory Visit (INDEPENDENT_AMBULATORY_CARE_PROVIDER_SITE_OTHER): Payer: BC Managed Care – PPO

## 2014-01-01 DIAGNOSIS — J309 Allergic rhinitis, unspecified: Secondary | ICD-10-CM | POA: Diagnosis not present

## 2014-01-05 ENCOUNTER — Encounter: Payer: Self-pay | Admitting: Internal Medicine

## 2014-01-08 ENCOUNTER — Ambulatory Visit (INDEPENDENT_AMBULATORY_CARE_PROVIDER_SITE_OTHER): Payer: BC Managed Care – PPO

## 2014-01-08 DIAGNOSIS — J309 Allergic rhinitis, unspecified: Secondary | ICD-10-CM

## 2014-01-10 ENCOUNTER — Other Ambulatory Visit: Payer: Self-pay | Admitting: Internal Medicine

## 2014-01-12 ENCOUNTER — Telehealth: Payer: Self-pay | Admitting: *Deleted

## 2014-01-12 NOTE — Telephone Encounter (Signed)
Called [phone] to initiate the PA for the omeprazole 40 mg  BID PT ID#  [id number]  Omeprazole 40 mg  1 po BID has been approved from July 8.2015 through 01-12-2015  i have called and spoke with the pharmacy and they did run this through and it is fine.   i have called and made the pt aware.

## 2014-01-15 ENCOUNTER — Ambulatory Visit (INDEPENDENT_AMBULATORY_CARE_PROVIDER_SITE_OTHER): Payer: BC Managed Care – PPO

## 2014-01-15 DIAGNOSIS — J309 Allergic rhinitis, unspecified: Secondary | ICD-10-CM

## 2014-01-22 ENCOUNTER — Ambulatory Visit (INDEPENDENT_AMBULATORY_CARE_PROVIDER_SITE_OTHER): Payer: BC Managed Care – PPO

## 2014-01-22 DIAGNOSIS — J309 Allergic rhinitis, unspecified: Secondary | ICD-10-CM | POA: Diagnosis not present

## 2014-01-29 ENCOUNTER — Ambulatory Visit (INDEPENDENT_AMBULATORY_CARE_PROVIDER_SITE_OTHER): Payer: BC Managed Care – PPO

## 2014-01-29 DIAGNOSIS — J309 Allergic rhinitis, unspecified: Secondary | ICD-10-CM

## 2014-02-05 ENCOUNTER — Ambulatory Visit (INDEPENDENT_AMBULATORY_CARE_PROVIDER_SITE_OTHER): Payer: BC Managed Care – PPO

## 2014-02-05 DIAGNOSIS — J309 Allergic rhinitis, unspecified: Secondary | ICD-10-CM

## 2014-02-13 ENCOUNTER — Ambulatory Visit (INDEPENDENT_AMBULATORY_CARE_PROVIDER_SITE_OTHER): Payer: BC Managed Care – PPO

## 2014-02-13 DIAGNOSIS — J309 Allergic rhinitis, unspecified: Secondary | ICD-10-CM

## 2014-02-19 ENCOUNTER — Ambulatory Visit (INDEPENDENT_AMBULATORY_CARE_PROVIDER_SITE_OTHER): Payer: BC Managed Care – PPO

## 2014-02-19 ENCOUNTER — Ambulatory Visit: Payer: BC Managed Care – PPO | Admitting: Internal Medicine

## 2014-02-19 DIAGNOSIS — J309 Allergic rhinitis, unspecified: Secondary | ICD-10-CM

## 2014-02-20 ENCOUNTER — Encounter: Payer: Self-pay | Admitting: Internal Medicine

## 2014-02-26 ENCOUNTER — Ambulatory Visit (INDEPENDENT_AMBULATORY_CARE_PROVIDER_SITE_OTHER): Payer: BC Managed Care – PPO

## 2014-02-26 DIAGNOSIS — J309 Allergic rhinitis, unspecified: Secondary | ICD-10-CM | POA: Diagnosis not present

## 2014-02-28 ENCOUNTER — Other Ambulatory Visit: Payer: Self-pay | Admitting: Internal Medicine

## 2014-03-05 ENCOUNTER — Ambulatory Visit (INDEPENDENT_AMBULATORY_CARE_PROVIDER_SITE_OTHER): Payer: BC Managed Care – PPO

## 2014-03-05 DIAGNOSIS — J309 Allergic rhinitis, unspecified: Secondary | ICD-10-CM | POA: Diagnosis not present

## 2014-03-12 ENCOUNTER — Ambulatory Visit (INDEPENDENT_AMBULATORY_CARE_PROVIDER_SITE_OTHER): Payer: BC Managed Care – PPO

## 2014-03-12 DIAGNOSIS — J309 Allergic rhinitis, unspecified: Secondary | ICD-10-CM | POA: Diagnosis not present

## 2014-03-14 ENCOUNTER — Encounter: Payer: Self-pay | Admitting: Internal Medicine

## 2014-03-19 ENCOUNTER — Ambulatory Visit (INDEPENDENT_AMBULATORY_CARE_PROVIDER_SITE_OTHER): Payer: BC Managed Care – PPO

## 2014-03-19 DIAGNOSIS — J309 Allergic rhinitis, unspecified: Secondary | ICD-10-CM | POA: Diagnosis not present

## 2014-03-26 ENCOUNTER — Ambulatory Visit (INDEPENDENT_AMBULATORY_CARE_PROVIDER_SITE_OTHER): Payer: BC Managed Care – PPO

## 2014-03-26 DIAGNOSIS — J309 Allergic rhinitis, unspecified: Secondary | ICD-10-CM | POA: Diagnosis not present

## 2014-03-27 ENCOUNTER — Telehealth: Payer: Self-pay | Admitting: Internal Medicine

## 2014-03-27 NOTE — Telephone Encounter (Signed)
Try building gradually up to 0.8 of 1:500 before moving up to 1:50

## 2014-03-27 NOTE — Telephone Encounter (Signed)
Put your recs on pt.'s allergy record. I will make up 1:500 and if she is able to build to 0.8 then we will gradually build on 1:50 also.

## 2014-03-27 NOTE — Telephone Encounter (Signed)
Catherine Hoffman had reactions to 1:50 at 0.1 & 0.05.(red,itchy,swollen and feverish(0.05 ONLY) This was 11/13/13 0.1 & 11/16/13 0.05. She has been on 1:500 at 0.5 since then.(per your request) Should we try 0.05 of 1:50 again? Please let me know as soon as you can. Pt. Will be in next Mon. For her shot. Thanks, [employer] Scott

## 2014-04-02 ENCOUNTER — Ambulatory Visit (INDEPENDENT_AMBULATORY_CARE_PROVIDER_SITE_OTHER): Payer: BC Managed Care – PPO

## 2014-04-02 DIAGNOSIS — J309 Allergic rhinitis, unspecified: Secondary | ICD-10-CM

## 2014-04-09 ENCOUNTER — Ambulatory Visit (INDEPENDENT_AMBULATORY_CARE_PROVIDER_SITE_OTHER): Payer: BC Managed Care – PPO

## 2014-04-09 DIAGNOSIS — J309 Allergic rhinitis, unspecified: Secondary | ICD-10-CM | POA: Diagnosis not present

## 2014-04-16 ENCOUNTER — Ambulatory Visit: Payer: BC Managed Care – PPO

## 2014-04-17 ENCOUNTER — Ambulatory Visit (INDEPENDENT_AMBULATORY_CARE_PROVIDER_SITE_OTHER): Payer: BC Managed Care – PPO

## 2014-04-17 DIAGNOSIS — J309 Allergic rhinitis, unspecified: Secondary | ICD-10-CM | POA: Diagnosis not present

## 2014-04-23 ENCOUNTER — Ambulatory Visit (INDEPENDENT_AMBULATORY_CARE_PROVIDER_SITE_OTHER): Payer: BC Managed Care – PPO

## 2014-04-23 DIAGNOSIS — J309 Allergic rhinitis, unspecified: Secondary | ICD-10-CM | POA: Diagnosis not present

## 2014-04-25 DIAGNOSIS — N959 Unspecified menopausal and perimenopausal disorder: Secondary | ICD-10-CM | POA: Diagnosis not present

## 2014-04-25 DIAGNOSIS — Z1231 Encounter for screening mammogram for malignant neoplasm of breast: Secondary | ICD-10-CM | POA: Diagnosis not present

## 2014-04-25 DIAGNOSIS — Z01419 Encounter for gynecological examination (general) (routine) without abnormal findings: Secondary | ICD-10-CM | POA: Diagnosis not present

## 2014-04-30 ENCOUNTER — Ambulatory Visit (INDEPENDENT_AMBULATORY_CARE_PROVIDER_SITE_OTHER): Payer: BC Managed Care – PPO

## 2014-04-30 DIAGNOSIS — J309 Allergic rhinitis, unspecified: Secondary | ICD-10-CM | POA: Diagnosis not present

## 2014-05-07 ENCOUNTER — Ambulatory Visit (INDEPENDENT_AMBULATORY_CARE_PROVIDER_SITE_OTHER): Payer: BC Managed Care – PPO

## 2014-05-07 DIAGNOSIS — J309 Allergic rhinitis, unspecified: Secondary | ICD-10-CM

## 2014-05-14 ENCOUNTER — Ambulatory Visit (INDEPENDENT_AMBULATORY_CARE_PROVIDER_SITE_OTHER): Payer: BC Managed Care – PPO

## 2014-05-14 DIAGNOSIS — J309 Allergic rhinitis, unspecified: Secondary | ICD-10-CM

## 2014-05-20 IMAGING — CR DG CHEST 2V
2 series · 2 of 2 positions shown · non-contrast
Comparison: September 25, 2010.

CLINICAL DATA: Chronic cough

CHEST - 2 VIEW

[view not recorded (1 of 2)]
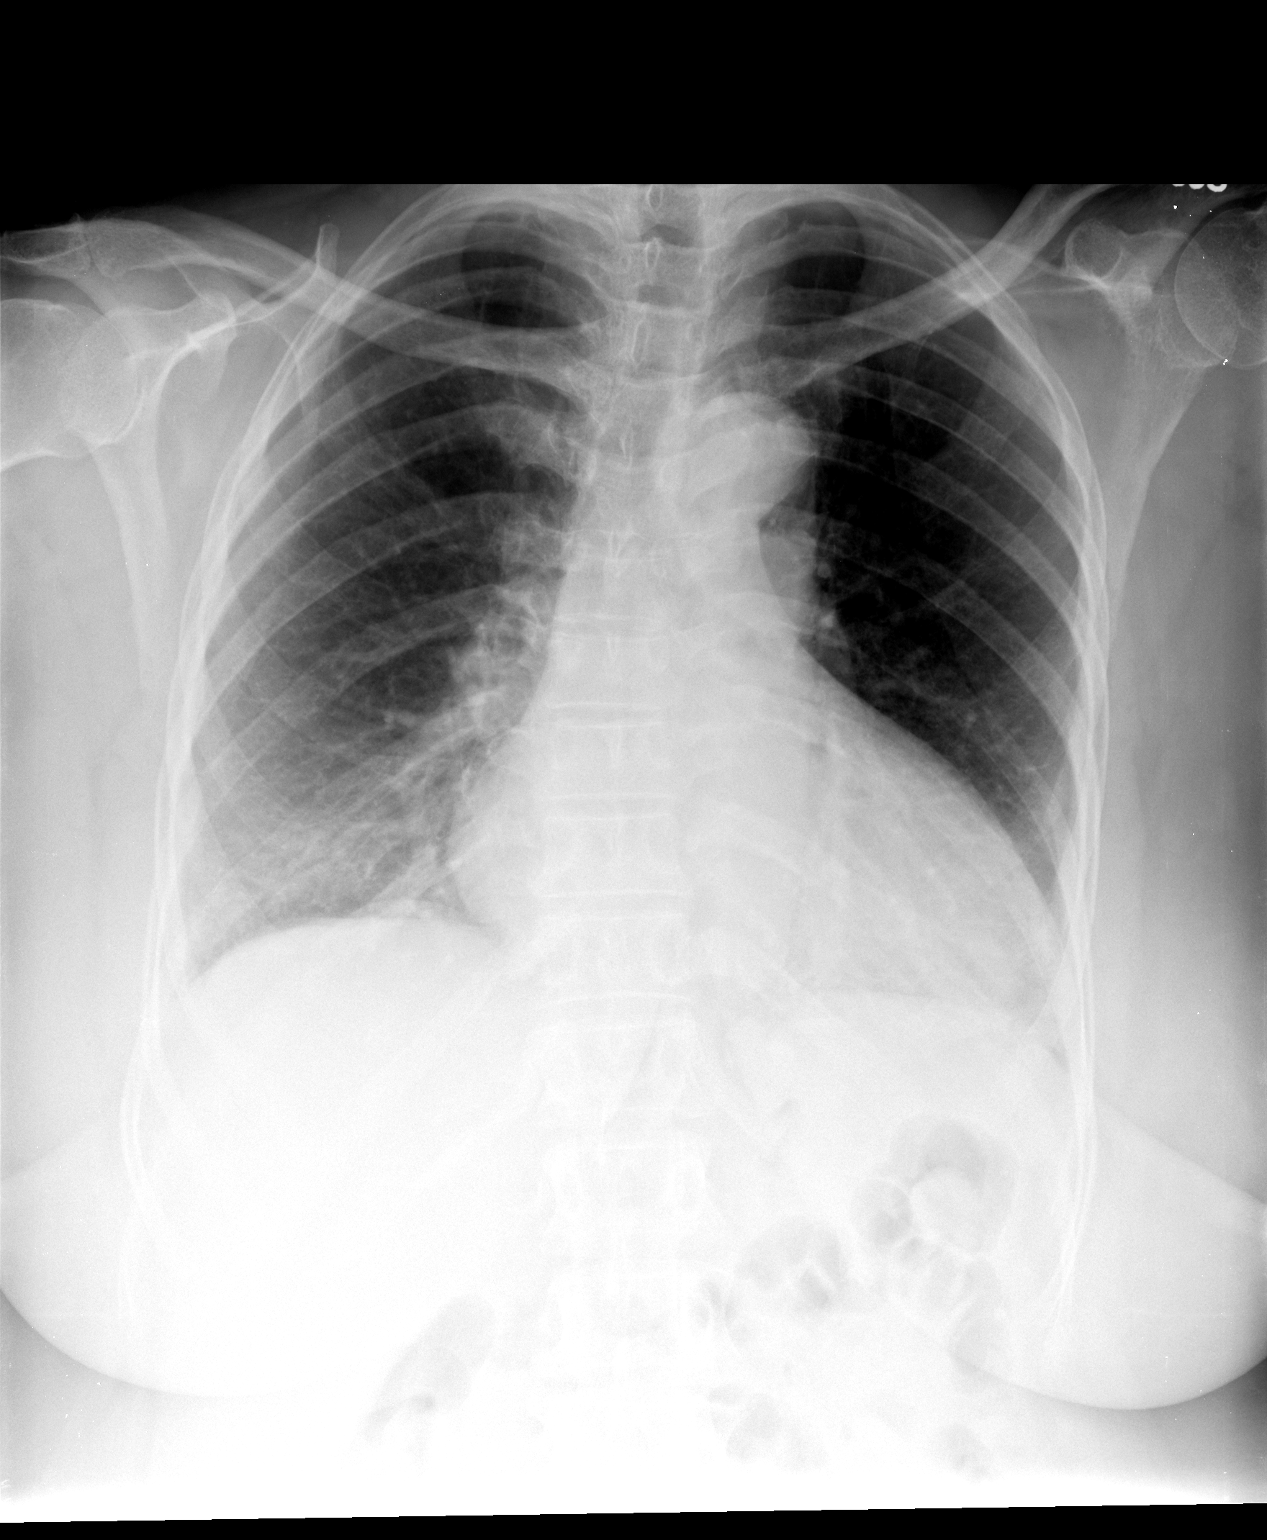

[view not recorded (2 of 2)]
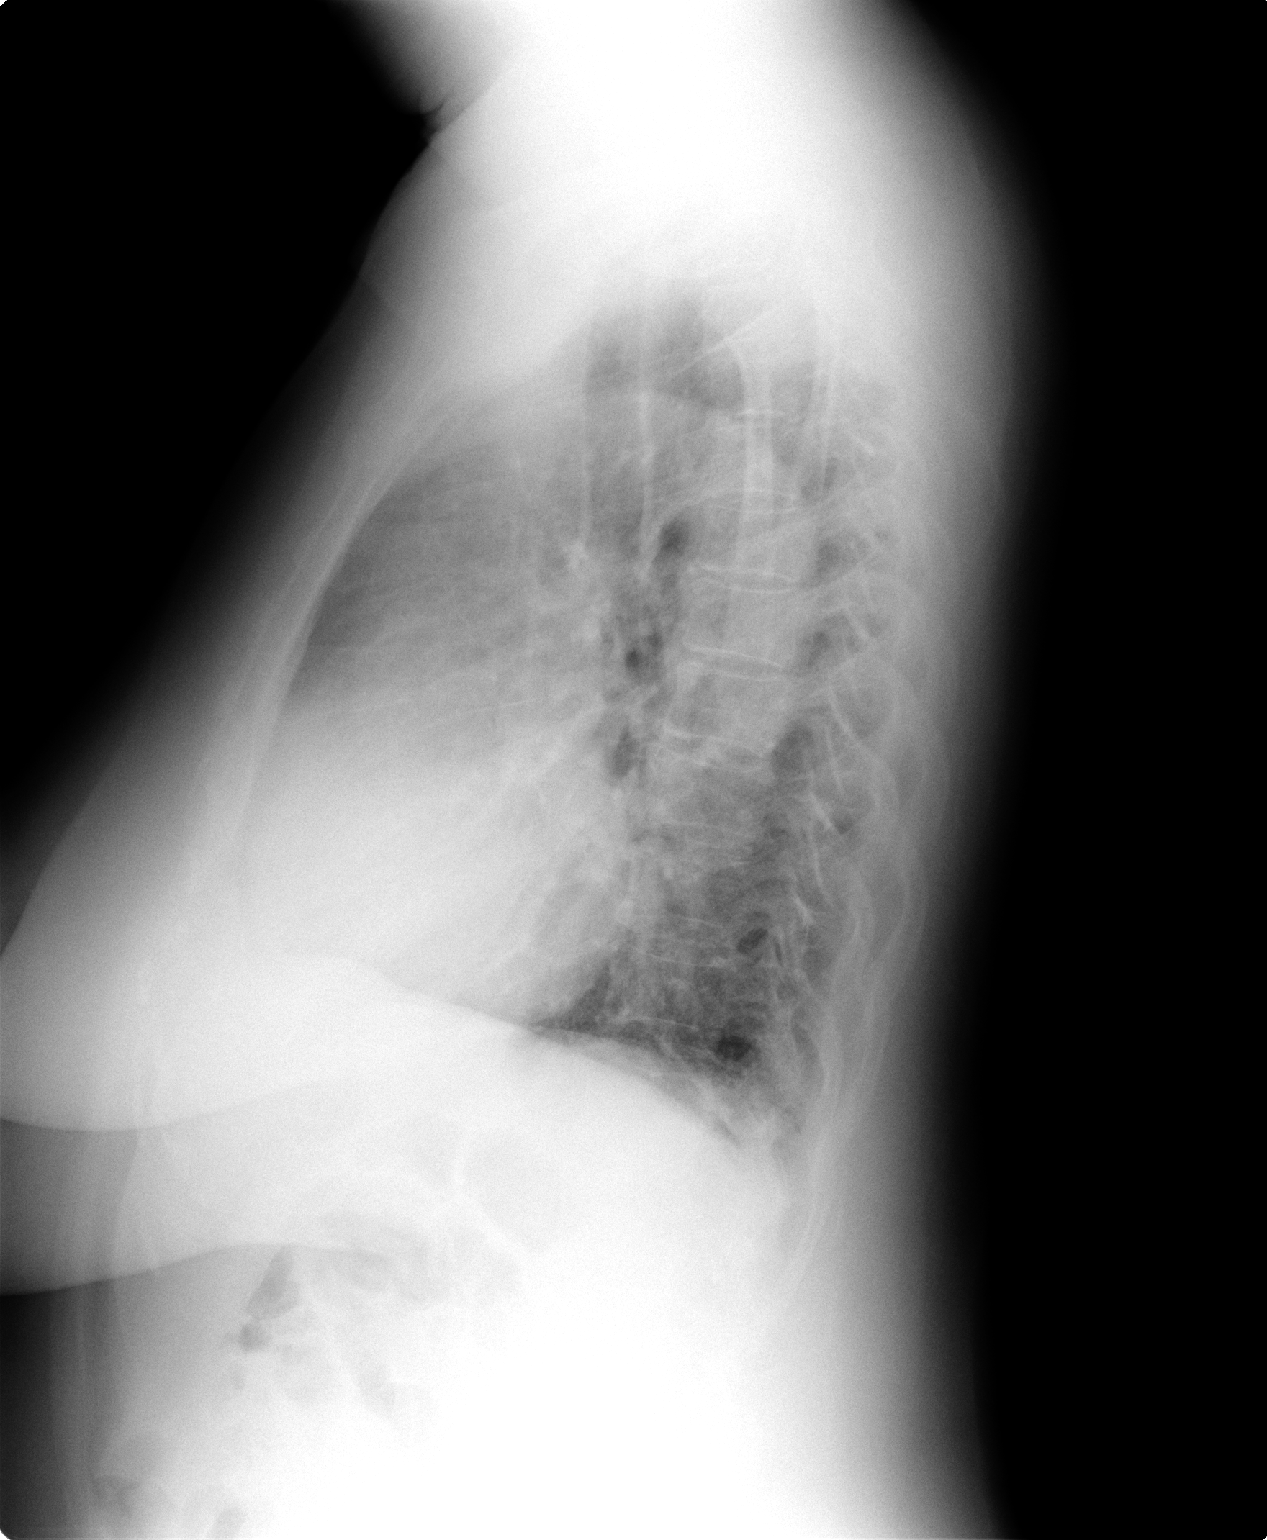

[2 of 2 positions shown; findings below may reference images not displayed]

FINDINGS: Mild cardiomegaly is noted.  No acute pulmonary disease
is noted.  No pleural effusion or pneumothorax is noted.  Bony
thorax is intact.
IMPRESSION: No acute cardiopulmonary abnormality seen.

## 2014-05-21 ENCOUNTER — Ambulatory Visit (INDEPENDENT_AMBULATORY_CARE_PROVIDER_SITE_OTHER): Payer: BC Managed Care – PPO

## 2014-05-21 DIAGNOSIS — J309 Allergic rhinitis, unspecified: Secondary | ICD-10-CM

## 2014-05-23 ENCOUNTER — Ambulatory Visit (INDEPENDENT_AMBULATORY_CARE_PROVIDER_SITE_OTHER): Payer: BC Managed Care – PPO

## 2014-05-23 DIAGNOSIS — J309 Allergic rhinitis, unspecified: Secondary | ICD-10-CM | POA: Diagnosis not present

## 2014-05-28 ENCOUNTER — Ambulatory Visit (INDEPENDENT_AMBULATORY_CARE_PROVIDER_SITE_OTHER): Payer: BC Managed Care – PPO

## 2014-05-28 DIAGNOSIS — J309 Allergic rhinitis, unspecified: Secondary | ICD-10-CM

## 2014-05-29 ENCOUNTER — Telehealth: Payer: Self-pay | Admitting: Internal Medicine

## 2014-05-29 ENCOUNTER — Other Ambulatory Visit: Payer: Self-pay | Admitting: Internal Medicine

## 2014-05-29 NOTE — Telephone Encounter (Signed)
Do you have any recs for the arm?  She wants to know if there is any cream/medication she should take to alleviate her symptoms now.  Thanks!

## 2014-05-29 NOTE — Telephone Encounter (Signed)
I called Catherine Hoffman to ask her if her arm was feverish, as we always do when a pt. Has a reaction. I gave her your recs. To put a cold compress on the sight. She had also asked in a a previous note about some cream for her arm. I suggested (per your recs in the past.) Benadryal or cortiaid cream.

## 2014-05-29 NOTE — Telephone Encounter (Signed)
I forgot to tell you I put a not on Catherine Hoffman's allergy record to take her back to 0.3 of 1:500 and stay there a couple of wks. And then build gradually.

## 2014-05-29 NOTE — Telephone Encounter (Signed)
This was new vial. Instructed allergy lab staff to reduce her next injection to 0.3 ml and rebuild as tolerated.  For the current reaction, she can take an antihistamine like allegra, zyrtec or claritin.

## 2014-05-29 NOTE — Telephone Encounter (Signed)
Pt states that she received her allergy vaccine Monday 05/28/2014 Pt states that her Left arm is red, hot to the touch and itchy - Pt believes she is having a reaction to the vaccine.  Pt denies any SOB or any difficulty breathing.  Please advise Dr Maple HudsonYoung. Thanks.   Allergies  Allergen Reactions  . Hydrocodone-Acetaminophen     REACTION: GI upset  . Tramadol Itching   Current Outpatient Prescriptions on File Prior to Visit  Medication Sig Dispense Refill  . acetaminophen (TYLENOL) 500 MG tablet Per bottle as needed for pain    . aspirin 81 MG tablet Take 81 mg by mouth daily.      . Azelastine-Fluticasone (DYMISTA) 137-50 MCG/ACT SUSP Place 1 puff into the nose 2 (two) times daily. 23 g 11  . calcium-vitamin D (OSCAL WITH D) 500-200 MG-UNIT per tablet Take 1 tablet by mouth 2 (two) times daily.      . cetirizine (ZYRTEC) 10 MG tablet Take in am daily as needed for drippy nose    . chlorpheniramine (CHLOR-TRIMETON) 4 MG tablet Take 4 mg by mouth at bedtime.    . docusate sodium (COLACE) 100 MG capsule 1-2 at bedtime as needed    . estradiol (ESTRACE) 0.5 MG tablet Take 0.5 mg by mouth daily.    . famotidine (PEPCID) 20 MG tablet Take 20 mg by mouth at bedtime.     . gabapentin (NEURONTIN) 100 MG capsule Take 100 mg by mouth as needed. One three times daily    . meclizine (ANTIVERT) 25 MG tablet Take 25 mg by mouth every 4 (four) hours as needed for dizziness.    . meloxicam (MOBIC) 7.5 MG tablet Take 7.5 mg by mouth 2 (two) times daily as needed.     . methocarbamol (ROBAXIN) 500 MG tablet Take 1 tablet (500 mg total) by mouth every 6 (six) hours as needed. 40 tablet 1  . omeprazole (PRILOSEC) 40 MG capsule TAKE 1 CAPSULE BY MOUTH 30 MINUTES BEFORE FIRST AND LAST MEAL OF THE DAY 180 capsule 1  . oxyCODONE-acetaminophen (PERCOCET/ROXICET) 5-325 MG per tablet Take 1 tablet by mouth every 4 (four) hours as needed. (this has tylenol in it) 60 tablet 0  . potassium chloride SA (K-DUR,KLOR-CON)  20 MEQ tablet TAKE 1 TABLET BY MOUTH ONE TIME DAILY 30 tablet 5  . promethazine (PHENERGAN) 25 MG tablet 1 every 4-6 hours as needed for nausea/vomiting    . spironolactone (ALDACTONE) 25 MG tablet TAKE 1 TABLET BY MOUTH TWICE DAILY 180 tablet 0  . traZODone (DESYREL) 50 MG tablet 1-2 at bedtime as needed for trouble sleeping     No current facility-administered medications on file prior to visit.

## 2014-05-29 NOTE — Telephone Encounter (Signed)
Per CY - lower dose to 0.483mL at next shot and build back up as tolerated.

## 2014-06-04 ENCOUNTER — Ambulatory Visit (INDEPENDENT_AMBULATORY_CARE_PROVIDER_SITE_OTHER): Payer: BC Managed Care – PPO

## 2014-06-04 DIAGNOSIS — J309 Allergic rhinitis, unspecified: Secondary | ICD-10-CM

## 2014-06-11 ENCOUNTER — Ambulatory Visit (INDEPENDENT_AMBULATORY_CARE_PROVIDER_SITE_OTHER): Payer: BC Managed Care – PPO

## 2014-06-11 DIAGNOSIS — J309 Allergic rhinitis, unspecified: Secondary | ICD-10-CM

## 2014-06-13 ENCOUNTER — Encounter: Payer: Self-pay | Admitting: Internal Medicine

## 2014-06-18 ENCOUNTER — Ambulatory Visit: Payer: BC Managed Care – PPO

## 2014-06-19 ENCOUNTER — Ambulatory Visit (INDEPENDENT_AMBULATORY_CARE_PROVIDER_SITE_OTHER): Payer: BC Managed Care – PPO

## 2014-06-19 DIAGNOSIS — J309 Allergic rhinitis, unspecified: Secondary | ICD-10-CM

## 2014-06-25 ENCOUNTER — Ambulatory Visit (INDEPENDENT_AMBULATORY_CARE_PROVIDER_SITE_OTHER): Payer: BC Managed Care – PPO

## 2014-06-25 DIAGNOSIS — J309 Allergic rhinitis, unspecified: Secondary | ICD-10-CM | POA: Diagnosis not present

## 2014-07-02 ENCOUNTER — Ambulatory Visit: Payer: BC Managed Care – PPO

## 2014-07-05 ENCOUNTER — Ambulatory Visit (INDEPENDENT_AMBULATORY_CARE_PROVIDER_SITE_OTHER): Payer: BC Managed Care – PPO

## 2014-07-05 DIAGNOSIS — J309 Allergic rhinitis, unspecified: Secondary | ICD-10-CM | POA: Diagnosis not present

## 2014-07-09 ENCOUNTER — Ambulatory Visit: Payer: BC Managed Care – PPO

## 2014-07-09 ENCOUNTER — Ambulatory Visit (INDEPENDENT_AMBULATORY_CARE_PROVIDER_SITE_OTHER): Payer: BC Managed Care – PPO

## 2014-07-09 DIAGNOSIS — J309 Allergic rhinitis, unspecified: Secondary | ICD-10-CM | POA: Diagnosis not present

## 2014-07-16 ENCOUNTER — Ambulatory Visit (INDEPENDENT_AMBULATORY_CARE_PROVIDER_SITE_OTHER): Payer: BC Managed Care – PPO

## 2014-07-16 ENCOUNTER — Telehealth: Payer: Self-pay | Admitting: Internal Medicine

## 2014-07-16 DIAGNOSIS — J309 Allergic rhinitis, unspecified: Secondary | ICD-10-CM

## 2014-07-16 MED ORDER — AZITHROMYCIN 250 MG PO TABS: 250.0000 mg | ORAL_TABLET | ORAL | Status: DC

## 2014-07-16 MED ORDER — PROMETHAZINE-CODEINE 6.25-10 MG/5ML PO SYRP: 5.0000 mL | ORAL_SOLUTION | Freq: Four times a day (QID) | ORAL | Status: DC | PRN

## 2014-07-16 NOTE — Telephone Encounter (Signed)
Spoke with patient. Aware of rec's per CY. Rx's sent to pharmacy- cough syrup called in.  Nothing further needed.

## 2014-07-16 NOTE — Telephone Encounter (Signed)
Pt states that she came in and got her allergy injection today- states that she explained her symptoms some to the nurse who gave her the injection.  Pt states that she wanted to call back and get rec's from Dr Maple HudsonYoung. Pt very hoarse on the phone, pt speaking in almost a whisper d/t voice being so strained. Pt c/o cough which is worse with talking, very sore throat and is having mucus production (not sure what color). Pt states that she woke up multiple times last night d/t increased mucus production.   Please advise Dr Maple HudsonYoung. Thanks.   Allergies  Allergen Reactions  . Hydrocodone-Acetaminophen     REACTION: GI upset  . Tramadol Itching     Medication List       This list is accurate as of: 07/16/14 11:40 AM.  Always use your most recent med list.               acetaminophen 500 MG tablet  Commonly known as:  TYLENOL  Per bottle as needed for pain     aspirin 81 MG tablet  Take 81 mg by mouth daily.     Azelastine-Fluticasone 137-50 MCG/ACT Susp  Commonly known as:  DYMISTA  Place 1 puff into the nose 2 (two) times daily.     calcium-vitamin D 500-200 MG-UNIT per tablet  Commonly known as:  OSCAL WITH D  Take 1 tablet by mouth 2 (two) times daily.     cetirizine 10 MG tablet  Commonly known as:  ZYRTEC  Take in am daily as needed for drippy nose     chlorpheniramine 4 MG tablet  Commonly known as:  CHLOR-TRIMETON  Take 4 mg by mouth at bedtime.     docusate sodium 100 MG capsule  Commonly known as:  COLACE  1-2 at bedtime as needed     estradiol 0.5 MG tablet  Commonly known as:  ESTRACE  Take 0.5 mg by mouth daily.     famotidine 20 MG tablet  Commonly known as:  PEPCID  Take 20 mg by mouth at bedtime.     gabapentin 100 MG capsule  Commonly known as:  NEURONTIN  Take 100 mg by mouth as needed. One three times daily     meclizine 25 MG tablet  Commonly known as:  ANTIVERT  Take 25 mg by mouth every 4 (four) hours as needed for dizziness.     meloxicam 7.5  MG tablet  Commonly known as:  MOBIC  Take 7.5 mg by mouth 2 (two) times daily as needed.     methocarbamol 500 MG tablet  Commonly known as:  ROBAXIN  Take 1 tablet (500 mg total) by mouth every 6 (six) hours as needed.     omeprazole 40 MG capsule  Commonly known as:  PRILOSEC  TAKE 1 CAPSULE BY MOUTH 30 MINUTES BEFORE FIRST AND LAST MEAL OF THE DAY     oxyCODONE-acetaminophen 5-325 MG per tablet  Commonly known as:  PERCOCET/ROXICET  Take 1 tablet by mouth every 4 (four) hours as needed. (this has tylenol in it)     potassium chloride SA 20 MEQ tablet  Commonly known as:  K-DUR,KLOR-CON  TAKE 1 TABLET BY MOUTH DAILY     promethazine 25 MG tablet  Commonly known as:  PHENERGAN  1 every 4-6 hours as needed for nausea/vomiting     spironolactone 25 MG tablet  Commonly known as:  ALDACTONE  TAKE 1 TABLET BY MOUTH TWICE DAILY  traZODone 50 MG tablet  Commonly known as:  DESYREL  1-2 at bedtime as needed for trouble sleeping

## 2014-07-16 NOTE — Telephone Encounter (Signed)
Offer Z pak           prometh codeine cough syrup, 200 ml,  Take 5 ml every 6 hours as needed for cough

## 2014-07-18 ENCOUNTER — Encounter: Payer: Self-pay | Admitting: Emergency Medicine

## 2014-07-18 ENCOUNTER — Telehealth: Payer: Self-pay | Admitting: Internal Medicine

## 2014-07-18 NOTE — Telephone Encounter (Signed)
Spoke with pt, states she has been out of work since Monday and wants to know if we can write her a note writing her out of work.  She is wanting to go back to work this upcoming Monday.  She had meds called in on Monday, see 07/16/14 office note.  Last ov: 10/17/13 Next ov: none  CY please advise if you are ok with writing her out of work this week.  Thank you.  Allergies  Allergen Reactions  . Hydrocodone-Acetaminophen     REACTION: GI upset  . Tramadol Itching   Current Outpatient Prescriptions on File Prior to Visit  Medication Sig Dispense Refill  . acetaminophen (TYLENOL) 500 MG tablet Per bottle as needed for pain    . aspirin 81 MG tablet Take 81 mg by mouth daily.      . Azelastine-Fluticasone (DYMISTA) 137-50 MCG/ACT SUSP Place 1 puff into the nose 2 (two) times daily. 23 g 11  . azithromycin (ZITHROMAX) 250 MG tablet Take 1 tablet (250 mg total) by mouth as directed. 6 tablet 0  . calcium-vitamin D (OSCAL WITH D) 500-200 MG-UNIT per tablet Take 1 tablet by mouth 2 (two) times daily.      . cetirizine (ZYRTEC) 10 MG tablet Take in am daily as needed for drippy nose    . chlorpheniramine (CHLOR-TRIMETON) 4 MG tablet Take 4 mg by mouth at bedtime.    . docusate sodium (COLACE) 100 MG capsule 1-2 at bedtime as needed    . estradiol (ESTRACE) 0.5 MG tablet Take 0.5 mg by mouth daily.    . famotidine (PEPCID) 20 MG tablet Take 20 mg by mouth at bedtime.     . gabapentin (NEURONTIN) 100 MG capsule Take 100 mg by mouth as needed. One three times daily    . meclizine (ANTIVERT) 25 MG tablet Take 25 mg by mouth every 4 (four) hours as needed for dizziness.    . meloxicam (MOBIC) 7.5 MG tablet Take 7.5 mg by mouth 2 (two) times daily as needed.     . methocarbamol (ROBAXIN) 500 MG tablet Take 1 tablet (500 mg total) by mouth every 6 (six) hours as needed. 40 tablet 1  . omeprazole (PRILOSEC) 40 MG capsule TAKE 1 CAPSULE BY MOUTH 30 MINUTES BEFORE FIRST AND LAST MEAL OF THE DAY 180 capsule 1   . oxyCODONE-acetaminophen (PERCOCET/ROXICET) 5-325 MG per tablet Take 1 tablet by mouth every 4 (four) hours as needed. (this has tylenol in it) 60 tablet 0  . potassium chloride SA (K-DUR,KLOR-CON) 20 MEQ tablet TAKE 1 TABLET BY MOUTH DAILY 30 tablet 2  . promethazine (PHENERGAN) 25 MG tablet 1 every 4-6 hours as needed for nausea/vomiting    . promethazine-codeine (PHENERGAN WITH CODEINE) 6.25-10 MG/5ML syrup Take 5 mLs by mouth every 6 (six) hours as needed for cough. 200 mL 0  . spironolactone (ALDACTONE) 25 MG tablet TAKE 1 TABLET BY MOUTH TWICE DAILY 180 tablet 0  . traZODone (DESYREL) 50 MG tablet 1-2 at bedtime as needed for trouble sleeping     No current facility-administered medications on file prior to visit.

## 2014-07-18 NOTE — Telephone Encounter (Signed)
Ok work note for the time span she requests.

## 2014-07-18 NOTE — Telephone Encounter (Signed)
Called and spoke to pt. Informed pt of the work note. Pt requested letter to be faxed to [phone] and once faxed she requested the letter to be mailed to her to keep on file. Letter faxed and placed in outgoing mail. Pt verbalized understanding and denied any further questions or concerns at this time.

## 2014-07-19 ENCOUNTER — Telehealth: Payer: Self-pay | Admitting: Internal Medicine

## 2014-07-19 NOTE — Telephone Encounter (Signed)
Called spoke with pt. Aware letter refaxed. Nothing further needed

## 2014-07-23 ENCOUNTER — Ambulatory Visit: Payer: BC Managed Care – PPO

## 2014-07-25 ENCOUNTER — Ambulatory Visit (INDEPENDENT_AMBULATORY_CARE_PROVIDER_SITE_OTHER): Payer: BC Managed Care – PPO

## 2014-07-25 DIAGNOSIS — J309 Allergic rhinitis, unspecified: Secondary | ICD-10-CM

## 2014-07-30 ENCOUNTER — Ambulatory Visit (INDEPENDENT_AMBULATORY_CARE_PROVIDER_SITE_OTHER): Payer: BC Managed Care – PPO

## 2014-07-30 DIAGNOSIS — J309 Allergic rhinitis, unspecified: Secondary | ICD-10-CM

## 2014-08-06 ENCOUNTER — Ambulatory Visit: Payer: BC Managed Care – PPO | Admitting: Internal Medicine

## 2014-08-06 ENCOUNTER — Ambulatory Visit (INDEPENDENT_AMBULATORY_CARE_PROVIDER_SITE_OTHER): Payer: BC Managed Care – PPO

## 2014-08-06 DIAGNOSIS — J309 Allergic rhinitis, unspecified: Secondary | ICD-10-CM | POA: Diagnosis not present

## 2014-08-10 ENCOUNTER — Ambulatory Visit (INDEPENDENT_AMBULATORY_CARE_PROVIDER_SITE_OTHER): Payer: BC Managed Care – PPO

## 2014-08-10 DIAGNOSIS — J309 Allergic rhinitis, unspecified: Secondary | ICD-10-CM

## 2014-08-13 ENCOUNTER — Ambulatory Visit (INDEPENDENT_AMBULATORY_CARE_PROVIDER_SITE_OTHER): Payer: BC Managed Care – PPO

## 2014-08-13 DIAGNOSIS — J309 Allergic rhinitis, unspecified: Secondary | ICD-10-CM

## 2014-08-17 ENCOUNTER — Encounter: Payer: Self-pay | Admitting: Internal Medicine

## 2014-08-17 ENCOUNTER — Ambulatory Visit (INDEPENDENT_AMBULATORY_CARE_PROVIDER_SITE_OTHER): Payer: BC Managed Care – PPO | Admitting: Internal Medicine

## 2014-08-17 ENCOUNTER — Other Ambulatory Visit (INDEPENDENT_AMBULATORY_CARE_PROVIDER_SITE_OTHER): Payer: BC Managed Care – PPO

## 2014-08-17 ENCOUNTER — Ambulatory Visit (INDEPENDENT_AMBULATORY_CARE_PROVIDER_SITE_OTHER)
Admission: RE | Admit: 2014-08-17 | Discharge: 2014-08-17 | Disposition: A | Discharge status: Home / Self Care | Payer: BC Managed Care – PPO | Source: Ambulatory Visit | Attending: Internal Medicine | Admitting: Internal Medicine

## 2014-08-17 VITALS — BP 124/80 | HR 70 | Ht 62.0 in | Wt 187.0 lb

## 2014-08-17 DIAGNOSIS — E7800 Pure hypercholesterolemia, unspecified: Secondary | ICD-10-CM

## 2014-08-17 DIAGNOSIS — J309 Allergic rhinitis, unspecified: Secondary | ICD-10-CM

## 2014-08-17 DIAGNOSIS — R202 Paresthesia of skin: Secondary | ICD-10-CM | POA: Diagnosis not present

## 2014-08-17 DIAGNOSIS — I10 Essential (primary) hypertension: Secondary | ICD-10-CM

## 2014-08-17 DIAGNOSIS — R059 Cough, unspecified: Secondary | ICD-10-CM

## 2014-08-17 DIAGNOSIS — R05 Cough: Secondary | ICD-10-CM

## 2014-08-17 DIAGNOSIS — Z23 Encounter for immunization: Secondary | ICD-10-CM

## 2014-08-17 LAB — CBC WITH DIFFERENTIAL/PLATELET
BASOS PCT: 0.4 % (ref 0.0–3.0)
Basophils Absolute: 0 10*3/uL (ref 0.0–0.1)
EOS PCT: 2.1 % (ref 0.0–5.0)
Eosinophils Absolute: 0.1 10*3/uL (ref 0.0–0.7)
HCT: 40.8 % (ref 36.0–46.0)
HEMOGLOBIN: 13.4 g/dL (ref 12.0–15.0)
LYMPHS ABS: 2.8 10*3/uL (ref 0.7–4.0)
Lymphocytes Relative: 42.6 % (ref 12.0–46.0)
MCHC: 32.8 g/dL (ref 30.0–36.0)
MCV: 89.3 fl (ref 78.0–100.0)
MONO ABS: 0.4 10*3/uL (ref 0.1–1.0)
Monocytes Relative: 6.4 % (ref 3.0–12.0)
NEUTROS ABS: 3.2 10*3/uL (ref 1.4–7.7)
Neutrophils Relative %: 48.5 % (ref 43.0–77.0)
Platelets: 203 10*3/uL (ref 150.0–400.0)
RBC: 4.57 Mil/uL (ref 3.87–5.11)
RDW: 13.7 % (ref 11.5–15.5)
WBC: 6.7 10*3/uL (ref 4.0–10.5)

## 2014-08-17 LAB — TSH: TSH: 1.4 u[IU]/mL (ref 0.35–4.50)

## 2014-08-17 LAB — HEPATIC FUNCTION PANEL
ALT: 12 U/L (ref 0–35)
AST: 17 U/L (ref 0–37)
Albumin: 4.4 g/dL (ref 3.5–5.2)
Alkaline Phosphatase: 102 U/L (ref 39–117)
BILIRUBIN DIRECT: 0.1 mg/dL (ref 0.0–0.3)
Total Bilirubin: 0.6 mg/dL (ref 0.2–1.2)
Total Protein: 7.6 g/dL (ref 6.0–8.3)

## 2014-08-17 LAB — BASIC METABOLIC PANEL
BUN: 16 mg/dL (ref 6–23)
CHLORIDE: 105 meq/L (ref 96–112)
CO2: 26 mEq/L (ref 19–32)
Calcium: 10.1 mg/dL (ref 8.4–10.5)
Creatinine, Ser: 1.01 mg/dL (ref 0.40–1.20)
GFR: 68.99 mL/min (ref >60.00)
Glucose, Bld: 94 mg/dL (ref 70–99)
Potassium: 4 mEq/L (ref 3.5–5.1)
Sodium: 138 mEq/L (ref 135–145)

## 2014-08-17 NOTE — Patient Instructions (Addendum)
Please remember to go to the lab and x-ray department downstairs for your tests - we will call you with the results when they are available.    See Tammy NP in 3 months with all your medications, even over the counter meds, separated in two separate bags, the ones you take no matter what vs the ones you stop once you feel better and take only as needed when you feel you need them.   Tammy  will generate for you a new user friendly medication calendar that will put us all on the same page re: your medication use.     Without this process, it simply isn't possible to assure that we are providing  your outpatient care  with  the attention to detail we feel you deserve.   If we cannot assure that you're getting that kind of care,  then we cannot manage your problem effectively from this clinic.  Once you have seen Tammy and we are sure that we're all on the same page with your medication use she will arrange follow up with me.  Late add :  Chart review indicates track record of not following up appropriately and ad libbing on her maint vs prns > will refer to IM and let her see back Dr Maple HudsonYoung for all pulmonary/allergy issues

## 2014-08-17 NOTE — Progress Notes (Signed)
Subjective:    Patient ID: Catherine Hoffman, female    DOB: 1941-05-03 .   MRN: [id number]  Brief patient profile:  [id number] yobf never smoker with hypertension, degenerative arthritis, and difficulty following through on instructions regarding medication reconciliation and weight loss.     History of Present Illness  06/29/2011 f/u ov/Annalea Alguire cc no  Cp, tia, claudication or limiting sob rec Cal balance reviewed for wt loss   02/07/13 allergy eval Dr Maple HudsonYoung rec Script sent to try Singulair/ montelukast- one daily for allergic nose and cough Sample Dymista nasal spray-  1-2 puffs each nostril once every day at bedtime. Try this instead of flonase/ fluticasone. When the sample runs out, go back to flonase Try otc allergy eyedrop [place]away for allergy in eyes Schedule allergy skin tests    07/2013 rx allergy shots    09/26/2013 f/u ov/Brodi Kari re: HBP, GERD, DJD, chronic rhinitis Chief Complaint  Patient presents with  . Annual Exam    Pt not fasting today.  She is still c/o diarrhea and cramping-seeing GI for this problem.    rec Return fasting for labs > never did it F/u with Tammy 3 months > not done    08/17/2014 f/u ov/Iliyana Convey re: hbp/ obesity/ djd/ gerd/ chronic rhinits/ now concerned she has dm due to numbness in toes  Chief Complaint  Patient presents with  . Acute Visit    Pt c/o pain in her toes- "pins and needles pain" for the past 2 months.  She had some chest discomfort and DOE a few wks ago- relates to working too much and is better now that she has rested.    numbness comes and goes avg once to three times a week/ lasts 5-10 min and resolves / only in big toes and never both a same time   Not limited by breathing from desired activities  - no tia, claudication, exertional cp.  No obvious day to day or daytime variabilty or assoc chronic cough or cp or chest tightness, subjective wheeze overt sinus or hb symptoms. No unusual exp hx or h/o childhood pna/ asthma or knowledge of premature  birth.  Sleeping ok without nocturnal  or early am exacerbation  of respiratory  c/o's or need for noct saba. Also denies any obvious fluctuation of symptoms with weather or environmental changes or other aggravating or alleviating factors except as outlined above   Current Medications, Allergies, Complete Past Medical History, Past Surgical History, Family History, and Social History were reviewed in [facility] electronic medical record.  ROS  The following are not active complaints unless bolded sore throat, dysphagia, dental problems, itching, sneezing,  nasal congestion or excess/ purulent secretions, ear ache,   fever, chills, sweats, unintended wt loss, pleuritic or exertional cp, hemoptysis,  orthopnea pnd or leg swelling, presyncope, palpitations, heartburn, abdominal pain, anorexia, nausea, vomiting, diarrhea  or change in bowel or urinary habits, change in stools or urine, dysuria,hematuria,  rash, arthralgias, visual complaints, headache, numbness weakness or ataxia or problems with walking or coordination,  change in mood/affect or memory.             Past Medical History:  GERD(ICD-530.81)..............................................................................Marland Kitchen.Russella DarStark  - EGD 04/28/2001 esophagitis with HH  Chronic constipation  Internal Hemorrhoids  - Colonoscopy 5/ 2006  - Colonoscopy ok except hem 11/20/08  MORBID OBESITY (ICD-278.01)  - Target wt = 158 for BMI < 30  EPISODIC TENSION TYPE HEADACHE (ICD-339.11)  Hypertension  Degenerative arthritis  - Arthroscopy Left knee, Dr Barry Dieneswens  Radicular  back/ L leg pain 08/13/2011 > referred to [facility] > Gioffre rec mri ? done Herpes zoster-1998 with no significant residual R T 12  Hyperlipidemia  - Targe LDL < 160 (HBP only known RF)  Health Maintenance................................................................................Marland KitchenWert   - Pneumovax February 25, 2009, prevnar 13 08/17/2014  - Td February 25, 2009  - CPX   08/17/2014  - GYN Senaida Ores -med calendar 12/21/2011 ,  12/29/2012 , 05/24/2013, 05/23/13 > did not bring with her 08/17/14   Family History:  positive for hypertension and diabetes in her mother  cancer of the leg in her mother does not know the type  She has no full siblings does not know her father's history  no gallbladder disease to her knowledge  No FH of Colon Cancer:   Social History:  she denies ever smoking  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        Objective:   Physical Exam  08/17/2014        187 Wt Readings from Last 3 Encounters:  09/26/13 185 lb 3.2 oz (84.006 kg)  09/05/13 183 lb (83.008 kg)  08/16/13 186 lb 6 oz (84.539 kg)         GENERAL:  Hoarse amb wf    HEENT: Mouth:  Top dentures  EACs-clear, TMs-wnl, NOSE-mod nonspecific Turbinate edema, THROAT-clear & wnl.  NECK: Supple w/ fair ROM; no JVD; normal carotid impulses w/o bruits; no thyromegaly or nodules palpated; no lymphadenopathy.  CHEST: Clear to P & A; w/o, wheezes/ rales/ or rhonchi.  HEART: RRR, no m/r/g heard. pedal pulses sym reduced bilaterally  ABDOMEN: Soft & nt; nml bowel sounds; no organomegaly or masses detected.  EXT: Warm bilat, no calf pain, edema, clubbing, pulses intact MS nl gait,  No deformities  Skin No rash.  Neuro: no decrease sensation/ nl 2 point, nl reflexs    CXR   08/17/14 Ordered/ did not go    Labs ordered/ reviewed     Recent Labs Lab 08/17/14 1712  NA 138  K 4.0  CL 105  CO2 26  BUN 16  CREATININE 1.01  GLUCOSE 94    Recent Labs Lab 08/17/14 1712  HGB 13.4  HCT 40.8  WBC 6.7  PLT 203.0     Lab Results  Component Value Date   TSH 1.40 08/17/2014          Assessment & Plan:

## 2014-08-18 ENCOUNTER — Encounter: Payer: Self-pay | Admitting: Internal Medicine

## 2014-08-18 DIAGNOSIS — R202 Paresthesia of skin: Secondary | ICD-10-CM | POA: Insufficient documentation

## 2014-08-18 NOTE — Assessment & Plan Note (Signed)
Adequate control on present rx, reviewed > no change in rx needed   

## 2014-08-18 NOTE — Assessment & Plan Note (Signed)
-   onset March 2014 p ET - Sinus ct  03/31/2013  > No evidence of active sinus disease. Little change seen from the prior study. - Neurontin 03/28/2013 > improved 04/12/13  > pt d/c ? When    I had an extended discussion with the patient reviewing all relevant studies completed to date and  lasting 15 to 20 minutes of a 25 minute visit on the following ongoing concerns:    Pt really not following up as rec for primary care through this office and her allergy/cough needs are being addressed by allergy/ Dr Maple HudsonYoung  Will refer her to internal medicine for f/u hbp/hyperlipidemia

## 2014-08-18 NOTE — Assessment & Plan Note (Signed)
-   Allergy profile 12/13/12 > IgE over 800 > referred to Dr Maple HudsonYoung  > plan for allergy testing Jan 2014  - Trial of singulair > d/c 03/28/2013 as no better  Allergy Skin test 06/14/13- Strong positive puncture reactions, no I.D tests. Given allegra Start allergy vaccine 07/20/13  Seems better, continue rx as per Dr Maple HudsonYoung

## 2014-08-18 NOTE — Assessment & Plan Note (Signed)
Although symmetric suggesting a metabolic cause it is transient/ fleeting and never in both toes at once. No evidence dm/ no etoh hx/ f/u neuro prn

## 2014-08-18 NOTE — Assessment & Plan Note (Signed)
Lab Results  Component Value Date   CHOL 212* 05/30/2012   HDL 54.70 05/30/2012   LDLDIRECT 150.2 05/30/2012   TRIG 46.0 05/30/2012   CHOLHDL 4 05/30/2012    Lab Results  Component Value Date   TSH 1.40 08/17/2014     Needs repeat fasting next ov and work ok wt loss in meantime

## 2014-08-20 ENCOUNTER — Ambulatory Visit (INDEPENDENT_AMBULATORY_CARE_PROVIDER_SITE_OTHER): Payer: BC Managed Care – PPO

## 2014-08-20 ENCOUNTER — Telehealth: Payer: Self-pay | Admitting: *Deleted

## 2014-08-20 DIAGNOSIS — I10 Essential (primary) hypertension: Secondary | ICD-10-CM

## 2014-08-20 DIAGNOSIS — J309 Allergic rhinitis, unspecified: Secondary | ICD-10-CM | POA: Diagnosis not present

## 2014-08-20 NOTE — Progress Notes (Signed)
Quick Note:  Spoke with pt and notified of results per Dr. Wert. Pt verbalized understanding and denied any questions.  ______ 

## 2014-08-20 NOTE — Telephone Encounter (Signed)
Pt returned call  [phone]

## 2014-08-20 NOTE — Progress Notes (Signed)
Quick Note:  LMTCB ______ 

## 2014-08-20 NOTE — Telephone Encounter (Signed)
-----   Message from Nyoka CowdenMichael B Wert, MD sent at 08/18/2014  4:23 PM EST ----- will refer to IM and let her see back Dr Maple HudsonYoung for all pulmonary/allergy issues as there's no reason see me and Dr young and Dr young is doing a good job on her resp problems but she really needs an IM doctor at this point  She can still see Tammy in 3 m if not yet established with IM but when comes back needs to be fasting and get a cxr and lipid profile

## 2014-08-20 NOTE — Telephone Encounter (Signed)
Pt returning call.Catherine Hoffman ° °

## 2014-08-20 NOTE — Telephone Encounter (Signed)
Left message for patient to return call.

## 2014-08-20 NOTE — Telephone Encounter (Signed)
Pt aware of IM referral.  Nothing further needed.

## 2014-08-20 NOTE — Telephone Encounter (Signed)
I spoke with the pt about labs/cxr results She verbalized understanding I also advised her of the below recs per MW regarding IM referral She stated that the phone was breaking up and she would have to call me back  Will await her call  I have already placed IM ref

## 2014-08-20 NOTE — Telephone Encounter (Signed)
LMTCB

## 2014-08-24 ENCOUNTER — Telehealth: Payer: Self-pay | Admitting: Internal Medicine

## 2014-08-24 DIAGNOSIS — N200 Calculus of kidney: Secondary | ICD-10-CM | POA: Diagnosis not present

## 2014-08-24 NOTE — Telephone Encounter (Signed)
disreguard message this was from libby.Caren GriffinsStanley A Dalton

## 2014-08-27 ENCOUNTER — Ambulatory Visit (INDEPENDENT_AMBULATORY_CARE_PROVIDER_SITE_OTHER): Payer: BC Managed Care – PPO

## 2014-08-27 DIAGNOSIS — J309 Allergic rhinitis, unspecified: Secondary | ICD-10-CM

## 2014-08-27 DIAGNOSIS — N200 Calculus of kidney: Secondary | ICD-10-CM | POA: Diagnosis not present

## 2014-09-03 ENCOUNTER — Ambulatory Visit (INDEPENDENT_AMBULATORY_CARE_PROVIDER_SITE_OTHER): Payer: BC Managed Care – PPO

## 2014-09-03 DIAGNOSIS — J309 Allergic rhinitis, unspecified: Secondary | ICD-10-CM

## 2014-09-10 ENCOUNTER — Ambulatory Visit (INDEPENDENT_AMBULATORY_CARE_PROVIDER_SITE_OTHER): Payer: BC Managed Care – PPO

## 2014-09-10 DIAGNOSIS — J309 Allergic rhinitis, unspecified: Secondary | ICD-10-CM

## 2014-09-13 ENCOUNTER — Other Ambulatory Visit: Payer: Self-pay | Admitting: Internal Medicine

## 2014-09-14 NOTE — Telephone Encounter (Signed)
Please advise on refill. Thanks. 

## 2014-09-17 ENCOUNTER — Ambulatory Visit (INDEPENDENT_AMBULATORY_CARE_PROVIDER_SITE_OTHER): Payer: BC Managed Care – PPO

## 2014-09-17 DIAGNOSIS — J309 Allergic rhinitis, unspecified: Secondary | ICD-10-CM | POA: Diagnosis not present

## 2014-09-17 NOTE — Telephone Encounter (Signed)
Ok to refill if not done

## 2014-09-17 NOTE — Telephone Encounter (Signed)
Pt. Needs a new rx for Fluticasone Propionate 50 mcg. The original rx was written by Dr. Frederich BaldingWert,it exp. she now sees Dr.Young. Katie, please send or call this in [facility]  [address] [phone].  Thanks, [employer]my Scott

## 2014-09-24 ENCOUNTER — Telehealth: Payer: Self-pay | Admitting: Internal Medicine

## 2014-09-24 ENCOUNTER — Ambulatory Visit (INDEPENDENT_AMBULATORY_CARE_PROVIDER_SITE_OTHER): Payer: BC Managed Care – PPO

## 2014-09-24 DIAGNOSIS — J309 Allergic rhinitis, unspecified: Secondary | ICD-10-CM | POA: Diagnosis not present

## 2014-09-24 DIAGNOSIS — R059 Cough, unspecified: Secondary | ICD-10-CM

## 2014-09-24 DIAGNOSIS — R05 Cough: Secondary | ICD-10-CM

## 2014-09-24 NOTE — Telephone Encounter (Signed)
Attempted to contact pt. Line was busy. Will try back later. 

## 2014-09-25 MED ORDER — AZELASTINE-FLUTICASONE 137-50 MCG/ACT NA SUSP: 1.0000 | Freq: Two times a day (BID) | NASAL | Status: DC

## 2014-09-25 NOTE — Telephone Encounter (Signed)
lmtcb X1 for pt  

## 2014-09-25 NOTE — Telephone Encounter (Signed)
Pt needs refill on dymista.  This has been sent.  Nothing further needed.

## 2014-09-27 ENCOUNTER — Ambulatory Visit (INDEPENDENT_AMBULATORY_CARE_PROVIDER_SITE_OTHER): Payer: BC Managed Care – PPO

## 2014-09-27 DIAGNOSIS — J309 Allergic rhinitis, unspecified: Secondary | ICD-10-CM

## 2014-10-01 ENCOUNTER — Encounter: Payer: Self-pay | Admitting: Internal Medicine

## 2014-10-01 ENCOUNTER — Ambulatory Visit (INDEPENDENT_AMBULATORY_CARE_PROVIDER_SITE_OTHER): Payer: BC Managed Care – PPO

## 2014-10-01 ENCOUNTER — Telehealth: Payer: Self-pay | Admitting: Internal Medicine

## 2014-10-01 DIAGNOSIS — J309 Allergic rhinitis, unspecified: Secondary | ICD-10-CM | POA: Diagnosis not present

## 2014-10-01 MED ORDER — FLUTICASONE PROPIONATE 50 MCG/ACT NA SUSP: 1.0000 | Freq: Two times a day (BID) | NASAL | Status: DC

## 2014-10-01 MED ORDER — AZELASTINE HCL 0.1 % NA SOLN: 1.0000 | Freq: Two times a day (BID) | NASAL | Status: DC

## 2014-10-01 NOTE — Telephone Encounter (Signed)
Both prescriptions have been sent in. Pt is aware. Nothing further was needed. 

## 2014-10-01 NOTE — Telephone Encounter (Signed)
Ok to split Dymista into scripts for Flonase 1-2 puffs each nostril once daily, and Astelin with same instruction, ref prn

## 2014-10-01 NOTE — Telephone Encounter (Signed)
Spoke with pt. States that her insurance company will not cover Dymista.  CY - can we send in the medications separately? Thanks.

## 2014-10-02 ENCOUNTER — Encounter: Payer: Self-pay | Admitting: Gastroenterology

## 2014-10-08 ENCOUNTER — Ambulatory Visit (INDEPENDENT_AMBULATORY_CARE_PROVIDER_SITE_OTHER): Payer: BC Managed Care – PPO

## 2014-10-08 ENCOUNTER — Telehealth: Payer: Self-pay | Admitting: Internal Medicine

## 2014-10-08 DIAGNOSIS — J309 Allergic rhinitis, unspecified: Secondary | ICD-10-CM | POA: Diagnosis not present

## 2014-10-08 NOTE — Telephone Encounter (Signed)
We slowly and sucessfully built Catherine Hoffman back up to 1:500 at 0.8. Last wk. I gave her 0.1 of 1:50,the site was red,raised,feverish and itchy. Reaction size: softball. I still had some of her 1:500 left. So I gave her 0.5 of it since she had such a reaction. My question is should this be her tolerated dose? This is the second time we've built her up at 0.5 of 1:500.

## 2014-10-09 NOTE — Telephone Encounter (Signed)
Noted  

## 2014-10-09 NOTE — Telephone Encounter (Signed)
Sounds as if we should stay at 1:500 for now

## 2014-10-15 ENCOUNTER — Ambulatory Visit (INDEPENDENT_AMBULATORY_CARE_PROVIDER_SITE_OTHER): Payer: BC Managed Care – PPO

## 2014-10-15 DIAGNOSIS — J309 Allergic rhinitis, unspecified: Secondary | ICD-10-CM

## 2014-10-22 ENCOUNTER — Ambulatory Visit (INDEPENDENT_AMBULATORY_CARE_PROVIDER_SITE_OTHER): Payer: BC Managed Care – PPO

## 2014-10-22 DIAGNOSIS — J309 Allergic rhinitis, unspecified: Secondary | ICD-10-CM

## 2014-10-29 ENCOUNTER — Ambulatory Visit (INDEPENDENT_AMBULATORY_CARE_PROVIDER_SITE_OTHER): Payer: BC Managed Care – PPO

## 2014-10-29 DIAGNOSIS — J309 Allergic rhinitis, unspecified: Secondary | ICD-10-CM

## 2014-11-06 ENCOUNTER — Ambulatory Visit (INDEPENDENT_AMBULATORY_CARE_PROVIDER_SITE_OTHER): Payer: BC Managed Care – PPO

## 2014-11-06 DIAGNOSIS — J309 Allergic rhinitis, unspecified: Secondary | ICD-10-CM | POA: Diagnosis not present

## 2014-11-12 ENCOUNTER — Ambulatory Visit (INDEPENDENT_AMBULATORY_CARE_PROVIDER_SITE_OTHER): Payer: BC Managed Care – PPO

## 2014-11-12 DIAGNOSIS — J309 Allergic rhinitis, unspecified: Secondary | ICD-10-CM | POA: Diagnosis not present

## 2014-11-19 ENCOUNTER — Ambulatory Visit (INDEPENDENT_AMBULATORY_CARE_PROVIDER_SITE_OTHER): Payer: BC Managed Care – PPO

## 2014-11-19 DIAGNOSIS — J309 Allergic rhinitis, unspecified: Secondary | ICD-10-CM | POA: Diagnosis not present

## 2014-11-20 DIAGNOSIS — H401213 Low-tension glaucoma, right eye, severe stage: Secondary | ICD-10-CM | POA: Diagnosis not present

## 2014-11-20 DIAGNOSIS — H401221 Low-tension glaucoma, left eye, mild stage: Secondary | ICD-10-CM | POA: Diagnosis not present

## 2014-11-20 DIAGNOSIS — H25813 Combined forms of age-related cataract, bilateral: Secondary | ICD-10-CM | POA: Diagnosis not present

## 2014-11-22 ENCOUNTER — Encounter: Payer: Self-pay | Admitting: Internal Medicine

## 2014-11-22 ENCOUNTER — Ambulatory Visit (INDEPENDENT_AMBULATORY_CARE_PROVIDER_SITE_OTHER): Payer: BC Managed Care – PPO | Admitting: Internal Medicine

## 2014-11-22 VITALS — BP 122/80 | HR 65 | Temp 97.9°F | Ht 62.0 in | Wt 190.2 lb

## 2014-11-22 DIAGNOSIS — E669 Obesity, unspecified: Secondary | ICD-10-CM

## 2014-11-22 DIAGNOSIS — K219 Gastro-esophageal reflux disease without esophagitis: Secondary | ICD-10-CM

## 2014-11-22 DIAGNOSIS — M545 Low back pain, unspecified: Secondary | ICD-10-CM

## 2014-11-22 DIAGNOSIS — Z79899 Other long term (current) drug therapy: Secondary | ICD-10-CM | POA: Diagnosis not present

## 2014-11-22 DIAGNOSIS — I10 Essential (primary) hypertension: Secondary | ICD-10-CM | POA: Diagnosis not present

## 2014-11-22 NOTE — Progress Notes (Signed)
Pre visit review using our clinic review tool, if applicable. No additional management support is needed unless otherwise documented below in the visit note. 

## 2014-11-22 NOTE — Patient Instructions (Signed)
We will not change your medicines today.   Keep your appointment upstairs Monday.   Come back to see [place] in about 6 months.   Exercise to Stay Healthy Exercise helps you become and stay healthy. EXERCISE IDEAS AND TIPS Choose exercises that:  You enjoy.  Fit into your day. You do not need to exercise really hard to be healthy. You can do exercises at a slow or medium level and stay healthy. You can:  Stretch before and after working out.  Try yoga, Pilates, or tai chi.  Lift weights.  Walk fast, swim, jog, run, climb stairs, bicycle, dance, or rollerskate.  Take aerobic classes. Exercises that burn about 150 calories:  Running 1  miles in 15 minutes.  Playing volleyball for 45 to 60 minutes.  Washing and waxing a car for 45 to 60 minutes.  Playing touch football for 45 minutes.  Walking 1  miles in 35 minutes.  Pushing a stroller 1  miles in 30 minutes.  Playing basketball for 30 minutes.  Raking leaves for 30 minutes.  Bicycling 5 miles in 30 minutes.  Walking 2 miles in 30 minutes.  Dancing for 30 minutes.  Shoveling snow for 15 minutes.  Swimming laps for 20 minutes.  Walking up stairs for 15 minutes.  Bicycling 4 miles in 15 minutes.  Gardening for 30 to 45 minutes.  Jumping rope for 15 minutes.  Washing windows or floors for 45 to 60 minutes. Document Released: 06/27/2010 Document Revised: 08/17/2011 Document Reviewed: 06/27/2010 [facility] Patient Information 2015 [place], [place]. This information is not intended to replace advice given to you by your health care provider. Make sure you discuss any questions you have with your health care provider.

## 2014-11-24 DIAGNOSIS — Z79899 Other long term (current) drug therapy: Secondary | ICD-10-CM | POA: Insufficient documentation

## 2014-11-24 NOTE — Assessment & Plan Note (Signed)
Sees orthopedics for her pain and has opiates and muscle relaxers and NSAIDs for the pain. Would like to consolidate but she claims to take some only in certain situations. Will watch and adjust as able. Talked to her about exercise as a good way to relieve pain and work on her weight which likely contributes to her pain.

## 2014-11-24 NOTE — Progress Notes (Signed)
   Subjective:    Patient ID: Catherine Hoffman, female    DOB: [birth year], 74 y.o.   MRN: [id number]  HPI The patient is a 74 YO female who is coming in new for her low back pain. She does take many medications and insists that most of them she only takes as needed. She has quite a few sedating medications on her list. She has the low back pain and takes some of the muscle relaxers for the pain. She does not exercise and has not tried physical therapy. She has increased some in weight in the last few years and noticed that she is having more joint pains over that time. She has mild hypertension which is controlled and some coughing and breathing problems for which she sees her lung doctor.   PMH, [facility], social history reviewed and updated.   Review of Systems  Constitutional: Positive for activity change and fatigue. Negative for fever, appetite change and unexpected weight change.       Not exercising  HENT: Positive for congestion and postnasal drip. Negative for rhinorrhea, sinus pressure, sore throat and trouble swallowing.   Eyes: Negative.   Respiratory: Positive for cough. Negative for chest tightness, shortness of breath and wheezing.   Cardiovascular: Negative for chest pain, palpitations and leg swelling.  Gastrointestinal: Negative for nausea, abdominal pain, diarrhea, constipation and abdominal distention.  Musculoskeletal: Positive for back pain and arthralgias. Negative for gait problem.  Skin: Negative.   Neurological: Negative for dizziness, syncope, weakness, light-headedness and numbness.  Psychiatric/Behavioral: Negative.       Objective:   Physical Exam  Constitutional: She is oriented to person, place, and time. She appears well-developed and well-nourished.  OVerweight  HENT:  Head: Normocephalic and atraumatic.  Eyes: EOM are normal.  Neck: Normal range of motion.  Cardiovascular: Normal rate and regular rhythm.   Pulmonary/Chest: Effort normal. No respiratory  distress. She has no wheezes. She has no rales.  Abdominal: Soft. Bowel sounds are normal. She exhibits no distension. There is no tenderness.  Musculoskeletal: She exhibits no edema.  Neurological: She is alert and oriented to person, place, and time. Coordination normal.  Skin: Skin is warm and dry.  Psychiatric: She has a normal mood and affect.    Filed Vitals:   11/22/14 0907  BP: 122/80  Pulse: 65  Temp: 97.9 F (36.6 C)  TempSrc: Oral  Height: 5\' 2"  (1.575 m)  Weight: 190 lb 4 oz (86.297 kg)  SpO2: 95%      Assessment & Plan:

## 2014-11-24 NOTE — Assessment & Plan Note (Signed)
Has an extensive medication list and she claims that some of these medicines she does not take daily. Will work to reduce her list as able. Taking off inactive medications and will try to encourage her not to use meclizine as this can cause dizziness and is high risk in the elderly.

## 2014-11-24 NOTE — Assessment & Plan Note (Signed)
Currently controlled on spironolactone. Okay with that and recent BMP without problems or complications. BP at goal.

## 2014-11-24 NOTE — Assessment & Plan Note (Signed)
Takes pepcid which is not doing well. Will change to protonix to see if she has relief from symptoms.

## 2014-11-24 NOTE — Assessment & Plan Note (Signed)
Has not done well with weight reduction and talked to her about the need to balance diet and exercise and that she needs to find some kind of exercise she can do. Talked about how water aerobics may help with her pain and give her exercise.

## 2014-11-26 ENCOUNTER — Ambulatory Visit (INDEPENDENT_AMBULATORY_CARE_PROVIDER_SITE_OTHER): Payer: BC Managed Care – PPO

## 2014-11-26 ENCOUNTER — Ambulatory Visit (INDEPENDENT_AMBULATORY_CARE_PROVIDER_SITE_OTHER): Payer: BC Managed Care – PPO | Admitting: Adult Health

## 2014-11-26 ENCOUNTER — Encounter: Payer: Self-pay | Admitting: Adult Health

## 2014-11-26 VITALS — BP 118/68 | HR 65 | Temp 97.8°F | Ht 62.0 in | Wt 188.0 lb

## 2014-11-26 DIAGNOSIS — K219 Gastro-esophageal reflux disease without esophagitis: Secondary | ICD-10-CM

## 2014-11-26 DIAGNOSIS — I10 Essential (primary) hypertension: Secondary | ICD-10-CM

## 2014-11-26 DIAGNOSIS — J309 Allergic rhinitis, unspecified: Secondary | ICD-10-CM | POA: Diagnosis not present

## 2014-11-26 MED ORDER — MELOXICAM 7.5 MG PO TABS: 7.5000 mg | ORAL_TABLET | Freq: Two times a day (BID) | ORAL | Status: DC | PRN

## 2014-11-26 MED ORDER — MECLIZINE HCL 25 MG PO TABS: 25.0000 mg | ORAL_TABLET | Freq: Three times a day (TID) | ORAL | Status: DC | PRN

## 2014-11-26 NOTE — Assessment & Plan Note (Signed)
Restart nasal sprays with astelin and flonase  follow up with Dr. Maple Hudson  In 3 month

## 2014-11-26 NOTE — Patient Instructions (Signed)
Follow med calendar closely and bring to each visit.  Follow up with Dr. Maple Hudson in 3 months and As needed   Follow up with Dr. Dorise Hiss for primary care in future .  Please contact office for sooner follow up if symptoms do not improve or worsen or seek emergency care

## 2014-11-26 NOTE — Progress Notes (Signed)
Subjective:    Patient ID: Catherine Hoffman, female    DOB: 04-17-1941 .   MRN: [id number]  Brief patient profile:  68 yobf never smoker with hypertension, degenerative arthritis, and difficulty following through on instructions regarding medication reconciliation and weight loss.     History of Present Illness  06/29/2011 f/u ov/Wert cc no  Cp, tia, claudication or limiting sob rec Cal balance reviewed for wt loss   02/07/13 allergy eval Dr Maple Hudson rec Script sent to try Singulair/ montelukast- one daily for allergic nose and cough Sample Dymista nasal spray-  1-2 puffs each nostril once every day at bedtime. Try this instead of flonase/ fluticasone. When the sample runs out, go back to flonase Try otc allergy eyedrop [place] for allergy in eyes Schedule allergy skin tests    07/2013 rx allergy shots    09/26/2013 f/u ov/Wert re: HBP, GERD, DJD, chronic rhinitis Chief Complaint  Patient presents with  . Annual Exam    Pt not fasting today.  She is still c/o diarrhea and cramping-seeing GI for this problem.    rec Return fasting for labs > never did it F/u with Carroll Lingelbach 3 months > not done    08/17/2014 f/u ov/Wert re: hbp/ obesity/ djd/ gerd/ chronic rhinits/ now concerned she has dm due to numbness in toes  Chief Complaint  Patient presents with  . Acute Visit    Pt c/o pain in her toes- "pins and needles pain" for the past 2 months.  She had some chest discomfort and DOE a few wks ago- relates to working too much and is better now that she has rested.    numbness comes and goes avg once to three times a week/ lasts 5-10 min and resolves / only in big toes and never both a same time  >>no changes   11/26/2014 Follow up and Med review : HTN/GERD and CR  Pt returns for 3 month follow up .  We reviewed all her meds and organized them into a med calendar with pt education.  Appears to be taking meds correctly.  She has several As needed  meds -says she rarely uses them, most are  expired.  We discussed disposing of expired meds.  Says allergy symptoms more over last few week with drainage.  Discussed restarting nasal spray with astelin and flonase.  Reflux controled on prilosec . Off pepcid with no flare  No chest pain, orthopnea, edema or fever.   Has changed to Dr. Dorise Hiss for primary care.   Current Medications, Allergies, Complete Past Medical History, Past Surgical History, Family History, and Social History were reviewed in [employer] record.  ROS  The following are not active complaints unless bolded sore throat, dysphagia, dental problems, itching, sneezing,  nasal congestion or excess/ purulent secretions, ear ache,   fever, chills, sweats, unintended wt loss, pleuritic or exertional cp, hemoptysis,  orthopnea pnd or leg swelling, presyncope, palpitations, heartburn, abdominal pain, anorexia, nausea, vomiting,   or change in bowel or urinary habits, change in stools or urine, dysuria,hematuria,  rash, arthralgias, visual complaints, headache, numbness weakness or ataxia or problems with walking or coordination,  change in mood/affect or memory.             Past Medical History:  GERD(ICD-530.81)..............................................................................Marland KitchenRussella Dar  - EGD 04/28/2001 esophagitis with HH  Chronic constipation  Internal Hemorrhoids  - Colonoscopy 5/ 2006  - Colonoscopy ok except hem 11/20/08  MORBID OBESITY (ICD-278.01)  - Target wt = 158 for BMI <  30  EPISODIC TENSION TYPE HEADACHE (ICD-339.11)  Hypertension  Degenerative arthritis  - Arthroscopy Left knee, Dr Barry Dienes  Radicular back/ L leg pain 08/13/2011 > referred to [facility] > Gioffre rec mri ? done Herpes zoster-1998 with no significant residual R T 12  Hyperlipidemia  - Targe LDL < 160 (HBP only known RF)  Health Maintenance................................................................................Marland KitchenWert   - Pneumovax February 25, 2009,  prevnar 13 08/17/2014  - Td February 25, 2009  - CPX  08/17/2014  - GYN Senaida Ores -med calendar 12/21/2011 ,  12/29/2012 , 05/24/2013, 05/23/13 > did not bring with her 08/17/14 ., 11/26/2014   Family History:  positive for hypertension and diabetes in her mother  cancer of the leg in her mother does not know the type  She has no full siblings does not know her father's history  no gallbladder disease to her knowledge  No FH of Colon Cancer:   Social History:  she denies ever smoking  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        Objective:   Physical Exam  08/17/2014        187>188 11/26/2014        GENERAL:  Hoarse amb wf    HEENT: Mouth:  Top dentures  EACs-clear, TMs-wnl, NOSE-mod nonspecific Turbinate edema, THROAT-clear & wnl.  NECK: Supple w/ fair ROM; no JVD; normal carotid impulses w/o bruits; no thyromegaly or nodules palpated; no lymphadenopathy.  CHEST: Clear to P & A; w/o, wheezes/ rales/ or rhonchi.  HEART: RRR, no m/r/g heard. pedal pulses sym reduced bilaterally  ABDOMEN: Soft & nt; nml bowel sounds; no organomegaly or masses detected.  EXT: Warm bilat, no calf pain, edema, clubbing, pulses intact MS nl gait,  No deformities  Skin No rash.  Neuro: no focal deficits noted         Recent Labs Lab 08/17/14 1712  NA 138  K 4.0  CL 105  CO2 26  BUN 16  CREATININE 1.01  GLUCOSE 94    Recent Labs Lab 08/17/14 1712  HGB 13.4  HCT 40.8  WBC 6.7  PLT 203.0     Lab Results  Component Value Date   TSH 1.40 08/17/2014          Assessment & Plan:

## 2014-11-26 NOTE — Assessment & Plan Note (Signed)
Controled on rx

## 2014-11-26 NOTE — Progress Notes (Signed)
Chart and office note reviewed in detail  > agree with a/p as outlined    

## 2014-11-26 NOTE — Addendum Note (Signed)
Addended by: Boone Master E on: 11/26/2014 12:55 PM   Modules accepted: Orders, Medications

## 2014-11-26 NOTE — Assessment & Plan Note (Signed)
Controlled  Patient's medications were reviewed today and patient education was given. Computerized medication calendar was adjusted/completed   Plan  Cont on current regimen

## 2014-11-28 ENCOUNTER — Other Ambulatory Visit: Payer: Self-pay | Admitting: Internal Medicine

## 2014-12-03 ENCOUNTER — Ambulatory Visit: Payer: Medicare Other

## 2014-12-03 DIAGNOSIS — H2511 Age-related nuclear cataract, right eye: Secondary | ICD-10-CM | POA: Diagnosis not present

## 2014-12-03 DIAGNOSIS — H25811 Combined forms of age-related cataract, right eye: Secondary | ICD-10-CM | POA: Diagnosis not present

## 2014-12-04 ENCOUNTER — Ambulatory Visit (INDEPENDENT_AMBULATORY_CARE_PROVIDER_SITE_OTHER): Payer: BC Managed Care – PPO

## 2014-12-04 DIAGNOSIS — J309 Allergic rhinitis, unspecified: Secondary | ICD-10-CM

## 2014-12-05 ENCOUNTER — Ambulatory Visit: Payer: Medicare Other

## 2014-12-11 ENCOUNTER — Ambulatory Visit (INDEPENDENT_AMBULATORY_CARE_PROVIDER_SITE_OTHER): Payer: BC Managed Care – PPO

## 2014-12-11 DIAGNOSIS — J309 Allergic rhinitis, unspecified: Secondary | ICD-10-CM | POA: Diagnosis not present

## 2014-12-12 ENCOUNTER — Ambulatory Visit: Payer: Medicare Other

## 2014-12-17 ENCOUNTER — Ambulatory Visit (INDEPENDENT_AMBULATORY_CARE_PROVIDER_SITE_OTHER): Payer: BC Managed Care – PPO

## 2014-12-17 DIAGNOSIS — J309 Allergic rhinitis, unspecified: Secondary | ICD-10-CM

## 2014-12-18 ENCOUNTER — Encounter: Payer: Self-pay | Admitting: Internal Medicine

## 2014-12-24 ENCOUNTER — Ambulatory Visit (INDEPENDENT_AMBULATORY_CARE_PROVIDER_SITE_OTHER): Payer: BC Managed Care – PPO

## 2014-12-24 DIAGNOSIS — J309 Allergic rhinitis, unspecified: Secondary | ICD-10-CM

## 2014-12-31 ENCOUNTER — Ambulatory Visit (INDEPENDENT_AMBULATORY_CARE_PROVIDER_SITE_OTHER): Payer: BC Managed Care – PPO

## 2014-12-31 DIAGNOSIS — J309 Allergic rhinitis, unspecified: Secondary | ICD-10-CM | POA: Diagnosis not present

## 2015-01-07 ENCOUNTER — Ambulatory Visit (INDEPENDENT_AMBULATORY_CARE_PROVIDER_SITE_OTHER): Payer: BC Managed Care – PPO

## 2015-01-07 DIAGNOSIS — J309 Allergic rhinitis, unspecified: Secondary | ICD-10-CM

## 2015-01-14 ENCOUNTER — Ambulatory Visit (INDEPENDENT_AMBULATORY_CARE_PROVIDER_SITE_OTHER): Payer: BC Managed Care – PPO

## 2015-01-14 DIAGNOSIS — J309 Allergic rhinitis, unspecified: Secondary | ICD-10-CM

## 2015-01-15 ENCOUNTER — Telehealth: Payer: Self-pay | Admitting: *Deleted

## 2015-01-15 NOTE — Telephone Encounter (Signed)
Covermymeds states PA not required.  Spoke with pharmacy and they state it ran through as 1 month supply. [facility] will not allow a 3 month supply for this medication. Nothing further needed.

## 2015-01-21 ENCOUNTER — Ambulatory Visit (INDEPENDENT_AMBULATORY_CARE_PROVIDER_SITE_OTHER): Payer: BC Managed Care – PPO

## 2015-01-21 DIAGNOSIS — J309 Allergic rhinitis, unspecified: Secondary | ICD-10-CM

## 2015-01-28 ENCOUNTER — Ambulatory Visit: Payer: Medicare Other

## 2015-01-30 ENCOUNTER — Encounter: Payer: Self-pay | Admitting: Internal Medicine

## 2015-02-13 ENCOUNTER — Ambulatory Visit (INDEPENDENT_AMBULATORY_CARE_PROVIDER_SITE_OTHER): Payer: BC Managed Care – PPO

## 2015-02-13 DIAGNOSIS — J309 Allergic rhinitis, unspecified: Secondary | ICD-10-CM

## 2015-02-18 ENCOUNTER — Ambulatory Visit (INDEPENDENT_AMBULATORY_CARE_PROVIDER_SITE_OTHER): Payer: BC Managed Care – PPO

## 2015-02-18 DIAGNOSIS — J309 Allergic rhinitis, unspecified: Secondary | ICD-10-CM

## 2015-02-25 ENCOUNTER — Ambulatory Visit (INDEPENDENT_AMBULATORY_CARE_PROVIDER_SITE_OTHER): Payer: BC Managed Care – PPO

## 2015-02-25 DIAGNOSIS — J309 Allergic rhinitis, unspecified: Secondary | ICD-10-CM

## 2015-02-27 ENCOUNTER — Ambulatory Visit (INDEPENDENT_AMBULATORY_CARE_PROVIDER_SITE_OTHER): Payer: BC Managed Care – PPO | Admitting: Internal Medicine

## 2015-02-27 ENCOUNTER — Encounter: Payer: Self-pay | Admitting: Internal Medicine

## 2015-02-27 VITALS — BP 130/74 | HR 59 | Ht 62.0 in | Wt 192.4 lb

## 2015-02-27 DIAGNOSIS — Z23 Encounter for immunization: Secondary | ICD-10-CM | POA: Diagnosis not present

## 2015-02-27 DIAGNOSIS — R059 Cough, unspecified: Secondary | ICD-10-CM

## 2015-02-27 DIAGNOSIS — R05 Cough: Secondary | ICD-10-CM | POA: Diagnosis not present

## 2015-02-27 DIAGNOSIS — J309 Allergic rhinitis, unspecified: Secondary | ICD-10-CM

## 2015-02-27 NOTE — Progress Notes (Signed)
02/07/13- 72 yoF never smoker followed by Dr [name] for cough attributed to Upper airway cough/ postnasal drip. Referred by MW; allergy labs done on 12-13-12- for allergy evaluation. Allergy Profile 12/13/12- Total IgE 806.1, broad elevations to environmental allergens. Prod cough. Allergy vaccine 10 years ago elsewhere, stopped because of local reactions. Vaccine did not help mild wheeze, rhinitis, conjunctivitis.  This year similar eye and nose symptoms. Occasional mild wheeze no benefit with Flonase, Zyrtec, Pepcid, Chlor-Trimeton. Main complaint is cough. Lips itching. No phlegm. Little sneeze and some wheeze. Was better in [place]. Worse in [place]. Denies history of asthma. On Pepcid plus omeprazole and not aware of reflux. Admits postnasal drip. Dry itching skin without rash. Bananas cause nausea, peaches and grapes make her lips itch. Allergy Profile 12/13/2012-total IgE 806.1 with significant elevations for common environmental allergens. Environmental-house with no basement, central air, carpet, no pets or smokers  06/14/13- 72 yoF never smoker followed by Dr [name] for cough attributed to Upper airway cough/ postnasal drip. Allergic rhinitis, Food allergy Comes today for allergy testing. Still some cough and nasal congestion but no wheeze. No problems with foods as long as she avoids bananas, peaches and grapes. Points out some dermatitis on her ankles. Allergy Skin test 06/14/13- Strong positive puncture reactions, no I.D tests. Given allegra.  07/20/12-  72 yoF never smoker followed for Allergic rhinitis, Food allergy. Followed  by Dr [name] for cough attributed to Upper airway cough/ postnasal drip. Complicated by GERD, obesity FOLLOWS FOR:  Pt reports cough is better -- still having some sinus pressure and itchy ears with PND She had hx of strong local reactions to allergy shots at another office many years ago. She wanted to discuss another try. We reviewed her symptom pattern and skin test  results.  Dymista some help. Still some post nasal drip.   10/17/13-  72 yoF never smoker followed for Allergic rhinitis, Food allergy. Followed  by Dr [name] for cough attributed to Upper airway cough/ postnasal drip. Complicated by GERD, obesity FOLLOWS FOR: Pt states she can tell a huge difference being on allergy injections. Now building vaccine here at 1:5000. No problem with reactions. She has stayed in some, avoiding pollen. She is very pleased. Cough is gone. CXR 09/26/13 IMPRESSION:  1. No acute cardiopulmonary disease.  2. Stable cardiomegaly.  Electronically Signed  By: Maisie Fus Register  On: 09/26/2013 14:09   02/27/15- 69 yoF never smoker followed for Allergic rhinitis, Food allergy. Followed  by Dr [name] for cough attributed to Upper airway cough/ postnasal drip. Complicated by GERD, obesity Allergy vaccine  1:500     GH FOLLOWS FOR: pt states she is doing great. pt recievng allergy vaccine once a week, and working good for pt.  She considers allergy vaccine "a big help" although we hold at the highest tolerated dose 1:500. Chronic cough is better. Using Flonase and Astelin when needed.  ROS-see HPI Constitutional:   No-   weight loss, night sweats, fevers, chills, fatigue, lassitude. HEENT:   No-  headaches, difficulty swallowing, tooth/dental problems, sore throat,       No- sneezing,itching, no-ear ache, nasal congestion, +post nasal drip,  CV:  No-   chest pain, orthopnea, PND, swelling in lower extremities, anasarca, dizziness, palpitations Resp: No-   shortness of breath with exertion or at rest.              No-productive cough,  + non-productive cough,  No- coughing up of blood.  No-   change in color of mucus.  No- wheezing.   Skin: No-   rash or lesions. GI:  No-   heartburn, indigestion, abdominal pain, nausea, vomiting, GU:  MS:  No-   joint pain or swelling.   Neuro-     nothing unusual Psych:  No- change in mood or affect. No depression or anxiety.  No  memory loss.  OBJ- Physical Exam General- Alert, Oriented, Affect-appropriate, Distress- none acute Skin- rash-none, lesions- none, excoriation- none Lymphadenopathy- none Head- atraumatic            Eyes- Gross vision intact, PERRLA, conjunctivae and secretions clear. +periorbital edema            Ears- Hearing, canals-normal            Nose- + red mucosa, no-Septal dev, mucus, polyps, erosion, perforation             Throat- Mallampati III , mucosa clear , drainage- none, tonsils- atrophic, +dentures, + hoarse Neck- flexible , trachea midline, no stridor , thyroid nl, carotid no bruit Chest - symmetrical excursion , unlabored           Heart/CV- RRR , no murmur , no gallop  , no rub, nl s1 s2                           - JVD- none , edema- none, stasis changes- none, varices- none           Lung- clear to P&A, wheeze- none, cough-none , dullness-none, rub- none           Chest wall-  Abd-  Br/ Gen/ Rectal- Not done, not indicated Extrem- cyanosis- none, clubbing, none, atrophy- none, strength- nl Neuro- grossly intact to observation

## 2015-02-27 NOTE — Patient Instructions (Signed)
We can continue allergy vaccine here, building as tolerated  Flu vax  Ok to use the flonase and azelastine nasal sprays  Ok to add an otc antihistamine when needed

## 2015-03-03 NOTE — Assessment & Plan Note (Signed)
We will continue to hold allergy vaccine at 1:500 since that's the highest she has tolerated well. She believes it helps her. Note on exam that nasal mucosa was pale, typical of allergy, at last visit. On current visit mucosa looks red.

## 2015-03-03 NOTE — Assessment & Plan Note (Signed)
She is trying to follow Dr. Thurston Hole instructions. Cough is somewhat better. Plan-flu vaccine

## 2015-03-04 ENCOUNTER — Ambulatory Visit (INDEPENDENT_AMBULATORY_CARE_PROVIDER_SITE_OTHER): Payer: BC Managed Care – PPO

## 2015-03-04 DIAGNOSIS — J309 Allergic rhinitis, unspecified: Secondary | ICD-10-CM

## 2015-03-11 ENCOUNTER — Ambulatory Visit (INDEPENDENT_AMBULATORY_CARE_PROVIDER_SITE_OTHER): Payer: BC Managed Care – PPO

## 2015-03-11 DIAGNOSIS — J309 Allergic rhinitis, unspecified: Secondary | ICD-10-CM | POA: Diagnosis not present

## 2015-03-18 ENCOUNTER — Ambulatory Visit (INDEPENDENT_AMBULATORY_CARE_PROVIDER_SITE_OTHER): Payer: BC Managed Care – PPO

## 2015-03-18 DIAGNOSIS — J309 Allergic rhinitis, unspecified: Secondary | ICD-10-CM | POA: Diagnosis not present

## 2015-03-20 ENCOUNTER — Telehealth: Payer: Self-pay | Admitting: Pulmonary Disease

## 2015-03-20 NOTE — Telephone Encounter (Signed)
Patient called to inquire if the insurance company was called regarding her Omeprazole prescription. Please follow up in AM.

## 2015-03-20 NOTE — Telephone Encounter (Signed)
Per Dr Isaiah SergeMannam, message will be held in triage until tomorrow morning to follow up on

## 2015-03-21 NOTE — Telephone Encounter (Signed)
Not sure what is needed here/ also available otc

## 2015-03-25 ENCOUNTER — Ambulatory Visit (INDEPENDENT_AMBULATORY_CARE_PROVIDER_SITE_OTHER): Payer: BC Managed Care – PPO

## 2015-03-25 DIAGNOSIS — J309 Allergic rhinitis, unspecified: Secondary | ICD-10-CM | POA: Diagnosis not present

## 2015-04-01 ENCOUNTER — Ambulatory Visit (INDEPENDENT_AMBULATORY_CARE_PROVIDER_SITE_OTHER): Payer: BC Managed Care – PPO

## 2015-04-01 DIAGNOSIS — J309 Allergic rhinitis, unspecified: Secondary | ICD-10-CM | POA: Diagnosis not present

## 2015-04-08 ENCOUNTER — Ambulatory Visit (INDEPENDENT_AMBULATORY_CARE_PROVIDER_SITE_OTHER): Payer: BC Managed Care – PPO

## 2015-04-08 DIAGNOSIS — J309 Allergic rhinitis, unspecified: Secondary | ICD-10-CM | POA: Diagnosis not present

## 2015-04-15 ENCOUNTER — Ambulatory Visit (INDEPENDENT_AMBULATORY_CARE_PROVIDER_SITE_OTHER): Payer: BC Managed Care – PPO

## 2015-04-15 DIAGNOSIS — J309 Allergic rhinitis, unspecified: Secondary | ICD-10-CM

## 2015-04-22 ENCOUNTER — Ambulatory Visit (INDEPENDENT_AMBULATORY_CARE_PROVIDER_SITE_OTHER): Payer: BC Managed Care – PPO

## 2015-04-22 DIAGNOSIS — J309 Allergic rhinitis, unspecified: Secondary | ICD-10-CM

## 2015-04-24 ENCOUNTER — Ambulatory Visit (INDEPENDENT_AMBULATORY_CARE_PROVIDER_SITE_OTHER): Payer: BC Managed Care – PPO

## 2015-04-24 ENCOUNTER — Telehealth: Payer: Self-pay | Admitting: Internal Medicine

## 2015-04-24 DIAGNOSIS — J309 Allergic rhinitis, unspecified: Secondary | ICD-10-CM

## 2015-04-24 NOTE — Telephone Encounter (Signed)
Allergy Serum Extract Date Mixed: 04/24/2015 Vial: AB Strength: 1:500 Here/Mail/Pick Up: Here Mixed By: Jaynee EaglesLindsay Lemons, CMA Last OV: 02/27/2015 Pending OV: 02/27/2016

## 2015-04-29 ENCOUNTER — Ambulatory Visit (INDEPENDENT_AMBULATORY_CARE_PROVIDER_SITE_OTHER): Payer: BC Managed Care – PPO

## 2015-04-29 DIAGNOSIS — J309 Allergic rhinitis, unspecified: Secondary | ICD-10-CM

## 2015-05-01 DIAGNOSIS — N959 Unspecified menopausal and perimenopausal disorder: Secondary | ICD-10-CM | POA: Diagnosis not present

## 2015-05-01 DIAGNOSIS — Z01419 Encounter for gynecological examination (general) (routine) without abnormal findings: Secondary | ICD-10-CM | POA: Diagnosis not present

## 2015-05-01 DIAGNOSIS — Z6833 Body mass index (BMI) 33.0-33.9, adult: Secondary | ICD-10-CM | POA: Diagnosis not present

## 2015-05-06 ENCOUNTER — Ambulatory Visit (INDEPENDENT_AMBULATORY_CARE_PROVIDER_SITE_OTHER): Payer: BC Managed Care – PPO

## 2015-05-06 DIAGNOSIS — J309 Allergic rhinitis, unspecified: Secondary | ICD-10-CM

## 2015-05-13 ENCOUNTER — Ambulatory Visit (INDEPENDENT_AMBULATORY_CARE_PROVIDER_SITE_OTHER): Payer: BC Managed Care – PPO

## 2015-05-13 DIAGNOSIS — J309 Allergic rhinitis, unspecified: Secondary | ICD-10-CM

## 2015-05-20 ENCOUNTER — Ambulatory Visit (INDEPENDENT_AMBULATORY_CARE_PROVIDER_SITE_OTHER): Payer: BC Managed Care – PPO

## 2015-05-20 DIAGNOSIS — J309 Allergic rhinitis, unspecified: Secondary | ICD-10-CM

## 2015-05-27 ENCOUNTER — Ambulatory Visit (INDEPENDENT_AMBULATORY_CARE_PROVIDER_SITE_OTHER): Payer: BC Managed Care – PPO

## 2015-05-27 DIAGNOSIS — J309 Allergic rhinitis, unspecified: Secondary | ICD-10-CM | POA: Diagnosis not present

## 2015-06-05 ENCOUNTER — Ambulatory Visit (INDEPENDENT_AMBULATORY_CARE_PROVIDER_SITE_OTHER): Payer: BC Managed Care – PPO

## 2015-06-05 DIAGNOSIS — J309 Allergic rhinitis, unspecified: Secondary | ICD-10-CM | POA: Diagnosis not present

## 2015-06-06 ENCOUNTER — Ambulatory Visit (INDEPENDENT_AMBULATORY_CARE_PROVIDER_SITE_OTHER): Payer: BC Managed Care – PPO | Admitting: Internal Medicine

## 2015-06-06 ENCOUNTER — Other Ambulatory Visit (INDEPENDENT_AMBULATORY_CARE_PROVIDER_SITE_OTHER): Payer: BC Managed Care – PPO

## 2015-06-06 ENCOUNTER — Encounter: Payer: Self-pay | Admitting: Internal Medicine

## 2015-06-06 VITALS — BP 140/82 | HR 69 | Temp 98.3°F | Resp 16 | Ht 62.0 in | Wt 188.1 lb

## 2015-06-06 DIAGNOSIS — E669 Obesity, unspecified: Secondary | ICD-10-CM | POA: Diagnosis not present

## 2015-06-06 DIAGNOSIS — I10 Essential (primary) hypertension: Secondary | ICD-10-CM | POA: Diagnosis not present

## 2015-06-06 DIAGNOSIS — E785 Hyperlipidemia, unspecified: Secondary | ICD-10-CM | POA: Diagnosis not present

## 2015-06-06 LAB — COMPREHENSIVE METABOLIC PANEL
ALT: 11 U/L (ref 0–35)
AST: 15 U/L (ref 0–37)
Albumin: 4.1 g/dL (ref 3.5–5.2)
Alkaline Phosphatase: 89 U/L (ref 39–117)
BUN: 25 mg/dL — ABNORMAL HIGH (ref 6–23)
CALCIUM: 9.6 mg/dL (ref 8.4–10.5)
CHLORIDE: 108 meq/L (ref 96–112)
CO2: 23 meq/L (ref 19–32)
Creatinine, Ser: 1.03 mg/dL (ref 0.40–1.20)
GFR: 67.3 mL/min (ref >60.00)
Glucose, Bld: 95 mg/dL (ref 70–99)
POTASSIUM: 3.9 meq/L (ref 3.5–5.1)
SODIUM: 142 meq/L (ref 135–145)
Total Bilirubin: 0.5 mg/dL (ref 0.2–1.2)
Total Protein: 7.3 g/dL (ref 6.0–8.3)

## 2015-06-06 LAB — LIPID PANEL
CHOLESTEROL: 228 mg/dL — AB (ref 0–200)
HDL: 50.8 mg/dL (ref >39.00)
LDL CALC: 164 mg/dL — AB (ref 0–99)
NonHDL: 177.47
Total CHOL/HDL Ratio: 4
Triglycerides: 68 mg/dL (ref 0.0–149.0)
VLDL: 13.6 mg/dL (ref 0.0–40.0)

## 2015-06-06 LAB — HEMOGLOBIN A1C: Hgb A1c MFr Bld: 5.7 % (ref 4.6–6.5)

## 2015-06-06 MED ORDER — MELOXICAM 7.5 MG PO TABS: 7.5000 mg | ORAL_TABLET | Freq: Every day | ORAL | Status: DC

## 2015-06-06 NOTE — Patient Instructions (Signed)
We are going to check the labs today including a test for the sugars.   We will call you back with the results even if everything is normal.   Health Maintenance, Female Adopting a healthy lifestyle and getting preventive care can go a long way to promote health and wellness. Talk with your health care provider about what schedule of regular examinations is right for you. This is a good chance for you to check in with your provider about disease prevention and staying healthy. In between checkups, there are plenty of things you can do on your own. Experts have done a lot of research about which lifestyle changes and preventive measures are most likely to keep you healthy. Ask your health care provider for more information. WEIGHT AND DIET  Eat a healthy diet  Be sure to include plenty of vegetables, fruits, low-fat dairy products, and lean protein.  Do not eat a lot of foods high in solid fats, added sugars, or salt.  Get regular exercise. This is one of the most important things you can do for your health.  Most adults should exercise for at least 150 minutes each week. The exercise should increase your heart rate and make you sweat (moderate-intensity exercise).  Most adults should also do strengthening exercises at least twice a week. This is in addition to the moderate-intensity exercise.  Maintain a healthy weight  Body mass index (BMI) is a measurement that can be used to identify possible weight problems. It estimates body fat based on height and weight. Your health care provider can help determine your BMI and help you achieve or maintain a healthy weight.  For females 74 years of age and older:   A BMI below 18.5 is considered underweight.  A BMI of 18.5 to 24.9 is normal.  A BMI of 25 to 29.9 is considered overweight.  A BMI of 30 and above is considered obese.  Watch levels of cholesterol and blood lipids  You should start having your blood tested for lipids and  cholesterol at 74 years of age, then have this test every 5 years.  You may need to have your cholesterol levels checked more often if:  Your lipid or cholesterol levels are high.  You are older than 74 years of age.  You are at high risk for heart disease.  CANCER SCREENING   Lung Cancer  Lung cancer screening is recommended for adults 74-74 years old who are at high risk for lung cancer because of a history of smoking.  A yearly low-dose CT scan of the lungs is recommended for people who:  Currently smoke.  Have quit within the past 15 years.  Have at least a 30-pack-year history of smoking. A pack year is smoking an average of one pack of cigarettes a day for 1 year.  Yearly screening should continue until it has been 15 years since you quit.  Yearly screening should stop if you develop a health problem that would prevent you from having lung cancer treatment.  Breast Cancer  Practice breast self-awareness. This means understanding how your breasts normally appear and feel.  It also means doing regular breast self-exams. Let your health care provider know about any changes, no matter how small.  If you are in your 74s or 74s, you should have a clinical breast exam (CBE) by a health care provider every 1-3 years as part of a regular health exam.  If you are 74 or older, have a CBE every year.  Also consider having a breast X-ray (mammogram) every year.  If you have a family history of breast cancer, talk to your health care provider about genetic screening.  If you are at high risk for breast cancer, talk to your health care provider about having an MRI and a mammogram every year.  Breast cancer gene (BRCA) assessment is recommended for women who have family members with BRCA-related cancers. BRCA-related cancers include:  Breast.  Ovarian.  Tubal.  Peritoneal cancers.  Results of the assessment will determine the need for genetic counseling and BRCA1 and BRCA2  testing. Cervical Cancer Your health care provider may recommend that you be screened regularly for cancer of the pelvic organs (ovaries, uterus, and vagina). This screening involves a pelvic examination, including checking for microscopic changes to the surface of your cervix (Pap test). You may be encouraged to have this screening done every 3 years, beginning at age 21.  For women ages 30-65, health care providers may recommend pelvic exams and Pap testing every 3 years, or they may recommend the Pap and pelvic exam, combined with testing for human papilloma virus (HPV), every 5 years. Some types of HPV increase your risk of cervical cancer. Testing for HPV may also be done on women of any age with unclear Pap test results.  Other health care providers may not recommend any screening for nonpregnant women who are considered low risk for pelvic cancer and who do not have symptoms. Ask your health care provider if a screening pelvic exam is right for you.  If you have had past treatment for cervical cancer or a condition that could lead to cancer, you need Pap tests and screening for cancer for at least 20 years after your treatment. If Pap tests have been discontinued, your risk factors (such as having a new sexual partner) need to be reassessed to determine if screening should resume. Some women have medical problems that increase the chance of getting cervical cancer. In these cases, your health care provider may recommend more frequent screening and Pap tests. Colorectal Cancer  This type of cancer can be detected and often prevented.  Routine colorectal cancer screening usually begins at 74 years of age and continues through 75 years of age.  Your health care provider may recommend screening at an earlier age if you have risk factors for colon cancer.  Your health care provider may also recommend using home test kits to check for hidden blood in the stool.  A small camera at the end of a  tube can be used to examine your colon directly (sigmoidoscopy or colonoscopy). This is done to check for the earliest forms of colorectal cancer.  Routine screening usually begins at age 50.  Direct examination of the colon should be repeated every 5-10 years through 75 years of age. However, you may need to be screened more often if early forms of precancerous polyps or small growths are found. Skin Cancer  Check your skin from head to toe regularly.  Tell your health care provider about any new moles or changes in moles, especially if there is a change in a mole's shape or color.  Also tell your health care provider if you have a mole that is larger than the size of a pencil eraser.  Always use sunscreen. Apply sunscreen liberally and repeatedly throughout the day.  Protect yourself by wearing long sleeves, pants, a wide-brimmed hat, and sunglasses whenever you are outside. HEART DISEASE, DIABETES, AND HIGH BLOOD PRESSURE     High blood pressure causes heart disease and increases the risk of stroke. High blood pressure is more likely to develop in:  People who have blood pressure in the high end of the normal range (130-139/85-89 mm Hg).  People who are overweight or obese.  People who are African American.  If you are 7-63 years of age, have your blood pressure checked every 3-5 years. If you are 44 years of age or older, have your blood pressure checked every year. You should have your blood pressure measured twice--once when you are at a hospital or clinic, and once when you are not at a hospital or clinic. Record the average of the two measurements. To check your blood pressure when you are not at a hospital or clinic, you can use:  An automated blood pressure machine at a pharmacy.  A home blood pressure monitor.  If you are between 92 years and 67 years old, ask your health care provider if you should take aspirin to prevent strokes.  Have regular diabetes screenings. This  involves taking a blood sample to check your fasting blood sugar level.  If you are at a normal weight and have a low risk for diabetes, have this test once every three years after 74 years of age.  If you are overweight and have a high risk for diabetes, consider being tested at a younger age or more often. PREVENTING INFECTION  Hepatitis B  If you have a higher risk for hepatitis B, you should be screened for this virus. You are considered at high risk for hepatitis B if:  You were born in a country where hepatitis B is common. Ask your health care provider which countries are considered high risk.  Your parents were born in a high-risk country, and you have not been immunized against hepatitis B (hepatitis B vaccine).  You have HIV or AIDS.  You use needles to inject street drugs.  You live with someone who has hepatitis B.  You have had sex with someone who has hepatitis B.  You get hemodialysis treatment.  You take certain medicines for conditions, including cancer, organ transplantation, and autoimmune conditions. Hepatitis C  Blood testing is recommended for:  Everyone born from [birth year] through 1965.  Anyone with known risk factors for hepatitis C. Sexually transmitted infections (STIs)  You should be screened for sexually transmitted infections (STIs) including gonorrhea and chlamydia if:  You are sexually active and are younger than 74 years of age.  You are older than 74 years of age and your health care provider tells you that you are at risk for this type of infection.  Your sexual activity has changed since you were last screened and you are at an increased risk for chlamydia or gonorrhea. Ask your health care provider if you are at risk.  If you do not have HIV, but are at risk, it may be recommended that you take a prescription medicine daily to prevent HIV infection. This is called pre-exposure prophylaxis (PrEP). You are considered at risk if:  You are  sexually active and do not regularly use condoms or know the HIV status of your partner(s).  You take drugs by injection.  You are sexually active with a partner who has HIV. Talk with your health care provider about whether you are at high risk of being infected with HIV. If you choose to begin PrEP, you should first be tested for HIV. You should then be tested every 3 months for as  long as you are taking PrEP.  PREGNANCY   If you are premenopausal and you may become pregnant, ask your health care provider about preconception counseling.  If you may become pregnant, take 400 to 800 micrograms (mcg) of folic acid every day.  If you want to prevent pregnancy, talk to your health care provider about birth control (contraception). OSTEOPOROSIS AND MENOPAUSE   Osteoporosis is a disease in which the bones lose minerals and strength with aging. This can result in serious bone fractures. Your risk for osteoporosis can be identified using a bone density scan.  If you are 27 years of age or older, or if you are at risk for osteoporosis and fractures, ask your health care provider if you should be screened.  Ask your health care provider whether you should take a calcium or vitamin D supplement to lower your risk for osteoporosis.  Menopause may have certain physical symptoms and risks.  Hormone replacement therapy may reduce some of these symptoms and risks. Talk to your health care provider about whether hormone replacement therapy is right for you.  HOME CARE INSTRUCTIONS   Schedule regular health, dental, and eye exams.  Stay current with your immunizations.   Do not use any tobacco products including cigarettes, chewing tobacco, or electronic cigarettes.  If you are pregnant, do not drink alcohol.  If you are breastfeeding, limit how much and how often you drink alcohol.  Limit alcohol intake to no more than 1 drink per day for nonpregnant women. One drink equals 12 ounces of beer, 5  ounces of wine, or 1 ounces of hard liquor.  Do not use street drugs.  Do not share needles.  Ask your health care provider for help if you need support or information about quitting drugs.  Tell your health care provider if you often feel depressed.  Tell your health care provider if you have ever been abused or do not feel safe at home.   This information is not intended to replace advice given to you by your health care provider. Make sure you discuss any questions you have with your health care provider.   Document Released: 12/08/2010 Document Revised: 06/15/2014 Document Reviewed: 04/26/2013 Elsevier Interactive Patient Education [employer].

## 2015-06-06 NOTE — Assessment & Plan Note (Signed)
Not on medication and no lipid panel recently. Checking today and goal LDL <130.

## 2015-06-06 NOTE — Progress Notes (Signed)
Pre visit review using our clinic review tool, if applicable. No additional management support is needed unless otherwise documented below in the visit note. 

## 2015-06-06 NOTE — Assessment & Plan Note (Signed)
BP controlled on spironolactone, checking CMP today. She is also on potassium pills and may need to stop those. Previous K middle range. No side effects.

## 2015-06-06 NOTE — Assessment & Plan Note (Signed)
Weight about the same since last visit and she has not started exercising.

## 2015-06-06 NOTE — Progress Notes (Signed)
   Subjective:    Patient ID: Catherine Hoffman, female    DOB: [birth year], 74 y.o.   MRN: [id number]  HPI The patient is a 74 YO female coming in for follow up of her health. She is not having any new concerns. She is still taking her medicines and most of them are as needed only. She is out of meloxicam and is having more arthritis pains. Does not use daily. No headaches or chest pains or SOB.  Review of Systems  Constitutional: Positive for activity change. Negative for fever, appetite change, fatigue and unexpected weight change.       Not exercising  HENT: Positive for congestion and postnasal drip. Negative for rhinorrhea, sinus pressure, sore throat and trouble swallowing.   Respiratory: Negative for chest tightness, shortness of breath and wheezing.   Cardiovascular: Negative for chest pain, palpitations and leg swelling.  Gastrointestinal: Negative for nausea, abdominal pain, diarrhea, constipation and abdominal distention.  Musculoskeletal: Positive for arthralgias. Negative for myalgias, back pain and gait problem.  Skin: Negative.   Neurological: Negative for dizziness, syncope, weakness, light-headedness and numbness.  Psychiatric/Behavioral: Negative.       Objective:   Physical Exam  Constitutional: She is oriented to person, place, and time. She appears well-developed and well-nourished.  Overweight  HENT:  Head: Normocephalic and atraumatic.  Eyes: EOM are normal.  Neck: Normal range of motion.  Cardiovascular: Normal rate and regular rhythm.   Pulmonary/Chest: Effort normal. No respiratory distress. She has no wheezes. She has no rales.  Abdominal: Soft. Bowel sounds are normal. She exhibits no distension. There is no tenderness.  Musculoskeletal: She exhibits no edema.  Neurological: She is alert and oriented to person, place, and time. Coordination normal.  Skin: Skin is warm and dry.  Psychiatric: She has a normal mood and affect.   Filed Vitals:   06/06/15 0804   BP: 140/82  Pulse: 69  Temp: 98.3 F (36.8 C)  TempSrc: Oral  Resp: 16  Height: 5\' 2"  (1.575 m)  Weight: 188 lb 1.9 oz (85.331 kg)  SpO2: 96%      Assessment & Plan:

## 2015-06-11 ENCOUNTER — Ambulatory Visit (INDEPENDENT_AMBULATORY_CARE_PROVIDER_SITE_OTHER): Payer: BC Managed Care – PPO

## 2015-06-11 DIAGNOSIS — J309 Allergic rhinitis, unspecified: Secondary | ICD-10-CM | POA: Diagnosis not present

## 2015-06-17 ENCOUNTER — Ambulatory Visit: Payer: BC Managed Care – PPO

## 2015-06-19 ENCOUNTER — Ambulatory Visit (INDEPENDENT_AMBULATORY_CARE_PROVIDER_SITE_OTHER): Payer: BC Managed Care – PPO | Admitting: Acute Care

## 2015-06-19 ENCOUNTER — Encounter: Payer: Self-pay | Admitting: *Deleted

## 2015-06-19 ENCOUNTER — Ambulatory Visit (INDEPENDENT_AMBULATORY_CARE_PROVIDER_SITE_OTHER)
Admission: RE | Admit: 2015-06-19 | Discharge: 2015-06-19 | Disposition: A | Discharge status: Home / Self Care | Payer: BC Managed Care – PPO | Source: Ambulatory Visit | Attending: Acute Care | Admitting: Acute Care

## 2015-06-19 ENCOUNTER — Encounter: Payer: Self-pay | Admitting: Acute Care

## 2015-06-19 ENCOUNTER — Ambulatory Visit: Payer: BC Managed Care – PPO

## 2015-06-19 ENCOUNTER — Ambulatory Visit (INDEPENDENT_AMBULATORY_CARE_PROVIDER_SITE_OTHER): Payer: BC Managed Care – PPO

## 2015-06-19 VITALS — BP 124/82 | HR 71 | Temp 98.2°F | Ht 62.0 in | Wt 189.4 lb

## 2015-06-19 DIAGNOSIS — R05 Cough: Secondary | ICD-10-CM | POA: Diagnosis not present

## 2015-06-19 DIAGNOSIS — R059 Cough, unspecified: Secondary | ICD-10-CM

## 2015-06-19 DIAGNOSIS — J309 Allergic rhinitis, unspecified: Secondary | ICD-10-CM | POA: Diagnosis not present

## 2015-06-19 MED ORDER — PREDNISONE 10 MG PO TABS: ORAL_TABLET | ORAL | Status: DC

## 2015-06-19 MED ORDER — AZITHROMYCIN 250 MG PO TABS: ORAL_TABLET | ORAL | Status: DC

## 2015-06-19 NOTE — Progress Notes (Addendum)
Subjective:    Patient ID: Catherine Hoffman, female    DOB: February 11, 1941, 75 y.o.   MRN: [id number]  HPI [id number] yobf never smoker with hypertension, degenerative arthritis, followed by Dr. Sherene SiresWert for chronic cough, followed by Dr. Maple HudsonYoung for allergies,allergic rhinitis.  Significant Studies: EXAM: 06/19/15 CHEST 2 VIEW  COMPARISON: 08/17/2015.  FINDINGS: Mediastinum and hilar structures are normal. Cardiomegaly with normal pulmonary vascularity. Low lung volumes with mild basilar atelectasis and/or scarring. Mild infiltrate left upper lobe cannot be excluded. No pleural effusion or pneumothorax. No acute bony abnormality.  IMPRESSION: 1. Mild infiltrate left upper lobe cannot be excluded. Mild bibasilar atelectasis and/or scarring. 2. Stable cardiomegaly .  06/19/15: Acute Office Visit for Cough. Pt. Presents today with cough, head ache, increased sputum production for about a week.She is coughing up copious amounts of  yellow sputum. 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 She does not endorse a fever , but upon questioning she has had chills and sweats, which I expect are fever.She has had wheezing especially at night. CXR today, reviewed personally by me, shows possible LUL infiltrate, bilateral atelectasis.   Past Medical History  Diagnosis Date  . Esophageal reflux   . Hypertension   . Episodic tension type headache   . Hyperlipidemia   . Arthritis   . Left rotator cuff tear   . Kidney stone on left side Hx-date unknown  . Internal hemorrhoids   . GERD (gastroesophageal reflux disease)     Current outpatient prescriptions:  .  aspirin 81 MG tablet, Take 81 mg by mouth daily.  , Disp: , Rfl:  .  azelastine (ASTELIN) 0.1 % nasal spray, Place 2 sprays into both nostrils daily., Disp: , Rfl:  .  calcium-vitamin D (OSCAL WITH D) 500-200 MG-UNIT per tablet, Take 1 tablet by mouth 2 (two) times daily.  , Disp: , Rfl:  .  cetirizine (ZYRTEC) 10 MG  tablet, Take in am daily as needed for drippy nose, Disp: , Rfl:  .  dextromethorphan (DELSYM) 30 MG/5ML liquid, Reported on 06/06/2015, Disp: , Rfl:  .  docusate sodium (COLACE) 100 MG capsule, Reported on 06/06/2015, Disp: , Rfl:  .  estradiol (ESTRACE) 0.5 MG tablet, Take 0.5 mg by mouth daily., Disp: , Rfl:  .  fluticasone (FLONASE) 50 MCG/ACT nasal spray, Place 2 sprays into both nostrils daily., Disp: , Rfl:  .  latanoprost (XALATAN) 0.005 % ophthalmic solution, Place 1 drop into both eyes at bedtime., Disp: , Rfl:  .  meclizine (ANTIVERT) 25 MG tablet, Take 1 tablet (25 mg total) by mouth every 8 (eight) hours as needed for dizziness., Disp: 30 tablet, Rfl: 0 .  meloxicam (MOBIC) 7.5 MG tablet, Take 1 tablet (7.5 mg total) by mouth daily., Disp: 90 tablet, Rfl: 1 .  methocarbamol (ROBAXIN) 500 MG tablet, Take 1 tablet (500 mg total) by mouth every 6 (six) hours as needed., Disp: 40 tablet, Rfl: 1 .  NONFORMULARY OR COMPOUNDED ITEM, Allergy Vaccine [facility]:500 [facility], Disp: , Rfl:  .  omeprazole (PRILOSEC) 40 MG capsule, TAKE ONE CAPSULE BY MOUTH 30 MINUTES BEFORE FIRST AND LAST MEAL OF THE DAY, Disp: 180 capsule, Rfl: 0 .  oxyCODONE-acetaminophen (PERCOCET/ROXICET) 5-325 MG per tablet, Take 1 tablet by mouth every 4 (four) hours as needed. (this has tylenol in it), Disp: 60 tablet, Rfl: 0 .  oxymetazoline (AFRIN) 0.05 % nasal spray, Place 2 sprays into both nostrils 2 (two)  times daily as needed (for 5 days for nasal stuffiness)., Disp: , Rfl:  .  potassium chloride SA (K-DUR,KLOR-CON) 20 MEQ tablet, TAKE 1 TABLET BY MOUTH EVERY DAY, Disp: 30 tablet, Rfl: 0 .  promethazine (PHENERGAN) 25 MG tablet, Reported on 06/06/2015, Disp: , Rfl:  .  spironolactone (ALDACTONE) 25 MG tablet, TAKE 1 TABLET BY MOUTH TWICE DAILY, Disp: 180 tablet, Rfl: 1 .  traZODone (DESYREL) 50 MG tablet, 1-2 at bedtime as needed for trouble sleeping, Disp: , Rfl:  .  azithromycin (ZITHROMAX) 250 MG tablet,  Take as directed, Disp: 6 each, Rfl: 0 .  predniSONE (DELTASONE) 10 MG tablet, Take 4 tablets x 2 days, 3 x 2 days, 2 x 2 days, 1 x 2 days then stop, Disp: 20 tablet, Rfl: 0     Review of Systems   .Constitutional:   No  weight loss, night sweats, + Fevers, +chills, fatigue, or  lassitude.  HEENT:   + headaches, No Difficulty swallowing,  Tooth/dental problems, or  Sore throat,                No sneezing, itching, ear ache,  nasal congestion, post nasal drip,   CV:  No chest pain,  Orthopnea, PND, swelling in lower extremities, anasarca, dizziness, palpitations, syncope.   GI  No heartburn, indigestion, abdominal pain, nausea, vomiting, diarrhea, change in bowel habits, loss of appetite, bloody stools.   Resp: No shortness of breath with exertion or at rest.  + excess mucus, + productive cough,  No non-productive cough,  No coughing up of blood.  + change in color of mucus.  + night time  wheezing.  No chest wall deformity  Skin: no rash or lesions.  GU: no dysuria, change in color of urine, no urgency or frequency.  No flank pain, no hematuria   MS:  + left hip pain no swelling.  No decreased range of motion.  No back pain.  Psych:  No change in mood or affect. No depression or anxiety.  No memory loss.        Objective:   Physical Exam  BP 124/82 mmHg  Pulse 71  Temp(Src) 98.2 F (36.8 C) (Oral)  Ht 5\' 2"  (1.575 m)  Wt 189 lb 6.4 oz (85.911 kg)  BMI 34.63 kg/m2  SpO2 98%  Physical Exam:  General- No distress,  A&Ox3, but clearly does not feel well. ENT: No sinus tenderness, TM clear, pale nasal mucosa, no oral exudate,no post nasal drip, no LAN Cardiac: S1, S2, regular rate and rhythm, no murmur Chest: +  Wheeze with deep breath/ no rales/ no dullness ; diminished per bases bilaterally, no accessory muscle use, no nasal flaring, no sternal retractions Abd.: Soft Non-tender Ext: 1+edema bilaterally Neuro:  normal strength Skin: No rashes, warm and dry Psych: normal  mood and behavior       Assessment & Plan:

## 2015-06-19 NOTE — Assessment & Plan Note (Addendum)
Acute worsening of cough with fever, chills and sweats,yellow productive sputum and wheezing in addition to headache and body aches. 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  Plan: Z-Pack Yogurt or OTC probiotics while on antibiotics. Prednisone taper: #20 ( 10 mg) 4 x 2 days, 3 x 2 days, 2 x 2 days, 1 x 2 days and then stop. Delsym Cough Syrup 1-2 teaspoons every 12 hours as needed for cough CXR today, we will call with results. Return to work Monday if better. Letter for work. Follow up in 2 weeks to ensure resolution of mild infiltrate in LUL. Please contact office for sooner follow up if symptoms do not improve or worsen or seek emergency care

## 2015-06-19 NOTE — Patient Instructions (Addendum)
Use Delsym Cough Syrup for cough every 12 hours 1-2 teaspoons. We will start a prednisone taper today. Take as directed. Start a Z-Pack and take as directed and until gone. Eat yogurt with the antibiotic, or buy over the counter probiotics. We will check a chest x ray today and call you with results. Please contact office for sooner follow up if symptoms do not improve or worsen or seek emergency care. Return to work Monday if improved. We will provide you with a note for work.

## 2015-06-19 NOTE — Progress Notes (Signed)
Chart and office note reviewed in detail  > agree with a/p as outlined    

## 2015-06-20 NOTE — Progress Notes (Signed)
Quick Note:  Spoke with pt. Discussed cxr results and recs per SG. Pt verbalized understanding, is in agreement with plan, and voiced no further questions or concerns at this time. Pt is scheduled for f/u on 07/04/15 and is aware to call back sooner if symptoms worsen or do not improve. ______

## 2015-06-21 ENCOUNTER — Telehealth: Payer: Self-pay | Admitting: Internal Medicine

## 2015-06-21 NOTE — Telephone Encounter (Signed)
Spoke with pt, states that she was told she was ok to go back to work on Monday, wanted to make sure she is ok to be around people.  I advised that since she's been on an abx for 2 days already and will be finishing her zpak she should be fine to be around people at work.  Nothing further needed.

## 2015-06-24 ENCOUNTER — Ambulatory Visit (INDEPENDENT_AMBULATORY_CARE_PROVIDER_SITE_OTHER): Payer: BC Managed Care – PPO

## 2015-06-24 DIAGNOSIS — J309 Allergic rhinitis, unspecified: Secondary | ICD-10-CM | POA: Diagnosis not present

## 2015-07-01 ENCOUNTER — Ambulatory Visit (INDEPENDENT_AMBULATORY_CARE_PROVIDER_SITE_OTHER): Payer: BC Managed Care – PPO

## 2015-07-01 DIAGNOSIS — J309 Allergic rhinitis, unspecified: Secondary | ICD-10-CM | POA: Diagnosis not present

## 2015-07-04 ENCOUNTER — Ambulatory Visit: Payer: BC Managed Care – PPO | Admitting: Acute Care

## 2015-07-08 ENCOUNTER — Ambulatory Visit (INDEPENDENT_AMBULATORY_CARE_PROVIDER_SITE_OTHER): Payer: BC Managed Care – PPO

## 2015-07-08 DIAGNOSIS — J309 Allergic rhinitis, unspecified: Secondary | ICD-10-CM | POA: Diagnosis not present

## 2015-07-14 ENCOUNTER — Emergency Department (HOSPITAL_COMMUNITY): Payer: BC Managed Care – PPO

## 2015-07-14 ENCOUNTER — Encounter (HOSPITAL_COMMUNITY): Payer: Self-pay | Admitting: Emergency Medicine

## 2015-07-14 ENCOUNTER — Emergency Department (HOSPITAL_COMMUNITY)
Admission: EM | Admit: 2015-07-14 | Discharge: 2015-07-14 | Disposition: A | Discharge status: Home / Self Care | Payer: BC Managed Care – PPO | Attending: Emergency Medicine | Admitting: Emergency Medicine

## 2015-07-14 DIAGNOSIS — Z791 Long term (current) use of non-steroidal anti-inflammatories (NSAID): Secondary | ICD-10-CM | POA: Insufficient documentation

## 2015-07-14 DIAGNOSIS — Z8639 Personal history of other endocrine, nutritional and metabolic disease: Secondary | ICD-10-CM | POA: Diagnosis not present

## 2015-07-14 DIAGNOSIS — M545 Low back pain: Secondary | ICD-10-CM | POA: Diagnosis not present

## 2015-07-14 DIAGNOSIS — M1990 Unspecified osteoarthritis, unspecified site: Secondary | ICD-10-CM | POA: Diagnosis not present

## 2015-07-14 DIAGNOSIS — Z7951 Long term (current) use of inhaled steroids: Secondary | ICD-10-CM | POA: Insufficient documentation

## 2015-07-14 DIAGNOSIS — Z8701 Personal history of pneumonia (recurrent): Secondary | ICD-10-CM | POA: Insufficient documentation

## 2015-07-14 DIAGNOSIS — Z79899 Other long term (current) drug therapy: Secondary | ICD-10-CM | POA: Insufficient documentation

## 2015-07-14 DIAGNOSIS — R51 Headache: Secondary | ICD-10-CM

## 2015-07-14 DIAGNOSIS — Z87442 Personal history of urinary calculi: Secondary | ICD-10-CM | POA: Diagnosis not present

## 2015-07-14 DIAGNOSIS — J069 Acute upper respiratory infection, unspecified: Secondary | ICD-10-CM | POA: Diagnosis not present

## 2015-07-14 DIAGNOSIS — R0981 Nasal congestion: Secondary | ICD-10-CM

## 2015-07-14 DIAGNOSIS — R109 Unspecified abdominal pain: Secondary | ICD-10-CM | POA: Insufficient documentation

## 2015-07-14 DIAGNOSIS — Z792 Long term (current) use of antibiotics: Secondary | ICD-10-CM | POA: Insufficient documentation

## 2015-07-14 DIAGNOSIS — K219 Gastro-esophageal reflux disease without esophagitis: Secondary | ICD-10-CM | POA: Diagnosis not present

## 2015-07-14 DIAGNOSIS — Z7982 Long term (current) use of aspirin: Secondary | ICD-10-CM | POA: Diagnosis not present

## 2015-07-14 DIAGNOSIS — Z7952 Long term (current) use of systemic steroids: Secondary | ICD-10-CM | POA: Diagnosis not present

## 2015-07-14 DIAGNOSIS — R059 Cough, unspecified: Secondary | ICD-10-CM

## 2015-07-14 DIAGNOSIS — G8929 Other chronic pain: Secondary | ICD-10-CM | POA: Diagnosis not present

## 2015-07-14 DIAGNOSIS — R110 Nausea: Secondary | ICD-10-CM | POA: Insufficient documentation

## 2015-07-14 DIAGNOSIS — R519 Headache, unspecified: Secondary | ICD-10-CM

## 2015-07-14 DIAGNOSIS — M549 Dorsalgia, unspecified: Secondary | ICD-10-CM

## 2015-07-14 DIAGNOSIS — I10 Essential (primary) hypertension: Secondary | ICD-10-CM | POA: Insufficient documentation

## 2015-07-14 DIAGNOSIS — R509 Fever, unspecified: Secondary | ICD-10-CM

## 2015-07-14 DIAGNOSIS — R05 Cough: Secondary | ICD-10-CM | POA: Diagnosis not present

## 2015-07-14 LAB — BASIC METABOLIC PANEL
ANION GAP: 10 (ref 5–15)
BUN: 15 mg/dL (ref 6–20)
CALCIUM: 9.1 mg/dL (ref 8.9–10.3)
CHLORIDE: 107 mmol/L (ref 101–111)
CO2: 24 mmol/L (ref 22–32)
Creatinine, Ser: 0.94 mg/dL (ref 0.44–1.00)
GFR calc Af Amer: >60 mL/min (ref >60)
GFR calc non Af Amer: 58 mL/min — ABNORMAL LOW (ref >60)
GLUCOSE: 117 mg/dL — AB (ref 65–99)
POTASSIUM: 3.8 mmol/L (ref 3.5–5.1)
Sodium: 141 mmol/L (ref 135–145)

## 2015-07-14 LAB — INFLUENZA PANEL BY PCR (TYPE A & B)
H1N1FLUPCR: NOT DETECTED
INFLAPCR: POSITIVE — AB
INFLBPCR: NEGATIVE

## 2015-07-14 LAB — URINALYSIS, ROUTINE W REFLEX MICROSCOPIC
Bilirubin Urine: NEGATIVE
GLUCOSE, UA: NEGATIVE mg/dL
Hgb urine dipstick: NEGATIVE
Ketones, ur: 15 mg/dL — AB
LEUKOCYTES UA: NEGATIVE
NITRITE: NEGATIVE
PH: 5 (ref 5.0–8.0)
Protein, ur: NEGATIVE mg/dL
SPECIFIC GRAVITY, URINE: 1.021 (ref 1.005–1.030)

## 2015-07-14 LAB — CBC WITH DIFFERENTIAL/PLATELET
BASOS PCT: 0 %
Basophils Absolute: 0 10*3/uL (ref 0.0–0.1)
Eosinophils Absolute: 0.1 10*3/uL (ref 0.0–0.7)
Eosinophils Relative: 2 %
HCT: 38.5 % (ref 36.0–46.0)
Hemoglobin: 12.5 g/dL (ref 12.0–15.0)
LYMPHS ABS: 0.6 10*3/uL — AB (ref 0.7–4.0)
Lymphocytes Relative: 11 %
MCH: 30.1 pg (ref 26.0–34.0)
MCHC: 32.5 g/dL (ref 30.0–36.0)
MCV: 92.8 fL (ref 78.0–100.0)
MONOS PCT: 11 %
Monocytes Absolute: 0.6 10*3/uL (ref 0.1–1.0)
NEUTROS ABS: 4.1 10*3/uL (ref 1.7–7.7)
NEUTROS PCT: 76 %
Platelets: 180 10*3/uL (ref 150–400)
RBC: 4.15 MIL/uL (ref 3.87–5.11)
RDW: 13.5 % (ref 11.5–15.5)
WBC: 5.4 10*3/uL (ref 4.0–10.5)

## 2015-07-14 LAB — I-STAT CG4 LACTIC ACID, ED: Lactic Acid, Venous: 1.32 mmol/L (ref 0.5–2.0)

## 2015-07-14 MED ORDER — PREDNISONE 20 MG PO TABS: ORAL_TABLET | ORAL | Status: DC

## 2015-07-14 MED ORDER — ACETAMINOPHEN 325 MG PO TABS
650.0000 mg | ORAL_TABLET | Freq: Once | ORAL | Status: AC
  Administered 2015-07-14: 650 mg via ORAL
  Filled 2015-07-14: qty 2

## 2015-07-14 MED ORDER — BENZONATATE 200 MG PO CAPS: 200.0000 mg | ORAL_CAPSULE | Freq: Three times a day (TID) | ORAL | Status: DC | PRN

## 2015-07-14 MED ORDER — DOXYCYCLINE HYCLATE 100 MG PO CAPS: 100.0000 mg | ORAL_CAPSULE | Freq: Two times a day (BID) | ORAL | Status: DC

## 2015-07-14 MED ORDER — IBUPROFEN 400 MG PO TABS
400.0000 mg | ORAL_TABLET | Freq: Once | ORAL | Status: AC
  Administered 2015-07-14: 400 mg via ORAL
  Filled 2015-07-14: qty 1

## 2015-07-14 MED ORDER — OSELTAMIVIR PHOSPHATE 75 MG PO CAPS: 75.0000 mg | ORAL_CAPSULE | Freq: Two times a day (BID) | ORAL | Status: DC

## 2015-07-14 NOTE — ED Notes (Signed)
MD at bedside. 

## 2015-07-14 NOTE — ED Notes (Signed)
Pt ambulated, O2 96%-98% on RA, pt tolerated well.  PA aware

## 2015-07-14 NOTE — Discharge Instructions (Signed)
Continue to stay well-hydrated. Gargle warm salt water and spit it out. Continue to alternate between Tylenol and Ibuprofen for pain or fever. Use Mucinex for cough suppression/expectoration of mucus. Use netipot and nasocort to help with nasal congestion. May consider over-the-counter Benadryl or other antihistamine to decrease secretions and for watery itchy eyes. Take tessalon perles as needed for cough suppression. Take antibiotic as directed for possible bronchitis. Take prednisone as directed to help with symptoms. Followup with your primary care doctor in 5-7 days for recheck of ongoing symptoms. Return to emergency department for emergent changing or worsening of symptoms.   Cough, Adult A cough helps to clear your throat and lungs. A cough may last only 2-3 weeks (acute), or it may last longer than 8 weeks (chronic). Many different things can cause a cough. A cough may be a sign of an illness or another medical condition. HOME CARE  Pay attention to any changes in your cough.  Take medicines only as told by your doctor.  If you were prescribed an antibiotic medicine, take it as told by your doctor. Do not stop taking it even if you start to feel better.  Talk with your doctor before you try using a cough medicine.  Drink enough fluid to keep your pee (urine) clear or pale yellow.  If the air is dry, use a cold steam vaporizer or humidifier in your home.  Stay away from things that make you cough at work or at home.  If your cough is worse at night, try using extra pillows to raise your head up higher while you sleep.  Do not smoke, and try not to be around smoke. If you need help quitting, ask your doctor.  Do not have caffeine.  Do not drink alcohol.  Rest as needed. GET HELP IF:  You have new problems (symptoms).  You cough up yellow fluid (pus).  Your cough does not get better after 2-3 weeks, or your cough gets worse.  Medicine does not help your cough and you are  not sleeping well.  You have pain that gets worse or pain that is not helped with medicine.  You have a fever.  You are losing weight and you do not know why.  You have night sweats. GET HELP RIGHT AWAY IF:  You cough up blood.  You have trouble breathing.  Your heartbeat is very fast.   This information is not intended to replace advice given to you by your health care provider. Make sure you discuss any questions you have with your health care provider.   Document Released: 02/05/2011 Document Revised: 02/13/2015 Document Reviewed: 08/01/2014 Elsevier Interactive Patient Education 2016 [employer]  Sinus Headache A sinus headache happens when your sinuses become clogged or swollen. You may feel pain or pressure in your face, forehead, ears, or upper teeth. Sinus headaches can be mild or severe. HOME CARE  Take medicines only as told by your doctor.  If you were given an antibiotic medicine, finish all of it even if you start to feel better.  Use a nose spray if you feel stuffed up (congested).  If told, apply a warm, moist washcloth to your face to help lessen pain. GET HELP IF:  You get headaches more than one time each week.  Light or sound bothers you.  You have a fever.  You feel sick to your stomach (nauseous) or you throw up (vomit).  Your headaches do not get better with treatment. GET HELP RIGHT AWAY  IF:  You have trouble seeing.  You suddenly have very bad pain in your face or head.  You start to twitch or shake (seizure).  You are confused.  You have a stiff neck.   This information is not intended to replace advice given to you by your health care provider. Make sure you discuss any questions you have with your health care provider.   Document Released: 09/24/2010 Document Revised: 10/09/2014 Document Reviewed: 05/21/2014 Elsevier Interactive Patient Education 2016 [employer]  Upper Respiratory Infection, Adult Most upper respiratory  infections (URIs) are a viral infection of the air passages leading to the lungs. A URI affects the nose, throat, and upper air passages. The most common type of URI is nasopharyngitis and is typically referred to as "the common cold." URIs run their course and usually go away on their own. Most of the time, a URI does not require medical attention, but sometimes a bacterial infection in the upper airways can follow a viral infection. This is called a secondary infection. Sinus and middle ear infections are common types of secondary upper respiratory infections. Bacterial pneumonia can also complicate a URI. A URI can worsen asthma and chronic obstructive pulmonary disease (COPD). Sometimes, these complications can require emergency medical care and may be life threatening.  CAUSES Almost all URIs are caused by viruses. A virus is a type of germ and can spread from one person to another.  RISKS FACTORS You may be at risk for a URI if:   You smoke.   You have chronic heart or lung disease.  You have a weakened defense (immune) system.   You are very young or very old.   You have nasal allergies or asthma.  You work in crowded or poorly ventilated areas.  You work in health care facilities or schools. SIGNS AND SYMPTOMS  Symptoms typically develop 2-3 days after you come in contact with a cold virus. Most viral URIs last 7-10 days. However, viral URIs from the influenza virus (flu virus) can last 14-18 days and are typically more severe. Symptoms may include:   Runny or stuffy (congested) nose.   Sneezing.   Cough.   Sore throat.   Headache.   Fatigue.   Fever.   Loss of appetite.   Pain in your forehead, behind your eyes, and over your cheekbones (sinus pain).  Muscle aches.  DIAGNOSIS  Your health care provider may diagnose a URI by:  Physical exam.  Tests to check that your symptoms are not due to another condition such as:  Strep  throat.  Sinusitis.  Pneumonia.  Asthma. TREATMENT  A URI goes away on its own with time. It cannot be cured with medicines, but medicines may be prescribed or recommended to relieve symptoms. Medicines may help:  Reduce your fever.  Reduce your cough.  Relieve nasal congestion. HOME CARE INSTRUCTIONS   Take medicines only as directed by your health care provider.   Gargle warm saltwater or take cough drops to comfort your throat as directed by your health care provider.  Use a warm mist humidifier or inhale steam from a shower to increase air moisture. This may make it easier to breathe.  Drink enough fluid to keep your urine clear or pale yellow.   Eat soups and other clear broths and maintain good nutrition.   Rest as needed.   Return to work when your temperature has returned to normal or as your health care provider advises. You may need to stay  home longer to avoid infecting others. You can also use a face mask and careful hand washing to prevent spread of the virus.  Increase the usage of your inhaler if you have asthma.   Do not use any tobacco products, including cigarettes, chewing tobacco, or electronic cigarettes. If you need help quitting, ask your health care provider. PREVENTION  The best way to protect yourself from getting a cold is to practice good hygiene.   Avoid oral or hand contact with people with cold symptoms.   Wash your hands often if contact occurs.  There is no clear evidence that vitamin C, vitamin E, echinacea, or exercise reduces the chance of developing a cold. However, it is always recommended to get plenty of rest, exercise, and practice good nutrition.  SEEK MEDICAL CARE IF:   You are getting worse rather than better.   Your symptoms are not controlled by medicine.   You have chills.  You have worsening shortness of breath.  You have brown or red mucus.  You have yellow or brown nasal discharge.  You have pain in your  face, especially when you bend forward.  You have a fever.  You have swollen neck glands.  You have pain while swallowing.  You have white areas in the back of your throat. SEEK IMMEDIATE MEDICAL CARE IF:   You have severe or persistent:  Headache.  Ear pain.  Sinus pain.  Chest pain.  You have chronic lung disease and any of the following:  Wheezing.  Prolonged cough.  Coughing up blood.  A change in your usual mucus.  You have a stiff neck.  You have changes in your:  Vision.  Hearing.  Thinking.  Mood. MAKE SURE YOU:   Understand these instructions.  Will watch your condition.  Will get help right away if you are not doing well or get worse.   This information is not intended to replace advice given to you by your health care provider. Make sure you discuss any questions you have with your health care provider.   Document Released: 11/18/2000 Document Revised: 10/09/2014 Document Reviewed: 08/30/2013 Elsevier Interactive Patient Education [employer]

## 2015-07-14 NOTE — ED Notes (Signed)
Patient reports that she is experiencing severe headache, lower back pain, and cough which began Friday. Reports she had pneumonia approximately 2 weeks ago, but had finished the antibiotic treatment. Patient is febrile. Temp 100.0. Headache pain 10/10, as well as 10/10 lower back pain.

## 2015-07-14 NOTE — ED Notes (Signed)
Mercedes, PA at bedside at this time.  

## 2015-07-14 NOTE — ED Provider Notes (Signed)
CSN: [id number]     Arrival date & time 07/14/15  0607 History   First MD Initiated Contact with Patient 07/14/15 [phone]     Chief Complaint  Patient presents with  . Headache  . Back Pain  . Cough     (Consider location/radiation/quality/duration/timing/severity/associated sxs/prior Treatment) HPI Comments: Catherine Hoffman is a 75 y.o. female with a PMHx of HTN, chronic headaches, chronic cough/allergies (followed by Dr. [name] of [facility]), chronic back pain, HLD, arthritis, GERD, and nephrolithiasis, who presents to the ED with complaints of cough with white sputum production, back pain, and headache 2 days. She states these are similar symptoms to when she had pneumonia last month. Chart review reveals that on 06/19/15 she was seen at [facility] pulmonology for the same symptoms, acute worsening cough, body aches, HA, and fever, subsequently treated on zpack and prednisone. 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, and believes that she has gotten sick again, stating there have been multiple +sick contacts. She states her headache was gradual onset yesterday, and describes it as 10/10 constant frontal throbbing nonradiating pain with no known aggravating factors and improved with Nasacort, stating that this feels very similar to when she gets sinus congestion headaches. Associated symptoms include intermittent wheezing, fever with Tmax 100, intermittent nausea (currently resolved), intermittent abd pain with coughing, and white rhinorrhea.   She denies neck pain/stiffness, CP, SOB, LE swelling, vision changes, lightheadedness, sore throat, ear pain/drainage, hearing loss, vomiting, diarrhea, constipation, hematuria, dysuria, incontinence of urine/stool, cauda equina symptoms or saddle anesthesia, myalgias, arthralgias, numbness, tingling, weakness, or rashes.   Patient is a 74 y.o. female presenting with cough. The  history is provided by the patient and medical records. No language interpreter was used.  Cough Cough characteristics:  Productive Sputum characteristics:  White Severity:  Moderate Onset quality:  Gradual Duration:  2 days Timing:  Constant Progression:  Unchanged Chronicity:  Recurrent Smoker: no   Context: sick contacts   Relieved by: nasocort. Worsened by:  Nothing tried Ineffective treatments:  None tried Associated symptoms: fever (Tmax 100), headaches, rhinorrhea, sinus congestion and wheezing   Associated symptoms: no chest pain, no ear pain, no myalgias, no shortness of breath and no sore throat   Risk factors: recent infection   Risk factors: no recent travel     Past Medical History  Diagnosis Date  . Esophageal reflux   . Hypertension   . Episodic tension type headache   . Hyperlipidemia   . Arthritis   . Left rotator cuff tear   . Kidney stone on left side Hx-date unknown  . Internal hemorrhoids   . GERD (gastroesophageal reflux disease)    Past Surgical History  Procedure Laterality Date  . Total abdominal hysterectomy w/ bilateral salpingoophorectomy  1990's  . Knee arthroscopy Left 2004  . Shoulder open rotator cuff repair Left 08/30/2012    Procedure: ROTATOR CUFF REPAIR SHOULDER OPEN, POSSIBLE GRAFT AND ANCHORS;  Surgeon: Jacki Cones, MD;  Location: [facility] [place];  Service: Orthopedics;  Laterality: Left;  . Colonoscopy  11/20/2008   Family History  Problem Relation Age of Onset  . Hypertension Mother   . Diabetes Mother   . Cancer Mother     in leg  . Colon cancer Neg Hx    Social History  Substance Use Topics  . Smoking status: Never Smoker   . Smokeless tobacco: Never Used  .  Alcohol Use: No   OB History    No data available     Review of Systems  Constitutional: Positive for fever (Tmax 100).  HENT: Positive for congestion, rhinorrhea and sinus pressure. Negative for ear discharge, ear pain, hearing loss, sore throat  and trouble swallowing.   Eyes: Negative for visual disturbance.  Respiratory: Positive for cough and wheezing. Negative for shortness of breath.   Cardiovascular: Negative for chest pain and leg swelling.  Gastrointestinal: Positive for nausea (intermittent) and abdominal pain (intermittent, from coughing). Negative for vomiting, diarrhea and constipation.  Genitourinary: Negative for dysuria and hematuria.  Musculoskeletal: Positive for back pain. Negative for myalgias, arthralgias, neck pain and neck stiffness.  Skin: Negative for color change.  Allergic/Immunologic: Negative for immunocompromised state.  Neurological: Positive for headaches. Negative for syncope, weakness, light-headedness and numbness.  Psychiatric/Behavioral: Negative for confusion.   10 Systems reviewed and are negative for acute change except as noted in the HPI.    Allergies  Hydrocodone-acetaminophen and Tramadol  Home Medications   Prior to Admission medications   Medication Sig Start Date End Date Taking? Authorizing Provider  aspirin 81 MG tablet Take 81 mg by mouth daily.      Historical Provider, MD  azelastine (ASTELIN) 0.1 % nasal spray Place 2 sprays into both nostrils daily.    Historical Provider, MD  azithromycin (ZITHROMAX) 250 MG tablet Take as directed 06/19/15   Bevelyn Ngo, NP  calcium-vitamin D (OSCAL WITH D) 500-200 MG-UNIT per tablet Take 1 tablet by mouth 2 (two) times daily.      Historical Provider, MD  cetirizine (ZYRTEC) 10 MG tablet Take in am daily as needed for drippy nose 03/28/13   Nyoka Cowden, MD  dextromethorphan [facility]) 30 MG/5ML liquid Reported on 06/06/2015    Historical Provider, MD  docusate sodium (COLACE) 100 MG capsule Reported on 06/06/2015    Historical Provider, MD  estradiol (ESTRACE) 0.5 MG tablet Take 0.5 mg by mouth daily.    Historical Provider, MD  fluticasone (FLONASE) 50 MCG/ACT nasal spray Place 2 sprays into both nostrils daily.    Historical Provider,  MD  latanoprost (XALATAN) 0.005 % ophthalmic solution Place 1 drop into both eyes at bedtime.    Historical Provider, MD  meclizine (ANTIVERT) 25 MG tablet Take 1 tablet (25 mg total) by mouth every 8 (eight) hours as needed for dizziness. 11/26/14   Tammy S Parrett, NP  meloxicam (MOBIC) 7.5 MG tablet Take 1 tablet (7.5 mg total) by mouth daily. 06/06/15   Myrlene Broker, MD  methocarbamol (ROBAXIN) 500 MG tablet Take 1 tablet (500 mg total) by mouth every 6 (six) hours as needed. 08/31/12   Dimitri Ped, PA-C  NONFORMULARY OR COMPOUNDED ITEM Allergy Vaccine 1:500 Given at [facility] Pulmonary    Historical Provider, MD  omeprazole (PRILOSEC) 40 MG capsule TAKE ONE CAPSULE BY MOUTH 30 MINUTES BEFORE FIRST AND LAST MEAL OF THE DAY 11/28/14   Nyoka Cowden, MD  oxyCODONE-acetaminophen (PERCOCET/ROXICET) 5-325 MG per tablet Take 1 tablet by mouth every 4 (four) hours as needed. (this has tylenol in it) 03/28/13   Nyoka Cowden, MD  oxymetazoline (AFRIN) 0.05 % nasal spray Place 2 sprays into both nostrils 2 (two) times daily as needed (for 5 days for nasal stuffiness).    Historical Provider, MD  potassium chloride SA (K-DUR,KLOR-CON) 20 MEQ tablet TAKE 1 TABLET BY MOUTH EVERY DAY 09/18/14   Waymon Budge, MD  predniSONE (DELTASONE) 10 MG tablet  Take 4 tablets x 2 days, 3 x 2 days, 2 x 2 days, 1 x 2 days then stop 06/19/15   Bevelyn Ngo, NP  promethazine (PHENERGAN) 25 MG tablet Reported on 06/06/2015    Historical Provider, MD  spironolactone (ALDACTONE) 25 MG tablet TAKE 1 TABLET BY MOUTH TWICE DAILY 09/14/14   Nyoka Cowden, MD  traZODone (DESYREL) 50 MG tablet 1-2 at bedtime as needed for trouble sleeping    Historical Provider, MD   BP 109/82 mmHg  Pulse 79  Temp(Src) 100 F (37.8 C) (Oral)  Resp 22  Ht  (1.575 m)  Wt 85.73 kg  BMI 34.56 kg/m2  SpO2 100% Physical Exam  Constitutional: She is oriented to person, place, and time. Vital signs are normal. She appears  well-developed and well-nourished.  Non-toxic appearance. No distress.  Temp 100.44F, nontoxic, NAD  HENT:  Head: Normocephalic and atraumatic.  Nose: Mucosal edema and rhinorrhea present. Right sinus exhibits frontal sinus tenderness. Left sinus exhibits frontal sinus tenderness.  Mouth/Throat: Uvula is midline, oropharynx is clear and moist and mucous membranes are normal. No trismus in the jaw. No uvula swelling.  Nose with mucosal edema and rhinorrhea, b/l frontal sinus TTP. Oropharynx clear and moist, without uvular swelling or deviation, no trismus or drooling, no tonsillar swelling or erythema, no exudates.    Eyes: Conjunctivae and EOM are normal. Pupils are equal, round, and reactive to light. Right eye exhibits no discharge. Left eye exhibits no discharge.  PERRL, EOMI, no nystagmus, no visual field deficits   Neck: Normal range of motion. Neck supple. No spinous process tenderness and no muscular tenderness present. No rigidity. Normal range of motion present. No Brudzinski's sign and no Kernig's sign noted.  FROM intact without spinous process TTP, no bony stepoffs or deformities, no paraspinous muscle TTP or muscle spasms. No rigidity or meningeal signs. No bruising or swelling.   Cardiovascular: Normal rate, regular rhythm, normal heart sounds and intact distal pulses.  Exam reveals no gallop and no friction rub.   No murmur heard. RRR, nl s1/s2, no m/r/g, distal pulses intact, no pedal edema   Pulmonary/Chest: Effort normal. No respiratory distress. She has decreased breath sounds in the left lower field. She has no wheezes. She has no rhonchi. She has no rales.  Mildly diminished sounds in LLF although difficult to assess due to pt coughing throughout exam limiting her ability to fully inspire until she stopped coughing, no w/r/r, no hypoxia or increased WOB, speaking in full sentences except when coughing, SpO2 98% on RA   Abdominal: Soft. Normal appearance and bowel sounds are  normal. She exhibits no distension. There is no tenderness. There is no rigidity, no rebound, no guarding, no CVA tenderness, no tenderness at McBurney's point and negative Murphy's sign.  Musculoskeletal: Normal range of motion.  MAE x4 Strength and sensation grossly intact Distal pulses intact No pedal edema Lumbar spine with FROM intact without spinous process TTP, no bony stepoffs or deformities, mild diffuse b/l paraspinous muscle TTP without muscle spasms. Strength 5/5 in all extremities, sensation grossly intact in all extremities, negative SLR bilaterally, gait steady and nonantalgic. No overlying skin changes.   Neurological: She is alert and oriented to person, place, and time. She has normal strength. No cranial nerve deficit or sensory deficit. Coordination and gait normal. GCS eye subscore is 4. GCS verbal subscore is 5. GCS motor subscore is 6.  CN 2-12 grossly intact A&O x4 GCS 15 Sensation and strength intact  Gait nonataxic Coordination with finger-to-nose WNL Neg pronator drift   Skin: Skin is warm, dry and intact. No rash noted.  Psychiatric: She has a normal mood and affect.  Nursing note and vitals reviewed.   ED Course  Procedures (including critical care time) Labs Review Labs Reviewed  CBC WITH DIFFERENTIAL/PLATELET - Abnormal; Notable for the following:    Lymphs Abs 0.6 (*)    All other components within normal limits  BASIC METABOLIC PANEL - Abnormal; Notable for the following:    Glucose, Bld 117 (*)    GFR calc non Af Amer 58 (*)    All other components within normal limits  URINALYSIS, ROUTINE W REFLEX MICROSCOPIC (NOT AT [facility]) - Abnormal; Notable for the following:    APPearance HAZY (*)    Ketones, ur 15 (*)    All other components within normal limits  INFLUENZA PANEL BY PCR (TYPE A & B, H1N1) - Abnormal; Notable for the following:    Influenza A By PCR POSITIVE (*)    All other components within normal limits  CULTURE, BLOOD (ROUTINE X 2)   CULTURE, BLOOD (ROUTINE X 2)  I-STAT CG4 LACTIC ACID, ED  I-STAT CG4 LACTIC ACID, ED    Imaging Review Dg Chest 2 View  07/14/2015  CLINICAL DATA:  Productive cough, fever. EXAM: CHEST  2 VIEW COMPARISON:  June 19, 2015. FINDINGS: Stable cardiomegaly. No pneumothorax or pleural effusion is noted. Stable mild bibasilar subsegmental atelectasis or scarring is noted. Bony thorax is intact. IMPRESSION: Stable bibasilar subsegmental atelectasis or scarring. Electronically Signed   By: Lupita Raider, M.D.   On: 07/14/2015 08:06   I have personally reviewed and evaluated these images and lab results as part of my medical decision-making.   EKG Interpretation   Date/Time:  Sunday July 14 2015 07:58:49 EST Ventricular Rate:  74 PR Interval:  148 QRS Duration: 87 QT Interval:  381 QTC Calculation: 423 R Axis:   51 Text Interpretation:  Sinus rhythm Consider anterior infarct No previous  ECGs available Confirmed by Manus Gunning  MD, STEPHEN [phone]) on 07/14/2015  8:05:36 AM      MDM   Final diagnoses:  Cough  Fever, unspecified fever cause  URI (upper respiratory infection)  Sinus headache  Sinus congestion  Chronic back pain    75 y.o. female here with cough, back pain, and HA x2 days, +sick contacts. No meningismus, no focal neuro deficits. +Sinus congestion and tenderness, likely where the headache is coming from. LLL with mildly diminished sounds, although difficult to assess due to persisting cough during exam. No wheezing. Pt requesting tylenol, declines wanting anything else. Will get EKG and CXR, overall pt appears rather well and does not have any other concerning SIRS-type vital signs and doesn't appear septic/ill. Discussed case with my attending Dr Manus Gunning who would like to get basic labs and U/A as well, and who will see the pt now. Doubt meningitis or other secondary cause of headache. No midline spinal tenderness, extremities NVI and steady gait, doubt need for imaging of  back. Will reassess shortly.   9:59 AM Rectal temp showed fever 101.8, but lactic acid WNL, CBC w/diff unremarkable, which is reassuring that it's not sepsis. BMP WNL. CXR without acute findings, EKG with Q waves in V1-2 but no prior to compare and no acute ischemic changes. Ambulated without desats, tolerating well. Oral temp rechecked and still 101, will give ibuprofen. U/A pending. Sent flu swab, which is pending. Will await these then reassess  shortly. Pt ambulated without desats or difficulty  10:25 AM U/A neg. Will cover pt for bronchitis, will start on doxy. Will start on short steroid burst. Pt requesting tessalon perles for cough suppression. Discussed that this could still be viral, but pt is outside or 72hr window for tamiflu, so it may just need to run its course, but covering for possible bacterial bronchitis is warranted. F/up with PCP in 3-5 days. Discussed OTC meds for symptoms. I explained the diagnosis and have given explicit precautions to return to the ER including for any other new or worsening symptoms. The patient understands and accepts the medical plan as it's been dictated and I have answered their questions. Discharge instructions concerning home care and prescriptions have been given. The patient is STABLE and is discharged to home in good condition.  BP 112/64 mmHg  Pulse 92  Temp(Src) 101 F (38.3 C) (Oral)  Resp 19  Ht  (1.575 m)  Wt 85.73 kg  BMI 34.56 kg/m2  SpO2 97%  Meds ordered this encounter  Medications  . acetaminophen (TYLENOL) tablet 650 mg    Sig:   . ibuprofen (ADVIL,MOTRIN) tablet 400 mg    Sig:   . doxycycline (VIBRAMYCIN) 100 MG capsule    Sig: Take 1 capsule (100 mg total) by mouth 2 (two) times daily. One po bid x 7 days    Dispense:  14 capsule    Refill:  0    Order Specific Question:  Supervising Provider    Answer:  MILLER, BRIAN [[id number]]  . predniSONE (DELTASONE) 20 MG tablet    Sig: 3 tabs po daily x 4 days    Dispense:  12  tablet    Refill:  0    Order Specific Question:  Supervising Provider    Answer:  MILLER, BRIAN [[id number]]  . benzonatate (TESSALON) 200 MG capsule    Sig: Take 1 capsule (200 mg total) by mouth 3 (three) times daily as needed for cough.    Dispense:  9 capsule    Refill:  0    Order Specific Question:  Supervising Provider    Answer:  Eber Hong [[id number]]     Catherine Rosamond Camprubi-Soms, PA-C 07/14/15 1042  Glynn Octave, MD 07/14/15 [phone]

## 2015-07-15 ENCOUNTER — Ambulatory Visit: Payer: BC Managed Care – PPO

## 2015-07-19 ENCOUNTER — Encounter: Payer: Self-pay | Admitting: Internal Medicine

## 2015-07-19 ENCOUNTER — Ambulatory Visit (INDEPENDENT_AMBULATORY_CARE_PROVIDER_SITE_OTHER): Payer: BC Managed Care – PPO | Admitting: Internal Medicine

## 2015-07-19 VITALS — BP 110/70 | HR 59 | Temp 97.5°F | Ht 62.0 in | Wt 187.0 lb

## 2015-07-19 DIAGNOSIS — R062 Wheezing: Secondary | ICD-10-CM | POA: Diagnosis not present

## 2015-07-19 DIAGNOSIS — R059 Cough, unspecified: Secondary | ICD-10-CM

## 2015-07-19 DIAGNOSIS — I10 Essential (primary) hypertension: Secondary | ICD-10-CM

## 2015-07-19 DIAGNOSIS — R05 Cough: Secondary | ICD-10-CM

## 2015-07-19 DIAGNOSIS — J4520 Mild intermittent asthma, uncomplicated: Secondary | ICD-10-CM | POA: Insufficient documentation

## 2015-07-19 LAB — CULTURE, BLOOD (ROUTINE X 2)
CULTURE: NO GROWTH
Culture: NO GROWTH

## 2015-07-19 MED ORDER — PREDNISONE 10 MG PO TABS: ORAL_TABLET | ORAL | Status: DC

## 2015-07-19 MED ORDER — HYDROCODONE-HOMATROPINE 5-1.5 MG/5ML PO SYRP: 5.0000 mL | ORAL_SOLUTION | Freq: Four times a day (QID) | ORAL | Status: DC | PRN

## 2015-07-19 MED ORDER — METHYLPREDNISOLONE ACETATE 80 MG/ML IJ SUSP
80.0000 mg | Freq: Once | INTRAMUSCULAR | Status: AC
  Administered 2015-07-19: 80 mg via INTRAMUSCULAR

## 2015-07-19 MED ORDER — LEVOFLOXACIN 250 MG PO TABS: 250.0000 mg | ORAL_TABLET | Freq: Every day | ORAL | Status: DC

## 2015-07-19 NOTE — Assessment & Plan Note (Signed)
.  Mild to mod, for depomedrol IM, predpac asd, Albut MDI prn, note for work,  to f/u any worsening symptoms or concerns

## 2015-07-19 NOTE — Assessment & Plan Note (Signed)
Mild to mod acute, finished recent tx with tamiflu, cough persists now scant prod - ? Superinfection, c/w bronchitis vs pna, for antibx course,  Cough med prn, declines cxr , to f/u any worsening symptoms or concerns

## 2015-07-19 NOTE — Assessment & Plan Note (Signed)
stable overall by history and exam, recent data reviewed with pt, and pt to continue medical treatment as before,  to f/u any worsening symptoms or concerns BP Readings from Last 3 Encounters:  07/19/15 110/70  07/14/15 118/64  06/19/15 124/82

## 2015-07-19 NOTE — Progress Notes (Signed)
Subjective:    Patient ID: Catherine Hoffman, female    DOB: 09-24-1940, 75 y.o.   MRN: [id number]  HPI  Here to f/u after ER visit with: feb 5  - 2 days of cough, bodyaches, headache, fever. No CP. Some abdominal pain from coughing.  Nontoxic. Rhinorhea. No meningismus. Frequent dry cough. CTAB IVF,CXR CXR neg. Tolerating fluids in the ED and ambulatory without desaturation.  Treat for bronchitis and influenza with tamiflu.  Unfortunately since then has developed worsening wheezing/sob though Pt denies chest pain,  orthopnea, PND, increased LE swelling, palpitations, dizziness or syncope. Not sure about fever, but has felt warm.  Cough overall minimally productive, not sure of color. Has not had to use inhalers in the past Past Medical History  Diagnosis Date  . Esophageal reflux   . Hypertension   . Episodic tension type headache   . Hyperlipidemia   . Arthritis   . Left rotator cuff tear   . Kidney stone on left side Hx-date unknown  . Internal hemorrhoids   . GERD (gastroesophageal reflux disease)    Past Surgical History  Procedure Laterality Date  . Total abdominal hysterectomy w/ bilateral salpingoophorectomy  1990's  . Knee arthroscopy Left 2004  . Shoulder open rotator cuff repair Left 08/30/2012    Procedure: ROTATOR CUFF REPAIR SHOULDER OPEN, POSSIBLE GRAFT AND ANCHORS;  Surgeon: Jacki Cones, MD;  Location: [facility] [place];  Service: Orthopedics;  Laterality: Left;  . Colonoscopy  11/20/2008    reports that she has never smoked. She has never used smokeless tobacco. She reports that she does not drink alcohol or use illicit drugs. family history includes Cancer in her mother; Diabetes in her mother; Hypertension in her mother. There is no history of Colon cancer. Allergies  Allergen Reactions  . Hydrocodone-Acetaminophen     REACTION: GI upset  . Tramadol Itching   Current Outpatient Prescriptions on File Prior to Visit  Medication Sig Dispense  Refill  . aspirin 81 MG tablet Take 81 mg by mouth daily.      . benzonatate (TESSALON) 200 MG capsule Take 1 capsule (200 mg total) by mouth 3 (three) times daily as needed for cough. 9 capsule 0  . calcium-vitamin D (OSCAL WITH D) 500-200 MG-UNIT per tablet Take 1 tablet by mouth daily with breakfast.     . doxycycline (VIBRAMYCIN) 100 MG capsule Take 1 capsule (100 mg total) by mouth 2 (two) times daily. One po bid x 7 days 14 capsule 0  . estradiol (ESTRACE) 0.5 MG tablet Take 0.5 mg by mouth daily.    Marland Kitchen latanoprost (XALATAN) 0.005 % ophthalmic solution Place 1 drop into both eyes at bedtime.    . meclizine (ANTIVERT) 25 MG tablet Take 1 tablet (25 mg total) by mouth every 8 (eight) hours as needed for dizziness. 30 tablet 0  . meloxicam (MOBIC) 7.5 MG tablet Take 1 tablet (7.5 mg total) by mouth daily. 90 tablet 1  . methocarbamol (ROBAXIN) 500 MG tablet Take 1 tablet (500 mg total) by mouth every 6 (six) hours as needed. (Patient taking differently: Take 500 mg by mouth every 6 (six) hours as needed for muscle spasms. ) 40 tablet 1  . omeprazole (PRILOSEC) 40 MG capsule TAKE ONE CAPSULE BY MOUTH 30 MINUTES BEFORE FIRST AND LAST MEAL OF THE DAY 180 capsule 0  . oseltamivir (TAMIFLU) 75 MG capsule Take 1 capsule (75 mg total) by mouth every 12 (twelve) hours. X 5 days 10 capsule 0  .  oxyCODONE-acetaminophen (PERCOCET/ROXICET) 5-325 MG per tablet Take 1 tablet by mouth every 4 (four) hours as needed. (this has tylenol in it) (Patient taking differently: Take 1 tablet by mouth every 4 (four) hours as needed for moderate pain. (this has tylenol in it)) 60 tablet 0  . potassium chloride SA (K-DUR,KLOR-CON) 20 MEQ tablet TAKE 1 TABLET BY MOUTH EVERY DAY 30 tablet 0  . predniSONE (DELTASONE) 20 MG tablet 3 tabs po daily x 4 days 12 tablet 0  . spironolactone (ALDACTONE) 25 MG tablet TAKE 1 TABLET BY MOUTH TWICE DAILY 180 tablet 1   No current facility-administered medications on file prior to visit.     Review of Systems  Constitutional: Negative for unusual diaphoresis or night sweats HENT: Negative for ringing in ear or discharge Eyes: Negative for double vision or worsening visual disturbance.  Respiratory: Negative for choking and stridor.   Gastrointestinal: Negative for vomiting or other signifcant bowel change Genitourinary: Negative for hematuria or change in urine volume.  Musculoskeletal: Negative for other MSK pain or swelling Skin: Negative for color change and worsening wound.  Neurological: Negative for tremors and numbness other than noted  Psychiatric/Behavioral: Negative for decreased concentration or agitation other than above       Objective:   Physical Exam BP 110/70 mmHg  Pulse 59  Temp(Src) 97.5 F (36.4 C) (Oral)  Ht  (1.575 m)  Wt 187 lb (84.823 kg)  BMI 34.19 kg/m2  SpO2 97% VS noted, mild ill Constitutional: Pt appears in no significant distress HENT: Head: NCAT.  Right Ear: External ear normal.  Left Ear: External ear normal.  Bilat tm's with mild erythema.  Max sinus areas  non tender.  Pharynx with mild erythema, no exudate Eyes: . Pupils are equal, round, and reactive to light. Conjunctivae and EOM are normal Neck: Normal range of motion. Neck supple.  Cardiovascular: Normal rate and regular rhythm.   Pulmonary/Chest: Effort normal and breath sounds decreased  without rales but with bilat mild wheezing.  Neurological: Pt is alert. Not confused , motor grossly intact Skin: Skin is warm. No rash, no LE edema Psychiatric: Pt behavior is normal. No agitation.     Assessment & Plan:

## 2015-07-19 NOTE — Patient Instructions (Addendum)
You had the steroid shot today  Please take all new medication as prescribed  - the antibiotic, cough medicine (which is low dose hydrocodone and should be ok for you), as well as the prednisone  Please continue all other medications as before, and refills have been done if requested.  Please have the pharmacy call with any other refills you may need.  Please keep your appointments with your specialists as you may have planned  Please return in 1 week to Dr Okey Dupre, or sooner if needed

## 2015-07-19 NOTE — Progress Notes (Signed)
Pre visit review using our clinic review tool, if applicable. No additional management support is needed unless otherwise documented below in the visit note. 

## 2015-07-19 NOTE — Addendum Note (Signed)
Addended by: Radford Pax M on: 07/19/2015 04:07 PM   Modules accepted: Orders

## 2015-07-22 ENCOUNTER — Ambulatory Visit (INDEPENDENT_AMBULATORY_CARE_PROVIDER_SITE_OTHER): Payer: BC Managed Care – PPO

## 2015-07-22 ENCOUNTER — Telehealth: Payer: Self-pay | Admitting: Internal Medicine

## 2015-07-22 DIAGNOSIS — J309 Allergic rhinitis, unspecified: Secondary | ICD-10-CM | POA: Diagnosis not present

## 2015-07-22 NOTE — Telephone Encounter (Signed)
Pt was not over in allergy any longer.  LMOMTCB x1 for pt

## 2015-07-23 NOTE — Telephone Encounter (Signed)
LM for patient x 2 

## 2015-07-24 NOTE — Telephone Encounter (Signed)
lmtcb x3 for pt. 

## 2015-07-25 NOTE — Telephone Encounter (Signed)
Called pt and LMTCB on named VM on cell Called pt home #. Pt reports she is having a lot of wheezing (worse at night), prod cough (yellow phlem), chest tx x couple days. She saw Dr. Jonny Ruiz 07/19/15 and was rx'd levaquin, prednisone and hycodan cough syrup. Reports this has helped but was told to call our office to get an inhaler that may also her with her symptoms. Pt reports she is not on any inhalers. Please advise Dr. Maple Hudson thanks  Allergies  Allergen Reactions  . Hydrocodone-Acetaminophen     REACTION: GI upset  . Tramadol Itching    Current Outpatient Prescriptions on File Prior to Visit  Medication Sig Dispense Refill  . aspirin 81 MG tablet Take 81 mg by mouth daily.      . benzonatate (TESSALON) 200 MG capsule Take 1 capsule (200 mg total) by mouth 3 (three) times daily as needed for cough. 9 capsule 0  . calcium-vitamin D (OSCAL WITH D) 500-200 MG-UNIT per tablet Take 1 tablet by mouth daily with breakfast.     . doxycycline (VIBRAMYCIN) 100 MG capsule Take 1 capsule (100 mg total) by mouth 2 (two) times daily. One po bid x 7 days 14 capsule 0  . estradiol (ESTRACE) 0.5 MG tablet Take 0.5 mg by mouth daily.    Marland Kitchen HYDROcodone-homatropine (HYCODAN) 5-1.5 MG/5ML syrup Take 5 mLs by mouth every 6 (six) hours as needed for cough. 180 mL 0  . latanoprost (XALATAN) 0.005 % ophthalmic solution Place 1 drop into both eyes at bedtime.    Marland Kitchen levofloxacin (LEVAQUIN) 250 MG tablet Take 1 tablet (250 mg total) by mouth daily. 10 tablet 0  . meclizine (ANTIVERT) 25 MG tablet Take 1 tablet (25 mg total) by mouth every 8 (eight) hours as needed for dizziness. 30 tablet 0  . meloxicam (MOBIC) 7.5 MG tablet Take 1 tablet (7.5 mg total) by mouth daily. 90 tablet 1  . methocarbamol (ROBAXIN) 500 MG tablet Take 1 tablet (500 mg total) by mouth every 6 (six) hours as needed. (Patient taking differently: Take 500 mg by mouth every 6 (six) hours as needed for muscle spasms. ) 40 tablet 1  . omeprazole  (PRILOSEC) 40 MG capsule TAKE ONE CAPSULE BY MOUTH 30 MINUTES BEFORE FIRST AND LAST MEAL OF THE DAY 180 capsule 0  . oseltamivir (TAMIFLU) 75 MG capsule Take 1 capsule (75 mg total) by mouth every 12 (twelve) hours. X 5 days 10 capsule 0  . oxyCODONE-acetaminophen (PERCOCET/ROXICET) 5-325 MG per tablet Take 1 tablet by mouth every 4 (four) hours as needed. (this has tylenol in it) (Patient taking differently: Take 1 tablet by mouth every 4 (four) hours as needed for moderate pain. (this has tylenol in it)) 60 tablet 0  . potassium chloride SA (K-DUR,KLOR-CON) 20 MEQ tablet TAKE 1 TABLET BY MOUTH EVERY DAY 30 tablet 0  . predniSONE (DELTASONE) 10 MG tablet 3 tabs by mouth per day for 3 days,2tabs per day for 3 days,1tab per day for 3 days 18 tablet 0  . spironolactone (ALDACTONE) 25 MG tablet TAKE 1 TABLET BY MOUTH TWICE DAILY 180 tablet 1   No current facility-administered medications on file prior to visit.

## 2015-07-25 NOTE — Telephone Encounter (Signed)
Called and spoke with the pt. Reviewed CY's recs. I informed her that samples are ready for pick up today. She stated that she had an ov with Dr. Yetta Barre on 07/26/15 and will pick them up tomorrow. Pt voiced understanding and had no further questions. Samples have been placed at the front office. Nothing further needed.

## 2015-07-25 NOTE — Telephone Encounter (Signed)
Offer sample Breo Ellipta 200,  Inhale 1 puff, once daily, then rinse mouth well.    If she is till having problems afdter she uses this sample up, please let us know.

## 2015-07-26 ENCOUNTER — Encounter: Payer: Self-pay | Admitting: Internal Medicine

## 2015-07-26 ENCOUNTER — Ambulatory Visit (INDEPENDENT_AMBULATORY_CARE_PROVIDER_SITE_OTHER): Payer: BC Managed Care – PPO | Admitting: Internal Medicine

## 2015-07-26 VITALS — BP 132/82 | HR 69 | Temp 98.0°F | Resp 20 | Wt 183.0 lb

## 2015-07-26 DIAGNOSIS — R062 Wheezing: Secondary | ICD-10-CM

## 2015-07-26 DIAGNOSIS — R05 Cough: Secondary | ICD-10-CM

## 2015-07-26 DIAGNOSIS — R059 Cough, unspecified: Secondary | ICD-10-CM

## 2015-07-26 DIAGNOSIS — Z23 Encounter for immunization: Secondary | ICD-10-CM

## 2015-07-26 DIAGNOSIS — I10 Essential (primary) hypertension: Secondary | ICD-10-CM | POA: Diagnosis not present

## 2015-07-26 NOTE — Patient Instructions (Addendum)
You had the pneumovax shot today  You are given the work note today  Please use the Breo sample as you have per Dr Maple Hudson  Please continue all other medications as before, and refills have been done if requested.  Please have the pharmacy call with any other refills you may need..  Please keep your appointments with your specialists as you may have planned  Please return in 6 months, or sooner if needed, to Dr Okey Dupre

## 2015-07-26 NOTE — Progress Notes (Signed)
Subjective:    Patient ID: Catherine Hoffman, female    DOB: 12-24-1940, 75 y.o.   MRN: [id number]  HPI  Here for 1 wk f/u, to me instead of PCP (Dr Okey Dupre) due to no appt's with Dr C; has sample of breo from Dr Maple Hudson to try but just picked up this am; overall doing well  - Pt denies chest pain, increased sob or doe, wheezing, orthopnea, PND, increased LE swelling, palpitations, dizziness or syncope.  Pt denies new neurological symptoms such as new headache, or facial or extremity weakness or numbness  Pt denies polydipsia, polyuria.  Had recent mild hyperglycemia with illness.   Pt denies fever, wt loss, night sweats, loss of appetite, or other constitutional symptoms  Asks for note for work.  Due for Pneumovax, o/w plans to f/u with PCP Past Medical History  Diagnosis Date  . Esophageal reflux   . Hypertension   . Episodic tension type headache   . Hyperlipidemia   . Arthritis   . Left rotator cuff tear   . Kidney stone on left side Hx-date unknown  . Internal hemorrhoids   . GERD (gastroesophageal reflux disease)    Past Surgical History  Procedure Laterality Date  . Total abdominal hysterectomy w/ bilateral salpingoophorectomy  1990's  . Knee arthroscopy Left 2004  . Shoulder open rotator cuff repair Left 08/30/2012    Procedure: ROTATOR CUFF REPAIR SHOULDER OPEN, POSSIBLE GRAFT AND ANCHORS;  Surgeon: Jacki Cones, MD;  Location: [facility];  Service: Orthopedics;  Laterality: Left;  . Colonoscopy  11/20/2008    reports that she has never smoked. She has never used smokeless tobacco. She reports that she does not drink alcohol or use illicit drugs. family history includes Cancer in her mother; Diabetes in her mother; Hypertension in her mother. There is no history of Colon cancer. Allergies  Allergen Reactions  . Hydrocodone-Acetaminophen     REACTION: GI upset  . Tramadol Itching   Current Outpatient Prescriptions on File Prior to Visit  Medication Sig  Dispense Refill  . aspirin 81 MG tablet Take 81 mg by mouth daily.      . benzonatate (TESSALON) 200 MG capsule Take 1 capsule (200 mg total) by mouth 3 (three) times daily as needed for cough. 9 capsule 0  . calcium-vitamin D (OSCAL WITH D) 500-200 MG-UNIT per tablet Take 1 tablet by mouth daily with breakfast.     . doxycycline (VIBRAMYCIN) 100 MG capsule Take 1 capsule (100 mg total) by mouth 2 (two) times daily. One po bid x 7 days 14 capsule 0  . estradiol (ESTRACE) 0.5 MG tablet Take 0.5 mg by mouth daily.    Marland Kitchen HYDROcodone-homatropine (HYCODAN) 5-1.5 MG/5ML syrup Take 5 mLs by mouth every 6 (six) hours as needed for cough. 180 mL 0  . latanoprost (XALATAN) 0.005 % ophthalmic solution Place 1 drop into both eyes at bedtime.    Marland Kitchen levofloxacin (LEVAQUIN) 250 MG tablet Take 1 tablet (250 mg total) by mouth daily. 10 tablet 0  . meclizine (ANTIVERT) 25 MG tablet Take 1 tablet (25 mg total) by mouth every 8 (eight) hours as needed for dizziness. 30 tablet 0  . meloxicam (MOBIC) 7.5 MG tablet Take 1 tablet (7.5 mg total) by mouth daily. 90 tablet 1  . methocarbamol (ROBAXIN) 500 MG tablet Take 1 tablet (500 mg total) by mouth every 6 (six) hours as needed. (Patient taking differently: Take 500 mg by mouth every 6 (six) hours as needed  for muscle spasms. ) 40 tablet 1  . omeprazole (PRILOSEC) 40 MG capsule TAKE ONE CAPSULE BY MOUTH 30 MINUTES BEFORE FIRST AND LAST MEAL OF THE DAY 180 capsule 0  . oseltamivir (TAMIFLU) 75 MG capsule Take 1 capsule (75 mg total) by mouth every 12 (twelve) hours. X 5 days 10 capsule 0  . oxyCODONE-acetaminophen (PERCOCET/ROXICET) 5-325 MG per tablet Take 1 tablet by mouth every 4 (four) hours as needed. (this has tylenol in it) (Patient taking differently: Take 1 tablet by mouth every 4 (four) hours as needed for moderate pain. (this has tylenol in it)) 60 tablet 0  . potassium chloride SA (K-DUR,KLOR-CON) 20 MEQ tablet TAKE 1 TABLET BY MOUTH EVERY DAY 30 tablet 0  .  predniSONE (DELTASONE) 10 MG tablet 3 tabs by mouth per day for 3 days,2tabs per day for 3 days,1tab per day for 3 days 18 tablet 0  . spironolactone (ALDACTONE) 25 MG tablet TAKE 1 TABLET BY MOUTH TWICE DAILY 180 tablet 1   No current facility-administered medications on file prior to visit.   Review of Systems  Constitutional: Negative for unusual diaphoresis or night sweats HENT: Negative for ringing in ear or discharge Eyes: Negative for double vision or worsening visual disturbance.  Respiratory: Negative for choking and stridor.   Gastrointestinal: Negative for vomiting or other signifcant bowel change Genitourinary: Negative for hematuria or change in urine volume.  Musculoskeletal: Negative for other MSK pain or swelling Skin: Negative for color change and worsening wound.  Neurological: Negative for tremors and numbness other than noted  Psychiatric/Behavioral: Negative for decreased concentration or agitation other than above       Objective:   Physical Exam BP 132/82 mmHg  Pulse 69  Temp(Src) 98 F (36.7 C) (Oral)  Resp 20  Wt 183 lb (83.008 kg)  SpO2 98% VS noted, not ill appearing Constitutional: Pt appears in no significant distress HENT: Head: NCAT.  Right Ear: External ear normal.  Left Ear: External ear normal.  Eyes: . Pupils are equal, round, and reactive to light. Conjunctivae and EOM are normal Neck: Normal range of motion. Neck supple.  Cardiovascular: Normal rate and regular rhythm.   Pulmonary/Chest: Effort normal and breath sounds mild reduced bilat without rales or wheezing.  Neurological: Pt is alert. Not confused , motor grossly intact Skin: Skin is warm. No rash, no LE edema Psychiatric: Pt behavior is normal. No agitation.     Assessment & Plan:

## 2015-07-26 NOTE — Assessment & Plan Note (Signed)
stable overall by history and exam, recent data reviewed with pt, and pt to continue medical treatment as before,  to f/u any worsening symptoms or concerns BP Readings from Last 3 Encounters:  07/26/15 132/82  07/19/15 110/70  07/14/15 118/64

## 2015-07-26 NOTE — Progress Notes (Signed)
Pre visit review using our clinic review tool, if applicable. No additional management support is needed unless otherwise documented below in the visit note. 

## 2015-07-26 NOTE — Assessment & Plan Note (Signed)
Resolved clinically, for breo use per pulmonary to start,  to f/u any worsening symptoms or concerns

## 2015-07-26 NOTE — Assessment & Plan Note (Signed)
Has some mild residual nonprod cough, overall much improved, for otc delsym prn,  to f/u any worsening symptoms or concerns

## 2015-07-29 ENCOUNTER — Ambulatory Visit (INDEPENDENT_AMBULATORY_CARE_PROVIDER_SITE_OTHER): Payer: BC Managed Care – PPO

## 2015-07-29 DIAGNOSIS — J309 Allergic rhinitis, unspecified: Secondary | ICD-10-CM | POA: Diagnosis not present

## 2015-07-31 ENCOUNTER — Encounter: Payer: Self-pay | Admitting: Internal Medicine

## 2015-08-02 ENCOUNTER — Other Ambulatory Visit: Payer: Self-pay | Admitting: Internal Medicine

## 2015-08-05 ENCOUNTER — Ambulatory Visit (INDEPENDENT_AMBULATORY_CARE_PROVIDER_SITE_OTHER): Payer: BC Managed Care – PPO

## 2015-08-05 DIAGNOSIS — J309 Allergic rhinitis, unspecified: Secondary | ICD-10-CM

## 2015-08-12 ENCOUNTER — Ambulatory Visit: Payer: BC Managed Care – PPO

## 2015-08-12 ENCOUNTER — Ambulatory Visit (INDEPENDENT_AMBULATORY_CARE_PROVIDER_SITE_OTHER): Payer: BC Managed Care – PPO | Admitting: *Deleted

## 2015-08-12 DIAGNOSIS — J309 Allergic rhinitis, unspecified: Secondary | ICD-10-CM

## 2015-08-19 ENCOUNTER — Ambulatory Visit (INDEPENDENT_AMBULATORY_CARE_PROVIDER_SITE_OTHER): Payer: BC Managed Care – PPO | Admitting: *Deleted

## 2015-08-19 ENCOUNTER — Ambulatory Visit: Payer: BC Managed Care – PPO

## 2015-08-19 DIAGNOSIS — J309 Allergic rhinitis, unspecified: Secondary | ICD-10-CM

## 2015-08-26 ENCOUNTER — Ambulatory Visit (INDEPENDENT_AMBULATORY_CARE_PROVIDER_SITE_OTHER): Payer: BC Managed Care – PPO | Admitting: *Deleted

## 2015-08-26 ENCOUNTER — Ambulatory Visit: Payer: BC Managed Care – PPO

## 2015-08-26 DIAGNOSIS — J309 Allergic rhinitis, unspecified: Secondary | ICD-10-CM

## 2015-08-30 ENCOUNTER — Telehealth: Payer: Self-pay | Admitting: Internal Medicine

## 2015-08-30 DIAGNOSIS — J309 Allergic rhinitis, unspecified: Secondary | ICD-10-CM | POA: Diagnosis not present

## 2015-08-30 NOTE — Telephone Encounter (Signed)
Allergy Serum Extract Date Mixed: 08/30/2015 Vial: AB Strength: 1:500 Here/Mail/Pick Up: Here Mixed By: Jaynee EaglesLindsay Lemons, CMA Last OV: 03/04/2015 Pending OV: 02/27/2016

## 2015-09-02 ENCOUNTER — Ambulatory Visit: Payer: BC Managed Care – PPO

## 2015-09-02 ENCOUNTER — Ambulatory Visit (INDEPENDENT_AMBULATORY_CARE_PROVIDER_SITE_OTHER): Payer: BC Managed Care – PPO | Admitting: *Deleted

## 2015-09-02 DIAGNOSIS — J309 Allergic rhinitis, unspecified: Secondary | ICD-10-CM | POA: Diagnosis not present

## 2015-09-09 ENCOUNTER — Ambulatory Visit (INDEPENDENT_AMBULATORY_CARE_PROVIDER_SITE_OTHER): Payer: BC Managed Care – PPO

## 2015-09-09 DIAGNOSIS — J309 Allergic rhinitis, unspecified: Secondary | ICD-10-CM | POA: Diagnosis not present

## 2015-09-11 ENCOUNTER — Other Ambulatory Visit: Payer: Self-pay | Admitting: Internal Medicine

## 2015-09-16 ENCOUNTER — Encounter: Payer: Self-pay | Admitting: Internal Medicine

## 2015-09-16 ENCOUNTER — Ambulatory Visit (INDEPENDENT_AMBULATORY_CARE_PROVIDER_SITE_OTHER): Payer: BC Managed Care – PPO | Admitting: Internal Medicine

## 2015-09-16 ENCOUNTER — Ambulatory Visit (INDEPENDENT_AMBULATORY_CARE_PROVIDER_SITE_OTHER): Payer: BC Managed Care – PPO | Admitting: *Deleted

## 2015-09-16 VITALS — BP 132/62 | HR 67 | Temp 98.2°F | Resp 18 | Ht 62.0 in | Wt 188.8 lb

## 2015-09-16 DIAGNOSIS — J309 Allergic rhinitis, unspecified: Secondary | ICD-10-CM

## 2015-09-16 DIAGNOSIS — R059 Cough, unspecified: Secondary | ICD-10-CM

## 2015-09-16 DIAGNOSIS — R05 Cough: Secondary | ICD-10-CM

## 2015-09-16 NOTE — Progress Notes (Signed)
Pre visit review using our clinic review tool, if applicable. No additional management support is needed unless otherwise documented below in the visit note. 

## 2015-09-16 NOTE — Patient Instructions (Signed)
We will watch that area and see if the pain stays away.   It is possible that it was a pulled muscle from the coughing earlier this year.   If the pain comes back feel free to call the office.   We have given you a handicapped sticker as well [facility].

## 2015-09-17 ENCOUNTER — Other Ambulatory Visit: Payer: Self-pay | Admitting: Internal Medicine

## 2015-09-17 NOTE — Assessment & Plan Note (Signed)
The cough is now resolved but I suspect this may have strained a muscle. This has resolved without intervention. If it recurs she will call back. No indication for further workup today.

## 2015-09-17 NOTE — Progress Notes (Signed)
   Subjective:    Patient ID: Catherine Hoffman, female    DOB: 07/25/1940, 75 y.o.   MRN: [id number]  HPI The patient is a 75 YO female coming in for some tightness under her ribs. She was sick in February and spent about 6-8 weeks coughing and had to take 2 courses of antibiotics. She denies excessive exercise or different activities. No fevers or chills. Cough finally gone. Taking her omeprazole but not regularly. She will take it when she has symptoms. Denies that this feels similar. Occurs randomly and not provoked by activity. Sometimes provoked by twisting. Was 4/10 but not has not been present for 3 days. Started about 2 weeks ago. Did not take anything for it.   Review of Systems  Constitutional: Negative for fever, activity change, appetite change, fatigue and unexpected weight change.  HENT: Negative for congestion, postnasal drip, rhinorrhea, sinus pressure, sore throat and trouble swallowing.   Eyes: Negative.   Respiratory: Negative for chest tightness, shortness of breath and wheezing.   Cardiovascular: Negative for chest pain, palpitations and leg swelling.       Pain under ribs, not present today  Gastrointestinal: Negative for nausea, abdominal pain, diarrhea, constipation and abdominal distention.  Musculoskeletal: Positive for myalgias and arthralgias. Negative for back pain and gait problem.  Skin: Negative.   Neurological: Negative for dizziness, syncope, weakness, light-headedness and numbness.  Psychiatric/Behavioral: Negative.       Objective:   Physical Exam  Constitutional: She is oriented to person, place, and time. She appears well-developed and well-nourished.  Overweight  HENT:  Head: Normocephalic and atraumatic.  Eyes: EOM are normal.  Neck: Normal range of motion. No JVD present. No thyromegaly present.  Cardiovascular: Normal rate and regular rhythm.   Pulmonary/Chest: Effort normal and breath sounds normal. No respiratory distress. She has no wheezes.  She has no rales. She exhibits no tenderness.  Abdominal: Soft. Bowel sounds are normal. She exhibits no distension. There is no tenderness.  Musculoskeletal: She exhibits no edema or tenderness.  No tenderness with palpation over the lower ribs or stomach  Lymphadenopathy:    She has no cervical adenopathy.  Neurological: She is alert and oriented to person, place, and time. Coordination normal.  Skin: Skin is warm and dry.   Filed Vitals:   09/16/15 1610  BP: 132/62  Pulse: 67  Temp: 98.2 F (36.8 C)  TempSrc: Oral  Resp: 18  Height: 5\' 2"  (1.575 m)  Weight: 188 lb 12.8 oz (85.639 kg)  SpO2: 96%      Assessment & Plan:

## 2015-09-18 DIAGNOSIS — H25812 Combined forms of age-related cataract, left eye: Secondary | ICD-10-CM | POA: Diagnosis not present

## 2015-09-18 DIAGNOSIS — H401213 Low-tension glaucoma, right eye, severe stage: Secondary | ICD-10-CM | POA: Diagnosis not present

## 2015-09-18 DIAGNOSIS — H401221 Low-tension glaucoma, left eye, mild stage: Secondary | ICD-10-CM | POA: Diagnosis not present

## 2015-09-18 DIAGNOSIS — Z961 Presence of intraocular lens: Secondary | ICD-10-CM | POA: Diagnosis not present

## 2015-09-23 ENCOUNTER — Ambulatory Visit (INDEPENDENT_AMBULATORY_CARE_PROVIDER_SITE_OTHER): Payer: BC Managed Care – PPO | Admitting: *Deleted

## 2015-09-23 DIAGNOSIS — J309 Allergic rhinitis, unspecified: Secondary | ICD-10-CM | POA: Diagnosis not present

## 2015-09-30 ENCOUNTER — Ambulatory Visit: Payer: BC Managed Care – PPO

## 2015-10-01 ENCOUNTER — Ambulatory Visit (INDEPENDENT_AMBULATORY_CARE_PROVIDER_SITE_OTHER): Payer: BC Managed Care – PPO | Admitting: *Deleted

## 2015-10-01 DIAGNOSIS — J309 Allergic rhinitis, unspecified: Secondary | ICD-10-CM | POA: Diagnosis not present

## 2015-10-07 ENCOUNTER — Other Ambulatory Visit: Payer: Self-pay | Admitting: Internal Medicine

## 2015-10-07 ENCOUNTER — Ambulatory Visit (INDEPENDENT_AMBULATORY_CARE_PROVIDER_SITE_OTHER): Payer: BC Managed Care – PPO | Admitting: *Deleted

## 2015-10-07 DIAGNOSIS — J309 Allergic rhinitis, unspecified: Secondary | ICD-10-CM

## 2015-10-08 ENCOUNTER — Other Ambulatory Visit: Payer: Self-pay | Admitting: *Deleted

## 2015-10-08 MED ORDER — SPIRONOLACTONE 25 MG PO TABS: 25.0000 mg | ORAL_TABLET | Freq: Two times a day (BID) | ORAL | Status: DC

## 2015-10-08 NOTE — Telephone Encounter (Signed)
Received call pt states she need updated rx on her spironolactone no longer seeing Dr. Sherene SiresWert. Inform pt will send to pharmacy...Raechel Chute/lmb

## 2015-10-14 ENCOUNTER — Ambulatory Visit: Payer: BC Managed Care – PPO

## 2015-10-15 ENCOUNTER — Ambulatory Visit (INDEPENDENT_AMBULATORY_CARE_PROVIDER_SITE_OTHER): Payer: BC Managed Care – PPO | Admitting: *Deleted

## 2015-10-15 DIAGNOSIS — J309 Allergic rhinitis, unspecified: Secondary | ICD-10-CM | POA: Diagnosis not present

## 2015-10-21 ENCOUNTER — Ambulatory Visit (INDEPENDENT_AMBULATORY_CARE_PROVIDER_SITE_OTHER): Payer: BC Managed Care – PPO | Admitting: *Deleted

## 2015-10-21 DIAGNOSIS — J309 Allergic rhinitis, unspecified: Secondary | ICD-10-CM

## 2015-10-24 ENCOUNTER — Encounter: Payer: Self-pay | Admitting: Internal Medicine

## 2015-10-28 ENCOUNTER — Ambulatory Visit: Payer: BC Managed Care – PPO

## 2015-10-30 ENCOUNTER — Ambulatory Visit (INDEPENDENT_AMBULATORY_CARE_PROVIDER_SITE_OTHER): Payer: BC Managed Care – PPO

## 2015-10-30 DIAGNOSIS — J309 Allergic rhinitis, unspecified: Secondary | ICD-10-CM | POA: Diagnosis not present

## 2015-11-05 ENCOUNTER — Ambulatory Visit (INDEPENDENT_AMBULATORY_CARE_PROVIDER_SITE_OTHER): Payer: BC Managed Care – PPO | Admitting: *Deleted

## 2015-11-05 DIAGNOSIS — J309 Allergic rhinitis, unspecified: Secondary | ICD-10-CM | POA: Diagnosis not present

## 2015-11-11 ENCOUNTER — Ambulatory Visit (INDEPENDENT_AMBULATORY_CARE_PROVIDER_SITE_OTHER): Payer: BC Managed Care – PPO | Admitting: *Deleted

## 2015-11-11 DIAGNOSIS — J309 Allergic rhinitis, unspecified: Secondary | ICD-10-CM | POA: Diagnosis not present

## 2015-11-18 ENCOUNTER — Ambulatory Visit (INDEPENDENT_AMBULATORY_CARE_PROVIDER_SITE_OTHER): Payer: BC Managed Care – PPO | Admitting: *Deleted

## 2015-11-18 ENCOUNTER — Ambulatory Visit: Payer: BC Managed Care – PPO

## 2015-11-18 DIAGNOSIS — J309 Allergic rhinitis, unspecified: Secondary | ICD-10-CM

## 2015-11-22 ENCOUNTER — Encounter: Payer: BC Managed Care – PPO | Admitting: Internal Medicine

## 2015-11-25 ENCOUNTER — Ambulatory Visit (INDEPENDENT_AMBULATORY_CARE_PROVIDER_SITE_OTHER): Payer: BC Managed Care – PPO

## 2015-11-25 DIAGNOSIS — J309 Allergic rhinitis, unspecified: Secondary | ICD-10-CM | POA: Diagnosis not present

## 2015-12-02 ENCOUNTER — Ambulatory Visit (INDEPENDENT_AMBULATORY_CARE_PROVIDER_SITE_OTHER): Payer: BC Managed Care – PPO | Admitting: *Deleted

## 2015-12-02 DIAGNOSIS — J309 Allergic rhinitis, unspecified: Secondary | ICD-10-CM

## 2015-12-09 ENCOUNTER — Ambulatory Visit (INDEPENDENT_AMBULATORY_CARE_PROVIDER_SITE_OTHER): Payer: BC Managed Care – PPO | Admitting: *Deleted

## 2015-12-09 DIAGNOSIS — J309 Allergic rhinitis, unspecified: Secondary | ICD-10-CM | POA: Diagnosis not present

## 2015-12-16 ENCOUNTER — Ambulatory Visit: Payer: BC Managed Care – PPO

## 2015-12-17 ENCOUNTER — Ambulatory Visit (INDEPENDENT_AMBULATORY_CARE_PROVIDER_SITE_OTHER): Payer: BC Managed Care – PPO | Admitting: *Deleted

## 2015-12-17 DIAGNOSIS — J309 Allergic rhinitis, unspecified: Secondary | ICD-10-CM

## 2015-12-18 ENCOUNTER — Other Ambulatory Visit: Payer: Self-pay | Admitting: Internal Medicine

## 2015-12-19 ENCOUNTER — Telehealth: Payer: Self-pay | Admitting: Internal Medicine

## 2015-12-19 DIAGNOSIS — J309 Allergic rhinitis, unspecified: Secondary | ICD-10-CM | POA: Diagnosis not present

## 2015-12-19 NOTE — Telephone Encounter (Signed)
Allergy Serum Extract Date Mixed: 12/19/15 Vial: 2 Strength: 1:500 Here/Mail/Pick Up: here Mixed By: tbs Last OV: 02/27/15 Pending OV: 02/27/16

## 2015-12-23 ENCOUNTER — Ambulatory Visit: Payer: BC Managed Care – PPO

## 2015-12-25 ENCOUNTER — Ambulatory Visit (INDEPENDENT_AMBULATORY_CARE_PROVIDER_SITE_OTHER): Payer: BC Managed Care – PPO | Admitting: *Deleted

## 2015-12-25 DIAGNOSIS — J309 Allergic rhinitis, unspecified: Secondary | ICD-10-CM | POA: Diagnosis not present

## 2015-12-30 ENCOUNTER — Ambulatory Visit (INDEPENDENT_AMBULATORY_CARE_PROVIDER_SITE_OTHER): Payer: BC Managed Care – PPO | Admitting: *Deleted

## 2015-12-30 DIAGNOSIS — J309 Allergic rhinitis, unspecified: Secondary | ICD-10-CM | POA: Diagnosis not present

## 2016-01-06 ENCOUNTER — Ambulatory Visit (INDEPENDENT_AMBULATORY_CARE_PROVIDER_SITE_OTHER): Payer: BC Managed Care – PPO | Admitting: *Deleted

## 2016-01-06 DIAGNOSIS — J309 Allergic rhinitis, unspecified: Secondary | ICD-10-CM | POA: Diagnosis not present

## 2016-01-13 ENCOUNTER — Ambulatory Visit (INDEPENDENT_AMBULATORY_CARE_PROVIDER_SITE_OTHER): Payer: BC Managed Care – PPO | Admitting: *Deleted

## 2016-01-13 DIAGNOSIS — J309 Allergic rhinitis, unspecified: Secondary | ICD-10-CM

## 2016-01-20 ENCOUNTER — Ambulatory Visit (INDEPENDENT_AMBULATORY_CARE_PROVIDER_SITE_OTHER): Payer: BC Managed Care – PPO | Admitting: *Deleted

## 2016-01-20 ENCOUNTER — Ambulatory Visit: Payer: BC Managed Care – PPO

## 2016-01-20 DIAGNOSIS — J309 Allergic rhinitis, unspecified: Secondary | ICD-10-CM | POA: Diagnosis not present

## 2016-01-27 ENCOUNTER — Ambulatory Visit: Payer: BC Managed Care – PPO

## 2016-01-27 ENCOUNTER — Ambulatory Visit (INDEPENDENT_AMBULATORY_CARE_PROVIDER_SITE_OTHER): Payer: BC Managed Care – PPO | Admitting: *Deleted

## 2016-01-27 DIAGNOSIS — J309 Allergic rhinitis, unspecified: Secondary | ICD-10-CM | POA: Diagnosis not present

## 2016-01-30 ENCOUNTER — Ambulatory Visit (INDEPENDENT_AMBULATORY_CARE_PROVIDER_SITE_OTHER): Payer: BC Managed Care – PPO

## 2016-01-30 DIAGNOSIS — Z23 Encounter for immunization: Secondary | ICD-10-CM | POA: Diagnosis not present

## 2016-02-03 ENCOUNTER — Ambulatory Visit: Payer: BC Managed Care – PPO

## 2016-02-03 ENCOUNTER — Ambulatory Visit (INDEPENDENT_AMBULATORY_CARE_PROVIDER_SITE_OTHER): Payer: BC Managed Care – PPO | Admitting: *Deleted

## 2016-02-03 DIAGNOSIS — J309 Allergic rhinitis, unspecified: Secondary | ICD-10-CM

## 2016-02-11 ENCOUNTER — Ambulatory Visit (INDEPENDENT_AMBULATORY_CARE_PROVIDER_SITE_OTHER): Payer: BC Managed Care – PPO | Admitting: *Deleted

## 2016-02-11 DIAGNOSIS — J309 Allergic rhinitis, unspecified: Secondary | ICD-10-CM

## 2016-02-12 ENCOUNTER — Other Ambulatory Visit: Payer: Self-pay | Admitting: Internal Medicine

## 2016-02-17 ENCOUNTER — Ambulatory Visit (INDEPENDENT_AMBULATORY_CARE_PROVIDER_SITE_OTHER): Payer: BC Managed Care – PPO | Admitting: *Deleted

## 2016-02-17 DIAGNOSIS — J309 Allergic rhinitis, unspecified: Secondary | ICD-10-CM | POA: Diagnosis not present

## 2016-02-24 ENCOUNTER — Ambulatory Visit (INDEPENDENT_AMBULATORY_CARE_PROVIDER_SITE_OTHER): Payer: BC Managed Care – PPO | Admitting: *Deleted

## 2016-02-24 DIAGNOSIS — J309 Allergic rhinitis, unspecified: Secondary | ICD-10-CM

## 2016-02-27 ENCOUNTER — Ambulatory Visit (INDEPENDENT_AMBULATORY_CARE_PROVIDER_SITE_OTHER): Payer: BC Managed Care – PPO | Admitting: Internal Medicine

## 2016-02-27 ENCOUNTER — Other Ambulatory Visit (INDEPENDENT_AMBULATORY_CARE_PROVIDER_SITE_OTHER): Payer: BC Managed Care – PPO

## 2016-02-27 ENCOUNTER — Encounter: Payer: Self-pay | Admitting: Internal Medicine

## 2016-02-27 ENCOUNTER — Ambulatory Visit: Payer: Medicare Other | Admitting: Internal Medicine

## 2016-02-27 VITALS — BP 132/60 | HR 69 | Temp 98.1°F | Resp 12 | Ht 62.0 in | Wt 184.0 lb

## 2016-02-27 DIAGNOSIS — Z0000 Encounter for general adult medical examination without abnormal findings: Secondary | ICD-10-CM | POA: Diagnosis not present

## 2016-02-27 DIAGNOSIS — E785 Hyperlipidemia, unspecified: Secondary | ICD-10-CM

## 2016-02-27 DIAGNOSIS — I10 Essential (primary) hypertension: Secondary | ICD-10-CM

## 2016-02-27 LAB — COMPREHENSIVE METABOLIC PANEL
ALBUMIN: 4.1 g/dL (ref 3.5–5.2)
ALK PHOS: 73 U/L (ref 39–117)
ALT: 11 U/L (ref 0–35)
AST: 15 U/L (ref 0–37)
BILIRUBIN TOTAL: 0.7 mg/dL (ref 0.2–1.2)
BUN: 14 mg/dL (ref 6–23)
CALCIUM: 9.5 mg/dL (ref 8.4–10.5)
CHLORIDE: 105 meq/L (ref 96–112)
CO2: 28 mEq/L (ref 19–32)
CREATININE: 1.06 mg/dL (ref 0.40–1.20)
GFR: 64.98 mL/min (ref >60.00)
Glucose, Bld: 79 mg/dL (ref 70–99)
Potassium: 4.1 mEq/L (ref 3.5–5.1)
Sodium: 140 mEq/L (ref 135–145)
TOTAL PROTEIN: 7.5 g/dL (ref 6.0–8.3)

## 2016-02-27 LAB — CBC
HCT: 39.7 % (ref 36.0–46.0)
Hemoglobin: 13.2 g/dL (ref 12.0–15.0)
MCHC: 33.2 g/dL (ref 30.0–36.0)
MCV: 89.1 fl (ref 78.0–100.0)
Platelets: 207 10*3/uL (ref 150.0–400.0)
RBC: 4.46 Mil/uL (ref 3.87–5.11)
RDW: 13.5 % (ref 11.5–15.5)
WBC: 5.5 10*3/uL (ref 4.0–10.5)

## 2016-02-27 LAB — LIPID PANEL
CHOLESTEROL: 227 mg/dL — AB (ref 0–200)
HDL: 46.2 mg/dL (ref >39.00)
LDL CALC: 165 mg/dL — AB (ref 0–99)
NonHDL: 181.21
TRIGLYCERIDES: 81 mg/dL (ref 0.0–149.0)
Total CHOL/HDL Ratio: 5
VLDL: 16.2 mg/dL (ref 0.0–40.0)

## 2016-02-27 LAB — HEMOGLOBIN A1C: Hgb A1c MFr Bld: 5.7 % (ref 4.6–6.5)

## 2016-02-27 MED ORDER — FLUTICASONE PROPIONATE 50 MCG/ACT NA SUSP: 2.0000 | Freq: Every day | NASAL | 3 refills | Status: DC

## 2016-02-27 MED ORDER — MELOXICAM 7.5 MG PO TABS: 7.5000 mg | ORAL_TABLET | Freq: Every day | ORAL | 3 refills | Status: DC

## 2016-02-27 NOTE — Assessment & Plan Note (Signed)
BP at goal on spironolactone. Checking CMP and adjust as needed.  

## 2016-02-27 NOTE — Assessment & Plan Note (Signed)
Checking labs, discussed the bone density with her and she will wait and think about it. Thinks she had one years ago which was normal. Declines shingles shot today. Flu and pneumonia and tetanus up to date. Counseled on sun safety and mole surveillance as well as the dangers of distracted driving. Given 10 year screening recommendations.

## 2016-02-27 NOTE — Progress Notes (Signed)
   Subjective:    Patient ID: Catherine Hoffman, female    DOB: Mar 02, 1941, 75 y.o.   MRN: [id number]  HPI The patient is a 75 YO female coming in for wellness.   [facility], [facility] reviewed and updated.   Review of Systems  Constitutional: Negative for activity change, appetite change, fatigue, fever and unexpected weight change.  HENT: Negative for congestion, postnasal drip, rhinorrhea, sinus pressure, sore throat and trouble swallowing.   Eyes: Negative.   Respiratory: Negative for chest tightness, shortness of breath and wheezing.   Cardiovascular: Negative for chest pain, palpitations and leg swelling.  Gastrointestinal: Negative for abdominal distention, abdominal pain, constipation, diarrhea and nausea.  Musculoskeletal: Positive for arthralgias. Negative for back pain, gait problem and myalgias.  Skin: Negative.   Neurological: Negative for dizziness, syncope, weakness, light-headedness and numbness.  Psychiatric/Behavioral: Negative.       Objective:   Physical Exam  Constitutional: She is oriented to person, place, and time. She appears well-developed and well-nourished.  Overweight  HENT:  Head: Normocephalic and atraumatic.  Eyes: EOM are normal.  Neck: Normal range of motion. No JVD present. No thyromegaly present.  Cardiovascular: Normal rate and regular rhythm.   Pulmonary/Chest: Effort normal and breath sounds normal. No respiratory distress. She has no wheezes. She has no rales. She exhibits no tenderness.  Abdominal: Soft. Bowel sounds are normal. She exhibits no distension. There is no tenderness.  Musculoskeletal: She exhibits no edema or tenderness.  Lymphadenopathy:    She has no cervical adenopathy.  Neurological: She is alert and oriented to person, place, and time. Coordination normal.  Skin: Skin is warm and dry.  Psychiatric: She has a normal mood and affect.   Vitals:   02/27/16 1031  BP: 132/60  Pulse: 69  Resp: 12  Temp: 98.1 F (36.7 C)   TempSrc: Oral  SpO2: 96%  Weight: 184 lb (83.5 kg)  Height: 5\' 2"  (1.575 m)      Assessment & Plan:

## 2016-02-27 NOTE — Assessment & Plan Note (Signed)
Checking lipid panel, last at goal off meds.

## 2016-02-27 NOTE — Progress Notes (Signed)
Pre visit review using our clinic review tool, if applicable. No additional management support is needed unless otherwise documented below in the visit note. 

## 2016-02-27 NOTE — Patient Instructions (Signed)
We are checking the labs today and will call you back with the results.   Work on trying to get back into exercising to help with the health.  Health Maintenance, Female Adopting a healthy lifestyle and getting preventive care can go a long way to promote health and wellness. Talk with your health care provider about what schedule of regular examinations is right for you. This is a good chance for you to check in with your provider about disease prevention and staying healthy. In between checkups, there are plenty of things you can do on your own. Experts have done a lot of research about which lifestyle changes and preventive measures are most likely to keep you healthy. Ask your health care provider for more information. WEIGHT AND DIET  Eat a healthy diet  Be sure to include plenty of vegetables, fruits, low-fat dairy products, and lean protein.  Do not eat a lot of foods high in solid fats, added sugars, or salt.  Get regular exercise. This is one of the most important things you can do for your health.  Most adults should exercise for at least 150 minutes each week. The exercise should increase your heart rate and make you sweat (moderate-intensity exercise).  Most adults should also do strengthening exercises at least twice a week. This is in addition to the moderate-intensity exercise.  Maintain a healthy weight  Body mass index (BMI) is a measurement that can be used to identify possible weight problems. It estimates body fat based on height and weight. Your health care provider can help determine your BMI and help you achieve or maintain a healthy weight.  For females 25 years of age and older:   A BMI below 18.5 is considered underweight.  A BMI of 18.5 to 24.9 is normal.  A BMI of 25 to 29.9 is considered overweight.  A BMI of 30 and above is considered obese.  Watch levels of cholesterol and blood lipids  You should start having your blood tested for lipids and  cholesterol at 75 years of age, then have this test every 5 years.  You may need to have your cholesterol levels checked more often if:  Your lipid or cholesterol levels are high.  You are older than 75 years of age.  You are at high risk for heart disease.  CANCER SCREENING   Lung Cancer  Lung cancer screening is recommended for adults 24-58 years old who are at high risk for lung cancer because of a history of smoking.  A yearly low-dose CT scan of the lungs is recommended for people who:  Currently smoke.  Have quit within the past 15 years.  Have at least a 30-pack-year history of smoking. A pack year is smoking an average of one pack of cigarettes a day for 1 year.  Yearly screening should continue until it has been 15 years since you quit.  Yearly screening should stop if you develop a health problem that would prevent you from having lung cancer treatment.  Breast Cancer  Practice breast self-awareness. This means understanding how your breasts normally appear and feel.  It also means doing regular breast self-exams. Let your health care provider know about any changes, no matter how small.  If you are in your 20s or 30s, you should have a clinical breast exam (CBE) by a health care provider every 1-3 years as part of a regular health exam.  If you are 50 or older, have a CBE every year. Also  consider having a breast X-ray (mammogram) every year.  If you have a family history of breast cancer, talk to your health care provider about genetic screening.  If you are at high risk for breast cancer, talk to your health care provider about having an MRI and a mammogram every year.  Breast cancer gene (BRCA) assessment is recommended for women who have family members with BRCA-related cancers. BRCA-related cancers include:  Breast.  Ovarian.  Tubal.  Peritoneal cancers.  Results of the assessment will determine the need for genetic counseling and BRCA1 and BRCA2  testing. Cervical Cancer Your health care provider may recommend that you be screened regularly for cancer of the pelvic organs (ovaries, uterus, and vagina). This screening involves a pelvic examination, including checking for microscopic changes to the surface of your cervix (Pap test). You may be encouraged to have this screening done every 3 years, beginning at age 31.  For women ages 67-65, health care providers may recommend pelvic exams and Pap testing every 3 years, or they may recommend the Pap and pelvic exam, combined with testing for human papilloma virus (HPV), every 5 years. Some types of HPV increase your risk of cervical cancer. Testing for HPV may also be done on women of any age with unclear Pap test results.  Other health care providers may not recommend any screening for nonpregnant women who are considered low risk for pelvic cancer and who do not have symptoms. Ask your health care provider if a screening pelvic exam is right for you.  If you have had past treatment for cervical cancer or a condition that could lead to cancer, you need Pap tests and screening for cancer for at least 20 years after your treatment. If Pap tests have been discontinued, your risk factors (such as having a new sexual partner) need to be reassessed to determine if screening should resume. Some women have medical problems that increase the chance of getting cervical cancer. In these cases, your health care provider may recommend more frequent screening and Pap tests. Colorectal Cancer  This type of cancer can be detected and often prevented.  Routine colorectal cancer screening usually begins at 75 years of age and continues through 75 years of age.  Your health care provider may recommend screening at an earlier age if you have risk factors for colon cancer.  Your health care provider may also recommend using home test kits to check for hidden blood in the stool.  A small camera at the end of a  tube can be used to examine your colon directly (sigmoidoscopy or colonoscopy). This is done to check for the earliest forms of colorectal cancer.  Routine screening usually begins at age 25.  Direct examination of the colon should be repeated every 5-10 years through 75 years of age. However, you may need to be screened more often if early forms of precancerous polyps or small growths are found. Skin Cancer  Check your skin from head to toe regularly.  Tell your health care provider about any new moles or changes in moles, especially if there is a change in a mole's shape or color.  Also tell your health care provider if you have a mole that is larger than the size of a pencil eraser.  Always use sunscreen. Apply sunscreen liberally and repeatedly throughout the day.  Protect yourself by wearing long sleeves, pants, a wide-brimmed hat, and sunglasses whenever you are outside. HEART DISEASE, DIABETES, AND HIGH BLOOD PRESSURE   High  blood pressure causes heart disease and increases the risk of stroke. High blood pressure is more likely to develop in:  People who have blood pressure in the high end of the normal range (130-139/85-89 mm Hg).  People who are overweight or obese.  People who are African American.  If you are 30-90 years of age, have your blood pressure checked every 3-5 years. If you are 83 years of age or older, have your blood pressure checked every year. You should have your blood pressure measured twice--once when you are at a hospital or clinic, and once when you are not at a hospital or clinic. Record the average of the two measurements. To check your blood pressure when you are not at a hospital or clinic, you can use:  An automated blood pressure machine at a pharmacy.  A home blood pressure monitor.  If you are between 66 years and 43 years old, ask your health care provider if you should take aspirin to prevent strokes.  Have regular diabetes screenings. This  involves taking a blood sample to check your fasting blood sugar level.  If you are at a normal weight and have a low risk for diabetes, have this test once every three years after 75 years of age.  If you are overweight and have a high risk for diabetes, consider being tested at a younger age or more often. PREVENTING INFECTION  Hepatitis B  If you have a higher risk for hepatitis B, you should be screened for this virus. You are considered at high risk for hepatitis B if:  You were born in a country where hepatitis B is common. Ask your health care provider which countries are considered high risk.  Your parents were born in a high-risk country, and you have not been immunized against hepatitis B (hepatitis B vaccine).  You have HIV or AIDS.  You use needles to inject street drugs.  You live with someone who has hepatitis B.  You have had sex with someone who has hepatitis B.  You get hemodialysis treatment.  You take certain medicines for conditions, including cancer, organ transplantation, and autoimmune conditions. Hepatitis C  Blood testing is recommended for:  Everyone born from [birth year] through 1965.  Anyone with known risk factors for hepatitis C. Sexually transmitted infections (STIs)  You should be screened for sexually transmitted infections (STIs) including gonorrhea and chlamydia if:  You are sexually active and are younger than 75 years of age.  You are older than 75 years of age and your health care provider tells you that you are at risk for this type of infection.  Your sexual activity has changed since you were last screened and you are at an increased risk for chlamydia or gonorrhea. Ask your health care provider if you are at risk.  If you do not have HIV, but are at risk, it may be recommended that you take a prescription medicine daily to prevent HIV infection. This is called pre-exposure prophylaxis (PrEP). You are considered at risk if:  You are  sexually active and do not regularly use condoms or know the HIV status of your partner(s).  You take drugs by injection.  You are sexually active with a partner who has HIV. Talk with your health care provider about whether you are at high risk of being infected with HIV. If you choose to begin PrEP, you should first be tested for HIV. You should then be tested every 3 months for as long  as you are taking PrEP.  PREGNANCY   If you are premenopausal and you may become pregnant, ask your health care provider about preconception counseling.  If you may become pregnant, take 400 to 800 micrograms (mcg) of folic acid every day.  If you want to prevent pregnancy, talk to your health care provider about birth control (contraception). OSTEOPOROSIS AND MENOPAUSE   Osteoporosis is a disease in which the bones lose minerals and strength with aging. This can result in serious bone fractures. Your risk for osteoporosis can be identified using a bone density scan.  If you are 65 years of age or older, or if you are at risk for osteoporosis and fractures, ask your health care provider if you should be screened.  Ask your health care provider whether you should take a calcium or vitamin D supplement to lower your risk for osteoporosis.  Menopause may have certain physical symptoms and risks.  Hormone replacement therapy may reduce some of these symptoms and risks. Talk to your health care provider about whether hormone replacement therapy is right for you.  HOME CARE INSTRUCTIONS   Schedule regular health, dental, and eye exams.  Stay current with your immunizations.   Do not use any tobacco products including cigarettes, chewing tobacco, or electronic cigarettes.  If you are pregnant, do not drink alcohol.  If you are breastfeeding, limit how much and how often you drink alcohol.  Limit alcohol intake to no more than 1 drink per day for nonpregnant women. One drink equals 12 ounces of beer, 5  ounces of wine, or 1 ounces of hard liquor.  Do not use street drugs.  Do not share needles.  Ask your health care provider for help if you need support or information about quitting drugs.  Tell your health care provider if you often feel depressed.  Tell your health care provider if you have ever been abused or do not feel safe at home.   This information is not intended to replace advice given to you by your health care provider. Make sure you discuss any questions you have with your health care provider.   Document Released: 12/08/2010 Document Revised: 06/15/2014 Document Reviewed: 04/26/2013 Elsevier Interactive Patient Education 2016 [employer]  

## 2016-03-02 ENCOUNTER — Ambulatory Visit (INDEPENDENT_AMBULATORY_CARE_PROVIDER_SITE_OTHER): Payer: BC Managed Care – PPO | Admitting: *Deleted

## 2016-03-02 DIAGNOSIS — J309 Allergic rhinitis, unspecified: Secondary | ICD-10-CM

## 2016-03-07 ENCOUNTER — Emergency Department (HOSPITAL_COMMUNITY)
Admission: EM | Admit: 2016-03-07 | Discharge: 2016-03-07 | Disposition: A | Discharge status: Home / Self Care | Payer: BC Managed Care – PPO | Attending: Emergency Medicine | Admitting: Emergency Medicine

## 2016-03-07 ENCOUNTER — Encounter (HOSPITAL_COMMUNITY): Payer: Self-pay

## 2016-03-07 DIAGNOSIS — Z7982 Long term (current) use of aspirin: Secondary | ICD-10-CM | POA: Insufficient documentation

## 2016-03-07 DIAGNOSIS — Z79899 Other long term (current) drug therapy: Secondary | ICD-10-CM | POA: Insufficient documentation

## 2016-03-07 DIAGNOSIS — I10 Essential (primary) hypertension: Secondary | ICD-10-CM | POA: Diagnosis not present

## 2016-03-07 DIAGNOSIS — K047 Periapical abscess without sinus: Secondary | ICD-10-CM | POA: Insufficient documentation

## 2016-03-07 DIAGNOSIS — K0889 Other specified disorders of teeth and supporting structures: Secondary | ICD-10-CM | POA: Diagnosis present

## 2016-03-07 MED ORDER — PENICILLIN V POTASSIUM 250 MG PO TABS
500.0000 mg | ORAL_TABLET | Freq: Once | ORAL | Status: AC
  Administered 2016-03-07: 500 mg via ORAL
  Filled 2016-03-07: qty 2

## 2016-03-07 MED ORDER — PENICILLIN V POTASSIUM 500 MG PO TABS: 500.0000 mg | ORAL_TABLET | Freq: Three times a day (TID) | ORAL | 0 refills | Status: AC

## 2016-03-07 MED ORDER — CHLORHEXIDINE GLUCONATE 0.12 % MT SOLN: 15.0000 mL | Freq: Two times a day (BID) | OROMUCOSAL | 0 refills | Status: DC

## 2016-03-07 NOTE — Discharge Instructions (Signed)
As discussed as discussed, and is improving help with your dentist in 2 days, use all medication as prescribed, and return here for concerning changes in her condition.

## 2016-03-07 NOTE — ED Provider Notes (Addendum)
[facility]-EMERGENCY DEPT Provider Note   CSN: [id number] Arrival date & time: 03/07/16  1550     History   Chief Complaint Chief Complaint  Patient presents with  . Dental Pain    HPI Catherine Hoffman is a 75 y.o. female.  HPI Elderly female presents with right facial pain. Onset was at least a few days ago, since onset the pain has been persistent, sore, nonradiating focally about the right side of the face. No difficulty breathing, speaking, swallowing. Minimal relief with OTC medication. No nausea, vomiting, fever, chills.  Past Medical History:  Diagnosis Date  . Arthritis   . Episodic tension type headache   . Esophageal reflux   . GERD (gastroesophageal reflux disease)   . Hyperlipidemia   . Hypertension   . Internal hemorrhoids   . Kidney stone on left side Hx-date unknown  . Left rotator cuff tear     Patient Active Problem List   Diagnosis Date Noted  . Routine general medical examination at a health care facility 02/27/2016  . Wheezing 07/19/2015  . Polypharmacy 11/24/2014  . Paresthesias 08/18/2014  . Cough 12/13/2012  . Shoulder pain, left 11/17/2011  . Essential hypertension 09/28/2010  . RENAL CALCULUS 08/08/2010  . Hyperlipidemia 02/25/2009  . Low back pain 10/04/2008  . Chronic allergic rhinitis 09/21/2008  . Obesity 06/30/2007  . ACID REFLUX DISEASE 06/30/2007    Past Surgical History:  Procedure Laterality Date  . COLONOSCOPY  11/20/2008  . KNEE ARTHROSCOPY Left 2004  . SHOULDER OPEN ROTATOR CUFF REPAIR Left 08/30/2012   Procedure: ROTATOR CUFF REPAIR SHOULDER OPEN, POSSIBLE GRAFT AND ANCHORS;  Surgeon: Jacki Cones, MD;  Location: [facility];  Service: Orthopedics;  Laterality: Left;  . TOTAL ABDOMINAL HYSTERECTOMY W/ BILATERAL SALPINGOOPHORECTOMY  1990's    OB History    No data available       Home Medications    Prior to Admission medications   Medication Sig Start Date End Date Taking? Authorizing Provider    aspirin 81 MG tablet Take 81 mg by mouth daily.      Historical Provider, MD  calcium-vitamin D (OSCAL WITH D) 500-200 MG-UNIT per tablet Take 1 tablet by mouth daily with breakfast.     Historical Provider, MD  chlorhexidine (PERIDEX) 0.12 % solution Use as directed 15 mLs in the mouth or throat 2 (two) times daily. 03/07/16   Gerhard Munch, MD  estradiol (ESTRACE) 0.5 MG tablet Take 0.5 mg by mouth daily.    Historical Provider, MD  fluticasone (FLONASE) 50 MCG/ACT nasal spray Place 2 sprays into both nostrils daily. 02/27/16   Myrlene Broker, MD  latanoprost (XALATAN) 0.005 % ophthalmic solution Place 1 drop into both eyes at bedtime.    Historical Provider, MD  meloxicam (MOBIC) 7.5 MG tablet Take 1 tablet (7.5 mg total) by mouth daily. 02/27/16   Myrlene Broker, MD  omeprazole (PRILOSEC) 40 MG capsule TAKE ONE CAPSULE BY MOUTH TWICE DAILY 30 MINUTES BEFORE FIRST AND LAST MEAL OF THE DAY 02/12/16   Nyoka Cowden, MD  oxyCODONE-acetaminophen (PERCOCET/ROXICET) 5-325 MG per tablet Take 1 tablet by mouth every 4 (four) hours as needed. (this has tylenol in it) 03/28/13   Nyoka Cowden, MD  penicillin v potassium (VEETID) 500 MG tablet Take 1 tablet (500 mg total) by mouth 3 (three) times daily. 03/07/16 03/14/16  Gerhard Munch, MD  potassium chloride SA (K-DUR,KLOR-CON) 20 MEQ tablet TAKE 1 TABLET BY MOUTH EVERY DAY 12/18/15  Myrlene BrokerElizabeth A Crawford, MD  spironolactone (ALDACTONE) 25 MG tablet Take 1 tablet (25 mg total) by mouth 2 (two) times daily. 10/08/15   Myrlene BrokerElizabeth A Crawford, MD    Family History Family History  Problem Relation Age of Onset  . Hypertension Mother   . Diabetes Mother   . Cancer Mother     in leg  . Colon cancer Neg Hx     Social History Social History  Substance Use Topics  . Smoking status: Never Smoker  . Smokeless tobacco: Never Used  . Alcohol use No     Allergies   Hydrocodone-acetaminophen and Tramadol   Review of Systems Review of Systems   Constitutional:       Per HPI, otherwise negative  HENT:       Per HPI, otherwise negative  Respiratory:       Per HPI, otherwise negative  Cardiovascular:       Per HPI, otherwise negative  Gastrointestinal: Negative for nausea and vomiting.  Endocrine:       Negative aside from HPI  Genitourinary:       Neg aside from HPI   Musculoskeletal:       Per HPI, otherwise negative  Skin: Negative.   Neurological: Negative for syncope.     Physical Exam Updated Vital Signs BP 141/73 (BP Location: Right Arm)   Pulse 75   Temp 99.3 F (37.4 C) (Oral)   Resp 16   Ht 5\' 2"  (1.575 m)   Wt 184 lb 1 oz (83.5 kg)   SpO2 100%   BMI 33.67 kg/m   Physical Exam  Constitutional: She is oriented to person, place, and time. She appears well-developed and well-nourished. No distress.  HENT:  Mouth/Throat: Uvula is midline and oropharynx is clear and moist.    R facial swelling w/o discoloration.  Eyes: Conjunctivae and EOM are normal.  Cardiovascular: Normal rate and regular rhythm.   Pulmonary/Chest: Effort normal and breath sounds normal. No stridor. No respiratory distress.  Abdominal: She exhibits no distension.  Musculoskeletal: She exhibits no edema.  Neurological: She is alert and oriented to person, place, and time. No cranial nerve deficit.  Skin: Skin is warm and dry.  Psychiatric: She has a normal mood and affect.  Nursing note and vitals reviewed.    ED Treatments / Results  Labs (all labs ordered are listed, but only abnormal results are displayed) Labs Reviewed - No data to display  EKG  EKG Interpretation None       Radiology No results found.  Procedures Procedures (including critical care time)  Medications Ordered in ED Medications  penicillin v potassium (VEETID) tablet 500 mg (not administered)     Initial Impression / Assessment and Plan / ED Course  I have reviewed the triage vital signs and the nursing notes.  Pertinent labs & imaging  results that were available during my care of the patient were reviewed by me and considered in my medical decision making (see chart for details).  Clinical Course    Elderly female presents with right facial swelling, obvious dental infection, but no fluctuance, no drainable lesion. No evidence for posterior oral pharyngeal spread, no asymmetry, no evidence for bacteremia, sepsis. Patient started on oral and topical antibiotic will follow-up with dental. Notably, the patient has allergies to multiple medications, and Tylenol is most appropriate analgesia.  Final Clinical Impressions(s) / ED Diagnoses   Final diagnoses:  Dental infection    New Prescriptions New Prescriptions   CHLORHEXIDINE (  PERIDEX) 0.12 % SOLUTION    Use as directed 15 mLs in the mouth or throat 2 (two) times daily.   PENICILLIN V POTASSIUM (VEETID) 500 MG TABLET    Take 1 tablet (500 mg total) by mouth 3 (three) times daily.     Gerhard Munch, MD 03/07/16 Silva Bandy    Gerhard Munch, MD 04/04/16 [phone]

## 2016-03-07 NOTE — ED Triage Notes (Signed)
Onset 1 week pt broke bottom right back molar.  Onset yesterday right side of jaw swelling.  No fevers.

## 2016-03-09 ENCOUNTER — Ambulatory Visit (INDEPENDENT_AMBULATORY_CARE_PROVIDER_SITE_OTHER): Payer: BC Managed Care – PPO | Admitting: *Deleted

## 2016-03-09 DIAGNOSIS — J309 Allergic rhinitis, unspecified: Secondary | ICD-10-CM

## 2016-03-17 ENCOUNTER — Telehealth: Payer: Self-pay | Admitting: Internal Medicine

## 2016-03-17 ENCOUNTER — Ambulatory Visit (INDEPENDENT_AMBULATORY_CARE_PROVIDER_SITE_OTHER): Payer: BC Managed Care – PPO | Admitting: *Deleted

## 2016-03-17 DIAGNOSIS — J309 Allergic rhinitis, unspecified: Secondary | ICD-10-CM

## 2016-03-17 NOTE — Telephone Encounter (Signed)
LM for pt x 1  Please schedule appt for pt in one of appt slots stated below. Thanks.

## 2016-03-17 NOTE — Telephone Encounter (Signed)
Pt is still driving a school bus and would like appt on a Monday, Tuesday or Thursday between 10:30 and 1:30 when she has a break.  Please advise

## 2016-03-17 NOTE — Telephone Encounter (Signed)
We can offer Tuesday 04/07/16 at 10:30am slot(8115min fine) or Thursday 04/23/16 at 11:15am slot(30 minutes). Thanks.

## 2016-03-17 NOTE — Telephone Encounter (Signed)
Pt can be seen 03-19-16 at 3:00pm slot(held in [facility]). Thanks.

## 2016-03-18 NOTE — Telephone Encounter (Signed)
Patient called back, she was given dates of appointments offered and chose 10/31 at 10:30 am.  Appointment was made.  Nothing further needed.

## 2016-03-18 NOTE — Telephone Encounter (Signed)
lmtcb x2 for pt. 

## 2016-03-23 ENCOUNTER — Ambulatory Visit (INDEPENDENT_AMBULATORY_CARE_PROVIDER_SITE_OTHER): Payer: BC Managed Care – PPO

## 2016-03-23 DIAGNOSIS — J309 Allergic rhinitis, unspecified: Secondary | ICD-10-CM | POA: Diagnosis not present

## 2016-03-30 ENCOUNTER — Ambulatory Visit (INDEPENDENT_AMBULATORY_CARE_PROVIDER_SITE_OTHER): Payer: BC Managed Care – PPO | Admitting: *Deleted

## 2016-03-30 DIAGNOSIS — J309 Allergic rhinitis, unspecified: Secondary | ICD-10-CM | POA: Diagnosis not present

## 2016-04-06 ENCOUNTER — Ambulatory Visit (INDEPENDENT_AMBULATORY_CARE_PROVIDER_SITE_OTHER): Payer: BC Managed Care – PPO | Admitting: *Deleted

## 2016-04-06 DIAGNOSIS — J309 Allergic rhinitis, unspecified: Secondary | ICD-10-CM

## 2016-04-07 ENCOUNTER — Ambulatory Visit (INDEPENDENT_AMBULATORY_CARE_PROVIDER_SITE_OTHER): Payer: BC Managed Care – PPO | Admitting: Internal Medicine

## 2016-04-07 ENCOUNTER — Encounter: Payer: Self-pay | Admitting: Internal Medicine

## 2016-04-07 VITALS — BP 116/68 | HR 65 | Ht 62.0 in | Wt 186.4 lb

## 2016-04-07 DIAGNOSIS — J4520 Mild intermittent asthma, uncomplicated: Secondary | ICD-10-CM | POA: Diagnosis not present

## 2016-04-07 DIAGNOSIS — J302 Other seasonal allergic rhinitis: Secondary | ICD-10-CM

## 2016-04-07 DIAGNOSIS — R05 Cough: Secondary | ICD-10-CM

## 2016-04-07 DIAGNOSIS — J309 Allergic rhinitis, unspecified: Secondary | ICD-10-CM

## 2016-04-07 DIAGNOSIS — R053 Chronic cough: Secondary | ICD-10-CM

## 2016-04-07 LAB — NITRIC OXIDE: NITRIC OXIDE: 9

## 2016-04-07 NOTE — Patient Instructions (Signed)
[place]- FENO    Dx chronic cough, allergic rhinitis  Sample Breo 100     Inhale 1 puff then rinse mouth well, once every day  Sample Dymista nasal spray   1-2 puffs each nostril once daily   As discussed, we will  Be closing the allergy vaccine program later this winter. You can certainly continue allergy shots until then. I usually suggest that my patients then wait to see how they do off of allergy shots. If you need to work with an Proofreaderallergist, then you could go to another office in town.

## 2016-04-07 NOTE — Assessment & Plan Note (Addendum)
This likely contributes to her chronic cough complaint Plan-try Dymista nasal spray to reduce postnasal drip. Try sample Breo 100 maintenance controller

## 2016-04-07 NOTE — Progress Notes (Signed)
02/07/13- 72 yoF never smoker followed by Dr Sherene SiresWert for cough attributed to Upper airway cough/ postnasal drip. Referred by MW; allergy labs done on 12-13-12- for allergy evaluation. Allergy Profile 12/13/12- Total IgE 806.1, broad elevations to environmental allergens. Allergy Skin test 06/14/13- Strong positive puncture reactions, no I.D tests. Given allegra. FENO 04/07/16- 9 WNL and not indicating allergic asthma  10/17/13-  72 yoF never smoker followed for Allergic rhinitis, Food allergy. Followed  by Dr Sherene SiresWert for cough attributed to Upper airway cough/ postnasal drip. Complicated by GERD, obesity FOLLOWS FOR: Pt states she can tell a huge difference being on allergy injections. Now building vaccine here at 1:5000. No problem with reactions. She has stayed in some, avoiding pollen. She is very pleased. Cough is gone. CXR 09/26/13 IMPRESSION:  1. No acute cardiopulmonary disease.  2. Stable cardiomegaly.  Electronically Signed  By: Maisie Fushomas Register  On: 09/26/2013 14:09   02/27/15- [age] yoF never smoker followed for Allergic rhinitis, Food allergy. Followed  by Dr Sherene SiresWert for cough attributed to Upper airway cough/ postnasal drip. Complicated by GERD, obesity Allergy vaccine  1:500     GH FOLLOWS FOR: pt states she is doing great. pt recievng allergy vaccine once a week, and working good for pt.  She considers allergy vaccine "a big help" although we hold at the highest tolerated dose 1:500. Chronic cough is better. Using Flonase and Astelin when needed.  04/07/2016-75 year old female never smoker followed for Allergic rhinitis, Food allergy, followed by Dr. Sherene SiresWert for cough attributed to upper airway cough/postnasal drip. Complicated by GERD, obesity Allergy Vaccine 1:500 GH FOLLOWS FOR: Pt states she is still on allergy vaccine and is helping her. Continues to have cough off and on. Recent hoarseness with change of weather. No nose or sinus discomfort to suggest sinus infection. Occasional wheeze if  labored. Ears drain and itch. She says allergy shots "help a whole lot" although we hold at 1:500 to avoid local reaction. Infrequent use of rescue inhaler without sleep disturbance. FENO 04/07/16- 9    WNL and not indicating allergic asthma  ROS-see HPI Constitutional:   No-   weight loss, night sweats, fevers, chills, fatigue, lassitude. HEENT:   No-  headaches, difficulty swallowing, tooth/dental problems, sore throat,       No- sneezing,itching, no-ear ache, + nasal congestion, +post nasal drip,  CV:  No-   chest pain, orthopnea, PND, swelling in lower extremities, anasarca, dizziness, palpitations Resp: No-   shortness of breath with exertion or at rest.              No-productive cough,  + non-productive cough,  No- coughing up of blood.              No-   change in color of mucus.  + wheezing.   Skin: No-   rash or lesions. GI:  No-   heartburn, indigestion, abdominal pain, nausea, vomiting, GU:  MS:  No-   joint pain or swelling.   Neuro-     nothing unusual Psych:  No- change in mood or affect. No depression or anxiety.  No memory loss.  OBJ- Physical Exam General- Alert, Oriented, Affect-appropriate, Distress- none acute Skin- rash-none, lesions- none, excoriation- none Lymphadenopathy- none Head- atraumatic            Eyes- Gross vision intact, PERRLA, conjunctivae and secretions clear. +periorbital edema            Ears- Hearing, canals-normal  Nose- + red mucosa, no-Septal dev, mucus, polyps, erosion, perforation , +TMs retracted            Throat- Mallampati III , mucosa clear , drainage- none, tonsils- atrophic, +dentures, + hoarse Neck- flexible , trachea midline, no stridor , thyroid nl, carotid no bruit Chest - symmetrical excursion , unlabored           Heart/CV- RRR , no murmur , no gallop  , no rub, nl s1 s2                           - JVD- none , edema- none, stasis changes- none, varices- none           Lung- clear to P&A, wheeze- none, cough-none ,  dullness-none, rub- none           Chest wall-  Abd-  Br/ Gen/ Rectal- Not done, not indicated Extrem- cyanosis- none, clubbing, none, atrophy- none, strength- nl Neuro- grossly intact to observation

## 2016-04-07 NOTE — Assessment & Plan Note (Signed)
She believes allergy shots help her although we never got very high concentration. We discussed anticipated closure of our allergy vaccine program this winter. I think she will want to transfer to another allergy practice in town for continuity of that care. She might choose to continue being followed at this office for chronic cough although Dr. Sherene SiresWert had released her back to her primary physician for this problem. Plan-continue continue allergy vaccine here until late winter when the program closes. Final plans at that time. Meanwhile try sample Dymista nasal spray

## 2016-04-13 ENCOUNTER — Ambulatory Visit (INDEPENDENT_AMBULATORY_CARE_PROVIDER_SITE_OTHER): Payer: BC Managed Care – PPO | Admitting: *Deleted

## 2016-04-13 ENCOUNTER — Other Ambulatory Visit: Payer: Self-pay | Admitting: Internal Medicine

## 2016-04-13 DIAGNOSIS — J309 Allergic rhinitis, unspecified: Secondary | ICD-10-CM | POA: Diagnosis not present

## 2016-04-21 ENCOUNTER — Ambulatory Visit (INDEPENDENT_AMBULATORY_CARE_PROVIDER_SITE_OTHER): Payer: BC Managed Care – PPO | Admitting: *Deleted

## 2016-04-21 DIAGNOSIS — J309 Allergic rhinitis, unspecified: Secondary | ICD-10-CM

## 2016-04-24 NOTE — Progress Notes (Signed)
Aram BeechamHey Liz, pt. Did not get a shot. She was out of vac. So she decided to stop now and see how she does. Asked Patrice to unarrive her and take her off the sch.. Which she did. Long story short don't charge for a shot.

## 2016-04-27 ENCOUNTER — Ambulatory Visit: Payer: BC Managed Care – PPO

## 2016-05-04 ENCOUNTER — Ambulatory Visit: Payer: BC Managed Care – PPO

## 2016-05-25 ENCOUNTER — Ambulatory Visit (HOSPITAL_COMMUNITY)
Admission: EM | Admit: 2016-05-25 | Discharge: 2016-05-25 | Disposition: A | Discharge status: Home / Self Care | Payer: Worker's Compensation | Attending: Family Medicine | Admitting: Family Medicine

## 2016-05-25 ENCOUNTER — Encounter (HOSPITAL_COMMUNITY): Payer: Self-pay | Admitting: *Deleted

## 2016-05-25 DIAGNOSIS — Z23 Encounter for immunization: Secondary | ICD-10-CM

## 2016-05-25 DIAGNOSIS — W503XXA Accidental bite by another person, initial encounter: Secondary | ICD-10-CM | POA: Diagnosis not present

## 2016-05-25 DIAGNOSIS — S61451A Open bite of right hand, initial encounter: Secondary | ICD-10-CM | POA: Diagnosis not present

## 2016-05-25 MED ORDER — TETANUS-DIPHTH-ACELL PERTUSSIS 5-2.5-18.5 LF-MCG/0.5 IM SUSP
0.5000 mL | Freq: Once | INTRAMUSCULAR | Status: AC
  Administered 2016-05-25: 0.5 mL via INTRAMUSCULAR

## 2016-05-25 MED ORDER — TETANUS-DIPHTH-ACELL PERTUSSIS 5-2.5-18.5 LF-MCG/0.5 IM SUSP
INTRAMUSCULAR | Status: AC
  Filled 2016-05-25: qty 0.5
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
## 2016-05-25 NOTE — Discharge Instructions (Signed)
Wash regularly with sponge provided. You had a tetanus booster. Return if any concerns

## 2016-05-25 NOTE — ED Notes (Signed)
Patient's right hand placed in CHG soak.
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
## 2016-06-04 DIAGNOSIS — H401213 Low-tension glaucoma, right eye, severe stage: Secondary | ICD-10-CM | POA: Diagnosis not present

## 2016-06-04 DIAGNOSIS — H401221 Low-tension glaucoma, left eye, mild stage: Secondary | ICD-10-CM | POA: Diagnosis not present

## 2016-06-04 DIAGNOSIS — Z961 Presence of intraocular lens: Secondary | ICD-10-CM | POA: Diagnosis not present

## 2016-06-04 DIAGNOSIS — H25812 Combined forms of age-related cataract, left eye: Secondary | ICD-10-CM | POA: Diagnosis not present

## 2016-06-16 DIAGNOSIS — Z01419 Encounter for gynecological examination (general) (routine) without abnormal findings: Secondary | ICD-10-CM | POA: Diagnosis not present

## 2016-06-16 DIAGNOSIS — N959 Unspecified menopausal and perimenopausal disorder: Secondary | ICD-10-CM | POA: Diagnosis not present

## 2016-06-16 DIAGNOSIS — Z1389 Encounter for screening for other disorder: Secondary | ICD-10-CM | POA: Diagnosis not present

## 2016-06-16 DIAGNOSIS — Z6835 Body mass index (BMI) 35.0-35.9, adult: Secondary | ICD-10-CM | POA: Diagnosis not present

## 2016-06-22 DIAGNOSIS — H2512 Age-related nuclear cataract, left eye: Secondary | ICD-10-CM | POA: Diagnosis not present

## 2016-06-22 DIAGNOSIS — H25012 Cortical age-related cataract, left eye: Secondary | ICD-10-CM | POA: Diagnosis not present

## 2016-07-22 DIAGNOSIS — M545 Low back pain: Secondary | ICD-10-CM | POA: Diagnosis not present

## 2016-07-22 DIAGNOSIS — M549 Dorsalgia, unspecified: Secondary | ICD-10-CM | POA: Diagnosis not present

## 2016-07-22 DIAGNOSIS — R300 Dysuria: Secondary | ICD-10-CM | POA: Diagnosis not present

## 2016-07-24 ENCOUNTER — Other Ambulatory Visit: Payer: Self-pay | Admitting: Internal Medicine

## 2016-07-24 ENCOUNTER — Encounter: Payer: Self-pay | Admitting: Internal Medicine

## 2016-07-24 ENCOUNTER — Ambulatory Visit (INDEPENDENT_AMBULATORY_CARE_PROVIDER_SITE_OTHER): Payer: BC Managed Care – PPO | Admitting: Internal Medicine

## 2016-07-24 DIAGNOSIS — M7061 Trochanteric bursitis, right hip: Secondary | ICD-10-CM | POA: Diagnosis not present

## 2016-07-24 MED ORDER — CYCLOBENZAPRINE HCL 5 MG PO TABS: 5.0000 mg | ORAL_TABLET | Freq: Three times a day (TID) | ORAL | 0 refills | Status: DC | PRN

## 2016-07-24 MED ORDER — PREDNISONE 20 MG PO TABS: 40.0000 mg | ORAL_TABLET | Freq: Every day | ORAL | 0 refills | Status: DC

## 2016-07-24 NOTE — Patient Instructions (Signed)
We have sent in the prednisone medicine to help the pain. Take 2 pills daily for 5 days then stop.  We have also sent in flexeril for pain to help until the prednisone can work.    Trochanteric Bursitis Rehab Ask your health care provider which exercises are safe for you. Do exercises exactly as told by your health care provider and adjust them as directed. It is normal to feel mild stretching, pulling, tightness, or discomfort as you do these exercises, but you should stop right away if you feel sudden pain or your pain gets worse.Do not begin these exercises until told by your health care provider. Stretching exercises These exercises warm up your muscles and joints and improve the movement and flexibility of your hip. These exercises also help to relieve pain and stiffness. Exercise A: Iliotibial band stretch 1. Lie on your side with your left / right leg in the top position. 2. Bend your left / right knee and grab your ankle. 3. Slowly bring your knee back so your thigh is behind your body. 4. Slowly lower your knee toward the floor until you feel a gentle stretch on the outside of your left / right thigh. If you do not feel a stretch and your knee will not fall farther, place the heel of your other foot on top of your outer knee and pull your thigh down farther. 5. Hold this position for __________ seconds. 6. Slowly return to the starting position. Repeat __________ times. Complete this exercise __________ times a day. Strengthening exercises These exercises build strength and endurance in your hip and pelvis. Endurance is the ability to use your muscles for a long time, even after they get tired. Exercise B: Bridge (hip extensors) 1. Lie on your back on a firm surface with your knees bent and your feet flat on the floor. 2. Tighten your buttocks muscles and lift your buttocks off the floor until your trunk is level with your thighs. You should feel the muscles working in your buttocks  and the back of your thighs. If this exercise is too easy, try doing it with your arms crossed over your chest. 3. Hold this position for __________ seconds. 4. Slowly return to the starting position. 5. Let your muscles relax completely between repetitions. Repeat __________ times. Complete this exercise __________ times a day. Exercise C: Squats (knee extensors and  quadriceps) 1. Stand in front of a table, with your feet and knees pointing straight ahead. You may rest your hands on the table for balance but not for support. 2. Slowly bend your knees and lower your hips like you are going to sit in a chair.  Keep your weight over your heels, not over your toes.  Keep your lower legs upright so they are parallel with the table legs.  Do not let your hips go lower than your knees.  Do not bend lower than told by your health care provider.  If your hip pain increases, do not bend as low. 3. Hold this position for __________ seconds. 4. Slowly push with your legs to return to standing. Do not use your hands to pull yourself to standing. Repeat __________ times. Complete this exercise __________ times a day. Exercise D: Hip hike 1. Stand sideways on a bottom step. Stand on your left / right leg with your other foot unsupported next to the step. You can hold onto the railing or wall if needed for balance. 2. Keeping your knees straight and your torso  square, lift your left / right hip up toward the ceiling. 3. Hold this position for __________ seconds. 4. Slowly let your left / right hip lower toward the floor, past the starting position. Your foot should get closer to the floor. Do not lean or bend your knees. Repeat __________ times. Complete this exercise __________ times a day. Exercise E: Single leg stand 1. Stand near a counter or door frame that you can hold onto for balance as needed. It is helpful to stand in front of a mirror for this exercise so you can watch your hip. 2. Squeeze  your left / right buttock muscles then lift up your other foot. Do not let your left / right hip push out to the side. 3. Hold this position for __________ seconds. Repeat __________ times. Complete this exercise __________ times a day. This information is not intended to replace advice given to you by your health care provider. Make sure you discuss any questions you have with your health care provider. Document Released: 07/02/2004 Document Revised: 01/30/2016 Document Reviewed: 05/10/2015 Elsevier Interactive Patient Education  2017 [employer].

## 2016-07-24 NOTE — Assessment & Plan Note (Signed)
Rx for prednisone and flexeril for pain. Given stretching exercises to help with recovery and work note.

## 2016-07-24 NOTE — Progress Notes (Signed)
   Subjective:    Patient ID: Catherine Hoffman, female    DOB: 06/03/1941, 76 y.o.   MRN: [id number]  HPI The patient is a 76 YO female coming in for acute hip pain. Started on Monday and is severe. She is not able to walk well on it. 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 She denies rash in the area. She has the pain on the side and thigh region. No pain really in the groin. She went to her urologist worried about kidney stones earlier this week and they ran tests and told her the kidney stones were not bothering her.   Review of Systems  Constitutional: Positive for activity change. Negative for chills, diaphoresis, fatigue and unexpected weight change.  Respiratory: Negative for cough, chest tightness and shortness of breath.   Cardiovascular: Negative for chest pain, palpitations and leg swelling.  Gastrointestinal: Negative for abdominal distention, abdominal pain, constipation, diarrhea, nausea and vomiting.  Musculoskeletal: Positive for arthralgias, gait problem and myalgias. Negative for back pain and joint swelling.  Skin: Negative.   Neurological: Negative for dizziness, weakness, light-headedness, numbness and headaches.      Objective:   Physical Exam  Constitutional: She is oriented to person, place, and time. She appears well-developed and well-nourished.  HENT:  Head: Normocephalic and atraumatic.  Eyes: EOM are normal.  Neck: Normal range of motion.  Cardiovascular: Normal rate and regular rhythm.   Pulmonary/Chest: Effort normal and breath sounds normal.  Abdominal: Soft.  Musculoskeletal: She exhibits tenderness. She exhibits no edema.  Pain on the lateral right thigh without tenderness in the groin.   Neurological: She is alert and oriented to person, place, and time. Coordination abnormal.  Slow gait  Skin: Skin is warm and dry.   Vitals:   07/24/16 0920  BP: 122/78  Pulse: 70  Temp: 98.1 F (36.7 C)  TempSrc: Oral  SpO2: 98%    Weight: 191 lb (86.6 kg)  Height: 5\' 2"  (1.575 m)      Assessment & Plan:

## 2016-07-24 NOTE — Progress Notes (Signed)
Pre visit review using our clinic review tool, if applicable. No additional management support is needed unless otherwise documented below in the visit note. 

## 2016-07-27 ENCOUNTER — Telehealth: Payer: Self-pay | Admitting: *Deleted

## 2016-07-27 NOTE — Telephone Encounter (Signed)
Rec'd call pt states she is still hurting suppose to go back to work tomorrow but don't think she will be able too. Still taking prednisone have 1 day left. Requesting excuse note for work, and MD recommendation...Raechel Chute/lmb

## 2016-07-27 NOTE — Telephone Encounter (Signed)
If no improvement recommend repeat visit. If she is improving she can try return to work.

## 2016-07-27 NOTE — Telephone Encounter (Signed)
Notified pt w/MD response. Made appt for Wednesday @ 8:30...Raechel Chute/lmb

## 2016-07-29 ENCOUNTER — Encounter: Payer: Self-pay | Admitting: Internal Medicine

## 2016-07-29 ENCOUNTER — Ambulatory Visit (INDEPENDENT_AMBULATORY_CARE_PROVIDER_SITE_OTHER)
Admission: RE | Admit: 2016-07-29 | Discharge: 2016-07-29 | Disposition: A | Discharge status: Home / Self Care | Payer: BC Managed Care – PPO | Source: Ambulatory Visit | Attending: Internal Medicine | Admitting: Internal Medicine

## 2016-07-29 ENCOUNTER — Ambulatory Visit: Payer: Self-pay | Admitting: Internal Medicine

## 2016-07-29 ENCOUNTER — Ambulatory Visit (INDEPENDENT_AMBULATORY_CARE_PROVIDER_SITE_OTHER): Payer: BC Managed Care – PPO | Admitting: Internal Medicine

## 2016-07-29 ENCOUNTER — Other Ambulatory Visit: Payer: Self-pay | Admitting: Internal Medicine

## 2016-07-29 VITALS — BP 118/70 | HR 67 | Temp 98.3°F | Ht 62.0 in | Wt 192.8 lb

## 2016-07-29 DIAGNOSIS — M25551 Pain in right hip: Secondary | ICD-10-CM

## 2016-07-29 DIAGNOSIS — M7061 Trochanteric bursitis, right hip: Secondary | ICD-10-CM | POA: Diagnosis not present

## 2016-07-29 MED ORDER — PREDNISONE 20 MG PO TABS: 40.0000 mg | ORAL_TABLET | Freq: Every day | ORAL | 0 refills | Status: DC

## 2016-07-29 NOTE — Progress Notes (Signed)
   Subjective:    Patient ID: Catherine Hoffman, female    DOB: [birth year], 76 y.o.   MRN: [id number]  HPI The patient is a 76 YO female coming in for follow up of right hip pain. She has been treated with prednisone for likely trochanteric bursitis. She has had no overall improvement although the prednisone does help for some time after taking. The pain has moved more to the groin region. No fever or chills. No falls or injury since the last visit. Taken the muscle relaxers and they help but make her sleepy. Still ambulating with walker at home to take pressure off her leg and right leg giving out on her sometimes if she puts too much pressure on it.   Review of Systems  Constitutional: Positive for activity change and fatigue. Negative for appetite change, chills, fever and unexpected weight change.  Respiratory: Negative.   Cardiovascular: Negative.   Gastrointestinal: Negative.   Musculoskeletal: Positive for arthralgias, gait problem and myalgias. Negative for back pain, joint swelling, neck pain and neck stiffness.  Skin: Negative.   Neurological: Positive for weakness. Negative for dizziness, seizures, facial asymmetry, light-headedness and numbness.      Objective:   Physical Exam  Constitutional: She is oriented to person, place, and time. She appears well-developed and well-nourished.  HENT:  Head: Normocephalic and atraumatic.  Eyes: EOM are normal.  Neck: Normal range of motion.  Cardiovascular: Normal rate and regular rhythm.   Pulmonary/Chest: Effort normal and breath sounds normal.  Abdominal: Soft.  Musculoskeletal: She exhibits tenderness.  Pain on the lateral thigh and radiating into the right groin area.   Neurological: She is alert and oriented to person, place, and time. Coordination abnormal.  Slow to rise and hesitating gait.   Skin: Skin is warm and dry.   Vitals:   07/29/16 0830  BP: 118/70  Pulse: 67  Temp: 98.3 F (36.8 C)  TempSrc: Oral  SpO2: 99%    Weight: 192 lb 12 oz (87.4 kg)  Height: 5\' 2"  (1.575 m)      Assessment & Plan:

## 2016-07-29 NOTE — Assessment & Plan Note (Signed)
Has not responded to prednisone therapy. Given new radiation to the groin area checking x-ray for arthritis in the hip. If significant arthritis will refer to orthopedics and if not then will refer to sports medicine for injection.

## 2016-07-29 NOTE — Progress Notes (Signed)
Pre visit review using our clinic review tool, if applicable. No additional management support is needed unless otherwise documented below in the visit note. 

## 2016-07-29 NOTE — Patient Instructions (Addendum)
We will check the x-ray today to make sure the hip is okay.   We will get you in with a specialist to get the injection of that if needed.   It is okay to take the meloxicam and tylenol with the prednisone for pain if needed.

## 2016-08-03 DIAGNOSIS — M25551 Pain in right hip: Secondary | ICD-10-CM | POA: Diagnosis not present

## 2016-08-03 DIAGNOSIS — M545 Low back pain: Secondary | ICD-10-CM | POA: Diagnosis not present

## 2016-08-13 DIAGNOSIS — M25551 Pain in right hip: Secondary | ICD-10-CM | POA: Diagnosis not present

## 2016-10-12 DIAGNOSIS — M25551 Pain in right hip: Secondary | ICD-10-CM | POA: Diagnosis not present

## 2016-10-20 DIAGNOSIS — M545 Low back pain: Secondary | ICD-10-CM | POA: Diagnosis not present

## 2016-10-20 DIAGNOSIS — M25551 Pain in right hip: Secondary | ICD-10-CM | POA: Diagnosis not present

## 2016-10-21 ENCOUNTER — Ambulatory Visit (INDEPENDENT_AMBULATORY_CARE_PROVIDER_SITE_OTHER): Payer: BC Managed Care – PPO | Admitting: Internal Medicine

## 2016-10-21 ENCOUNTER — Encounter: Payer: Self-pay | Admitting: Internal Medicine

## 2016-10-21 ENCOUNTER — Other Ambulatory Visit (INDEPENDENT_AMBULATORY_CARE_PROVIDER_SITE_OTHER): Payer: BC Managed Care – PPO

## 2016-10-21 VITALS — BP 120/74 | HR 76 | Temp 98.0°F | Resp 12 | Ht 62.0 in | Wt 186.0 lb

## 2016-10-21 DIAGNOSIS — I872 Venous insufficiency (chronic) (peripheral): Secondary | ICD-10-CM

## 2016-10-21 LAB — COMPREHENSIVE METABOLIC PANEL
ALT: 10 U/L (ref 0–35)
AST: 16 U/L (ref 0–37)
Albumin: 4.1 g/dL (ref 3.5–5.2)
Alkaline Phosphatase: 75 U/L (ref 39–117)
BILIRUBIN TOTAL: 0.6 mg/dL (ref 0.2–1.2)
BUN: 17 mg/dL (ref 6–23)
CO2: 25 meq/L (ref 19–32)
Calcium: 9.5 mg/dL (ref 8.4–10.5)
Chloride: 109 mEq/L (ref 96–112)
Creatinine, Ser: 0.97 mg/dL (ref 0.40–1.20)
GFR: 71.86 mL/min (ref >60.00)
GLUCOSE: 95 mg/dL (ref 70–99)
POTASSIUM: 3.9 meq/L (ref 3.5–5.1)
Sodium: 141 mEq/L (ref 135–145)
Total Protein: 6.9 g/dL (ref 6.0–8.3)

## 2016-10-21 LAB — LIPID PANEL
CHOLESTEROL: 205 mg/dL — AB (ref 0–200)
HDL: 48.1 mg/dL (ref >39.00)
LDL CALC: 142 mg/dL — AB (ref 0–99)
NonHDL: 156.87
Total CHOL/HDL Ratio: 4
Triglycerides: 74 mg/dL (ref 0.0–149.0)
VLDL: 14.8 mg/dL (ref 0.0–40.0)

## 2016-10-21 LAB — BRAIN NATRIURETIC PEPTIDE: Pro B Natriuretic peptide (BNP): 23 pg/mL (ref 0.0–100.0)

## 2016-10-21 NOTE — Progress Notes (Signed)
   Subjective:    Patient ID: Catherine Hoffman, female    DOB: May 16, 1941, 76 y.o.   MRN: [id number]  HPI The patient is a 76 YO female coming in for swelling of the legs. She has had this for about 2 weeks now. Worse in the evening and better in the morning. She does not have any diet changes in the last 2 weeks. She tries not to eat a lot of salt. 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 She bought some compression stockings but has not tried them yet. She denies putting her feet up. No rash or pain in the legs.   Review of Systems  Constitutional: Negative.   Respiratory: Negative.   Cardiovascular: Positive for leg swelling. Negative for chest pain and palpitations.  Gastrointestinal: Negative.   Musculoskeletal: Negative.   Skin: Negative.   Neurological: Negative.       Objective:   Physical Exam  Constitutional: She is oriented to person, place, and time. She appears well-developed and well-nourished.  HENT:  Head: Normocephalic and atraumatic.  Eyes: EOM are normal.  Neck: Normal range of motion.  Cardiovascular: Normal rate and regular rhythm.   Pulmonary/Chest: Effort normal and breath sounds normal.  Abdominal: Soft.  Musculoskeletal: She exhibits edema.  1+ edema to mid shins bilaterally, non-pitting edema in the feet bilaterally.   Neurological: She is alert and oriented to person, place, and time.  Skin: Skin is warm and dry.   Vitals:   10/21/16 1011  BP: 120/74  Pulse: 76  Resp: 12  Temp: 98 F (36.7 C)  TempSrc: Oral  SpO2: 98%  Weight: 186 lb (84.4 kg)  Height: 5\' 2"  (1.575 m)      Assessment & Plan:

## 2016-10-21 NOTE — Assessment & Plan Note (Signed)
We talked about the etiology of venous insufficiency which is likely diagnosis. Checking CMP and BNP to rule out alternative cause. No symptoms of volume overload or CHF. Advised to use the compression stockings and elevate legs when possible and stay well hydrated if able.

## 2016-10-21 NOTE — Patient Instructions (Signed)
We will check the labs today and call you back with the results.   Wear the compression stockings as often as you can for the swelling.    Chronic Venous Insufficiency Chronic venous insufficiency, also called venous stasis, is a condition that prevents blood from being pumped effectively through the veins in your legs. Blood may no longer be pumped effectively from the legs back to the heart. This condition can range from mild to severe. With proper treatment, you should be able to continue with an active life. What are the causes? Chronic venous insufficiency occurs when the vein walls become stretched, weakened, or damaged, or when valves within the vein are damaged. Some common causes of this include:  High blood pressure inside the veins (venous hypertension).  Increased blood pressure in the leg veins from long periods of sitting or standing.  A blood clot that blocks blood flow in a vein (deep vein thrombosis, DVT).  Inflammation of a vein (phlebitis) that causes a blood clot to form.  Tumors in the pelvis that cause blood to back up. What increases the risk? The following factors may make you more likely to develop this condition:  Having a family history of this condition.  Obesity.  Pregnancy.  Living without enough physical activity or exercise (sedentary lifestyle).  Smoking.  Having a job that requires long periods of standing or sitting in one place.  Being a certain age. Women in their 3640s and 1650s and men in their 2870s are more likely to develop this condition. What are the signs or symptoms? Symptoms of this condition include:  Veins that are enlarged, bulging, or twisted (varicose veins).  Skin breakdown or ulcers.  Reddened or discolored skin on the front of the leg.  Brown, smooth, tight, and painful skin just above the ankle, usually on the inside of the leg (lipodermatosclerosis).  Swelling. How is this diagnosed? This condition may be diagnosed  based on:  Your medical history.  A physical exam.  Tests, such as:  A procedure that creates an image of a blood vessel and nearby organs and provides information about blood flow through the blood vessel (duplex ultrasound).  A procedure that tests blood flow (plethysmography).  A procedure to look at the veins using X-ray and dye (venogram). How is this treated? The goals of treatment are to help you return to an active life and to minimize pain or disability. Treatment depends on the severity of your condition, and it may include:  Wearing compression stockings. These can help relieve symptoms and help prevent your condition from getting worse. However, they do not cure the condition.  Sclerotherapy. This is a procedure involving an injection of a material that "dissolves" damaged veins.  Surgery. This may involve:  Removing a diseased vein (vein stripping).  Cutting off blood flow through the vein (laser ablation surgery).  Repairing a valve. Follow these instructions at home:  Wear compression stockings as told by your health care provider. These stockings help to prevent blood clots and reduce swelling in your legs.  Take over-the-counter and prescription medicines only as told by your health care provider.  Stay active by exercising, walking, or doing different activities. Ask your health care provider what activities are safe for you and how much exercise you need.  Drink enough fluid to keep your urine clear or pale yellow.  Do not use any products that contain nicotine or tobacco, such as cigarettes and e-cigarettes. If you need help quitting, ask your health  care provider.  Keep all follow-up visits as told by your health care provider. This is important. Contact a health care provider if:  You have redness, swelling, or more pain in the affected area.  You see a red streak or line that extends up or down from the affected area.  You have skin breakdown or a  loss of skin in the affected area, even if the breakdown is small.  You get an injury in the affected area. Get help right away if:  You get an injury and an open wound in the affected area.  You have severe pain that does not get better with medicine.  You have sudden numbness or weakness in the foot or ankle below the affected area, or you have trouble moving your foot or ankle.  You have a fever and you have worse or persistent symptoms.  You have chest pain.  You have shortness of breath. Summary  Chronic venous insufficiency, also called venous stasis, is a condition that prevents blood from being pumped effectively through the veins in your legs.  Chronic venous insufficiency occurs when the vein walls become stretched, weakened, or damaged, or when valves within the vein are damaged.  Treatment for this condition depends on how severe your condition is, and it may involve wearing compression stockings or having a procedure.  Make sure you stay active by exercising, walking, or doing different activities. Ask your health care provider what activities are safe for you and how much exercise you need. This information is not intended to replace advice given to you by your health care provider. Make sure you discuss any questions you have with your health care provider. Document Released: 09/28/2006 Document Revised: 04/13/2016 Document Reviewed: 04/13/2016 Elsevier Interactive Patient Education  2017 [employer].

## 2016-10-22 DIAGNOSIS — M25551 Pain in right hip: Secondary | ICD-10-CM | POA: Diagnosis not present

## 2016-11-04 ENCOUNTER — Other Ambulatory Visit: Payer: Self-pay | Admitting: Internal Medicine

## 2016-11-09 DIAGNOSIS — M47817 Spondylosis without myelopathy or radiculopathy, lumbosacral region: Secondary | ICD-10-CM | POA: Diagnosis not present

## 2016-11-09 DIAGNOSIS — M545 Low back pain: Secondary | ICD-10-CM | POA: Diagnosis not present

## 2016-11-19 DIAGNOSIS — M545 Low back pain: Secondary | ICD-10-CM | POA: Diagnosis not present

## 2016-11-19 DIAGNOSIS — M47817 Spondylosis without myelopathy or radiculopathy, lumbosacral region: Secondary | ICD-10-CM | POA: Diagnosis not present

## 2016-11-25 DIAGNOSIS — Z961 Presence of intraocular lens: Secondary | ICD-10-CM | POA: Diagnosis not present

## 2016-11-25 DIAGNOSIS — H401112 Primary open-angle glaucoma, right eye, moderate stage: Secondary | ICD-10-CM | POA: Diagnosis not present

## 2016-11-25 DIAGNOSIS — H401121 Primary open-angle glaucoma, left eye, mild stage: Secondary | ICD-10-CM | POA: Diagnosis not present

## 2016-11-26 ENCOUNTER — Encounter: Payer: Self-pay | Admitting: Podiatry

## 2016-11-26 ENCOUNTER — Ambulatory Visit (INDEPENDENT_AMBULATORY_CARE_PROVIDER_SITE_OTHER): Payer: BC Managed Care – PPO

## 2016-11-26 ENCOUNTER — Ambulatory Visit (INDEPENDENT_AMBULATORY_CARE_PROVIDER_SITE_OTHER): Payer: BC Managed Care – PPO | Admitting: Podiatry

## 2016-11-26 VITALS — BP 132/68 | HR 79 | Resp 16 | Ht 62.0 in | Wt 180.0 lb

## 2016-11-26 DIAGNOSIS — M2010 Hallux valgus (acquired), unspecified foot: Secondary | ICD-10-CM

## 2016-11-26 DIAGNOSIS — M2040 Other hammer toe(s) (acquired), unspecified foot: Secondary | ICD-10-CM

## 2016-11-26 NOTE — Progress Notes (Signed)
Subjective:    Patient ID: Catherine Hoffman, female   DOB: 76 y.o.   MRN: [id number]   HPI patient presents stating she has painful bunion deformity that she needs to get fixed as soon as possible due to her being of the summer first we'll schedule and are her right one also hurts but the left one is worse. Also complains about the second toe    Review of Systems  All other systems reviewed and are negative.       Objective:  Physical Exam  Cardiovascular: Intact distal pulses.   Musculoskeletal: Normal range of motion.  Neurological: She is alert.  Skin: Skin is warm.  Nursing note and vitals reviewed.  neurovascular status intact muscle strength adequate range of motion within normal limits with patient found to have large hyperostosis medial aspect first metatarsal head left over right with deviation the hallux against second toe with moderate elevation of the second toe with rigid contracture noted at the metatarsophalangeal joint. Patient's noted to have good digital perfusion is well oriented 3 and has negative Homans sign     Assessment:   Significant structural bunion deformity left over right with redness pain and deviation the hallux against second toe with moderate elevation second toe bilateral      Plan:    H&P condition reviewed and explained with patient. I do think that there is significant deformity and I discussed treatment options she wants surgery. I've recommended distal osteotomy explaining this may not completely fix the deformity but I think it'll work well for her and her age and her risk factors. She wants surgery and wants to go over consent form today she needs to get it done as soon as possible and I allowed her to read consent form reviewing alternative treatments complications associated with this procedure. Patient attends all risk and signs consent form for bunionectomy left and digital fusion second left and hopefully the right foot can be done within a  month after the left foot. Patient was dispensed air fracture walker with all instructions on usage and I want her to used to it prior to surgery and practice wearing it and also understands recovery can take upwards 6 months to one year  X-rays indicate significant structural bunion deformity left with elevation of the intermetatarsal angle and also deformity right with elevation of the intermetatarsal angle. Digital elevation digit 2 bilateral

## 2016-11-26 NOTE — Progress Notes (Signed)
   Subjective:    Patient ID: Catherine Hoffman, female    DOB: February 24, 1941, 76 y.o.   MRN: [id number]  HPI Chief Complaint  Patient presents with  . Foot Pain    Bilateral; bunions; pt stated, "Left foot hurts more than the right foot"; x2+ yrs  . Callouses    Bilateral; medial side-below great toe      Review of Systems  HENT: Positive for sinus pain, sneezing and tinnitus.   Eyes: Positive for itching.  Cardiovascular: Positive for leg swelling.  Musculoskeletal: Positive for back pain.  Allergic/Immunologic: Positive for food allergies.  All other systems reviewed and are negative.      Objective:   Physical Exam        Assessment & Plan:

## 2016-11-26 NOTE — Patient Instructions (Addendum)
Bunion A bunion is a bump on the base of the big toe that forms when the bones of the big toe joint move out of position. Bunions may be small at first, but they often get larger over time. The can make walking painful. What are the causes? A bunion may be caused by:  Wearing narrow or pointed shoes that force the big toe to press against the other toes.  Abnormal foot development that causes the foot to roll inward (pronate).  Changes in the foot that are caused by certain diseases, such as rheumatoid arthritis and polio.  A foot injury.  What increases the risk? The following factors may make you more likely to develop this condition:  Wearing shoes that squeeze the toes together.  Having certain diseases, such as: ? Rheumatoid arthritis. ? Polio. ? Cerebral palsy.  Having family members who have bunions.  Being born with a foot deformity, such as flat feet or low arches.  Doing activities that put a lot of pressure on the feet, such as ballet dancing.  What are the signs or symptoms? The main symptom of a bunion is a noticeable bump on the big toe. Other symptoms may include:  Pain.  Swelling around the big toe.  Redness and inflammation.  Thick or hardened skin on the big toe or between the toes.  Stiffness or loss of motion in the big toe.  Trouble with walking.  How is this diagnosed? A bunion may be diagnosed based on your symptoms, medical history, and activities. You may have tests, such as:  X-rays. These allow your health care provider to check the position of the bones in your foot and look for damage to your joint. They also help your health care provider to determine the severity of your bunion and the best way to treat it.  Joint aspiration. In this test, a sample of fluid is removed from the toe joint. This test, which may be done if you are in a lot of pain, helps to rule out diseases that cause painful swelling of the joints, such as  arthritis.  How is this treated? There is no cure for a bunion, but treatment can help to prevent a bunion from getting worse. Treatment depends on the severity of your symptoms. Your health care provider may recommend:  Wearing shoes that have a wide toe box.  Using bunion pads to cushion the affected area.  Taping your toes together to keep them in a normal position.  Placing a device inside your shoe (orthotics) to help reduce pressure on your toe joint.  Taking medicine to ease pain, inflammation, and swelling.  Applying heat or ice to the affected area.  Doing stretching exercises.  Surgery to remove scar tissue and move the toes back into their normal position. This treatment is rare.  Follow these instructions at home:  Support your toe joint with proper footwear, shoe padding, or taping as told by your health care provider.  Take over-the-counter and prescription medicines only as told by your health care provider.  If directed, apply ice to the injured area: ? Put ice in a plastic bag. ? Place a towel between your skin and the bag. ? Leave the ice on for 20 minutes, 2-3 times per day.  If directed, apply heat to the affected area before you exercise. Use the heat source that your health care provider recommends, such as a moist heat pack or a heating pad. ? Place a towel between your   skin and the heat source. ? Leave the heat on for 20-30 minutes. ? Remove the heat if your skin turns bright red. This is especially important if you are unable to feel pain, heat, or cold. You may have a greater risk of getting burned.  Do exercises as told by your health care provider.  Keep all follow-up visits as told by your health care provider. Contact a health care provider if:  Your symptoms get worse.  Your symptoms do not improve in 2 weeks. Get help right away if:  You have severe pain and trouble with walking. This information is not intended to replace advice given  to you by your health care provider. Make sure you discuss any questions you have with your health care provider. Document Released: 05/25/2005 Document Revised: 10/31/2015 Document Reviewed: 12/23/2014 Elsevier Interactive Patient Education  2018 [employer]   Pre-Operative Instructions  Congratulations, you have decided to take an important step to improving your quality of life.  You can be assured that the doctors of [facility] will be with you every step of the way.  1. Plan to be at the surgery center/hospital at least 1 (one) hour prior to your scheduled time unless otherwise directed by the surgical center/hospital staff.  You must have a responsible adult accompany you, remain during the surgery and drive you home.  Make sure you have directions to the surgical center/hospital and know how to get there on time. 2. For hospital based surgery you will need to obtain a history and physical form from your family physician within 1 month prior to the date of surgery- we will give you a form for you primary physician.  3. We make every effort to accommodate the date you request for surgery.  There are however, times where surgery dates or times have to be moved.  We will contact you as soon as possible if a change in schedule is required.   4. No Aspirin/Ibuprofen for one week before surgery.  If you are on aspirin, any non-steroidal anti-inflammatory medications (Mobic, Aleve, Ibuprofen) you should stop taking it 7 days prior to your surgery.  You make take Tylenol  For pain prior to surgery.  5. Medications- If you are taking daily heart and blood pressure medications, seizure, reflux, allergy, asthma, anxiety, pain or diabetes medications, make sure the surgery center/hospital is aware before the day of surgery so they may notify you which medications to take or avoid the day of surgery. 6. No food or drink after midnight the night before surgery unless directed otherwise by surgical  center/hospital staff. 7. No alcoholic beverages 24 hours prior to surgery.  No smoking 24 hours prior to or 24 hours after surgery. 8. Wear loose pants or shorts- loose enough to fit over bandages, boots, and casts. 9. No slip on shoes, sneakers are best. 10. Bring your boot with you to the surgery center/hospital.  Also bring crutches or a walker if your physician has prescribed it for you.  If you do not have this equipment, it will be provided for you after surgery. 11. If you have not been contracted by the surgery center/hospital by the day before your surgery, call to confirm the date and time of your surgery. 12. Leave-time from work may vary depending on the type of surgery you have.  Appropriate arrangements should be made prior to surgery with your employer. 13. Prescriptions will be provided immediately following surgery by your doctor.  Have these filled   as soon as possible after surgery and take the medication as directed. 14. Remove nail polish on the operative foot. 15. Wash the night before surgery.  The night before surgery wash the foot and leg well with the antibacterial soap provided and water paying special attention to beneath the toenails and in between the toes.  Rinse thoroughly with water and dry well with a towel.  Perform this wash unless told not to do so by your physician.  Enclosed: 1 Ice pack (please put in freezer the night before surgery)   1 Hibiclens skin cleaner   Pre-op Instructions  If you have any questions regarding the instructions, do not hesitate to call our office.  [place]: [address] [place], [place] [address] [phone]  [place]: [address] [place], [place] [address] [phone]  [place]: [address]  [place], [place] [address] [phone]   Dr. Norman Regal DPM, Dr. Matthew Wagoner DPM, Dr. M. Todd Hyatt DPM, Dr. Titorya Stover DPM  

## 2016-11-27 ENCOUNTER — Other Ambulatory Visit: Payer: Self-pay | Admitting: Internal Medicine

## 2016-12-01 DIAGNOSIS — M47817 Spondylosis without myelopathy or radiculopathy, lumbosacral region: Secondary | ICD-10-CM | POA: Diagnosis not present

## 2016-12-01 DIAGNOSIS — M545 Low back pain: Secondary | ICD-10-CM | POA: Diagnosis not present

## 2016-12-01 DIAGNOSIS — M5416 Radiculopathy, lumbar region: Secondary | ICD-10-CM | POA: Diagnosis not present

## 2016-12-08 ENCOUNTER — Encounter: Payer: Self-pay | Admitting: Podiatry

## 2016-12-08 DIAGNOSIS — M21612 Bunion of left foot: Secondary | ICD-10-CM | POA: Diagnosis not present

## 2016-12-08 DIAGNOSIS — M2042 Other hammer toe(s) (acquired), left foot: Secondary | ICD-10-CM | POA: Diagnosis not present

## 2016-12-08 DIAGNOSIS — M2012 Hallux valgus (acquired), left foot: Secondary | ICD-10-CM | POA: Diagnosis not present

## 2016-12-11 NOTE — Progress Notes (Signed)
DOS 07.03.2018   1. [place] Bunionectomy (cutting and moving bone) with fixation left.   2. Fusion with pin 2nd toe left.

## 2016-12-16 ENCOUNTER — Ambulatory Visit (INDEPENDENT_AMBULATORY_CARE_PROVIDER_SITE_OTHER): Payer: BC Managed Care – PPO

## 2016-12-16 ENCOUNTER — Ambulatory Visit (INDEPENDENT_AMBULATORY_CARE_PROVIDER_SITE_OTHER): Payer: BC Managed Care – PPO | Admitting: Podiatry

## 2016-12-16 ENCOUNTER — Encounter: Payer: Self-pay | Admitting: Podiatry

## 2016-12-16 VITALS — BP 138/67 | HR 60 | Temp 97.3°F | Resp 16

## 2016-12-16 DIAGNOSIS — M2012 Hallux valgus (acquired), left foot: Secondary | ICD-10-CM

## 2016-12-16 DIAGNOSIS — M2040 Other hammer toe(s) (acquired), unspecified foot: Secondary | ICD-10-CM

## 2016-12-16 NOTE — Progress Notes (Signed)
Subjective:    Patient ID: Catherine Hoffman, female   DOB: 76 y.o.   MRN: [id number]   HPI patient states she's doing great with her left foot with minimal discomfort swelling and pain    ROS      Objective:  Physical Exam neurovascular status intact negative Homans sign noted with well healed surgical site left first metatarsal with good alignment and second digit with pin in place with slight elevation of the toe but good structural alignment     Assessment:    Doing well post foot surgery left     Plan:   H&P x-rays reviewed with patient and at this point I advised on holding the toe down and a lowered D rotated position and to continue with elevation compression immobilization. Reappoint 2 weeks for suture removal and x-ray or earlier if needed  X-rays indicate osteotomy is healing well with pins in place no indication of movement and second toe in good alignment

## 2016-12-30 ENCOUNTER — Encounter: Payer: Self-pay | Admitting: Podiatry

## 2016-12-30 ENCOUNTER — Ambulatory Visit (INDEPENDENT_AMBULATORY_CARE_PROVIDER_SITE_OTHER): Payer: BC Managed Care – PPO

## 2016-12-30 ENCOUNTER — Ambulatory Visit (INDEPENDENT_AMBULATORY_CARE_PROVIDER_SITE_OTHER): Payer: BC Managed Care – PPO | Admitting: Podiatry

## 2016-12-30 VITALS — BP 114/71 | HR 59 | Resp 16

## 2016-12-30 DIAGNOSIS — M2042 Other hammer toe(s) (acquired), left foot: Secondary | ICD-10-CM | POA: Diagnosis not present

## 2016-12-30 DIAGNOSIS — M2012 Hallux valgus (acquired), left foot: Secondary | ICD-10-CM

## 2016-12-30 MED ORDER — HYDROCODONE-ACETAMINOPHEN 10-325 MG PO TABS: 1.0000 | ORAL_TABLET | Freq: Three times a day (TID) | ORAL | 0 refills | Status: DC | PRN

## 2016-12-30 NOTE — Progress Notes (Signed)
Subjective:    Patient ID: Catherine EvertsLuebelle Hoffman, female   DOB: 76 y.o.   MRN: [id number]   HPI patient states she's doing well with her left foot stating that overall she's having minimal discomfort and she has been active    ROS      Objective:  Physical Exam neurovascular status intact negative Homan sign was noted with pin intact second digit left with mild elevation of the digit and left first MPJ showing minimal edema with good range of motion and no crepitus     Assessment:    Healing well with patient is been a little bit over active with moderate lift of the second digit left     Plan:    H&P x-rays reviewed and recommended continued immobilization with slight movement of the first metatarsal segment left. At this point we did dispense a above ankle brace to lower the second toe and provide for compression also in the foot and ankle. Reappoint to recheck  X-ray indicated there is a slight irritation of the left dorsal portion of the metatarsal where the osteotomy was done but it is localized and should heal uneventfully but we will continue to keep her immobilized as precautionary measure

## 2017-01-04 ENCOUNTER — Other Ambulatory Visit: Payer: BC Managed Care – PPO

## 2017-01-14 ENCOUNTER — Ambulatory Visit (INDEPENDENT_AMBULATORY_CARE_PROVIDER_SITE_OTHER): Payer: BC Managed Care – PPO

## 2017-01-14 ENCOUNTER — Ambulatory Visit (INDEPENDENT_AMBULATORY_CARE_PROVIDER_SITE_OTHER): Payer: BC Managed Care – PPO | Admitting: Podiatry

## 2017-01-14 DIAGNOSIS — M2012 Hallux valgus (acquired), left foot: Secondary | ICD-10-CM

## 2017-01-14 DIAGNOSIS — M2040 Other hammer toe(s) (acquired), unspecified foot: Secondary | ICD-10-CM

## 2017-01-14 DIAGNOSIS — M2042 Other hammer toe(s) (acquired), left foot: Secondary | ICD-10-CM | POA: Diagnosis not present

## 2017-01-14 NOTE — Progress Notes (Signed)
Subjective:    Patient ID: Catherine Hoffman, female   DOB: 76 y.o.   MRN: [id number]   HPI patient states doing very well with foot with minimal discomfort pin in place and good alignment    ROS      Objective:  Physical Exam neurovascular status intact with slight elevation of the second toe left but not pathological with pin in place second toe and good range of motion first MPJ with negative Homans sign     Assessment:    Doing well post forefoot reconstruction left     Plan:    Pin removed second digit left sterile dressing applied and instructed on continued plantarflexion of the toe dispensed ankle compression stocking continue moderate immobilization elevation and reappoint 4 weeks or earlier if needed  X-rays indicate the osteotomy and the second toe are healing well with alignment good and mild elevation of the second toe which hopefully will come down over time

## 2017-02-03 ENCOUNTER — Other Ambulatory Visit: Payer: Self-pay | Admitting: Internal Medicine

## 2017-02-09 ENCOUNTER — Other Ambulatory Visit: Payer: Self-pay | Admitting: Internal Medicine

## 2017-02-11 ENCOUNTER — Other Ambulatory Visit: Payer: BC Managed Care – PPO | Admitting: Podiatry

## 2017-02-26 ENCOUNTER — Encounter: Payer: Self-pay | Admitting: Podiatry

## 2017-02-26 ENCOUNTER — Ambulatory Visit (INDEPENDENT_AMBULATORY_CARE_PROVIDER_SITE_OTHER): Payer: BC Managed Care – PPO | Admitting: Podiatry

## 2017-02-26 ENCOUNTER — Ambulatory Visit (INDEPENDENT_AMBULATORY_CARE_PROVIDER_SITE_OTHER): Payer: BC Managed Care – PPO

## 2017-02-26 VITALS — BP 135/77 | HR 70 | Resp 16

## 2017-02-26 DIAGNOSIS — M2042 Other hammer toe(s) (acquired), left foot: Secondary | ICD-10-CM

## 2017-02-26 DIAGNOSIS — M2012 Hallux valgus (acquired), left foot: Secondary | ICD-10-CM

## 2017-02-26 NOTE — Progress Notes (Signed)
Subjective:    Patient ID: Catherine Hoffman, female   DOB: 76 y.o.   MRN: [id number]   HPI patient states she's feeling real good with occasional pain on the incision site left but is wearing normal shoe gear currently    ROS      Objective:  Physical Exam neurovascular status intact negative Homans sign noted with well coapted incision site left first MPJ with wound edges well coapted digits and good alignment was slight elevation of the second toe with good range of motion and no crepitus of the first MPJ     Assessment:   Doing well overall with slight movement of the first MPJ but it appears to be healing well with good range of motion and no crepitus     Plan:    H&P condition reviewed recommended continuation of range of motion activities and continuation of elevation compression. Patient will gradually increase activity levels will be seen back 6 weeks or earlier if needed  X-rays indicate the osteotomy is healing well with secondary bone healing dorsal portion but stable with no indication of further movement with digits and good alignment

## 2017-03-04 ENCOUNTER — Other Ambulatory Visit: Payer: Self-pay | Admitting: Internal Medicine

## 2017-03-09 ENCOUNTER — Ambulatory Visit (INDEPENDENT_AMBULATORY_CARE_PROVIDER_SITE_OTHER): Payer: BC Managed Care – PPO | Admitting: Internal Medicine

## 2017-03-09 ENCOUNTER — Encounter: Payer: Self-pay | Admitting: Internal Medicine

## 2017-03-09 VITALS — BP 128/82 | HR 60 | Temp 97.7°F | Ht 62.0 in | Wt 187.0 lb

## 2017-03-09 DIAGNOSIS — Z23 Encounter for immunization: Secondary | ICD-10-CM | POA: Diagnosis not present

## 2017-03-09 DIAGNOSIS — I10 Essential (primary) hypertension: Secondary | ICD-10-CM

## 2017-03-09 DIAGNOSIS — I872 Venous insufficiency (chronic) (peripheral): Secondary | ICD-10-CM

## 2017-03-09 NOTE — Patient Instructions (Signed)
We have given you the flu shot today.   Work on using the compression stockings during the day.

## 2017-03-09 NOTE — Progress Notes (Signed)
   Subjective:    Patient ID: Catherine Hoffman, female    DOB: [birth year], 76 y.o.   MRN: [id number]  HPI The patient is a 76 YO female coming in for follow up of her venous insufficiency and blood pressure. She has had a surgery on her left foot since last visit. This has helped with the pain. She was having more swelling after the surgery. She is using the compression stocking but only at night time. She does not like people to see her using it during the day. No change to her medicines and she is not taking pain medicine anymore.   Review of Systems  Constitutional: Negative.   HENT: Negative.   Eyes: Negative.   Respiratory: Negative for cough, chest tightness and shortness of breath.   Cardiovascular: Positive for leg swelling. Negative for chest pain and palpitations.  Gastrointestinal: Negative for abdominal distention, abdominal pain, constipation, diarrhea, nausea and vomiting.  Musculoskeletal: Negative.   Skin: Negative.   Psychiatric/Behavioral: Negative.       Objective:   Physical Exam  Constitutional: She is oriented to person, place, and time. She appears well-developed and well-nourished.  HENT:  Head: Normocephalic and atraumatic.  Eyes: EOM are normal.  Neck: Normal range of motion.  Cardiovascular: Normal rate and regular rhythm.   Pulmonary/Chest: Effort normal and breath sounds normal. No respiratory distress. She has no wheezes. She has no rales.  Abdominal: Soft. Bowel sounds are normal. She exhibits no distension. There is no tenderness. There is no rebound.  Musculoskeletal: She exhibits no edema.  Neurological: She is alert and oriented to person, place, and time. Coordination normal.  Skin: Skin is warm and dry.  Psychiatric: She has a normal mood and affect.   Vitals:   03/09/17 0926  BP: 128/82  Pulse: 60  Temp: 97.7 F (36.5 C)  TempSrc: Oral  SpO2: 100%  Weight: 187 lb (84.8 kg)  Height:  (1.575 m)      Assessment & Plan:  Flu shot  given at visit.

## 2017-03-09 NOTE — Assessment & Plan Note (Signed)
BP at goal on spironolactone and labs up to date. Refill as needed.

## 2017-03-09 NOTE — Assessment & Plan Note (Signed)
Stable symptoms, slightly worse after the surgery. Encouraged her to use the compression stocking during the day as well.

## 2017-03-23 ENCOUNTER — Other Ambulatory Visit: Payer: Self-pay | Admitting: Internal Medicine

## 2017-03-25 ENCOUNTER — Other Ambulatory Visit: Payer: Self-pay | Admitting: Internal Medicine

## 2017-04-09 ENCOUNTER — Ambulatory Visit (INDEPENDENT_AMBULATORY_CARE_PROVIDER_SITE_OTHER): Payer: BC Managed Care – PPO | Admitting: Podiatry

## 2017-04-09 ENCOUNTER — Ambulatory Visit (INDEPENDENT_AMBULATORY_CARE_PROVIDER_SITE_OTHER): Payer: BC Managed Care – PPO

## 2017-04-09 DIAGNOSIS — M2042 Other hammer toe(s) (acquired), left foot: Secondary | ICD-10-CM | POA: Diagnosis not present

## 2017-04-09 DIAGNOSIS — M2012 Hallux valgus (acquired), left foot: Secondary | ICD-10-CM | POA: Diagnosis not present

## 2017-04-13 NOTE — Progress Notes (Signed)
Subjective:    Patient ID: Catherine Hoffman, female   DOB: 76 y.o.   MRN: [id number]   HPI patient states she's doing fine with her foot but if she's on her foot all day she gets a little bit of swelling that was concerned    ROS      Objective:  Physical Exam neurovascular status intact with patient's left foot doing well with wound edges well coapted good range of motion with no crepitus of the joint currently     Assessment:   Doing well with surgery with mild movement around the first metatarsal head left but it appears stable      Plan:    X-ray reviewed and discussed gradual increase in activities and that the swelling should continue to reduce but may take up to 6 months. Patient will be seen back in the next several months  X-rays indicate there is been some secondary bone healing but it appears stable with no pathology with the joint congruence

## 2017-05-27 ENCOUNTER — Other Ambulatory Visit: Payer: Self-pay | Admitting: Internal Medicine

## 2017-08-11 NOTE — Progress Notes (Signed)
Subjective:   Catherine Hoffman is a 77 y.o. female who presents for an Initial Medicare Annual Wellness Visit.  Review of Systems    No ROS.  Medicare Wellness Visit. Additional risk factors are reflected in the social history.   Cardiac Risk Factors include: advanced age (>1755men, 69>65 women);dyslipidemia;hypertension;obesity (BMI >30kg/m2) Sleep patterns: gets up 1-2 times nightly to void and sleeps 6-7 hours nightly.   Home Safety/Smoke Alarms: Feels safe in home. Smoke alarms in place.  Living environment; residence and Firearm Safety: 1-story house/ trailer, no firearms. Lives alone, no needs for DME, good support system Seat Belt Safety/Bike Helmet: Wears seat belt.     Objective:    There were no vitals filed for this visit. There is no height or weight on file to calculate BMI.  Advanced Directives 08/12/2017 03/07/2016 07/14/2015 08/30/2012  Does Patient Have a Medical Advance Directive? No No No Patient does not have advance directive;Patient would like information  Would patient like information on creating a medical advance directive? Yes (ED - Information included in AVS) - - Advance directive packet given  Pre-existing out of facility DNR order (yellow form or pink MOST form) - - - No    Current Medications (verified) Outpatient Encounter Medications as of 08/12/2017  Medication Sig  . aspirin 81 MG tablet Take 81 mg by mouth daily.    . calcium-vitamin D (OSCAL WITH D) 500-200 MG-UNIT per tablet Take 1 tablet by mouth daily with breakfast.   . cyclobenzaprine (FLEXERIL) 5 MG tablet Take 1 tablet (5 mg total) by mouth 3 (three) times daily as needed for muscle spasms.  Marland Kitchen. estradiol (ESTRACE) 0.5 MG tablet TK 1 T PO QD  . fluticasone (FLONASE) 50 MCG/ACT nasal spray SHAKE LIQUID AND USE 2 SPRAYS IN EACH NOSTRIL DAILY  . latanoprost (XALATAN) 0.005 % ophthalmic solution Place 1 drop into both eyes at bedtime.  Marland Kitchen. omeprazole (PRILOSEC) 40 MG capsule TAKE ONE CAPSULE BY MOUTH  TWICE DAILY 30 MINUTES BEFORE FIRST AND LAST MEAL OF THE DAY  . ondansetron (ZOFRAN) 4 MG tablet Take 4 mg by mouth every 8 (eight) hours as needed for nausea or vomiting (Take 1 tablet by mouth every eight hours as needed for nausea).  . potassium chloride SA (K-DUR,KLOR-CON) 20 MEQ tablet TAKE 1 TABLET(20 MEQ) BY MOUTH DAILY... NEED OFFICE VISIT FOR REFILLS  . spironolactone (ALDACTONE) 25 MG tablet Take 1 tablet (25 mg total) by mouth 2 (two) times daily. Keep 03/09/2017 appointment for further refills  . [DISCONTINUED] meloxicam (MOBIC) 7.5 MG tablet TAKE 1 TABLET(7.5 MG) BY MOUTH DAILY   No facility-administered encounter medications on file as of 08/12/2017.     Allergies (verified) Hydrocodone-acetaminophen and Tramadol   History: Past Medical History:  Diagnosis Date  . Arthritis   . Episodic tension type headache   . Esophageal reflux   . GERD (gastroesophageal reflux disease)   . Hyperlipidemia   . Hypertension   . Internal hemorrhoids   . Kidney stone on left side Hx-date unknown  . Left rotator cuff tear    Past Surgical History:  Procedure Laterality Date  . COLONOSCOPY  11/20/2008  . KNEE ARTHROSCOPY Left 2004  . SHOULDER OPEN ROTATOR CUFF REPAIR Left 08/30/2012   Procedure: ROTATOR CUFF REPAIR SHOULDER OPEN, POSSIBLE GRAFT AND ANCHORS;  Surgeon: Jacki Conesonald A Gioffre, MD;  [facility];  Service: Orthopedics;  Laterality: Left;  . TOTAL ABDOMINAL HYSTERECTOMY W/ BILATERAL SALPINGOOPHORECTOMY  1990's   Family History  Problem  Relation Age of Onset  . Hypertension Mother   . Diabetes Mother   . Cancer Mother        in leg  . Varicose Veins Maternal Uncle   . Colon cancer Neg Hx    Social History   Socioeconomic History  . Marital status: Widowed    Spouse name: Not on file  . Number of children: 2  . Years of education: Not on file  . Highest education level: Not on file  Social Needs  . Financial resource strain: Not on file  . Food  insecurity - worry: Not on file  . Food insecurity - inability: Not on file  . Transportation needs - medical: Not on file  . Transportation needs - non-medical: Not on file  Occupational History  . Occupation: Lexicographer  Tobacco Use  . Smoking status: Never Smoker  . Smokeless tobacco: Never Used  Substance and Sexual Activity  . Alcohol use: No  . Drug use: No  . Sexual activity: Not on file  Other Topics Concern  . Not on file  Social History Narrative  . Not on file    Tobacco Counseling Counseling given: Not Answered  Activities of Daily Living In your present state of health, do you have any difficulty performing the following activities: 08/12/2017  Hearing? N  Vision? N  Difficulty concentrating or making decisions? N  Walking or climbing stairs? N  Dressing or bathing? N  Doing errands, shopping? N  Preparing Food and eating ? N  Using the Toilet? N  In the past six months, have you accidently leaked urine? N  Do you have problems with loss of bowel control? N  Managing your Medications? N  Managing your Finances? N  Housekeeping or managing your Housekeeping? N  Some recent data might be hidden     Immunizations and Health Maintenance Immunization History  Administered Date(s) Administered  . Influenza Split 05/30/2012  . Influenza Whole 07/08/2009, 02/26/2010, 03/09/2011  . Influenza, High Dose Seasonal PF 03/09/2017  . Influenza,inj,Quad PF,6+ Mos 02/07/2013, 08/17/2014, 02/27/2015, 01/30/2016  . Pneumococcal Conjugate-13 08/17/2014  . Pneumococcal Polysaccharide-23 07/26/2015  . Td 02/25/2009  . Tdap 05/25/2016   Health Maintenance Due  Topic Date Due  . DEXA SCAN  01/30/2006    Patient Care Team: Myrlene Broker, MD as PCP - General (Internal Medicine)  Indicate any recent Medical Services you may have received from other than [facility] providers in the past year (date may be approximate).     Assessment:   This is a routine wellness  examination for Catherine Hoffman. Physical assessment deferred to PCP.   Hearing/Vision screen Hearing Screening Comments: Able to hear conversational tones w/o difficulty. No issues reported.  Passed whisper test Vision Screening Comments: appointment yearly Dr. 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 Discussed weight loss strategies. Diet education was provided via handout.      Goals    . Patient Stated     I want to join a fitness center that has water aerobics, increase the amount of fluid I drink daily.  Depression Screen PHQ 2/9 Scores 08/12/2017 03/09/2017 06/06/2015  PHQ - 2 Score 0 0 0  PHQ- 9 Score 2 - -    Fall Risk Fall Risk  08/12/2017 03/09/2017 06/06/2015  Falls in the past year? No No No   Cognitive Function: MMSE - Mini Mental State Exam 08/12/2017  Orientation to time 5  Orientation to Place 5  Registration 3  Attention/ Calculation 4  Recall 2  Language- name 2 objects 2  Language- repeat 1  Language- follow 3 step command 3  Language- read & follow direction 1  Write a sentence 1  Copy design 1  Total score 28        Screening Tests Health Maintenance  Topic Date Due  . DEXA SCAN  01/30/2006  . TETANUS/TDAP  05/25/2026  . INFLUENZA VACCINE  Completed  . PNA vac Low Risk Adult  Completed     Plan:    Patient will call nurse to order a bone density scan this summer when the time will not interfere with her work schedule.   Continue doing brain stimulating activities (puzzles, reading, adult coloring books, staying  active) to keep memory sharp.   Continue to eat heart healthy diet (full of fruits, vegetables, whole grains, lean protein, water--limit salt, fat, and sugar intake) and increase physical activity as tolerated.   I have personally reviewed and noted the following in the patient's chart:   . Medical and social history . Use of alcohol, tobacco or illicit drugs  . Current medications and supplements . Functional ability and status . Nutritional status . Physical activity . Advanced directives . List of other physicians . Vitals . Screenings to include cognitive, depression, and falls . Referrals and appointments  In addition, I have reviewed and discussed with patient certain preventive protocols, quality metrics, and best practice recommendations. A written personalized care plan for preventive services as well as general preventive health recommendations were provided to patient.     Wanda Plump, RN   08/12/2017

## 2017-08-12 ENCOUNTER — Ambulatory Visit (INDEPENDENT_AMBULATORY_CARE_PROVIDER_SITE_OTHER): Payer: BC Managed Care – PPO | Admitting: *Deleted

## 2017-08-12 ENCOUNTER — Encounter: Payer: Self-pay | Admitting: Internal Medicine

## 2017-08-12 ENCOUNTER — Other Ambulatory Visit (INDEPENDENT_AMBULATORY_CARE_PROVIDER_SITE_OTHER): Payer: BC Managed Care – PPO

## 2017-08-12 ENCOUNTER — Ambulatory Visit (INDEPENDENT_AMBULATORY_CARE_PROVIDER_SITE_OTHER): Payer: BC Managed Care – PPO | Admitting: Internal Medicine

## 2017-08-12 VITALS — BP 128/80 | HR 70 | Temp 97.8°F | Ht 62.0 in | Wt 193.1 lb

## 2017-08-12 DIAGNOSIS — Z0000 Encounter for general adult medical examination without abnormal findings: Secondary | ICD-10-CM | POA: Diagnosis not present

## 2017-08-12 DIAGNOSIS — I10 Essential (primary) hypertension: Secondary | ICD-10-CM

## 2017-08-12 DIAGNOSIS — R7301 Impaired fasting glucose: Secondary | ICD-10-CM

## 2017-08-12 DIAGNOSIS — M545 Low back pain: Secondary | ICD-10-CM | POA: Diagnosis not present

## 2017-08-12 DIAGNOSIS — K219 Gastro-esophageal reflux disease without esophagitis: Secondary | ICD-10-CM

## 2017-08-12 DIAGNOSIS — G8929 Other chronic pain: Secondary | ICD-10-CM | POA: Diagnosis not present

## 2017-08-12 DIAGNOSIS — E785 Hyperlipidemia, unspecified: Secondary | ICD-10-CM

## 2017-08-12 LAB — CBC
HEMATOCRIT: 40.4 % (ref 36.0–46.0)
HEMOGLOBIN: 13.3 g/dL (ref 12.0–15.0)
MCHC: 32.9 g/dL (ref 30.0–36.0)
MCV: 91.4 fl (ref 78.0–100.0)
Platelets: 190 10*3/uL (ref 150.0–400.0)
RBC: 4.42 Mil/uL (ref 3.87–5.11)
RDW: 14 % (ref 11.5–15.5)
WBC: 5.9 10*3/uL (ref 4.0–10.5)

## 2017-08-12 LAB — COMPREHENSIVE METABOLIC PANEL
ALBUMIN: 4.1 g/dL (ref 3.5–5.2)
ALT: 9 U/L (ref 0–35)
AST: 14 U/L (ref 0–37)
Alkaline Phosphatase: 91 U/L (ref 39–117)
BILIRUBIN TOTAL: 0.6 mg/dL (ref 0.2–1.2)
BUN: 16 mg/dL (ref 6–23)
CO2: 25 mEq/L (ref 19–32)
Calcium: 9.8 mg/dL (ref 8.4–10.5)
Chloride: 108 mEq/L (ref 96–112)
Creatinine, Ser: 0.93 mg/dL (ref 0.40–1.20)
GFR: 75.28 mL/min (ref >60.00)
Glucose, Bld: 98 mg/dL (ref 70–99)
POTASSIUM: 4.1 meq/L (ref 3.5–5.1)
Sodium: 141 mEq/L (ref 135–145)
TOTAL PROTEIN: 7.1 g/dL (ref 6.0–8.3)

## 2017-08-12 LAB — LIPID PANEL
CHOLESTEROL: 202 mg/dL — AB (ref 0–200)
HDL: 51.4 mg/dL (ref >39.00)
LDL Cholesterol: 137 mg/dL — ABNORMAL HIGH (ref 0–99)
NONHDL: 150.46
Total CHOL/HDL Ratio: 4
Triglycerides: 66 mg/dL (ref 0.0–149.0)
VLDL: 13.2 mg/dL (ref 0.0–40.0)

## 2017-08-12 LAB — HEMOGLOBIN A1C: HEMOGLOBIN A1C: 5.8 % (ref 4.6–6.5)

## 2017-08-12 MED ORDER — MELOXICAM 7.5 MG PO TABS: ORAL_TABLET | ORAL | 3 refills | Status: DC

## 2017-08-12 NOTE — Patient Instructions (Addendum)
We have done the EKG which is normal and the same from before.   We are checking the labs today.   Health Maintenance, Female Adopting a healthy lifestyle and getting preventive care can go a long way to promote health and wellness. Talk with your health care provider about what schedule of regular examinations is right for you. This is a good chance for you to check in with your provider about disease prevention and staying healthy. In between checkups, there are plenty of things you can do on your own. Experts have done a lot of research about which lifestyle changes and preventive measures are most likely to keep you healthy. Ask your health care provider for more information. Weight and diet Eat a healthy diet  Be sure to include plenty of vegetables, fruits, low-fat dairy products, and lean protein.  Do not eat a lot of foods high in solid fats, added sugars, or salt.  Get regular exercise. This is one of the most important things you can do for your health. ? Most adults should exercise for at least 150 minutes each week. The exercise should increase your heart rate and make you sweat (moderate-intensity exercise). ? Most adults should also do strengthening exercises at least twice a week. This is in addition to the moderate-intensity exercise.  Maintain a healthy weight  Body mass index (BMI) is a measurement that can be used to identify possible weight problems. It estimates body fat based on height and weight. Your health care provider can help determine your BMI and help you achieve or maintain a healthy weight.  For females 67 years of age and older: ? A BMI below 18.5 is considered underweight. ? A BMI of 18.5 to 24.9 is normal. ? A BMI of 25 to 29.9 is considered overweight. ? A BMI of 30 and above is considered obese.  Watch levels of cholesterol and blood lipids  You should start having your blood tested for lipids and cholesterol at 77 years of age, then have this test  every 5 years.  You may need to have your cholesterol levels checked more often if: ? Your lipid or cholesterol levels are high. ? You are older than 77 years of age. ? You are at high risk for heart disease.  Cancer screening Lung Cancer  Lung cancer screening is recommended for adults 62-67 years old who are at high risk for lung cancer because of a history of smoking.  A yearly low-dose CT scan of the lungs is recommended for people who: ? Currently smoke. ? Have quit within the past 15 years. ? Have at least a 30-pack-year history of smoking. A pack year is smoking an average of one pack of cigarettes a day for 1 year.  Yearly screening should continue until it has been 15 years since you quit.  Yearly screening should stop if you develop a health problem that would prevent you from having lung cancer treatment.  Breast Cancer  Practice breast self-awareness. This means understanding how your breasts normally appear and feel.  It also means doing regular breast self-exams. Let your health care provider know about any changes, no matter how small.  If you are in your 20s or 30s, you should have a clinical breast exam (CBE) by a health care provider every 1-3 years as part of a regular health exam.  If you are 42 or older, have a CBE every year. Also consider having a breast X-ray (mammogram) every year.  If you  have a family history of breast cancer, talk to your health care provider about genetic screening.  If you are at high risk for breast cancer, talk to your health care provider about having an MRI and a mammogram every year.  Breast cancer gene (BRCA) assessment is recommended for women who have family members with BRCA-related cancers. BRCA-related cancers include: ? Breast. ? Ovarian. ? Tubal. ? Peritoneal cancers.  Results of the assessment will determine the need for genetic counseling and BRCA1 and BRCA2 testing.  Cervical Cancer Your health care provider  may recommend that you be screened regularly for cancer of the pelvic organs (ovaries, uterus, and vagina). This screening involves a pelvic examination, including checking for microscopic changes to the surface of your cervix (Pap test). You may be encouraged to have this screening done every 3 years, beginning at age 21.  For women ages 30-65, health care providers may recommend pelvic exams and Pap testing every 3 years, or they may recommend the Pap and pelvic exam, combined with testing for human papilloma virus (HPV), every 5 years. Some types of HPV increase your risk of cervical cancer. Testing for HPV may also be done on women of any age with unclear Pap test results.  Other health care providers may not recommend any screening for nonpregnant women who are considered low risk for pelvic cancer and who do not have symptoms. Ask your health care provider if a screening pelvic exam is right for you.  If you have had past treatment for cervical cancer or a condition that could lead to cancer, you need Pap tests and screening for cancer for at least 20 years after your treatment. If Pap tests have been discontinued, your risk factors (such as having a new sexual partner) need to be reassessed to determine if screening should resume. Some women have medical problems that increase the chance of getting cervical cancer. In these cases, your health care provider may recommend more frequent screening and Pap tests.  Colorectal Cancer  This type of cancer can be detected and often prevented.  Routine colorectal cancer screening usually begins at 77 years of age and continues through 77 years of age.  Your health care provider may recommend screening at an earlier age if you have risk factors for colon cancer.  Your health care provider may also recommend using home test kits to check for hidden blood in the stool.  A small camera at the end of a tube can be used to examine your colon directly  (sigmoidoscopy or colonoscopy). This is done to check for the earliest forms of colorectal cancer.  Routine screening usually begins at age 50.  Direct examination of the colon should be repeated every 5-10 years through 77 years of age. However, you may need to be screened more often if early forms of precancerous polyps or small growths are found.  Skin Cancer  Check your skin from head to toe regularly.  Tell your health care provider about any new moles or changes in moles, especially if there is a change in a mole's shape or color.  Also tell your health care provider if you have a mole that is larger than the size of a pencil eraser.  Always use sunscreen. Apply sunscreen liberally and repeatedly throughout the day.  Protect yourself by wearing long sleeves, pants, a wide-brimmed hat, and sunglasses whenever you are outside.  Heart disease, diabetes, and high blood pressure  High blood pressure causes heart disease and increases the   risk of stroke. High blood pressure is more likely to develop in: ? People who have blood pressure in the high end of the normal range (130-139/85-89 mm Hg). ? People who are overweight or obese. ? People who are African American.  If you are 42-66 years of age, have your blood pressure checked every 3-5 years. If you are 57 years of age or older, have your blood pressure checked every year. You should have your blood pressure measured twice-once when you are at a hospital or clinic, and once when you are not at a hospital or clinic. Record the average of the two measurements. To check your blood pressure when you are not at a hospital or clinic, you can use: ? An automated blood pressure machine at a pharmacy. ? A home blood pressure monitor.  If you are between 12 years and 35 years old, ask your health care provider if you should take aspirin to prevent strokes.  Have regular diabetes screenings. This involves taking a blood sample to check your  fasting blood sugar level. ? If you are at a normal weight and have a low risk for diabetes, have this test once every three years after 77 years of age. ? If you are overweight and have a high risk for diabetes, consider being tested at a younger age or more often. Preventing infection Hepatitis B  If you have a higher risk for hepatitis B, you should be screened for this virus. You are considered at high risk for hepatitis B if: ? You were born in a country where hepatitis B is common. Ask your health care provider which countries are considered high risk. ? Your parents were born in a high-risk country, and you have not been immunized against hepatitis B (hepatitis B vaccine). ? You have HIV or AIDS. ? You use needles to inject street drugs. ? You live with someone who has hepatitis B. ? You have had sex with someone who has hepatitis B. ? You get hemodialysis treatment. ? You take certain medicines for conditions, including cancer, organ transplantation, and autoimmune conditions.  Hepatitis C  Blood testing is recommended for: ? Everyone born from [birth year] through 1965. ? Anyone with known risk factors for hepatitis C.  Sexually transmitted infections (STIs)  You should be screened for sexually transmitted infections (STIs) including gonorrhea and chlamydia if: ? You are sexually active and are younger than 77 years of age. ? You are older than 77 years of age and your health care provider tells you that you are at risk for this type of infection. ? Your sexual activity has changed since you were last screened and you are at an increased risk for chlamydia or gonorrhea. Ask your health care provider if you are at risk.  If you do not have HIV, but are at risk, it may be recommended that you take a prescription medicine daily to prevent HIV infection. This is called pre-exposure prophylaxis (PrEP). You are considered at risk if: ? You are sexually active and do not regularly use condoms  or know the HIV status of your partner(s). ? You take drugs by injection. ? You are sexually active with a partner who has HIV.  Talk with your health care provider about whether you are at high risk of being infected with HIV. If you choose to begin PrEP, you should first be tested for HIV. You should then be tested every 3 months for as long as you are taking PrEP. Pregnancy  If you are premenopausal and you may become pregnant, ask your health care provider about preconception counseling.  If you may become pregnant, take 400 to 800 micrograms (mcg) of folic acid every day.  If you want to prevent pregnancy, talk to your health care provider about birth control (contraception). Osteoporosis and menopause  Osteoporosis is a disease in which the bones lose minerals and strength with aging. This can result in serious bone fractures. Your risk for osteoporosis can be identified using a bone density scan.  If you are 1 years of age or older, or if you are at risk for osteoporosis and fractures, ask your health care provider if you should be screened.  Ask your health care provider whether you should take a calcium or vitamin D supplement to lower your risk for osteoporosis.  Menopause may have certain physical symptoms and risks.  Hormone replacement therapy may reduce some of these symptoms and risks. Talk to your health care provider about whether hormone replacement therapy is right for you. Follow these instructions at home:  Schedule regular health, dental, and eye exams.  Stay current with your immunizations.  Do not use any tobacco products including cigarettes, chewing tobacco, or electronic cigarettes.  If you are pregnant, do not drink alcohol.  If you are breastfeeding, limit how much and how often you drink alcohol.  Limit alcohol intake to no more than 1 drink per day for nonpregnant women. One drink equals 12 ounces of beer, 5 ounces of wine, or 1 ounces of hard  liquor.  Do not use street drugs.  Do not share needles.  Ask your health care provider for help if you need support or information about quitting drugs.  Tell your health care provider if you often feel depressed.  Tell your health care provider if you have ever been abused or do not feel safe at home. This information is not intended to replace advice given to you by your health care provider. Make sure you discuss any questions you have with your health care provider. Document Released: 12/08/2010 Document Revised: 10/31/2015 Document Reviewed: 02/26/2015 Elsevier Interactive Patient Education  [employer].

## 2017-08-12 NOTE — Progress Notes (Signed)
Medical screening examination/treatment/procedure(s) were performed by non-physician practitioner and as supervising physician I was immediately available for consultation/collaboration. I agree with above. Blanche Gallien A Rollen Selders, MD 

## 2017-08-12 NOTE — Progress Notes (Signed)
   Subjective:    Patient ID: Catherine Hoffman, female    DOB: [birth year], 77 y.o.   MRN: [id number]  HPI The patient is a 77 YO female coming in for physical.   [facility], social history reviewed and updated.  Review of Systems  Constitutional: Negative.   HENT: Negative.   Eyes: Negative.   Respiratory: Negative for cough, chest tightness and shortness of breath.   Cardiovascular: Negative for chest pain, palpitations and leg swelling.  Gastrointestinal: Negative for abdominal distention, abdominal pain, constipation, diarrhea, nausea and vomiting.  Musculoskeletal: Negative.   Skin: Negative.   Neurological: Negative.   Psychiatric/Behavioral: Negative.       Objective:   Physical Exam  Constitutional: She is oriented to person, place, and time. She appears well-developed and well-nourished.  HENT:  Head: Normocephalic and atraumatic.  Eyes: EOM are normal.  Neck: Normal range of motion.  Cardiovascular: Normal rate and regular rhythm.  Pulmonary/Chest: Effort normal and breath sounds normal. No respiratory distress. She has no wheezes. She has no rales.  Abdominal: Soft. Bowel sounds are normal. She exhibits no distension. There is no tenderness. There is no rebound.  Musculoskeletal: She exhibits no edema.  Neurological: She is alert and oriented to person, place, and time. Coordination normal.  Skin: Skin is warm and dry.  Psychiatric: She has a normal mood and affect.   Vitals:   08/12/17 0931  BP: 128/80  Pulse: 70  Temp: 97.8 F (36.6 C)  TempSrc: Oral  SpO2: 98%  Weight: 193 lb 1.3 oz (87.6 kg)  Height: 5\' 2"  (1.575 m)   EKG: Rate 57, axis fine, intervals normal, sinus, no change from prior 2017 in st or t waves, some poor r wave progression which is not new    Assessment & Plan:

## 2017-08-12 NOTE — Patient Instructions (Signed)
Continue doing brain stimulating activities (puzzles, reading, adult coloring books, staying active) to keep memory sharp.   Continue to eat heart healthy diet (full of fruits, vegetables, whole grains, lean protein, water--limit salt, fat, and sugar intake) and increase physical activity as tolerated.   Ms. Catherine Hoffman , Thank you for taking time to come for your Medicare Wellness Visit. I appreciate your ongoing commitment to your health goals. Please review the following plan we discussed and let me know if I can assist you in the future.   These are the goals we discussed: Goals    . Patient Stated     I want to join a fitness center that has water aerobics, increase the amount of fluid I drink daily.        This is a list of the screening recommended for you and due dates:  Health Maintenance  Topic Date Due  . DEXA scan (bone density measurement)  01/30/2006  . Tetanus Vaccine  05/25/2026  . Flu Shot  Completed  . Pneumonia vaccines  Completed

## 2017-08-14 NOTE — Assessment & Plan Note (Signed)
Flu, pneumonia and tetanus up to date. Declines shingles today. Colonoscopy aged out. Mammogram up to date. Dexa denies. Counseled about sun safety and mole surveillance. Given 10 year screening recommendations.

## 2017-08-14 NOTE — Assessment & Plan Note (Signed)
Rx for meloxicam which helped with her pain.

## 2017-08-14 NOTE — Assessment & Plan Note (Signed)
Taking prilosec daily for symptoms and gets problems with trying to stop.

## 2017-08-14 NOTE — Assessment & Plan Note (Signed)
Taking spironolactone and checking CMP. EKG done without changes today from prior. BP at goal. Adjust as needed.

## 2017-08-14 NOTE — Assessment & Plan Note (Signed)
Checking lipid panel and adjust as needed. Currently diet controlled.  

## 2017-09-01 ENCOUNTER — Other Ambulatory Visit: Payer: Self-pay | Admitting: Internal Medicine

## 2017-09-28 DIAGNOSIS — Z961 Presence of intraocular lens: Secondary | ICD-10-CM | POA: Diagnosis not present

## 2017-09-28 DIAGNOSIS — H401221 Low-tension glaucoma, left eye, mild stage: Secondary | ICD-10-CM | POA: Diagnosis not present

## 2017-09-28 DIAGNOSIS — H401213 Low-tension glaucoma, right eye, severe stage: Secondary | ICD-10-CM | POA: Diagnosis not present

## 2018-03-16 ENCOUNTER — Encounter: Payer: Self-pay | Admitting: Internal Medicine

## 2018-03-16 DIAGNOSIS — Z961 Presence of intraocular lens: Secondary | ICD-10-CM | POA: Diagnosis not present

## 2018-03-16 DIAGNOSIS — H401213 Low-tension glaucoma, right eye, severe stage: Secondary | ICD-10-CM | POA: Diagnosis not present

## 2018-03-16 DIAGNOSIS — H401221 Low-tension glaucoma, left eye, mild stage: Secondary | ICD-10-CM | POA: Diagnosis not present

## 2018-03-17 ENCOUNTER — Other Ambulatory Visit: Payer: Self-pay | Admitting: Internal Medicine

## 2018-03-17 ENCOUNTER — Ambulatory Visit (INDEPENDENT_AMBULATORY_CARE_PROVIDER_SITE_OTHER): Payer: BC Managed Care – PPO | Admitting: Internal Medicine

## 2018-03-17 ENCOUNTER — Encounter: Payer: Self-pay | Admitting: Internal Medicine

## 2018-03-17 VITALS — BP 138/88 | HR 65 | Temp 97.8°F | Ht 62.0 in | Wt 192.0 lb

## 2018-03-17 DIAGNOSIS — I10 Essential (primary) hypertension: Secondary | ICD-10-CM

## 2018-03-17 DIAGNOSIS — R21 Rash and other nonspecific skin eruption: Secondary | ICD-10-CM | POA: Diagnosis not present

## 2018-03-17 DIAGNOSIS — M170 Bilateral primary osteoarthritis of knee: Secondary | ICD-10-CM

## 2018-03-17 DIAGNOSIS — Z23 Encounter for immunization: Secondary | ICD-10-CM

## 2018-03-17 MED ORDER — TRIAMCINOLONE ACETONIDE 0.1 % EX CREA: 1.0000 "application " | TOPICAL_CREAM | Freq: Two times a day (BID) | CUTANEOUS | 0 refills | Status: DC

## 2018-03-17 MED ORDER — DICLOFENAC SODIUM 1 % TD GEL: 2.0000 g | Freq: Four times a day (QID) | TRANSDERMAL | 6 refills | Status: DC

## 2018-03-17 NOTE — Progress Notes (Signed)
   Subjective:    Patient ID: Catherine Hoffman, female    DOB: 07/18/1940, 77 y.o.   MRN: [id number]  HPI The patient is a 77 YO female coming in for rash (started in the last several weeks, she thinks she was outside and may have run into something, denies bites, tried otc and this did not help, itching some, denies pain, denies drainage) and arthritis (needs to see if there is anything topical that can help, using tylenol and this works but wants to try topical, denies falls or injury recently, stable with time but gradually worsening with years), and her blood pressure (taking spironolactone and BP at goal, denies headaches or chest pains, denies side effects).   Review of Systems  Constitutional: Negative.   HENT: Negative.   Eyes: Negative.   Respiratory: Negative for cough, chest tightness and shortness of breath.   Cardiovascular: Negative for chest pain, palpitations and leg swelling.  Gastrointestinal: Negative for abdominal distention, abdominal pain, constipation, diarrhea, nausea and vomiting.  Musculoskeletal: Positive for arthralgias.  Skin: Positive for rash.  Neurological: Negative.   Psychiatric/Behavioral: Negative.       Objective:   Physical Exam  Constitutional: She is oriented to person, place, and time. She appears well-developed and well-nourished.  HENT:  Head: Normocephalic and atraumatic.  Eyes: EOM are normal.  Neck: Normal range of motion.  Cardiovascular: Normal rate and regular rhythm.  Pulmonary/Chest: Effort normal and breath sounds normal. No respiratory distress. She has no wheezes. She has no rales.  Abdominal: Soft. Bowel sounds are normal. She exhibits no distension. There is no tenderness. There is no rebound.  Musculoskeletal: She exhibits no edema.  Neurological: She is alert and oriented to person, place, and time. Coordination normal.  Skin: Skin is warm and dry.  Rash on lower leg appears to be contact dermatitis.   Psychiatric: She has a  normal mood and affect.   Vitals:   03/17/18 0940  BP: 138/88  Pulse: 65  Temp: 97.8 F (36.6 C)  TempSrc: Oral  SpO2: 98%  Weight: 192 lb (87.1 kg)  Height: 5\' 2"  (1.575 m)      Assessment & Plan:  Flu shot given at visit

## 2018-03-17 NOTE — Patient Instructions (Addendum)
We have sent in the voltaren gel to use for pain on the knees up to 4 times per day.   We have sent in triamcinolone cream to use on your leg twice a day until gone.

## 2018-03-18 DIAGNOSIS — R21 Rash and other nonspecific skin eruption: Secondary | ICD-10-CM | POA: Insufficient documentation

## 2018-03-18 NOTE — Assessment & Plan Note (Signed)
BP at goal today. Recent CMP okay and continue spironolactone.

## 2018-03-18 NOTE — Assessment & Plan Note (Signed)
Appears to be contact dermatitis. Rx for triamcinolone cream.

## 2018-03-18 NOTE — Assessment & Plan Note (Signed)
Rx for voltaren gel for pain. Can use tylenol otc as well.

## 2018-03-20 ENCOUNTER — Emergency Department (HOSPITAL_COMMUNITY)
Admission: EM | Admit: 2018-03-20 | Discharge: 2018-03-20 | Disposition: A | Discharge status: Home / Self Care | Payer: BC Managed Care – PPO | Attending: Emergency Medicine | Admitting: Emergency Medicine

## 2018-03-20 ENCOUNTER — Encounter (HOSPITAL_COMMUNITY): Payer: Self-pay | Admitting: Emergency Medicine

## 2018-03-20 DIAGNOSIS — I10 Essential (primary) hypertension: Secondary | ICD-10-CM | POA: Diagnosis not present

## 2018-03-20 DIAGNOSIS — Z79899 Other long term (current) drug therapy: Secondary | ICD-10-CM | POA: Insufficient documentation

## 2018-03-20 DIAGNOSIS — J4520 Mild intermittent asthma, uncomplicated: Secondary | ICD-10-CM | POA: Insufficient documentation

## 2018-03-20 DIAGNOSIS — R21 Rash and other nonspecific skin eruption: Secondary | ICD-10-CM | POA: Insufficient documentation

## 2018-03-20 DIAGNOSIS — Z7982 Long term (current) use of aspirin: Secondary | ICD-10-CM | POA: Insufficient documentation

## 2018-03-20 MED ORDER — FAMOTIDINE 20 MG PO TABS: 20.0000 mg | ORAL_TABLET | Freq: Every day | ORAL | 0 refills | Status: DC

## 2018-03-20 MED ORDER — PREDNISONE 10 MG (21) PO TBPK: ORAL_TABLET | Freq: Every day | ORAL | 0 refills | Status: DC

## 2018-03-20 MED ORDER — DEXAMETHASONE SODIUM PHOSPHATE 10 MG/ML IJ SOLN
10.0000 mg | Freq: Once | INTRAMUSCULAR | Status: AC
  Administered 2018-03-20: 10 mg via INTRAMUSCULAR
  Filled 2018-03-20: qty 1

## 2018-03-20 NOTE — ED Notes (Signed)
Pt discharged from ED; instructions provided and scripts given; Pt encouraged to return to ED if symptoms worsen and to f/u with PCP; Pt verbalized understanding of all instructions 

## 2018-03-20 NOTE — ED Provider Notes (Signed)
[facility] EMERGENCY DEPARTMENT Provider Note   CSN: [id number] Arrival date & time: 03/20/18  0507     History   Chief Complaint Chief Complaint  Patient presents with  . Urticaria    HPI Catherine Hoffman is a 77 y.o. female in for evaluation of rash.  Patient states she has had a rash for the past 4 days.  Patient initially states rash began after her flu shot 4 days ago, however then states she had a rash on her leg prior to that event.  Patient reports rash is pruritic.  It is mostly around her neck where her collar for short with me.  No rash elsewhere.  She has been taking Benadryl and using triamcinolone cream with improvement of symptoms.  However this morning she felt like she had some increased difficulty breathing and got very anxious, and came to the emergency room for evaluation.  Patient states she has a history of allergies and is allergic to many different plants and substances.  She denies fevers, chills, current chest pain, feeling like her throat was closing, nausea, vomiting, abdominal pain.  She denies new exposures, soaps, detergents, shampoos, or medications.  Patient denies feeling short of breath, but states when she takes a deep breath in she feels like she needs to cough.  HPI  Past Medical History:  Diagnosis Date  . Arthritis   . Episodic tension type headache   . Esophageal reflux   . GERD (gastroesophageal reflux disease)   . Hyperlipidemia   . Hypertension   . Internal hemorrhoids   . Kidney stone on left side Hx-date unknown  . Left rotator cuff tear     Patient Active Problem List   Diagnosis Date Noted  . Rash 03/18/2018  . Venous insufficiency 10/21/2016  . Greater trochanteric bursitis of right hip 07/24/2016  . Routine general medical examination at a health care facility 02/27/2016  . Asthma, mild intermittent 07/19/2015  . Polypharmacy 11/24/2014  . Paresthesias 08/18/2014  . Arthritis of knee, degenerative  11/17/2011  . Essential hypertension 09/28/2010  . RENAL CALCULUS 08/08/2010  . Hyperlipidemia 02/25/2009  . Low back pain 10/04/2008  . Chronic allergic rhinitis 09/21/2008  . Obesity 06/30/2007  . ACID REFLUX DISEASE 06/30/2007    Past Surgical History:  Procedure Laterality Date  . COLONOSCOPY  11/20/2008  . KNEE ARTHROSCOPY Left 2004  . SHOULDER OPEN ROTATOR CUFF REPAIR Left 08/30/2012   Procedure: ROTATOR CUFF REPAIR SHOULDER OPEN, POSSIBLE GRAFT AND ANCHORS;  Surgeon: Jacki Cones, MD;  Location: [facility] [place];  Service: Orthopedics;  Laterality: Left;  . TOTAL ABDOMINAL HYSTERECTOMY W/ BILATERAL SALPINGOOPHORECTOMY  1990's     OB History   None      Home Medications    Prior to Admission medications   Medication Sig Start Date End Date Taking? Authorizing Provider  aspirin 81 MG tablet Take 81 mg by mouth daily.     Yes [provider]  calcium-vitamin D (OSCAL WITH D) 500-200 MG-UNIT per tablet Take 1 tablet by mouth daily with breakfast.    Yes [provider]  cyclobenzaprine (FLEXERIL) 5 MG tablet Take 1 tablet (5 mg total) by mouth 3 (three) times daily as needed for muscle spasms. 07/24/16  Yes Myrlene Broker, MD  diclofenac sodium (VOLTAREN) 1 % GEL Apply 2 g topically 4 (four) times daily. 03/17/18  Yes Myrlene Broker, MD  estradiol (ESTRACE) 0.5 MG tablet Take 0.5 mg by mouth daily.  05/28/17  Yes [provider]  fluticasone (FLONASE) 50 MCG/ACT nasal spray SHAKE LIQUID AND USE 2 SPRAYS IN EACH NOSTRIL DAILY Patient taking differently: Place 1 spray into both nostrils daily as needed for allergies.  11/27/16  Yes Myrlene Broker, MD  latanoprost (XALATAN) 0.005 % ophthalmic solution Place 1 drop into both eyes at bedtime.   Yes [provider]  meloxicam (MOBIC) 7.5 MG tablet TAKE 1 TABLET(7.5 MG) BY MOUTH DAILY Patient taking differently: Take 7.5 mg by mouth daily as needed for pain.  08/12/17   Yes Myrlene Broker, MD  omeprazole (PRILOSEC) 40 MG capsule TAKE ONE CAPSULE BY MOUTH TWICE DAILY 30 MINUTES BEFORE FIRST AND LAST MEAL OF THE DAY Patient taking differently: Take 40 mg by mouth 2 (two) times daily.  02/03/17  Yes Nyoka Cowden, MD  ondansetron (ZOFRAN) 4 MG tablet Take 4 mg by mouth every 8 (eight) hours as needed for nausea or vomiting (Take 1 tablet by mouth every eight hours as needed for nausea). 12/08/16  Yes Regal, Kirstie Peri, DPM  potassium chloride SA (K-DUR,KLOR-CON) 20 MEQ tablet TAKE 1 TABLET(20 MEQ) BY MOUTH DAILY... NEED OFFICE VISIT FOR REFILLS Patient taking differently: Take 20 mEq by mouth daily.  03/23/17  Yes Myrlene Broker, MD  spironolactone (ALDACTONE) 25 MG tablet TAKE 1 TABLET BY MOUTH TWICE DAILY...KEEP 03/09/17 APPOINTMENT FOR FURTHER REFILLS Patient taking differently: Take 25 mg by mouth 2 (two) times daily.  09/01/17  Yes Myrlene Broker, MD  triamcinolone cream (KENALOG) 0.1 % Apply 1 application topically 2 (two) times daily. 03/17/18  Yes Myrlene Broker, MD  famotidine (PEPCID) 20 MG tablet Take 1 tablet (20 mg total) by mouth daily. 03/20/18   Rosealie Reach, PA-C  predniSONE (STERAPRED UNI-PAK 21 TAB) 10 MG (21) TBPK tablet Take by mouth daily. Take 6 tabs by mouth daily  for 2 days, then 5 tabs for 2 days, then 4 tabs for 2 days, then 3 tabs for 2 days, 2 tabs for 2 days, then 1 tab by mouth daily for 2 days 03/20/18   Jonisha Kindig, PA-C    Family History Family History  Problem Relation Age of Onset  . Hypertension Mother   . Diabetes Mother   . Cancer Mother        in leg  . Varicose Veins Maternal Uncle   . Colon cancer Neg Hx     Social History Social History   Tobacco Use  . Smoking status: Never Smoker  . Smokeless tobacco: Never Used  Substance Use Topics  . Alcohol use: No  . Drug use: No     Allergies   Hydrocodone-acetaminophen and Tramadol   Review of Systems Review of Systems    Respiratory: Positive for cough (when taking a deep breath in).   Skin: Positive for rash.  All other systems reviewed and are negative.    Physical Exam Updated Vital Signs BP (!) 142/73 (BP Location: Right Arm)   Pulse (!) 55   Temp 98 F (36.7 C) (Oral)   Resp 11   Ht 5\' 1"  (1.549 m)   Wt 83 kg   SpO2 100%   BMI 34.58 kg/m   Physical Exam  Constitutional: She is oriented to person, place, and time. She appears well-developed and well-nourished. No distress.  Elderly female resting comfortably in the bed in no acute distress  HENT:  Head: Normocephalic and atraumatic.  Eyes: Pupils are equal, round, and reactive to light. Conjunctivae and EOM  are normal.  Neck: Normal range of motion. Neck supple.  Cardiovascular: Normal rate, regular rhythm and intact distal pulses.  No tachycardia  Pulmonary/Chest: Effort normal and breath sounds normal. No accessory muscle usage. No tachypnea. No respiratory distress. She has no decreased breath sounds. She has no wheezes. She has no rhonchi. She has no rales.  Speaking in full sentences.  Clear lung sounds in all fields.  No respiratory distress.  Airway intact.  Abdominal: Soft. She exhibits no distension. There is no tenderness.  Musculoskeletal: Normal range of motion.  Neurological: She is alert and oriented to person, place, and time.  Skin: Skin is warm and dry. Capillary refill takes less than 2 seconds. Rash noted.  Linear rash on anterior shin and anterior neck around where a short collar would be.  No rash noted elsewhere on the body including on the upper extremities, torso, or back.  No mucous membrane involvement.  No blisters or bulla.  No urticaria.  Psychiatric: She has a normal mood and affect.  Nursing note and vitals reviewed.    ED Treatments / Results  Labs (all labs ordered are listed, but only abnormal results are displayed) Labs Reviewed - No data to display  EKG None  Radiology No results  found.  Procedures Procedures (including critical care time)  Medications Ordered in ED Medications  dexamethasone (DECADRON) injection 10 mg (has no administration in time range)     Initial Impression / Assessment and Plan / ED Course  I have reviewed the triage vital signs and the nursing notes.  Pertinent labs & imaging results that were available during my care of the patient were reviewed by me and considered in my medical decision making (see chart for details).     Pt presenting for evaluation of rash.  Physical exam reassuring, she is afebrile not tachycardic.  Appears nontoxic.  No respiratory distress, tachycardia, or signs of anaphylaxis.  Rash is linear in nature and consistent with dermatitis.  Rashes present in exposed areas, no rash on torso/trunk.  Doubt that this is a reaction to the flu shot.  Rash is not consistent with SJS or TEN. Patient without shortness of breath or chest pain while in the ER.  With deep respiration, patient occasionally coughs, doubt this is related to her rash.  Patient without a history of diabetes, is not on ACE inhibitors.  Will start on prednisone and Pepcid for symptom control.  Patient is already taking Benadryl and triamcinolone cream.  Discussed findings with patient.  Case discussed with attending, Dr. Lynelle Doctor evaluated the patient.  Will have patient treat symptomatically and follow-up with her PCP for further evaluation.  At this time, patient appears safe for discharge.  Return precautions given.  Patient states she understands and agrees plan.   Final Clinical Impressions(s) / ED Diagnoses   Final diagnoses:  Rash and nonspecific skin eruption    ED Discharge Orders         Ordered    predniSONE (STERAPRED UNI-PAK 21 TAB) 10 MG (21) TBPK tablet  Daily     03/20/18 0631    famotidine (PEPCID) 20 MG tablet  Daily     03/20/18 0631           Alveria Apley, PA-C 03/20/18 2956    Devoria Albe, MD 03/20/18 [phone]

## 2018-03-20 NOTE — ED Provider Notes (Signed)
Patient reports itching and small lesions on her arms, legs, and around her neck that have been present and got worse after she got a flu shot 3 days ago.  She states this morning she felt like she was having difficulty breathing which prompted her ED visit.  However she took 2 Benadryl at home which improved her symptoms.  She is not on ACE inhibitor.  Patient is awake and alert.  She does not have any obvious urticaria at this time.  Her speech is normal and she does not appear to be in respiratory distress.  She states the feeling of her throat being constricted is gone.  Medical screening examination/treatment/procedure(s) were conducted as a shared visit with non-physician practitioner(s) and myself.  I personally evaluated the patient during the encounter.  None   Devoria Albe, MD, Concha Pyo, MD 03/20/18 [phone]

## 2018-03-20 NOTE — ED Triage Notes (Signed)
Pt reports she had a flu shot on Thursday and ever since then she has had a "reaction" including hives and itchiness.  She started taking benadryl on Thursday afternoon and took one right before she came to the hospital.   Pt reports she feels SOB however she does not appear to be in respiratory distress.  She has developed a cough w/in the past couple of minutes.  Apparently this has happened before.

## 2018-03-20 NOTE — Discharge Instructions (Addendum)
Take prednisone as prescribed. Take the entire course.  Take Pepcid daily to help with itching.  Continue using triamcinolone cream and benadryl as needed for itching.  Follow up with your primary care doctor if your symptoms are not improving.  Return to the ER if you develop persistent chest pain, continued difficulty breathing, or any new or concerning symptoms.

## 2018-03-21 ENCOUNTER — Other Ambulatory Visit: Payer: Self-pay | Admitting: Internal Medicine

## 2018-03-29 ENCOUNTER — Other Ambulatory Visit: Payer: Self-pay | Admitting: Internal Medicine

## 2018-04-20 ENCOUNTER — Ambulatory Visit: Payer: Self-pay

## 2018-04-20 DIAGNOSIS — R300 Dysuria: Secondary | ICD-10-CM | POA: Diagnosis not present

## 2018-04-20 DIAGNOSIS — M545 Low back pain: Secondary | ICD-10-CM | POA: Diagnosis not present

## 2018-04-20 DIAGNOSIS — N200 Calculus of kidney: Secondary | ICD-10-CM | POA: Diagnosis not present

## 2018-04-20 NOTE — Telephone Encounter (Signed)
Returned call to patient who has been to see her kidney doctor today because she is experiencing back pain x 3 weeks. She states she thought she had a kidney stone. She states Dr McDermon advised her to call her PCP. She states that the pain is low in her back and hip and travels to her calf. Left side.  She has no weakness or numbness to the extremities. No bowel or bladder problems. She states that she can not sit with pressure to the hip or lay on her left side without severe pain. Pt denies and lifting or injury to her back. 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 She states that she has received pain medication form Dr Okey Duprerawford but is still having problems. Appointment made per protocol. Care advice read to patient.  Patient verbalized understanding of all instructions.  Reason for Disposition . [1] MODERATE back pain (e.g., interferes with normal activities) AND [2] present > 3 days  Answer Assessment - Initial Assessment Questions 1. ONSET: "When did the pain begin?"      3 weeks 2. LOCATION: "Where does it hurt?" (upper, mid or lower back)     Left lower back hip down to calf of leg 3. SEVERITY: "How bad is the pain?"  (e.g., Scale 1-10; mild, moderate, or severe)   - MILD (1-3): doesn't interfere with normal activities    - MODERATE (4-7): interferes with normal activities or awakens from sleep    - SEVERE (8-10): excruciating pain, unable to do any normal activities      Severe sitting or standing 4. PATTERN: "Is the pain constant?" (e.g., yes, no; constant, intermittent)      constant 5. RADIATION: "Does the pain shoot into your legs or elsewhere?"     Down leg 6. CAUSE:  "What do you think is causing the back pain?"      Thought a kidney r/o Dr McDermon 7. 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 MEDICATIONS: "What have you taken so far for the pain?" (e.g.,  nothing, acetaminophen, NSAIDS)     tylenol 9. NEUROLOGIC SYMPTOMS: "Do you have any weakness, numbness, or problems with bowel/bladder control?"     No  10. OTHER SYMPTOMS: "Do you have any other symptoms?" (e.g., fever, abdominal pain, burning with urination, blood in urine)       no 11. PREGNANCY: "Is there any chance you are pregnant?" (e.g., yes, no; LMP)       N/A  Protocols used: BACK PAIN-A-AH

## 2018-04-20 NOTE — Telephone Encounter (Signed)
appt changed to [place] at 10am. Was with Lawerance BachBurns

## 2018-04-21 ENCOUNTER — Ambulatory Visit: Payer: Self-pay | Admitting: Internal Medicine

## 2018-04-21 ENCOUNTER — Encounter: Payer: Self-pay | Admitting: Internal Medicine

## 2018-04-21 ENCOUNTER — Ambulatory Visit (INDEPENDENT_AMBULATORY_CARE_PROVIDER_SITE_OTHER)
Admission: RE | Admit: 2018-04-21 | Discharge: 2018-04-21 | Disposition: A | Discharge status: Home / Self Care | Payer: BC Managed Care – PPO | Source: Ambulatory Visit | Attending: Internal Medicine | Admitting: Internal Medicine

## 2018-04-21 ENCOUNTER — Ambulatory Visit (INDEPENDENT_AMBULATORY_CARE_PROVIDER_SITE_OTHER): Payer: BC Managed Care – PPO | Admitting: Internal Medicine

## 2018-04-21 ENCOUNTER — Other Ambulatory Visit: Payer: Self-pay

## 2018-04-21 VITALS — BP 130/64 | HR 71 | Temp 97.6°F | Ht 61.0 in | Wt 198.0 lb

## 2018-04-21 DIAGNOSIS — M5442 Lumbago with sciatica, left side: Secondary | ICD-10-CM | POA: Diagnosis not present

## 2018-04-21 DIAGNOSIS — G8929 Other chronic pain: Secondary | ICD-10-CM | POA: Diagnosis not present

## 2018-04-21 MED ORDER — METHYLPREDNISOLONE ACETATE 40 MG/ML IJ SUSP
40.0000 mg | Freq: Once | INTRAMUSCULAR | Status: AC
  Administered 2018-04-21: 40 mg via INTRAMUSCULAR

## 2018-04-21 MED ORDER — PREDNISONE 20 MG PO TABS: 40.0000 mg | ORAL_TABLET | Freq: Every day | ORAL | 0 refills | Status: DC

## 2018-04-21 NOTE — Progress Notes (Signed)
   Subjective:    Patient ID: Catherine Hoffman, female    DOB: June 14, 1940, 77 y.o.   MRN: [id number]  HPI The patient is a 77 YO female coming in for ongoing lower back pain which is acutely worse. Started some time ago, she cannot clarify exactly when but perhaps in the last few weeks. She denies falls, injury or overuse. She does have pain going down the left leg as well. Denies numbness or weakness in either leg. Denies change in bowel or bladder. Taking meloxicam and tylenol which are helping some.   Review of Systems  Constitutional: Positive for activity change. Negative for appetite change, chills, fatigue, fever and unexpected weight change.  Respiratory: Negative.   Cardiovascular: Negative.   Gastrointestinal: Negative.   Musculoskeletal: Positive for arthralgias, back pain and myalgias. Negative for gait problem and joint swelling.  Skin: Negative.   Neurological: Negative.       Objective:   Physical Exam  Constitutional: She is oriented to person, place, and time. She appears well-developed and well-nourished.  HENT:  Head: Normocephalic and atraumatic.  Eyes: EOM are normal.  Neck: Normal range of motion.  Cardiovascular: Normal rate and regular rhythm.  Pulmonary/Chest: Effort normal and breath sounds normal. No respiratory distress. She has no wheezes. She has no rales.  Musculoskeletal: She exhibits tenderness. She exhibits no edema.  Tenderness lumbar midline and left SI region  Neurological: She is alert and oriented to person, place, and time. Coordination normal.  Skin: Skin is warm and dry.   Vitals:   04/21/18 1004  BP: 130/64  Pulse: 71  Temp: 97.6 F (36.4 C)  TempSrc: Oral  SpO2: 94%  Weight: 198 lb (89.8 kg)  Height: 5\' 1"  (1.549 m)      Assessment & Plan:  Depo-medrol 40 mg IM given at visit

## 2018-04-21 NOTE — Patient Instructions (Signed)
We have given you a shot today and are checking an x-ray.   We have sent in prednisone to take 2 pills daily for 4 days.

## 2018-04-21 NOTE — Assessment & Plan Note (Signed)
Depo-medrol given at visit and prednisone rx. Checking x-ray lumbar today.

## 2018-06-06 ENCOUNTER — Encounter: Payer: Self-pay | Admitting: Internal Medicine

## 2018-06-06 ENCOUNTER — Ambulatory Visit (INDEPENDENT_AMBULATORY_CARE_PROVIDER_SITE_OTHER): Payer: BC Managed Care – PPO | Admitting: Internal Medicine

## 2018-06-06 VITALS — BP 130/78 | HR 67 | Temp 98.1°F | Ht 61.0 in | Wt 193.0 lb

## 2018-06-06 DIAGNOSIS — G8929 Other chronic pain: Secondary | ICD-10-CM | POA: Diagnosis not present

## 2018-06-06 DIAGNOSIS — M5442 Lumbago with sciatica, left side: Secondary | ICD-10-CM

## 2018-06-06 MED ORDER — MELOXICAM 15 MG PO TABS: 15.0000 mg | ORAL_TABLET | Freq: Every day | ORAL | 1 refills | Status: DC

## 2018-06-06 NOTE — Patient Instructions (Signed)
We will increase the dose of the meloxicam to 15 mg daily. You can take 2 pills daily with what you have left and when you refill it just take 1 pill daily.   We will get you in with Dr. Katrinka BlazingSmith to see what he can do with the back.

## 2018-06-06 NOTE — Assessment & Plan Note (Signed)
Increase meloxicam to 15 mg daily. Refer to sports medicine per patient request (a friend of hers went to Dr. Katrinka BlazingSmith for her back pain and was helped well). No imaging as prior x-ray okay without change since that time.

## 2018-06-06 NOTE — Progress Notes (Signed)
   Subjective:   Patient ID: Catherine Hoffman, female    DOB: [birth year], 77 y.o.   MRN: [id number]  HPI The patient is a 77 YO female coming in for back pain. Started years ago and is getting worse in the last month or so. Had a flare of this back in November which did respond to prednisone but this only lasted about 2 weeks. She is taking tylenol and meloxicam currently which is helping a little. Not as much as it used to help. Overall it is worsening with the cold weather recently. Denies overuse or injury at the onset. Denies weakness or numbness in the legs. Pain does radiate down the left leg and into the left hip. Recent x-ray at our last visit with some curve in the spine and arthritis all over the low back.   Review of Systems  Constitutional: Positive for activity change. Negative for appetite change, chills, fatigue, fever and unexpected weight change.  Respiratory: Negative.   Cardiovascular: Negative.   Gastrointestinal: Negative.   Musculoskeletal: Positive for arthralgias, back pain and myalgias. Negative for gait problem and joint swelling.  Skin: Negative.   Neurological: Negative.     Objective:  Physical Exam Constitutional:      Appearance: She is well-developed.  HENT:     Head: Normocephalic and atraumatic.  Neck:     Musculoskeletal: Normal range of motion.  Cardiovascular:     Rate and Rhythm: Normal rate and regular rhythm.  Pulmonary:     Effort: Pulmonary effort is normal. No respiratory distress.     Breath sounds: Normal breath sounds. No wheezing or rales.  Abdominal:     General: Bowel sounds are normal. There is no distension.     Palpations: Abdomen is soft.     Tenderness: There is no abdominal tenderness. There is no rebound.  Musculoskeletal:        General: Tenderness present.  Skin:    General: Skin is warm and dry.  Neurological:     Mental Status: She is alert and oriented to person, place, and time.     Coordination: Coordination normal.       Vitals:   06/06/18 0820  BP: 130/78  Pulse: 67  Temp: 98.1 F (36.7 C)  TempSrc: Oral  SpO2: 97%  Weight: 193 lb (87.5 kg)  Height: 5\' 1"  (1.549 m)    Assessment & Plan:

## 2018-06-27 NOTE — Progress Notes (Signed)
[name] [address] [place], [place] [address] Phone: [phone] Subjective:   Catherine Hoffman, am serving as a scribe for Dr. Antoine Primas.  I'm seeing this patient by the request  of:  Myrlene Broker, MD   CC: Low back pain  [id number]  Catherine Hoffman is a 78 y.o. female coming in with complaint of back pain for approximately 2 years. When pain started it was localized to lower back. Over the past year pain has increased and is radiating from left glute to the calf. Pain is intermittent. The weather makes her pain increase. Does use Tylenol, Mobic, and heating pad for pain. Pain also increase with seated position as well as standing for prolonged periods. Pain keeps patient from lying on that side at night.   Patient did have x-rays taken April 21, 2018.  Bad have lumbar scoliosis concave right with diffuse degenerative changes mild to moderate  Past Medical History:  Diagnosis Date  . Arthritis   . Episodic tension type headache   . Esophageal reflux   . GERD (gastroesophageal reflux disease)   . Hyperlipidemia   . Hypertension   . Internal hemorrhoids   . Kidney stone on left side Hx-date unknown  . Left rotator cuff tear    Past Surgical History:  Procedure Laterality Date  . COLONOSCOPY  11/20/2008  . KNEE ARTHROSCOPY Left 2004  . SHOULDER OPEN ROTATOR CUFF REPAIR Left 08/30/2012   Procedure: ROTATOR CUFF REPAIR SHOULDER OPEN, POSSIBLE GRAFT AND ANCHORS;  Surgeon: Jacki Cones, MD;  Location: [facility];  Service: Orthopedics;  Laterality: Left;  . TOTAL ABDOMINAL HYSTERECTOMY W/ BILATERAL SALPINGOOPHORECTOMY  1990's   Social History   Socioeconomic History  . Marital status: Widowed    Spouse name: Not on file  . Number of children: 2  . Years of education: Not on file  . Highest education level: Not on file  Occupational History  . Occupation: Lexicographer  Social Needs  . Financial resource  strain: Not hard at all  . Food insecurity:    Worry: Never true    Inability: Never true  . Transportation needs:    Medical: No    Non-medical: No  Tobacco Use  . Smoking status: Never Smoker  . Smokeless tobacco: Never Used  Substance and Sexual Activity  . Alcohol use: No  . Drug use: No  . Sexual activity: Never  Lifestyle  . Physical activity:    Days per week: 0 days    Minutes per session: 0 min  . Stress: Not at all  Relationships  . Social connections:    Talks on phone: More than three times a week    Gets together: More than three times a week    Attends religious service: More than 4 times per year    Active member of club or organization: Not on file    Attends meetings of clubs or organizations: More than 4 times per year    Relationship status: Widowed  Other Topics Concern  . Not on file  Social History Narrative  . Not on file   Allergies  Allergen Reactions  . Hydrocodone-Acetaminophen     REACTION: GI upset  . Tramadol Itching   Family History  Problem Relation Age of Onset  . Hypertension Mother   . Diabetes Mother   . Cancer Mother        in leg  . Varicose Veins Maternal Uncle   .  Colon cancer Neg Hx     Current Outpatient Medications (Endocrine & Metabolic):  .  estradiol (ESTRACE) 0.5 MG tablet, Take 0.5 mg by mouth daily.   Current Outpatient Medications (Cardiovascular):  .  spironolactone (ALDACTONE) 25 MG tablet, TAKE 1 TABLET BY MOUTH TWICE DAILY...KEEP 03/09/17 APPOINTMENT FOR FURTHER REFILLS (Patient taking differently: Take 25 mg by mouth 2 (two) times daily. )  Current Outpatient Medications (Respiratory):  .  fluticasone (FLONASE) 50 MCG/ACT nasal spray, SHAKE LIQUID AND USE 2 SPRAYS IN EACH NOSTRIL DAILY (Patient taking differently: Place 1 spray into both nostrils daily as needed for allergies. )  Current Outpatient Medications (Analgesics):  .  aspirin 81 MG tablet, Take 81 mg by mouth daily.   .  meloxicam (MOBIC) 15 MG  tablet, Take 1 tablet (15 mg total) by mouth daily.   Current Outpatient Medications (Other):  .  calcium-vitamin D (OSCAL WITH D) 500-200 MG-UNIT per tablet, Take 1 tablet by mouth daily with breakfast.  .  cyclobenzaprine (FLEXERIL) 5 MG tablet, Take 1 tablet (5 mg total) by mouth 3 (three) times daily as needed for muscle spasms. .  diclofenac sodium (VOLTAREN) 1 % GEL, Apply 2 g topically 4 (four) times daily. .  famotidine (PEPCID) 20 MG tablet, Take 1 tablet (20 mg total) by mouth daily. Marland Kitchen.  latanoprost (XALATAN) 0.005 % ophthalmic solution, Place 1 drop into both eyes at bedtime. Marland Kitchen.  omeprazole (PRILOSEC) 40 MG capsule, TAKE 1 CAPSULE BY MOUTH TWICE DAILY 30 MINUTES BEFORE FIRST AND LAST MEAL OF THE DAY .  ondansetron (ZOFRAN) 4 MG tablet, Take 4 mg by mouth every 8 (eight) hours as needed for nausea or vomiting (Take 1 tablet by mouth every eight hours as needed for nausea). .  potassium chloride SA (K-DUR,KLOR-CON) 20 MEQ tablet, TAKE 1 TABLET(20 MEQ) BY MOUTH DAILY... NEED OFFICE VISIT FOR REFILLS (Patient taking differently: Take 20 mEq by mouth daily. ) .  triamcinolone cream (KENALOG) 0.1 %, Apply 1 application topically 2 (two) times daily. Marland Kitchen.  gabapentin (NEURONTIN) 100 MG capsule, Take 2 capsules (200 mg total) by mouth at bedtime.    Past medical history, social, surgical and family history all reviewed in electronic medical record.  No pertanent information unless stated regarding to the chief complaint.   Review of Systems:  No headache, visual changes, nausea, vomiting, diarrhea, constipation, dizziness, abdominal pain, skin rash, fevers, chills, night sweats, weight loss, swollen lymph nodes, body aches, joint swelling,, chest pain, shortness of breath, mood changes.  Positive muscle aches  Objective  Blood pressure 122/76, pulse 78, height 5\' 1"  (1.549 m), weight 193 lb (87.5 kg), SpO2 93 %.    General: No apparent distress alert and oriented x3 mood and affect normal,  dressed appropriately.  HEENT: Pupils equal, extraocular movements intact  Respiratory: Patient's speak in full sentences and does not appear short of breath  Cardiovascular: trace lower extremity edema, non tender, no erythema  Skin: Warm dry intact with no signs of infection or rash on extremities or on axial skeleton.  Abdomen: Soft nontender  Neuro: Cranial nerves II through XII are intact, neurovascularly intact in all extremities with 2+ DTRs and 2+ pulses.  Lymph: No lymphadenopathy of posterior or anterior cervical chain or axillae bilaterally.  Gait antagic  MSK:  Non tender with full range of motion and good stability and symmetric strength and tone of shoulders, elbows, wrist, hip, and ankles bilaterally. Severe knee arthritis bilaterally.  Back Exam:  Inspection: loss of lordosis  Motion: Flexion 45 deg, Extension 25 deg, Side Bending to 20 deg bilaterally,  Rotation to 20 deg bilaterally  SLR laying: Negative  XSLR laying: Negative  Palpable tenderness: Tender to palpation over SI joints bilaterally.  Marland Kitchen. FABER: unable to d o2/2 to tightness. . Sensory change: Gross sensation intact to all lumbar and sacral dermatomes.  Reflexes: 2+ at both patellar tendons, 2+ at achilles tendons, Babinski's downgoing.  Strength at foot  Plantar-flexion: 4/5 but symmetric   [id number]; 15 additional minutes spent for Therapeutic exercises as stated in above notes.  This included exercises focusing on stretching, strengthening, with significant focus on eccentric aspects.   Long term goals include an improvement in range of motion, strength, endurance as well as avoiding reinjury. Patient's frequency would include in 1-2 times a day, 3-5 times a week for a duration of 6-12 weeks.  Low back exercises that included:  Pelvic tilt/bracing instruction to focus on control of the pelvic girdle and lower abdominal muscles  Glute strengthening exercises, focusing on proper firing of the glutes without engaging  the low back muscles Proper stretching techniques for maximum relief for the hamstrings, hip flexors, low back and some rotation where tolerated  Proper technique shown and discussed handout in great detail with ATC.  All questions were discussed and answered.     Impression and Recommendations:     This case required medical decision making of moderate complexity. The above documentation has been reviewed and is accurate and complete Judi SaaZachary M , DO       Note: This dictation was prepared with Dragon dictation along with smaller phrase technology. Any transcriptional errors that result from this process are unintentional.

## 2018-06-28 ENCOUNTER — Ambulatory Visit (INDEPENDENT_AMBULATORY_CARE_PROVIDER_SITE_OTHER): Payer: BC Managed Care – PPO | Admitting: Family Medicine

## 2018-06-28 ENCOUNTER — Encounter: Payer: Self-pay | Admitting: Family Medicine

## 2018-06-28 DIAGNOSIS — M5136 Other intervertebral disc degeneration, lumbar region: Secondary | ICD-10-CM | POA: Diagnosis not present

## 2018-06-28 DIAGNOSIS — M51369 Other intervertebral disc degeneration, lumbar region without mention of lumbar back pain or lower extremity pain: Secondary | ICD-10-CM | POA: Insufficient documentation

## 2018-06-28 DIAGNOSIS — M170 Bilateral primary osteoarthritis of knee: Secondary | ICD-10-CM

## 2018-06-28 MED ORDER — GABAPENTIN 100 MG PO CAPS: 200.0000 mg | ORAL_CAPSULE | Freq: Every day | ORAL | 3 refills | Status: DC

## 2018-06-28 NOTE — Assessment & Plan Note (Signed)
Likely contributing.  Patient will continue to monitor.  Will try topical anti-inflammatories.  Worsening symptoms consider injections

## 2018-06-28 NOTE — Assessment & Plan Note (Signed)
Loss of lordosis loss of lordosis loss of lordosis severely over the sacroiliac joints bilaterally degenerative disc disease lumbar degenerative disc disease lumbar moderate to severe.  Work with Event organiserathletic trainer to learn home exercises, discussed icing regimen, topical anti-inflammatories, over-the-counter medications.  With patient's other comorbidities avoiding significant number of prescription medications.  Did start gabapentin for some of the radicular symptoms.  Follow-up with me again in 4 weeks

## 2018-06-28 NOTE — Patient Instructions (Addendum)
Good to see you  Ice is your friend Ice 20 minutes 2 times daily. Usually after activity and before bed. pennsaid pinkie amount topically 2 times daily as needed. Try on knees and back  Gabapentin 200mg  at night.  If too drowsy in the mroning can do 1 pill  Stay active Over the counter get  Vitamin D 2000 Iu daily  Turmeric 500mg  daily but watch for reflux and if so then stop it  Tart cherry extract any dose at night See me again in 4-6 weeks

## 2018-07-07 DIAGNOSIS — Z01419 Encounter for gynecological examination (general) (routine) without abnormal findings: Secondary | ICD-10-CM | POA: Diagnosis not present

## 2018-07-07 DIAGNOSIS — Z1389 Encounter for screening for other disorder: Secondary | ICD-10-CM | POA: Diagnosis not present

## 2018-07-07 DIAGNOSIS — Z1231 Encounter for screening mammogram for malignant neoplasm of breast: Secondary | ICD-10-CM | POA: Diagnosis not present

## 2018-07-07 DIAGNOSIS — N959 Unspecified menopausal and perimenopausal disorder: Secondary | ICD-10-CM | POA: Diagnosis not present

## 2018-07-07 DIAGNOSIS — Z130 Encounter for screening for diseases of the blood and blood-forming organs and certain disorders involving the immune mechanism: Secondary | ICD-10-CM | POA: Diagnosis not present

## 2018-07-07 DIAGNOSIS — Z6835 Body mass index (BMI) 35.0-35.9, adult: Secondary | ICD-10-CM | POA: Diagnosis not present

## 2018-07-19 ENCOUNTER — Telehealth: Payer: Self-pay | Admitting: Internal Medicine

## 2018-07-19 MED ORDER — SPIRONOLACTONE 25 MG PO TABS: ORAL_TABLET | ORAL | 3 refills | Status: DC

## 2018-07-19 MED ORDER — POTASSIUM CHLORIDE CRYS ER 20 MEQ PO TBCR: EXTENDED_RELEASE_TABLET | ORAL | 3 refills | Status: DC

## 2018-07-19 NOTE — Telephone Encounter (Signed)
Requested medication (s) are due for refill today:  overdue  Requested medication (s) are on the active medication list:  yes  Future visit scheduled:  no  Last Refill: 03/23/17; #90; refills x 3   Requested Prescriptions  Pending Prescriptions Disp Refills   potassium chloride SA (K-DUR,KLOR-CON) 20 MEQ tablet 90 tablet 3     Endocrinology:  Minerals - Potassium Supplementation Passed - 07/19/2018 11:11 AM      Passed - K in normal range and within 360 days    Potassium  Date Value Ref Range Status  08/12/2017 4.1 3.5 - 5.1 mEq/L Final         Passed - Cr in normal range and within 360 days    Creatinine, Ser  Date Value Ref Range Status  08/12/2017 0.93 0.40 - 1.20 mg/dL Final         Passed - Valid encounter within last 12 months    Recent Outpatient Visits          3 weeks ago DDD (degenerative disc disease), lumbar   [facility] -Tod Persia, Clifton Custard, DO   1 month ago Chronic bilateral low back pain with left-sided sciatica   [facility] -Willis Modena, MD   2 months ago Chronic bilateral low back pain with left-sided sciatica   [facility] -Willis Modena, MD   4 months ago Need for influenza vaccination   [facility] -Willis Modena, MD   11 months ago Routine general medical examination at a health care facility   [facility] -Willis Modena, MD      Future Appointments            In 2 weeks Judi Saa, DO [facility]          omeprazole (PRILOSEC) 40 MG capsule 180 capsule 0     Gastroenterology: Proton Pump Inhibitors Passed - 07/19/2018 11:11 AM      Passed - Valid encounter within last 12 months    Recent Outpatient Visits          3 weeks ago DDD (degenerative disc disease), lumbar   [facility] -Tod Persia, Clifton Custard, DO   1 month ago Chronic  bilateral low back pain with left-sided sciatica   [facility] -Willis Modena, MD   2 months ago Chronic bilateral low back pain with left-sided sciatica   [facility] -Willis Modena, MD   4 months ago Need for influenza vaccination   [facility] -Willis Modena, MD   11 months ago Routine general medical examination at a health care facility   [facility] -Willis Modena, MD      Future Appointments            In 2 weeks Judi Saa, DO  [facility]

## 2018-07-19 NOTE — Telephone Encounter (Signed)
Copied from CRM [id number]. Topic: Quick Communication - Rx Refill/Question >> Jul 19, 2018 11:03 AM Crist Infante wrote: Medication: potassium chloride SA (K-DUR,KLOR-CON) 20 MEQ tablet omeprazole (PRILOSEC) 40 MG capsule  Pt request 90 day  [id number] - [place], [place] - [address] AT [facility] [address] [phone] (Phone) [phone] (Fax)

## 2018-07-19 NOTE — Telephone Encounter (Signed)
This has already been sent today.

## 2018-07-19 NOTE — Telephone Encounter (Signed)
Pt also needs spironolactone 25 mg #90 [facility] [address]

## 2018-07-25 ENCOUNTER — Other Ambulatory Visit: Payer: Self-pay

## 2018-07-25 MED ORDER — OMEPRAZOLE 40 MG PO CPDR: DELAYED_RELEASE_CAPSULE | ORAL | 0 refills | Status: DC

## 2018-08-07 NOTE — Progress Notes (Signed)
[name] [address] [place], [place] [address] Phone: [phone] Subjective:    I'm seeing this patient by the request  of:    CC: Back pain and bilateral knee pain follow-up  [id number]    06/28/2018: Loss of lordosis loss of lordosis loss of lordosis severely over the sacroiliac joints bilaterally degenerative disc disease lumbar degenerative disc disease lumbar moderate to severe.  Work with Event organiser to learn home exercises, discussed icing regimen, topical anti-inflammatories, over-the-counter medications.  With patient's other comorbidities avoiding significant number of prescription medications.  Did start gabapentin for some of the radicular symptoms.  Follow-up with me again in 4 weeks  08/08/2018: Catherine Hoffman is a 78 y.o. female coming in with complaint of back pain. Has had increase in pain recently due to weather in which she has to work in. Has been using supplements which she feels are helping.  Patient states that if anything seems to be worsening pain again.  Seems that the weather causes trouble.  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 Arthritis   . Episodic tension type headache   . Esophageal reflux   . GERD (gastroesophageal reflux disease)   . Hyperlipidemia   . Hypertension   . Internal hemorrhoids   . Kidney stone on left side Hx-date unknown  . Left rotator cuff tear    Past Surgical History:  Procedure Laterality Date  . COLONOSCOPY  11/20/2008  . KNEE ARTHROSCOPY Left 2004  . SHOULDER OPEN ROTATOR CUFF REPAIR Left 08/30/2012   Procedure: ROTATOR CUFF REPAIR SHOULDER OPEN, POSSIBLE GRAFT AND ANCHORS;  Surgeon: Jacki Cones, MD;  Location: [facility];  Service: Orthopedics;  Laterality: Left;  . TOTAL ABDOMINAL HYSTERECTOMY W/ BILATERAL SALPINGOOPHORECTOMY  1990's   Social History   Socioeconomic History  .  Marital status: Widowed    Spouse name: Not on file  . Number of children: 2  . Years of education: Not on file  . Highest education level: Not on file  Occupational History  . Occupation: Lexicographer  Social Needs  . Financial resource strain: Not hard at all  . Food insecurity:    Worry: Never true    Inability: Never true  . Transportation needs:    Medical: No    Non-medical: No  Tobacco Use  . Smoking status: Never Smoker  . Smokeless tobacco: Never Used  Substance and Sexual Activity  . Alcohol use: No  . Drug use: No  . Sexual activity: Never  Lifestyle  . Physical activity:    Days per week: 0 days    Minutes per session: 0 min  . Stress: Not at all  Relationships  . Social connections:    Talks on phone: More than three times a week    Gets together: More than three times a week    Attends religious service: More than 4 times per year    Active member of club or organization: Not on file    Attends meetings of clubs or organizations: More than 4 times per year    Relationship status: Widowed  Other Topics Concern  . Not on file  Social History Narrative  . Not on file   Allergies  Allergen Reactions  . Hydrocodone-Acetaminophen     REACTION: GI upset  . Tramadol Itching   Family History  Problem Relation Age of  Onset  . Hypertension Mother   . Diabetes Mother   . Cancer Mother        in leg  . Varicose Veins Maternal Uncle   . Colon cancer Neg Hx     Current Outpatient Medications (Endocrine & Metabolic):  .  estradiol (ESTRACE) 0.5 MG tablet, Take 0.5 mg by mouth daily.   Current Outpatient Medications (Cardiovascular):  .  spironolactone (ALDACTONE) 25 MG tablet, TAKE 1 TABLET BY MOUTH TWICE DAILY  Current Outpatient Medications (Respiratory):  .  fluticasone (FLONASE) 50 MCG/ACT nasal spray, SHAKE LIQUID AND USE 2 SPRAYS IN EACH NOSTRIL DAILY (Patient taking differently: Place 1 spray into both nostrils daily as needed for allergies.  )  Current Outpatient Medications (Analgesics):  .  aspirin 81 MG tablet, Take 81 mg by mouth daily.   .  meloxicam (MOBIC) 15 MG tablet, Take 1 tablet (15 mg total) by mouth daily.   Current Outpatient Medications (Other):  .  calcium-vitamin D (OSCAL WITH D) 500-200 MG-UNIT per tablet, Take 1 tablet by mouth daily with breakfast.  .  cyclobenzaprine (FLEXERIL) 5 MG tablet, Take 1 tablet (5 mg total) by mouth 3 (three) times daily as needed for muscle spasms. .  diclofenac sodium (VOLTAREN) 1 % GEL, Apply 2 g topically 4 (four) times daily. .  famotidine (PEPCID) 20 MG tablet, Take 1 tablet (20 mg total) by mouth daily. Marland Kitchen.  gabapentin (NEURONTIN) 100 MG capsule, Take 2 capsules (200 mg total) by mouth at bedtime. Marland Kitchen.  latanoprost (XALATAN) 0.005 % ophthalmic solution, Place 1 drop into both eyes at bedtime. Marland Kitchen.  omeprazole (PRILOSEC) 40 MG capsule, TAKE 1 CAPSULE BY MOUTH TWICE DAILY 30 MINUTES BEFORE FIRST AND LAST MEAL OF THE DAY .  ondansetron (ZOFRAN) 4 MG tablet, Take 4 mg by mouth every 8 (eight) hours as needed for nausea or vomiting (Take 1 tablet by mouth every eight hours as needed for nausea). .  potassium chloride SA (K-DUR,KLOR-CON) 20 MEQ tablet, TAKE 1 TABLET(20 MEQ) BY MOUTH DAILY .  triamcinolone cream (KENALOG) 0.1 %, Apply 1 application topically 2 (two) times daily.    Past medical history, social, surgical and family history all reviewed in electronic medical record.  No pertanent information unless stated regarding to the chief complaint.   Review of Systems:  No headache, visual changes, nausea, vomiting, diarrhea, constipation, dizziness, abdominal pain, skin rash, fevers, chills, night sweats, weight loss, swollen lymph nodes,  chest pain, shortness of breath, mood changes.  Positive muscle aches, body aches, joint swelling  Objective  Blood pressure 122/82, pulse 68, height 5\' 1"  (1.549 m), weight 197 lb (89.4 kg), SpO2 97 %.    General: No apparent distress alert  and oriented x3 mood and affect normal, dressed appropriately.  HEENT: Pupils equal, extraocular movements intact  Respiratory: Patient's speak in full sentences and does not appear short of breath  Cardiovascular: Trace lower extremity edema, non tender, no erythema  Skin: Warm dry intact with no signs of infection or rash on extremities or on axial skeleton.  Abdomen: Soft nontender  Neuro: Cranial nerves II through XII are intact, neurovascularly intact in all extremities with 2+ DTRs and 2+ pulses.  Lymph: No lymphadenopathy of posterior or anterior cervical chain or axillae bilaterally.  Gait severe antalgic gait MSK: Moderate to severe arthritic changes of multiple joints.  Back exam does have degenerative scoliosis with loss of lordosis.  Significant tightness.  Patient unable to do [facility] secondary to tightness.  Severe pain  over the right sacroiliac joint.  Mild positive straight leg test on the right and 4-5 strength of the lower extremities bilaterally  Knee: Bilateral valgus deformity noted. Large thigh to calf ratio.  Tender to palpation over medial and PF joint line.  ROM full in flexion and extension and lower leg rotation. instability with valgus force.  painful patellar compression. Patellar glide with moderate crepitus. Patellar and quadriceps tendons unremarkable. Hamstring and quadriceps strength is normal.     Impression and Recommendations:     This case required medical decision making of moderate complexity. The above documentation has been reviewed and is accurate and complete Judi Saa, DO       Note: This dictation was prepared with Dragon dictation along with smaller phrase technology. Any transcriptional errors that result from this process are unintentional.

## 2018-08-08 ENCOUNTER — Encounter: Payer: Self-pay | Admitting: Family Medicine

## 2018-08-08 ENCOUNTER — Ambulatory Visit (INDEPENDENT_AMBULATORY_CARE_PROVIDER_SITE_OTHER): Payer: BC Managed Care – PPO | Admitting: Family Medicine

## 2018-08-08 VITALS — BP 122/82 | HR 68 | Ht 61.0 in | Wt 197.0 lb

## 2018-08-08 DIAGNOSIS — M5136 Other intervertebral disc degeneration, lumbar region: Secondary | ICD-10-CM | POA: Diagnosis not present

## 2018-08-08 MED ORDER — KETOROLAC TROMETHAMINE 60 MG/2ML IM SOLN
60.0000 mg | Freq: Once | INTRAMUSCULAR | Status: AC
  Administered 2018-08-08: 60 mg via INTRAMUSCULAR

## 2018-08-08 MED ORDER — METHYLPREDNISOLONE ACETATE 80 MG/ML IJ SUSP
80.0000 mg | Freq: Once | INTRAMUSCULAR | Status: AC
  Administered 2018-08-08: 80 mg via INTRAMUSCULAR

## 2018-08-08 NOTE — Assessment & Plan Note (Signed)
Degenerative disc disease.  X-rays have shown severe arthritis.  Discussed the possibility of formal physical therapy which patient declined.  Given shot of Toradol and Depo-Medrol today.  Need to discuss the possibility of advanced imaging if this continues.  Hold out of work for 1 week.  Follow-up again in 2 weeks.

## 2018-08-08 NOTE — Patient Instructions (Addendum)
Good to see you  Ice is your friend Stay active 2 injection in your back side Hold the meloxicam today but can restart tomorrow.  Gabapentin 200mg  at night See me again in 2 weeks to make sure gettiong better or will need MRI

## 2018-08-15 ENCOUNTER — Telehealth: Payer: Self-pay

## 2018-08-15 NOTE — Telephone Encounter (Signed)
Copied from CRM [id number]. Topic: General - Other >> Aug 15, 2018 11:08 AM Wyonia Hough E wrote: Reason for CRM: Pt called to let Dr. Katrinka Blazing Nurse know she is still experiencing problems with her back. Pt was suppose to go back to work today but when she sits down she is not able to sit down for a long period of time. She wants to speak with nurse to see what she will advise.

## 2018-08-15 NOTE — Telephone Encounter (Signed)
Spoke with patient and faxed letter to work.

## 2018-08-23 ENCOUNTER — Ambulatory Visit (INDEPENDENT_AMBULATORY_CARE_PROVIDER_SITE_OTHER): Payer: BC Managed Care – PPO | Admitting: Family Medicine

## 2018-08-23 ENCOUNTER — Telehealth: Payer: Self-pay | Admitting: Internal Medicine

## 2018-08-23 ENCOUNTER — Encounter: Payer: Self-pay | Admitting: Family Medicine

## 2018-08-23 ENCOUNTER — Other Ambulatory Visit: Payer: Self-pay

## 2018-08-23 DIAGNOSIS — M170 Bilateral primary osteoarthritis of knee: Secondary | ICD-10-CM | POA: Diagnosis not present

## 2018-08-23 DIAGNOSIS — M5136 Other intervertebral disc degeneration, lumbar region: Secondary | ICD-10-CM | POA: Diagnosis not present

## 2018-08-23 NOTE — Assessment & Plan Note (Signed)
Significant improvement in symptoms patient has improved.  Discussed with patient about posture and ergonomics.  We discussed core strengthening.  I believe the patient will do well and has muscle relaxers for breakthrough pain as well as an oral anti-inflammatory as needed.  Patient did have some difficulty with the gabapentin.  Follow-up with me again in 8 weeks

## 2018-08-23 NOTE — Progress Notes (Signed)
[name] [address] [place], [place] [address] Phone: [phone] Subjective:   I Catherine Hoffman am serving as a Neurosurgeon for Dr. Antoine Primas.  CC: Low back pain follow-up, knee pain   POI:[id number]  Catherine Hoffman is a 78 y.o. female coming in with complaint of back pain. States that she is feeling better. Back gets stiff sometimes. States that she has fluid in her knee.  Onset- Chronic knee pain  Character-dull, throbbing aching pain. Severity- 7/10     Past Medical History:  Diagnosis Date  . Arthritis   . Episodic tension type headache   . Esophageal reflux   . GERD (gastroesophageal reflux disease)   . Hyperlipidemia   . Hypertension   . Internal hemorrhoids   . Kidney stone on left side Hx-date unknown  . Left rotator cuff tear    Past Surgical History:  Procedure Laterality Date  . COLONOSCOPY  11/20/2008  . KNEE ARTHROSCOPY Left 2004  . SHOULDER OPEN ROTATOR CUFF REPAIR Left 08/30/2012   Procedure: ROTATOR CUFF REPAIR SHOULDER OPEN, POSSIBLE GRAFT AND ANCHORS;  Surgeon: Jacki Cones, MD;  Location: [facility];  Service: Orthopedics;  Laterality: Left;  . TOTAL ABDOMINAL HYSTERECTOMY W/ BILATERAL SALPINGOOPHORECTOMY  1990's   Social History   Socioeconomic History  . Marital status: Widowed    Spouse name: Not on file  . Number of children: 2  . Years of education: Not on file  . Highest education level: Not on file  Occupational History  . Occupation: Lexicographer  Social Needs  . Financial resource strain: Not hard at all  . Food insecurity:    Worry: Never true    Inability: Never true  . Transportation needs:    Medical: No    Non-medical: No  Tobacco Use  . Smoking status: Never Smoker  . Smokeless tobacco: Never Used  Substance and Sexual Activity  . Alcohol use: No  . Drug use: No  . Sexual activity: Never  Lifestyle  . Physical activity:    Days per week: 0 days    Minutes per  session: 0 min  . Stress: Not at all  Relationships  . Social connections:    Talks on phone: More than three times a week    Gets together: More than three times a week    Attends religious service: More than 4 times per year    Active member of club or organization: Not on file    Attends meetings of clubs or organizations: More than 4 times per year    Relationship status: Widowed  Other Topics Concern  . Not on file  Social History Narrative  . Not on file   Allergies  Allergen Reactions  . Hydrocodone-Acetaminophen     REACTION: GI upset  . Tramadol Itching   Family History  Problem Relation Age of Onset  . Hypertension Mother   . Diabetes Mother   . Cancer Mother        in leg  . Varicose Veins Maternal Uncle   . Colon cancer Neg Hx     Current Outpatient Medications (Endocrine & Metabolic):  .  estradiol (ESTRACE) 0.5 MG tablet, Take 0.5 mg by mouth daily.   Current Outpatient Medications (Cardiovascular):  .  spironolactone (ALDACTONE) 25 MG tablet, TAKE 1 TABLET BY MOUTH TWICE DAILY  Current Outpatient Medications (Respiratory):  .  fluticasone (FLONASE) 50 MCG/ACT nasal spray, SHAKE LIQUID AND USE 2 SPRAYS IN [facility]  NOSTRIL DAILY (Patient taking differently: Place 1 spray into both nostrils daily as needed for allergies. )  Current Outpatient Medications (Analgesics):  .  aspirin 81 MG tablet, Take 81 mg by mouth daily.   .  meloxicam (MOBIC) 15 MG tablet, Take 1 tablet (15 mg total) by mouth daily.   Current Outpatient Medications (Other):  .  calcium-vitamin D (OSCAL WITH D) 500-200 MG-UNIT per tablet, Take 1 tablet by mouth daily with breakfast.  .  cyclobenzaprine (FLEXERIL) 5 MG tablet, Take 1 tablet (5 mg total) by mouth 3 (three) times daily as needed for muscle spasms. .  diclofenac sodium (VOLTAREN) 1 % GEL, Apply 2 g topically 4 (four) times daily. .  famotidine (PEPCID) 20 MG tablet, Take 1 tablet (20 mg total) by mouth daily. Marland Kitchen  gabapentin  (NEURONTIN) 100 MG capsule, Take 2 capsules (200 mg total) by mouth at bedtime. Marland Kitchen  latanoprost (XALATAN) 0.005 % ophthalmic solution, Place 1 drop into both eyes at bedtime. Marland Kitchen  omeprazole (PRILOSEC) 40 MG capsule, TAKE 1 CAPSULE BY MOUTH TWICE DAILY 30 MINUTES BEFORE FIRST AND LAST MEAL OF THE DAY .  ondansetron (ZOFRAN) 4 MG tablet, Take 4 mg by mouth every 8 (eight) hours as needed for nausea or vomiting (Take 1 tablet by mouth every eight hours as needed for nausea). .  potassium chloride SA (K-DUR,KLOR-CON) 20 MEQ tablet, TAKE 1 TABLET(20 MEQ) BY MOUTH DAILY .  triamcinolone cream (KENALOG) 0.1 %, Apply 1 application topically 2 (two) times daily.    Past medical history, social, surgical and family history all reviewed in electronic medical record.  No pertanent information unless stated regarding to the chief complaint.   Review of Systems:  No headache, visual changes, nausea, vomiting, diarrhea, constipation, dizziness, abdominal pain, skin rash, fevers, chills, night sweats, weight loss, swollen lymph nodes, body aches, joint swelling,  chest pain, shortness of breath, mood changes.   Objective  Blood pressure 130/82, pulse 61, height 5\' 1"  (1.549 m), weight 189 lb (85.7 kg), SpO2 98 %.    General: No apparent distress alert and oriented x3 mood and affect normal, dressed appropriately.  HEENT: Pupils equal, extraocular movements intact  Respiratory: Patient's speak in full sentences and does not appear short of breath  Cardiovascular: 2+lower extremity edema, non tender, no erythema  Skin: Warm dry intact with no signs of infection or rash on extremities or on axial skeleton.  Abdomen: Soft nontender  Neuro: Cranial nerves II through XII are intact, neurovascularly intact in all extremities with 2+ DTRs and 2+ pulses.  Lymph: No lymphadenopathy of posterior or anterior cervical chain or axillae bilaterally.  Gait antalgic MSK:  tender with limited range of motion and stability  and symmetric strength and tone of shoulders, elbows, wrist, hip, and ankles bilaterally.  Knee: Right valgus deformity noted. Large thigh to calf ratio.  Tender to palpation over medial and PF joint line.  ROM full in flexion and extension and lower leg rotation. instability with valgus force.  painful patellar compression. Patellar glide with moderate crepitus. Patellar and quadriceps tendons unremarkable. Hamstring and quadriceps strength is normal. Contralateral knee shows minimal tenderness  Loss of Lordosis, poor core strength, tenderness to palpation the paraspinal musculature but improved from previous exam.  Tightness with straight leg test with some mild improvement in range of motion.  Neurovascular intact distally.  After informed written and verbal consent, patient was seated on exam table. Right knee was prepped with alcohol swab and utilizing anterolateral approach, patient's  right knee space was injected with 4:1  marcaine 0.5%: Kenalog 40mg /dL. Patient tolerated the procedure well without immediate complications.   Impression and Recommendations:     This case required medical decision making of moderate complexity. The above documentation has been reviewed and is accurate and complete Lyndal Pulley, DO       Note: This dictation was prepared with Dragon dictation along with smaller phrase technology. Any transcriptional errors that result from this process are unintentional.

## 2018-08-23 NOTE — Patient Instructions (Signed)
Good to see you  Catherine Hoffman is your friend Stay active.zpen Keep doing what you are doing  See me again in 6-8 weeks

## 2018-08-23 NOTE — Assessment & Plan Note (Signed)
Arthritic changes of the knees bilaterally.  Patient is only symptomatic on the right knee.  Injection given today.  Continue candidate for Visco supplementation.  Discussed icing regimen and home exercise.  Discussed which activities to do which wants to avoid.  Topical anti-inflammatories as needed.  Follow-up with me again in 8 weeks.

## 2018-08-23 NOTE — Telephone Encounter (Signed)
Copied from CRM [id number]. Topic: Quick Communication - See Telephone Encounter >> Aug 23, 2018 11:07 AM Fanny Bien wrote: CRM for notification. See Telephone encounter for: 08/23/18. Pt called and stated that her supervisor is requesting a note that states that it is okay to return back to work. Please advise.

## 2018-08-24 NOTE — Telephone Encounter (Signed)
Discussed with patient at this time.  Patient continues to have intermittent pain in the back.  Patient's FMLA paperwork was filled out today and given a 10 pound lifting limit for over the course of the next 6 weeks.  I think that this will be more beneficial for her.  Patient is older and I would like to bring 30-hour max work week.  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  Follow-up again as scheduled

## 2018-09-02 NOTE — Telephone Encounter (Signed)
Pt called In to speak with Dr. Katrinka Blazing assistant. Pt would like to make Dr Katrinka Blazing aware that she received a letter from her job stating that if she doesn't return to work and is able to complete her job duties that pt would be terminated.   Pt says that she would like to return back to work with no restrictions. Pt says that she feels fine and is feeling back to herself.  Pt would like further assistance with a letter stating that she is okay to rtn w/o restrictions.   Please assist.

## 2018-09-05 ENCOUNTER — Telehealth: Payer: Self-pay | Admitting: Pediatrics

## 2018-09-05 NOTE — Telephone Encounter (Signed)
Called patient to let her know that work note will be up front. Could not get in contact and could not leave message.

## 2018-09-05 NOTE — Telephone Encounter (Signed)
Copied from CRM [id number]. Topic: General - Inquiry >> Sep 05, 2018  9:57 AM Maia Petties wrote: Reason for CRM: Pt states she is needing a note that she can return to work w/o limitations (regarding her knee). If she is not able to go back they will terminate employment. Pt requesting note today. Please advise on letter.  Call back cell # [phone]

## 2018-09-06 NOTE — Telephone Encounter (Signed)
Pt made aware not is ready to be picked up at front desk.

## 2018-09-06 NOTE — Telephone Encounter (Signed)
Patient is calling back - states she needs this note asap. Please advise.

## 2018-10-03 NOTE — Progress Notes (Signed)
[facility] [address] [place], [place] [address] Phone: [phone] Subjective:   I Catherine Hoffman am serving as a Neurosurgeon for Dr. Antoine Primas.   CC: Bilateral knee pain and back pain follow-up  [id number]   08/23/2018 Arthritic changes of the knees bilaterally.  Patient is only symptomatic on the right knee.  Injection given today.  Continue candidate for Visco supplementation.  Discussed icing regimen and home exercise.  Discussed which activities to do which wants to avoid.  Topical anti-inflammatories as needed.  Follow-up with me again in 8 weeks.  Significant improvement in symptoms patient has improved.  Discussed with patient about posture and ergonomics.  We discussed core strengthening.  I believe the patient will do well and has muscle relaxers for breakthrough pain as well as an oral anti-inflammatory as needed.  Patient did have some difficulty with the gabapentin.  Follow-up with me again in 8 weeks  10/04/2018 Catherine Hoffman is a 78 y.o. female coming in with complaint of bilateral knee pain. States that her knees feel good as long as the weather is good.  Patient states that she has been feeling significantly better at this time.  80 to 90% better.  States that there is never a time when her knees seem to stop her from any activities.  Patient has been trying to be more active as well.  Been doing some gardening as well.     Past Medical History:  Diagnosis Date  . Arthritis   . Episodic tension type headache   . Esophageal reflux   . GERD (gastroesophageal reflux disease)   . Hyperlipidemia   . Hypertension   . Internal hemorrhoids   . Kidney stone on left side Hx-date unknown  . Left rotator cuff tear    Past Surgical History:  Procedure Laterality Date  . COLONOSCOPY  11/20/2008  . KNEE ARTHROSCOPY Left 2004  . SHOULDER OPEN ROTATOR CUFF REPAIR Left 08/30/2012   Procedure: ROTATOR CUFF REPAIR SHOULDER OPEN, POSSIBLE GRAFT AND  ANCHORS;  Surgeon: Jacki Cones, MD;  Location: [facility];  Service: Orthopedics;  Laterality: Left;  . TOTAL ABDOMINAL HYSTERECTOMY W/ BILATERAL SALPINGOOPHORECTOMY  1990's   Social History   Socioeconomic History  . Marital status: Widowed    Spouse name: Not on file  . Number of children: 2  . Years of education: Not on file  . Highest education level: Not on file  Occupational History  . Occupation: Lexicographer  Social Needs  . Financial resource strain: Not hard at all  . Food insecurity:    Worry: Never true    Inability: Never true  . Transportation needs:    Medical: No    Non-medical: No  Tobacco Use  . Smoking status: Never Smoker  . Smokeless tobacco: Never Used  Substance and Sexual Activity  . Alcohol use: No  . Drug use: No  . Sexual activity: Never  Lifestyle  . Physical activity:    Days per week: 0 days    Minutes per session: 0 min  . Stress: Not at all  Relationships  . Social connections:    Talks on phone: More than three times a week    Gets together: More than three times a week    Attends religious service: More than 4 times per year    Active member of club or organization: Not on file    Attends meetings of clubs or organizations: More than 4 times  per year    Relationship status: Widowed  Other Topics Concern  . Not on file  Social History Narrative  . Not on file   Allergies  Allergen Reactions  . Hydrocodone-Acetaminophen     REACTION: GI upset  . Tramadol Itching   Family History  Problem Relation Age of Onset  . Hypertension Mother   . Diabetes Mother   . Cancer Mother        in leg  . Varicose Veins Maternal Uncle   . Colon cancer Neg Hx     Current Outpatient Medications (Endocrine & Metabolic):  .  estradiol (ESTRACE) 0.5 MG tablet, Take 0.5 mg by mouth daily.   Current Outpatient Medications (Cardiovascular):  .  spironolactone (ALDACTONE) 25 MG tablet, TAKE 1 TABLET BY MOUTH TWICE DAILY   Current Outpatient Medications (Respiratory):  .  fluticasone (FLONASE) 50 MCG/ACT nasal spray, SHAKE LIQUID AND USE 2 SPRAYS IN EACH NOSTRIL DAILY (Patient taking differently: Place 1 spray into both nostrils daily as needed for allergies. )  Current Outpatient Medications (Analgesics):  .  aspirin 81 MG tablet, Take 81 mg by mouth daily.   .  meloxicam (MOBIC) 15 MG tablet, Take 1 tablet (15 mg total) by mouth daily.   Current Outpatient Medications (Other):  .  calcium-vitamin D (OSCAL WITH D) 500-200 MG-UNIT per tablet, Take 1 tablet by mouth daily with breakfast.  .  cyclobenzaprine (FLEXERIL) 5 MG tablet, Take 1 tablet (5 mg total) by mouth 3 (three) times daily as needed for muscle spasms. .  diclofenac sodium (VOLTAREN) 1 % GEL, Apply 2 g topically 4 (four) times daily. .  famotidine (PEPCID) 20 MG tablet, Take 1 tablet (20 mg total) by mouth daily. Marland Kitchen.  gabapentin (NEURONTIN) 100 MG capsule, Take 2 capsules (200 mg total) by mouth at bedtime. Marland Kitchen.  latanoprost (XALATAN) 0.005 % ophthalmic solution, Place 1 drop into both eyes at bedtime. Marland Kitchen.  omeprazole (PRILOSEC) 40 MG capsule, TAKE 1 CAPSULE BY MOUTH TWICE DAILY 30 MINUTES BEFORE FIRST AND LAST MEAL OF THE DAY .  ondansetron (ZOFRAN) 4 MG tablet, Take 4 mg by mouth every 8 (eight) hours as needed for nausea or vomiting (Take 1 tablet by mouth every eight hours as needed for nausea). .  potassium chloride SA (K-DUR,KLOR-CON) 20 MEQ tablet, TAKE 1 TABLET(20 MEQ) BY MOUTH DAILY .  triamcinolone cream (KENALOG) 0.1 %, Apply 1 application topically 2 (two) times daily.    Past medical history, social, surgical and family history all reviewed in electronic medical record.  No pertanent information unless stated regarding to the chief complaint.   Review of Systems:  No headache, visual changes, nausea, vomiting, diarrhea, constipation, dizziness, abdominal pain, skin rash, fevers, chills, night sweats, weight loss, swollen lymph nodes, body  aches, joint swelling, muscle aches, chest pain, shortness of breath, mood changes.   Objective  Blood pressure 140/84, pulse 63, height 5\' 1"  (1.549 m), weight 189 lb (85.7 kg), SpO2 96 %.    General: No apparent distress alert and oriented x3 mood and affect normal, dressed appropriately.  HEENT: Pupils equal, extraocular movements intact  Respiratory: Patient's speak in full sentences and does not appear short of breath  Cardiovascular: No lower extremity edema, non tender, no erythema  Skin: Warm dry intact with no signs of infection or rash on extremities or on axial skeleton.  Abdomen: Soft nontender  Neuro: Cranial nerves II through XII are intact, neurovascularly intact in all extremities with 2+ DTRs and 2+ pulses.  Lymph: No lymphadenopathy of posterior or anterior cervical chain or axillae bilaterally.  Gait mild antalgic MSK:  tender with full range of motion and good stability and symmetric strength and tone of shoulders, elbows, wrist, hip, and ankles bilaterally.  Mild arthritic changes of multiple joints Knee: Bilateral valgus deformity noted. Large thigh to calf ratio.  Normal tenderness noted ROM full in flexion and extension and lower leg rotation. instability with valgus force.  painful patellar compression. Patellar glide with moderate crepitus. Patellar and quadriceps tendons unremarkable. Hamstring and quadriceps strength is normal.  Back Exam:  Inspection: Mild loss of lordosis with degenerative scoliosis Motion: Flexion 35 deg, Extension 20 deg, Side Bending to 35 deg bilaterally, Rotation to 30 deg bilaterally  SLR laying: Negative  XSLR laying: Negative  Palpable tenderness: Minimal tenderness to palpation of the paraspinal musculature lumbar spine. FABER: negative. Sensory change: Gross sensation intact to all lumbar and sacral dermatomes.  Reflexes: 2+ at both patellar tendons, 2+ at achilles tendons, Babinski's downgoing.  Strength at foot   Plantar-flexion: 5/5 Dorsi-flexion: 5/5 Eversion: 5/5 Inversion: 5/5  Leg strength  Quad: 5/5 Hamstring: 5/5 Hip flexor: 5/5 Hip abductors: 5/5     Impression and Recommendations:     . The above documentation has been reviewed and is accurate and complete Judi Saa, DO       Note: This dictation was prepared with Dragon dictation along with smaller phrase technology. Any transcriptional errors that result from this process are unintentional.

## 2018-10-04 ENCOUNTER — Other Ambulatory Visit: Payer: Self-pay

## 2018-10-04 ENCOUNTER — Encounter: Payer: Self-pay | Admitting: Family Medicine

## 2018-10-04 ENCOUNTER — Ambulatory Visit (INDEPENDENT_AMBULATORY_CARE_PROVIDER_SITE_OTHER): Payer: BC Managed Care – PPO | Admitting: Family Medicine

## 2018-10-04 DIAGNOSIS — M170 Bilateral primary osteoarthritis of knee: Secondary | ICD-10-CM

## 2018-10-04 DIAGNOSIS — M5136 Other intervertebral disc degeneration, lumbar region: Secondary | ICD-10-CM | POA: Diagnosis not present

## 2018-10-04 NOTE — Assessment & Plan Note (Signed)
Stable at the moment.  No significant change in management.  Has responded well to the topical anti-inflammatory.  Do not have any samples at this time but will call her if we do get him.  We discussed with her to stay safe during the coronavirus outbreak and not make a follow-up at this time.  Patient will follow-up on an as-needed basis.

## 2018-10-04 NOTE — Assessment & Plan Note (Signed)
Stable.  Continue to monitor.  Follow-up again as needed

## 2018-11-25 ENCOUNTER — Ambulatory Visit: Payer: BC Managed Care – PPO | Admitting: *Deleted

## 2018-11-25 ENCOUNTER — Other Ambulatory Visit: Payer: Self-pay

## 2018-11-25 VITALS — Ht 61.0 in | Wt 185.0 lb

## 2018-11-25 DIAGNOSIS — Z1211 Encounter for screening for malignant neoplasm of colon: Secondary | ICD-10-CM

## 2018-11-25 MED ORDER — SUPREP BOWEL PREP KIT 17.5-3.13-1.6 GM/177ML PO SOLN: 1.0000 | Freq: Once | ORAL | 0 refills | Status: AC

## 2018-11-25 NOTE — Progress Notes (Signed)
No egg or soy allergy known to patient  No issues with past sedation with any surgeries  or procedures, no intubation problems  No diet pills per patient No home 02 use per patient  No blood thinners per patient  Pt denies issues with constipation  No A fib or A flutter  EMMI video sent to pt's e mail   Pt verified name, DOB, address and insurance during PV today. Pt mailed instruction packet to included paper to complete and mail back to [facility] with addressed and stamped envelope, Emmi video, copy of consent form to read and not return, and instructions. Suprep $15  coupon mailed in packet. PV completed over the phone. Pt encouraged to call with questions or issues   Pt is aware that care partner will wait in the car during parking lot; if they feel like they will be too hot to wait in the car; they may wait in the lobby.  We want them to wear a mask (we do not have any that we can provide them), practice social distancing, and we will check their temperatures when they get here.  I did remind patient that their care partner needs to stay in the parking lot the entire time. Pt will wear mask into building  

## 2018-12-01 DIAGNOSIS — H401221 Low-tension glaucoma, left eye, mild stage: Secondary | ICD-10-CM | POA: Diagnosis not present

## 2018-12-01 DIAGNOSIS — H401213 Low-tension glaucoma, right eye, severe stage: Secondary | ICD-10-CM | POA: Diagnosis not present

## 2018-12-01 DIAGNOSIS — Z961 Presence of intraocular lens: Secondary | ICD-10-CM | POA: Diagnosis not present

## 2018-12-02 ENCOUNTER — Encounter: Payer: Self-pay | Admitting: Gastroenterology

## 2018-12-02 ENCOUNTER — Telehealth: Payer: Self-pay | Admitting: Gastroenterology

## 2018-12-02 NOTE — Telephone Encounter (Signed)
Called patient, no answer. Left message to let her know that I will update her instructions for colon on Aug.3 and send new prep instructions in the mail. Patient to call us back if any further questions.

## 2018-12-02 NOTE — Telephone Encounter (Signed)
Called patient. No answer, left message for her to call me back.

## 2018-12-02 NOTE — Telephone Encounter (Signed)
Pt had to reschedule colonoscopy to August 3 at 8:30 and would like to discuss prep instructions.

## 2018-12-07 ENCOUNTER — Encounter: Payer: BC Managed Care – PPO | Admitting: Gastroenterology

## 2018-12-12 ENCOUNTER — Other Ambulatory Visit: Payer: Self-pay | Admitting: Internal Medicine

## 2019-01-06 ENCOUNTER — Telehealth: Payer: Self-pay | Admitting: Gastroenterology

## 2019-01-06 NOTE — Telephone Encounter (Signed)
Left message for patient regarding Covid-19 screening questions. °Covid-19 Screening Questions: °  °Do you now or have you had a fever in the last 14 days?  °  °Do you have any respiratory symptoms of shortness of breath or cough now or in the last 14 days?  °  °Do you have any family members or close contacts with diagnosed or suspected Covid-19 in the past 14 days?  °  °Have you been tested for Covid-19 and found to be positive?  °  ° °

## 2019-01-09 ENCOUNTER — Encounter: Payer: Self-pay | Admitting: Gastroenterology

## 2019-01-09 ENCOUNTER — Ambulatory Visit (AMBULATORY_SURGERY_CENTER): Payer: BC Managed Care – PPO | Admitting: Gastroenterology

## 2019-01-09 ENCOUNTER — Other Ambulatory Visit: Payer: Self-pay

## 2019-01-09 VITALS — BP 126/66 | HR 50 | Temp 97.7°F | Resp 15 | Ht 61.0 in | Wt 185.0 lb

## 2019-01-09 DIAGNOSIS — Z1211 Encounter for screening for malignant neoplasm of colon: Secondary | ICD-10-CM | POA: Diagnosis not present

## 2019-01-09 DIAGNOSIS — E7800 Pure hypercholesterolemia, unspecified: Secondary | ICD-10-CM | POA: Diagnosis not present

## 2019-01-09 DIAGNOSIS — I10 Essential (primary) hypertension: Secondary | ICD-10-CM | POA: Diagnosis not present

## 2019-01-09 DIAGNOSIS — Z86010 Personal history of colonic polyps: Secondary | ICD-10-CM | POA: Diagnosis not present

## 2019-01-09 MED ORDER — SODIUM CHLORIDE 0.9 % IV SOLN: 500.0000 mL | Freq: Once | INTRAVENOUS | Status: DC

## 2019-01-09 NOTE — Op Note (Signed)
[facility] Patient Name: Catherine EvertsLuebelle Erich Procedure Date: 01/09/2019 8:39 AM MRN: [id number] Endoscopist: Meryl DareMalcolm T Stark , MD Age: [age] Referring MD:  Date of Birth: 1941/04/04 Gender: Female Account #: [id number] Procedure:                Colonoscopy Indications:              Screening for colorectal malignant neoplasm Medicines:                Monitored Anesthesia Care Procedure:                Pre-Anesthesia Assessment:                           - Prior to the procedure, a History and Physical                            was performed, and patient medications and                            allergies were reviewed. The patient's tolerance of                            previous anesthesia was also reviewed. The risks                            and benefits of the procedure and the sedation                            options and risks were discussed with the patient.                            All questions were answered, and informed consent                            was obtained. Prior Anticoagulants: The patient has                            taken no previous anticoagulant or antiplatelet                            agents. ASA Grade Assessment: II - A patient with                            mild systemic disease. After reviewing the risks                            and benefits, the patient was deemed in                            satisfactory condition to undergo the procedure.                           After obtaining informed consent, the colonoscope  was passed under direct vision. Throughout the                            procedure, the patient's blood pressure, pulse, and                            oxygen saturations were monitored continuously. The                            Colonoscope was introduced through the anus and                            advanced to the the cecum, identified by                            appendiceal orifice and  ileocecal valve. The                            ileocecal valve, appendiceal orifice, and rectum                            were photographed. The quality of the bowel                            preparation was good. The colonoscopy was performed                            without difficulty. The patient tolerated the                            procedure well. Scope In: 8:40:14 AM Scope Out: 8:57:02 AM Scope Withdrawal Time: 0 hours 11 minutes 45 seconds  Total Procedure Duration: 0 hours 16 minutes 48 seconds  Findings:                 The perianal and digital rectal examinations were                            normal.                           Internal hemorrhoids were found during                            retroflexion. The hemorrhoids were small and Grade                            I (internal hemorrhoids that do not prolapse).                           The exam was otherwise without abnormality on                            direct and retroflexion views. Complications:            No immediate complications. Estimated blood loss:  None. Estimated Blood Loss:     Estimated blood loss: none. Impression:               - Internal hemorrhoids.                           - The examination was otherwise normal on direct                            and retroflexion views.                           - No specimens collected. Recommendation:           - Patient has a contact number available for                            emergencies. The signs and symptoms of potential                            delayed complications were discussed with the                            patient. Return to normal activities tomorrow.                            Written discharge instructions were provided to the                            patient.                           - Resume previous diet.                           - Continue present medications.                           - No  repeat colonoscopy due to age and the absence                            of colonic polyps. Meryl DareMalcolm T Stark, MD 01/09/2019 8:59:38 AM This report has been signed electronically.

## 2019-01-09 NOTE — Progress Notes (Signed)
Report given to PACU, vss 

## 2019-01-09 NOTE — Patient Instructions (Signed)
Information on hemorrhoids given to you today.  YOU HAD AN ENDOSCOPIC PROCEDURE TODAY AT THE [facility]:   Refer to the procedure report that was given to you for any specific questions about what was found during the examination.  If the procedure report does not answer your questions, please call your gastroenterologist to clarify.  If you requested that your care partner not be given the details of your procedure findings, then the procedure report has been included in a sealed envelope for you to review at your convenience later.  YOU SHOULD EXPECT: Some feelings of bloating in the abdomen. Passage of more gas than usual.  Walking can help get rid of the air that was put into your GI tract during the procedure and reduce the bloating. If you had a lower endoscopy (such as a colonoscopy or flexible sigmoidoscopy) you may notice spotting of blood in your stool or on the toilet paper. If you underwent a bowel prep for your procedure, you may not have a normal bowel movement for a few days.  Please Note:  You might notice some irritation and congestion in your nose or some drainage.  This is from the oxygen used during your procedure.  There is no need for concern and it should clear up in a day or so.  SYMPTOMS TO REPORT IMMEDIATELY:   Following lower endoscopy (colonoscopy or flexible sigmoidoscopy):  Excessive amounts of blood in the stool  Significant tenderness or worsening of abdominal pains  Swelling of the abdomen that is new, acute  Fever of 100F or higher   For urgent or emergent issues, a gastroenterologist can be reached at any hour by calling [phone].   DIET:  We do recommend a small meal at first, but then you may proceed to your regular diet.  Drink plenty of fluids but you should avoid alcoholic beverages for 24 hours.  ACTIVITY:  You should plan to take it easy for the rest of today and you should NOT DRIVE or use heavy machinery until tomorrow (because  of the sedation medicines used during the test).    FOLLOW UP: Our staff will call the number listed on your records 48-72 hours following your procedure to check on you and address any questions or concerns that you may have regarding the information given to you following your procedure. If we do not reach you, we will leave a message.  We will attempt to reach you two times.  During this call, we will ask if you have developed any symptoms of COVID 19. If you develop any symptoms (ie: fever, flu-like symptoms, shortness of breath, cough etc.) before then, please call [phone].  If you test positive for Covid 19 in the 2 weeks post procedure, please call and report this information to us.    If any biopsies were taken you will be contacted by phone or by letter within the next 1-3 weeks.  Please call us at [phone] if you have not heard about the biopsies in 3 weeks.    SIGNATURES/CONFIDENTIALITY: You and/or your care partner have signed paperwork which will be entered into your electronic medical record.  These signatures attest to the fact that that the information above on your After Visit Summary has been reviewed and is understood.  Full responsibility of the confidentiality of this discharge information lies with you and/or your care-partner. 

## 2019-01-09 NOTE — Progress Notes (Signed)
Vitals [name]/ temp [name]  Pt's states no medical or surgical changes since previsit or office visit.

## 2019-01-11 ENCOUNTER — Telehealth: Payer: Self-pay

## 2019-01-11 NOTE — Telephone Encounter (Signed)
  Follow up Call-  Call back number 01/09/2019  Post procedure Call Back phone  # [phone]  Permission to leave phone message Yes  Some recent data might be hidden     Patient questions:  Do you have a fever, pain , or abdominal swelling? No. Pain Score  0 *  Have you tolerated food without any problems? Yes.    Have you been able to return to your normal activities? Yes.    Do you have any questions about your discharge instructions: Diet   No. Medications  No. Follow up visit  No.  Do you have questions or concerns about your Care? No.  Actions: * If pain score is 4 or above: No action needed, pain <4. 1. Have you developed a fever since your procedure? no  2.   Have you had an respiratory symptoms (SOB or cough) since your procedure? no  3.   Have you tested positive for COVID 19 since your procedure no  4.   Have you had any family members/close contacts diagnosed with the COVID 19 since your procedure?  no   If yes to any of these questions please route to Joylene John, RN and Alphonsa Gin, Therapist, sports.

## 2019-01-23 ENCOUNTER — Other Ambulatory Visit: Payer: Self-pay | Admitting: Internal Medicine

## 2019-02-01 ENCOUNTER — Ambulatory Visit (INDEPENDENT_AMBULATORY_CARE_PROVIDER_SITE_OTHER): Payer: BC Managed Care – PPO | Admitting: Family

## 2019-02-01 ENCOUNTER — Encounter: Payer: Self-pay | Admitting: Family

## 2019-02-01 DIAGNOSIS — J309 Allergic rhinitis, unspecified: Secondary | ICD-10-CM

## 2019-02-01 MED ORDER — FLUTICASONE PROPIONATE 50 MCG/ACT NA SUSP: 2.0000 | Freq: Every day | NASAL | 3 refills | Status: DC

## 2019-02-01 MED ORDER — CETIRIZINE HCL 10 MG PO TABS: 10.0000 mg | ORAL_TABLET | Freq: Every day | ORAL | 3 refills | Status: DC

## 2019-02-01 NOTE — Progress Notes (Signed)
Catherine Hoffman is a 78 y.o. female with the following history as recorded in Epic[facility]:  Patient Active Problem List   Diagnosis Date Noted  . DDD (degenerative disc disease), lumbar 06/28/2018  . Rash 03/18/2018  . Venous insufficiency 10/21/2016  . Greater trochanteric bursitis of right hip 07/24/2016  . Routine general medical examination at a health care facility 02/27/2016  . Asthma, mild intermittent 07/19/2015  . Polypharmacy 11/24/2014  . Paresthesias 08/18/2014  . Arthritis of knee, degenerative 11/17/2011  . Essential hypertension 09/28/2010  . RENAL CALCULUS 08/08/2010  . Hyperlipidemia 02/25/2009  . Low back pain 10/04/2008  . Chronic allergic rhinitis 09/21/2008  . Obesity 06/30/2007  . ACID REFLUX DISEASE 06/30/2007    Current Outpatient Medications  Medication Sig Dispense Refill  . acetaminophen (TYLENOL) 500 MG tablet Take 500 mg by mouth every 6 (six) hours as needed.    Marland Kitchen. aspirin 81 MG tablet Take 81 mg by mouth daily.      . calcium-vitamin D (OSCAL WITH D) 500-200 MG-UNIT per tablet Take 1 tablet by mouth daily with breakfast.     . cetirizine (ZYRTEC) 10 MG tablet Take 1 tablet (10 mg total) by mouth daily. 30 tablet 3  . Cholecalciferol (VITAMIN D3) 50 MCG (2000 UT) TABS Take by mouth daily.    . cyclobenzaprine (FLEXERIL) 5 MG tablet Take 1 tablet (5 mg total) by mouth 3 (three) times daily as needed for muscle spasms. 60 tablet 0  . Diclofenac Sodium (PENNSAID) 2 % SOLN Place onto the skin.    Marland Kitchen. diclofenac sodium (VOLTAREN) 1 % GEL Apply 2 g topically 4 (four) times daily. 100 g 6  . estradiol (ESTRACE) 0.5 MG tablet Take 0.5 mg by mouth daily.   11  . famotidine (PEPCID) 20 MG tablet Take 1 tablet (20 mg total) by mouth daily. 14 tablet 0  . fluticasone (FLONASE) 50 MCG/ACT nasal spray Place 2 sprays into both nostrils daily. SHAKE LIQUID AND USE 2 SPRAYS IN EACH NOSTRIL DAILY 16 g 3  . gabapentin (NEURONTIN) 100 MG capsule Take 2 capsules (200 mg total)  by mouth at bedtime. (Patient not taking: Reported on 11/25/2018) 60 capsule 3  . HYDROcodone-acetaminophen (NORCO/VICODIN) 5-325 MG tablet Take 1 tablet by mouth every 6 (six) hours as needed for moderate pain.    Marland Kitchen. latanoprost (XALATAN) 0.005 % ophthalmic solution Place 1 drop into both eyes at bedtime.    . meloxicam (MOBIC) 15 MG tablet TAKE 1 TABLET BY MOUTH EVERY DAY 30 tablet 5  . metroNIDAZOLE (FLAGYL) 250 MG tablet Take 250 mg by mouth as needed.    Marland Kitchen. omeprazole (PRILOSEC) 40 MG capsule TAKE 1 CAPSULE BY MOUTH TWICE DAILY 30 MINUTES BEFORE FIRST AND LAST MEAL OF THE DAY 60 capsule 2  . ondansetron (ZOFRAN) 4 MG tablet Take 4 mg by mouth every 8 (eight) hours as needed for nausea or vomiting (Take 1 tablet by mouth every eight hours as needed for nausea).    Marland Kitchen. OVER THE COUNTER MEDICATION [facility] daily for joints    . potassium chloride SA (K-DUR,KLOR-CON) 20 MEQ tablet TAKE 1 TABLET(20 MEQ) BY MOUTH DAILY 90 tablet 3  . prednisoLONE acetate (PRED MILD) 0.12 % ophthalmic suspension 1 drop as needed. Pt uses as needed    . predniSONE (DELTASONE) 10 MG tablet prednisone 10 mg tablet    . spironolactone (ALDACTONE) 25 MG tablet TAKE 1 TABLET BY MOUTH TWICE DAILY 180 tablet 3  . triamcinolone cream (KENALOG) 0.1 %  Apply 1 application topically 2 (two) times daily. 100 g 0  . Turmeric (CURCUMIN 95) 500 MG CAPS Take by mouth.     No current facility-administered medications for this visit.     Allergies: Hydrocodone-acetaminophen and Tramadol  Past Medical History:  Diagnosis Date  . Arthritis   . Cataract    removed both eyes   . Episodic tension type headache   . Esophageal reflux   . GERD (gastroesophageal reflux disease)   . Glaucoma   . Heart murmur    as young adult  . Hyperlipidemia   . Hypertension   . Internal hemorrhoids   . Kidney stone on left side Hx-date unknown  . Left rotator cuff tear     Past Surgical History:  Procedure Laterality Date  . COLONOSCOPY   11/20/2008  . COLONOSCOPY    . KNEE ARTHROSCOPY Left 2004  . SHOULDER OPEN ROTATOR CUFF REPAIR Left 08/30/2012   Procedure: ROTATOR CUFF REPAIR SHOULDER OPEN, POSSIBLE GRAFT AND ANCHORS;  Surgeon: Tobi Bastos, MD;  Location: [facility];  Service: Orthopedics;  Laterality: Left;  . TOTAL ABDOMINAL HYSTERECTOMY W/ BILATERAL SALPINGOOPHORECTOMY  16's    Family History  Problem Relation Age of Onset  . Hypertension Mother   . Diabetes Mother   . Cancer Mother        in leg  . Varicose Veins Maternal Uncle   . Colon cancer Neg Hx   . Colon polyps Neg Hx   . Esophageal cancer Neg Hx   . Stomach cancer Neg Hx   . Rectal cancer Neg Hx     Social History   Tobacco Use  . Smoking status: Never Smoker  . Smokeless tobacco: Never Used  Substance Use Topics  . Alcohol use: No    Subjective:    I connected with Catherine Hoffman on 02/01/19 at  9:40 AM EDT by a telephone call and verified that I am speaking with the correct person using two identifiers.   I discussed the limitations of evaluation and management by telemedicine and the availability of in person appointments. The patient expressed understanding and agreed to proceed.  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      Objective:  There were no vitals filed for this visit.  Lungs: Respirations unlabored; Neurologic: Alert and oriented; speech intact;   Assessment:  1. Chronic allergic rhinitis     Plan:  Refills updated on Zyrtec and Flonase as requested; follow-up if symptoms worse, no better.  Time spent 7 minutes;   No follow-ups on file.  No orders of the defined types were placed in this encounter.   Requested Prescriptions   Signed Prescriptions Disp  Refills  . fluticasone (FLONASE) 50 MCG/ACT nasal spray 16 g 3    Sig: Place 2 sprays into both nostrils daily. SHAKE LIQUID AND USE 2 SPRAYS IN EACH NOSTRIL DAILY  . cetirizine (ZYRTEC) 10 MG tablet 30 tablet 3    Sig: Take 1 tablet (10 mg total) by mouth daily.

## 2019-03-09 ENCOUNTER — Ambulatory Visit (INDEPENDENT_AMBULATORY_CARE_PROVIDER_SITE_OTHER): Payer: BC Managed Care – PPO | Admitting: Family Medicine

## 2019-03-09 ENCOUNTER — Ambulatory Visit (INDEPENDENT_AMBULATORY_CARE_PROVIDER_SITE_OTHER): Payer: BC Managed Care – PPO | Admitting: Internal Medicine

## 2019-03-09 ENCOUNTER — Other Ambulatory Visit: Payer: Self-pay

## 2019-03-09 ENCOUNTER — Encounter: Payer: Self-pay | Admitting: Family Medicine

## 2019-03-09 ENCOUNTER — Other Ambulatory Visit (INDEPENDENT_AMBULATORY_CARE_PROVIDER_SITE_OTHER): Payer: BC Managed Care – PPO

## 2019-03-09 ENCOUNTER — Encounter: Payer: Self-pay | Admitting: Internal Medicine

## 2019-03-09 VITALS — BP 130/82 | HR 75 | Temp 97.5°F | Ht 61.0 in | Wt 197.0 lb

## 2019-03-09 DIAGNOSIS — J309 Allergic rhinitis, unspecified: Secondary | ICD-10-CM

## 2019-03-09 DIAGNOSIS — J4520 Mild intermittent asthma, uncomplicated: Secondary | ICD-10-CM

## 2019-03-09 DIAGNOSIS — Z23 Encounter for immunization: Secondary | ICD-10-CM | POA: Diagnosis not present

## 2019-03-09 DIAGNOSIS — E785 Hyperlipidemia, unspecified: Secondary | ICD-10-CM

## 2019-03-09 DIAGNOSIS — Z0000 Encounter for general adult medical examination without abnormal findings: Secondary | ICD-10-CM | POA: Diagnosis not present

## 2019-03-09 DIAGNOSIS — I10 Essential (primary) hypertension: Secondary | ICD-10-CM

## 2019-03-09 DIAGNOSIS — M170 Bilateral primary osteoarthritis of knee: Secondary | ICD-10-CM

## 2019-03-09 LAB — CBC
HCT: 38.9 % (ref 36.0–46.0)
Hemoglobin: 12.6 g/dL (ref 12.0–15.0)
MCHC: 32.5 g/dL (ref 30.0–36.0)
MCV: 92.6 fl (ref 78.0–100.0)
Platelets: 189 10*3/uL (ref 150.0–400.0)
RBC: 4.2 Mil/uL (ref 3.87–5.11)
RDW: 13.6 % (ref 11.5–15.5)
WBC: 6.4 10*3/uL (ref 4.0–10.5)

## 2019-03-09 LAB — LIPID PANEL
Cholesterol: 213 mg/dL — ABNORMAL HIGH (ref 0–200)
HDL: 54.1 mg/dL (ref >39.00)
LDL Cholesterol: 140 mg/dL — ABNORMAL HIGH (ref 0–99)
NonHDL: 158.4
Total CHOL/HDL Ratio: 4
Triglycerides: 90 mg/dL (ref 0.0–149.0)
VLDL: 18 mg/dL (ref 0.0–40.0)

## 2019-03-09 LAB — COMPREHENSIVE METABOLIC PANEL
ALT: 9 U/L (ref 0–35)
AST: 12 U/L (ref 0–37)
Albumin: 4.2 g/dL (ref 3.5–5.2)
Alkaline Phosphatase: 91 U/L (ref 39–117)
BUN: 23 mg/dL (ref 6–23)
CO2: 22 mEq/L (ref 19–32)
Calcium: 9.5 mg/dL (ref 8.4–10.5)
Chloride: 111 mEq/L (ref 96–112)
Creatinine, Ser: 1.02 mg/dL (ref 0.40–1.20)
GFR: 63.4 mL/min (ref >60.00)
Glucose, Bld: 123 mg/dL — ABNORMAL HIGH (ref 70–99)
Potassium: 3.9 mEq/L (ref 3.5–5.1)
Sodium: 142 mEq/L (ref 135–145)
Total Bilirubin: 0.3 mg/dL (ref 0.2–1.2)
Total Protein: 7 g/dL (ref 6.0–8.3)

## 2019-03-09 NOTE — Assessment & Plan Note (Signed)
Stable prn albuterol only. Takes allergy medicine which helps also.

## 2019-03-09 NOTE — Progress Notes (Signed)
Tawana ScaleZach Jaqlyn Gruenhagen D.O. [facility] [address] [place], [place] [address] Phone: [phone] Subjective:    I'm seeing this patient by the request  of:    CC: Right knee pain  BJY:[id number]:Subjective  Catherine EvertsLuebelle Hoffman is a 78 y.o. female coming in with complaint of right knee pain.  Known arthritic changes of the knee.  Worsening pain recently.  Last injections were in March.  States that her left knee seems to be doing relatively well though.  Mild increasing instability.  Nothing that is stopping her from activity not at this moment.     Past Medical History:  Diagnosis Date   Arthritis    Cataract    removed both eyes    Episodic tension type headache    Esophageal reflux    GERD (gastroesophageal reflux disease)    Glaucoma    Heart murmur    as young adult   Hyperlipidemia    Hypertension    Internal hemorrhoids    Kidney stone on left side Hx-date unknown   Left rotator cuff tear    Past Surgical History:  Procedure Laterality Date   COLONOSCOPY  11/20/2008   COLONOSCOPY     KNEE ARTHROSCOPY Left 2004   SHOULDER OPEN ROTATOR CUFF REPAIR Left 08/30/2012   Procedure: ROTATOR CUFF REPAIR SHOULDER OPEN, POSSIBLE GRAFT AND ANCHORS;  Surgeon: Jacki Conesonald A Gioffre, MD;  [facility];  Service: Orthopedics;  Laterality: Left;   TOTAL ABDOMINAL HYSTERECTOMY W/ BILATERAL SALPINGOOPHORECTOMY  1990's   Social History   Socioeconomic History   Marital status: Widowed    Spouse name: Not on file   Number of children: 2   Years of education: Not on file   Highest education level: Not on file  Occupational History   Occupation: LexicographerBus assistant  Social Needs   Financial resource strain: Not hard at all   Food insecurity    Worry: Never true    Inability: Never true   Transportation needs    Medical: No    Non-medical: No  Tobacco Use   Smoking status: Never Smoker   Smokeless tobacco: Never Used  Substance and Sexual Activity    Alcohol use: No   Drug use: No   Sexual activity: Never  Lifestyle   Physical activity    Days per week: 0 days    Minutes per session: 0 min   Stress: Not at all  Relationships   Social connections    Talks on phone: More than three times a week    Gets together: More than three times a week    Attends religious service: More than 4 times per year    Active member of club or organization: Not on file    Attends meetings of clubs or organizations: More than 4 times per year    Relationship status: Widowed  Other Topics Concern   Not on file  Social History Narrative   Not on file   Allergies  Allergen Reactions   Hydrocodone-Acetaminophen     REACTION: GI upset   Tramadol Itching   Family History  Problem Relation Age of Onset   Hypertension Mother    Diabetes Mother    Cancer Mother        in leg   Varicose Veins Maternal Uncle    Colon cancer Neg Hx    Colon polyps Neg Hx    Esophageal cancer Neg Hx    Stomach cancer Neg Hx    Rectal  cancer Neg Hx     Current Outpatient Medications (Endocrine & Metabolic):    estradiol (ESTRACE) 0.5 MG tablet, Take 0.5 mg by mouth daily.   Current Outpatient Medications (Cardiovascular):    spironolactone (ALDACTONE) 25 MG tablet, TAKE 1 TABLET BY MOUTH TWICE DAILY  Current Outpatient Medications (Respiratory):    cetirizine (ZYRTEC) 10 MG tablet, Take 1 tablet (10 mg total) by mouth daily.   fluticasone (FLONASE) 50 MCG/ACT nasal spray, Place 2 sprays into both nostrils daily. SHAKE LIQUID AND USE 2 SPRAYS IN EACH NOSTRIL DAILY  Current Outpatient Medications (Analgesics):    acetaminophen (TYLENOL) 500 MG tablet, Take 500 mg by mouth every 6 (six) hours as needed.   aspirin 81 MG tablet, Take 81 mg by mouth daily.     meloxicam (MOBIC) 15 MG tablet, TAKE 1 TABLET BY MOUTH EVERY DAY   Current Outpatient Medications (Other):    calcium-vitamin D (OSCAL WITH D) 500-200 MG-UNIT per tablet, Take 1  tablet by mouth daily with breakfast.    Cholecalciferol (VITAMIN D3) 50 MCG (2000 UT) TABS, Take by mouth daily.   cyclobenzaprine (FLEXERIL) 5 MG tablet, Take 1 tablet (5 mg total) by mouth 3 (three) times daily as needed for muscle spasms.   Diclofenac Sodium (PENNSAID) 2 % SOLN, Place onto the skin.   diclofenac sodium (VOLTAREN) 1 % GEL, Apply 2 g topically 4 (four) times daily.   famotidine (PEPCID) 20 MG tablet, Take 1 tablet (20 mg total) by mouth daily.   latanoprost (XALATAN) 0.005 % ophthalmic solution, Place 1 drop into both eyes at bedtime.   metroNIDAZOLE (FLAGYL) 250 MG tablet, Take 250 mg by mouth as needed.   omeprazole (PRILOSEC) 40 MG capsule, TAKE 1 CAPSULE BY MOUTH TWICE DAILY 30 MINUTES BEFORE FIRST AND LAST MEAL OF THE DAY   ondansetron (ZOFRAN) 4 MG tablet, Take 4 mg by mouth every 8 (eight) hours as needed for nausea or vomiting (Take 1 tablet by mouth every eight hours as needed for nausea).   OVER THE COUNTER MEDICATION, [facility] daily for joints   potassium chloride SA (K-DUR,KLOR-CON) 20 MEQ tablet, TAKE 1 TABLET(20 MEQ) BY MOUTH DAILY   prednisoLONE acetate (PRED MILD) 0.12 % ophthalmic suspension, 1 drop as needed. Pt uses as needed   triamcinolone cream (KENALOG) 0.1 %, Apply 1 application topically 2 (two) times daily.   Turmeric (CURCUMIN 95) 500 MG CAPS, Take by mouth.    Past medical history, social, surgical and family history all reviewed in electronic medical record.  No pertanent information unless stated regarding to the chief complaint.   Review of Systems:  No headache, visual changes, nausea, vomiting, diarrhea, constipation, dizziness, abdominal pain, skin rash, fevers, chills, night sweats, weight loss, swollen lymph nodes, body aches, joint swelling,  chest pain, shortness of breath, mood changes.  Positive muscle aches  Objective    General: No apparent distress alert and oriented x3 mood and affect normal, dressed  appropriately.  HEENT: Pupils equal, extraocular movements intact  Respiratory: Patient's speak in full sentences and does not appear short of breath  Cardiovascular: No lower extremity edema, non tender, no erythema  Skin: Warm dry intact with no signs of infection or rash on extremities or on axial skeleton.  Abdomen: Soft nontender  Neuro: Cranial nerves II through XII are intact, neurovascularly intact in all extremities with 2+ DTRs and 2+ pulses.  Lymph: No lymphadenopathy of posterior or anterior cervical chain or axillae bilaterally.  Gait antalgic MSK:  tender  with full range of motion and good stability and symmetric strength and tone of shoulders, elbows, wrist, hip and ankles bilaterally.  Knee: Right valgus deformity noted.  Abnormal thigh to calf ratio.  Tender to palpation over medial and PF joint line.  ROM full in flexion and extension and lower leg rotation. instability with valgus force.  painful patellar compression. Patellar glide with moderate crepitus. Patellar and quadriceps tendons unremarkable. Hamstring and quadriceps strength is normal. Contralateral knee shows moderate arthritic changes of mild instability  After informed written and verbal consent, patient was seated on exam table. Right knee was prepped with alcohol swab and utilizing anterolateral approach, patient's right knee space was injected with 4:1  marcaine 0.5%: Kenalog 40mg /dL. Patient tolerated the procedure well without immediate complications.     Impression and Recommendations:     This case required medical decision making of moderate complexity. The above documentation has been reviewed and is accurate and complete Lyndal Pulley, DO       Note: This dictation was prepared with Dragon dictation along with smaller phrase technology. Any transcriptional errors that result from this process are unintentional.

## 2019-03-09 NOTE — Assessment & Plan Note (Signed)
Flu shot given. Pneumonia complete. Shingrix counseled. Tetanus due 2027. Colonoscopy aged out done August 2020. Mammogram due 2021, pap smear aged out and dexa declines. Counseled about sun safety and mole surveillance. Counseled about the dangers of distracted driving. Given 10 year screening recommendations.

## 2019-03-09 NOTE — Progress Notes (Signed)
Subjective:   Patient ID: Catherine Hoffman, female    DOB: 1940-09-17, 78 y.o.   MRN: [id number]  HPI Here for medicare wellness and physical, no new complaints. Please see A/P for status and treatment of chronic medical problems.   Diet: heart healthy Physical activity: sedentary Depression/mood screen: negative Hearing: intact to whispered voice Visual acuity: grossly normal, performs annual eye exam  ADLs: capable Fall risk: none Home safety: good Cognitive evaluation: intact to orientation, naming, recall and repetition EOL planning: adv directives discussed    Office Visit from 03/09/2019 in [place]  PHQ-2 Total Score  0        Clinical Support from 08/12/2017 in [facility]  PHQ-9 Total Score  2      I have personally reviewed and have noted 1. The patient's medical and social history - reviewed today no changes 2. Their use of alcohol, tobacco or illicit drugs 3. Their current medications and supplements 4. The patient's functional ability including ADL's, fall risks, home safety risks and hearing or visual impairment. 5. Diet and physical activities 6. Evidence for depression or mood disorders 7. Care team reviewed and updated  Patient Care Team: Hoyt Koch, MD as PCP - General (Internal Medicine) Past Medical History:  Diagnosis Date  . Arthritis   . Cataract    removed both eyes   . Episodic tension type headache   . Esophageal reflux   . GERD (gastroesophageal reflux disease)   . Glaucoma   . Heart murmur    as young adult  . Hyperlipidemia   . Hypertension   . Internal hemorrhoids   . Kidney stone on left side Hx-date unknown  . Left rotator cuff tear    Past Surgical History:  Procedure Laterality Date  . COLONOSCOPY  11/20/2008  . COLONOSCOPY    . KNEE ARTHROSCOPY Left 2004  . SHOULDER OPEN ROTATOR CUFF REPAIR Left 08/30/2012   Procedure: ROTATOR CUFF REPAIR SHOULDER OPEN,  POSSIBLE GRAFT AND ANCHORS;  Surgeon: Tobi Bastos, MD;  Location: [place];  Service: Orthopedics;  Laterality: Left;  . TOTAL ABDOMINAL HYSTERECTOMY W/ BILATERAL SALPINGOOPHORECTOMY  72's   Family History  Problem Relation Age of Onset  . Hypertension Mother   . Diabetes Mother   . Cancer Mother        in leg  . Varicose Veins Maternal Uncle   . Colon cancer Neg Hx   . Colon polyps Neg Hx   . Esophageal cancer Neg Hx   . Stomach cancer Neg Hx   . Rectal cancer Neg Hx    Review of Systems  Constitutional: Negative.   HENT: Negative.   Eyes: Negative.   Respiratory: Negative for cough, chest tightness and shortness of breath.   Cardiovascular: Negative for chest pain, palpitations and leg swelling.  Gastrointestinal: Negative for abdominal distention, abdominal pain, constipation, diarrhea, nausea and vomiting.  Musculoskeletal: Positive for arthralgias.  Skin: Negative.   Neurological: Negative.   Psychiatric/Behavioral: Negative.     Objective:  Physical Exam Constitutional:      Appearance: She is well-developed.  HENT:     Head: Normocephalic and atraumatic.  Neck:     Musculoskeletal: Normal range of motion.  Cardiovascular:     Rate and Rhythm: Normal rate and regular rhythm.  Pulmonary:     Effort: Pulmonary effort is normal. No respiratory distress.     Breath sounds: Normal breath sounds. No wheezing or rales.  Abdominal:     General: Bowel sounds are normal. There is no distension.     Palpations: Abdomen is soft.     Tenderness: There is no abdominal tenderness. There is no rebound.  Musculoskeletal:        General: Tenderness present.     Comments: Right knee pain  Skin:    General: Skin is warm and dry.  Neurological:     Mental Status: She is alert and oriented to person, place, and time.     Coordination: Coordination normal.     Vitals:   03/09/19 1431  BP: 130/82  Pulse: 75  Temp: (!) 97.5 F (36.4 C)  TempSrc:  Oral  SpO2: 97%  Weight: 197 lb (89.4 kg)  Height: 5\' 1"  (1.549 m)    Assessment & Plan:  Flu shot given at visit

## 2019-03-09 NOTE — Patient Instructions (Signed)
Health Maintenance, Female Adopting a healthy lifestyle and getting preventive care are important in promoting health and wellness. Ask your health care provider about:  The right schedule for you to have regular tests and exams.  Things you can do on your own to prevent diseases and keep yourself healthy. What should I know about diet, weight, and exercise? Eat a healthy diet   Eat a diet that includes plenty of vegetables, fruits, low-fat dairy products, and lean protein.  Do not eat a lot of foods that are high in solid fats, added sugars, or sodium. Maintain a healthy weight Body mass index (BMI) is used to identify weight problems. It estimates body fat based on height and weight. Your health care provider can help determine your BMI and help you achieve or maintain a healthy weight. Get regular exercise Get regular exercise. This is one of the most important things you can do for your health. Most adults should:  Exercise for at least 150 minutes each week. The exercise should increase your heart rate and make you sweat (moderate-intensity exercise).  Do strengthening exercises at least twice a week. This is in addition to the moderate-intensity exercise.  Spend less time sitting. Even light physical activity can be beneficial. Watch cholesterol and blood lipids Have your blood tested for lipids and cholesterol at 78 years of age, then have this test every 5 years. Have your cholesterol levels checked more often if:  Your lipid or cholesterol levels are high.  You are older than 78 years of age.  You are at high risk for heart disease. What should I know about cancer screening? Depending on your health history and family history, you may need to have cancer screening at various ages. This may include screening for:  Breast cancer.  Cervical cancer.  Colorectal cancer.  Skin cancer.  Lung cancer. What should I know about heart disease, diabetes, and high blood  pressure? Blood pressure and heart disease  High blood pressure causes heart disease and increases the risk of stroke. This is more likely to develop in people who have high blood pressure readings, are of African descent, or are overweight.  Have your blood pressure checked: ? Every 3-5 years if you are 18-39 years of age. ? Every year if you are 40 years old or older. Diabetes Have regular diabetes screenings. This checks your fasting blood sugar level. Have the screening done:  Once every three years after age 40 if you are at a normal weight and have a low risk for diabetes.  More often and at a younger age if you are overweight or have a high risk for diabetes. What should I know about preventing infection? Hepatitis B If you have a higher risk for hepatitis B, you should be screened for this virus. Talk with your health care provider to find out if you are at risk for hepatitis B infection. Hepatitis C Testing is recommended for:  Everyone born from 1945 through 1965.  Anyone with known risk factors for hepatitis C. Sexually transmitted infections (STIs)  Get screened for STIs, including gonorrhea and chlamydia, if: ? You are sexually active and are younger than 78 years of age. ? You are older than 78 years of age and your health care provider tells you that you are at risk for this type of infection. ? Your sexual activity has changed since you were last screened, and you are at increased risk for chlamydia or gonorrhea. Ask your health care provider if   you are at risk.  Ask your health care provider about whether you are at high risk for HIV. Your health care provider may recommend a prescription medicine to help prevent HIV infection. If you choose to take medicine to prevent HIV, you should first get tested for HIV. You should then be tested every 3 months for as long as you are taking the medicine. Pregnancy  If you are about to stop having your period (premenopausal) and  you may become pregnant, seek counseling before you get pregnant.  Take 400 to 800 micrograms (mcg) of folic acid every day if you become pregnant.  Ask for birth control (contraception) if you want to prevent pregnancy. Osteoporosis and menopause Osteoporosis is a disease in which the bones lose minerals and strength with aging. This can result in bone fractures. If you are 65 years old or older, or if you are at risk for osteoporosis and fractures, ask your health care provider if you should:  Be screened for bone loss.  Take a calcium or vitamin D supplement to lower your risk of fractures.  Be given hormone replacement therapy (HRT) to treat symptoms of menopause. Follow these instructions at home: Lifestyle  Do not use any products that contain nicotine or tobacco, such as cigarettes, e-cigarettes, and chewing tobacco. If you need help quitting, ask your health care provider.  Do not use street drugs.  Do not share needles.  Ask your health care provider for help if you need support or information about quitting drugs. Alcohol use  Do not drink alcohol if: ? Your health care provider tells you not to drink. ? You are pregnant, may be pregnant, or are planning to become pregnant.  If you drink alcohol: ? Limit how much you use to 0-1 drink a day. ? Limit intake if you are breastfeeding.  Be aware of how much alcohol is in your drink. In the [place] one drink equals one 12 oz bottle of beer (355 mL), one 5 oz glass of wine (148 mL), or one 1 oz glass of hard liquor (44 mL). General instructions  Schedule regular health, dental, and eye exams.  Stay current with your vaccines.  Tell your health care provider if: ? You often feel depressed. ? You have ever been abused or do not feel safe at home. Summary  Adopting a healthy lifestyle and getting preventive care are important in promoting health and wellness.  Follow your health care provider's instructions about healthy  diet, exercising, and getting tested or screened for diseases.  Follow your health care provider's instructions on monitoring your cholesterol and blood pressure. This information is not intended to replace advice given to you by your health care provider. Make sure you discuss any questions you have with your health care provider. Document Released: 12/08/2010 Document Revised: 05/18/2018 Document Reviewed: 05/18/2018 Elsevier Patient Education  2020 Elsevier Inc.  

## 2019-03-09 NOTE — Assessment & Plan Note (Signed)
Injection given today.  Has been nearly 6 months since previous injection.  Has responded well to this.  Discussed icing regimen and home exercise.  Could be a candidate for Visco supplementation if needed.  Patient declined any type of custom bracing at the moment.  Follow-up again in 4 to 8 weeks

## 2019-03-09 NOTE — Assessment & Plan Note (Signed)
Checking lipid panel and adjust as needed.  

## 2019-03-09 NOTE — Assessment & Plan Note (Signed)
BP at goal on her spironolactone. Checking CMP and adjust as needed.

## 2019-03-09 NOTE — Patient Instructions (Signed)
Good to see you  Ice 20 minutes 2 times daily. Usually after activity and before bed. pennsaid pinkie amount topically 2 times daily as needed.  If this works great  If pain comes back we have other injections Have an appointment set up in 2 months

## 2019-03-09 NOTE — Assessment & Plan Note (Signed)
Using flonase and zyrtec with good success.

## 2019-03-28 ENCOUNTER — Other Ambulatory Visit: Payer: Self-pay | Admitting: Internal Medicine

## 2019-04-17 ENCOUNTER — Other Ambulatory Visit: Payer: Self-pay | Admitting: Family

## 2019-04-17 ENCOUNTER — Telehealth: Payer: Self-pay | Admitting: *Deleted

## 2019-04-17 NOTE — Progress Notes (Signed)
[name] [address] [place], [place] [address] Phone: [phone] Subjective:   I [name] am serving as a Neurosurgeon for Dr. Antoine Primas.   CC: back and knee pain   ERX:[id number]   03/09/2019 Injection given today.  Has been nearly 6 months since previous injection.  Has responded well to this.  Discussed icing regimen and home exercise.  Could be a candidate for Visco supplementation if needed.  Patient declined any type of custom bracing at the moment.  Follow-up again in 4 to 8 weeks  04/18/2019 Dalila Arca is a 78 y.o. female coming in with complaint of back and right knee pain. States her lower back was so painful yesterday she could hardly walk. Right knee is painful also giving her some trouble with walking. Knee feels better in extension.       Past Medical History:  Diagnosis Date  . Arthritis   . Cataract    removed both eyes   . Episodic tension type headache   . Esophageal reflux   . GERD (gastroesophageal reflux disease)   . Glaucoma   . Heart murmur    as young adult  . Hyperlipidemia   . Hypertension   . Internal hemorrhoids   . Kidney stone on left side Hx-date unknown  . Left rotator cuff tear    Past Surgical History:  Procedure Laterality Date  . COLONOSCOPY  11/20/2008  . COLONOSCOPY    . KNEE ARTHROSCOPY Left 2004  . SHOULDER OPEN ROTATOR CUFF REPAIR Left 08/30/2012   Procedure: ROTATOR CUFF REPAIR SHOULDER OPEN, POSSIBLE GRAFT AND ANCHORS;  Surgeon: Jacki Cones, MD;  Location: [facility] [place];  Service: Orthopedics;  Laterality: Left;  . TOTAL ABDOMINAL HYSTERECTOMY W/ BILATERAL SALPINGOOPHORECTOMY  1990's   Social History   Socioeconomic History  . Marital status: Widowed    Spouse name: Not on file  . Number of children: 2  . Years of education: Not on file  . Highest education level: Not on file  Occupational History  . Occupation: Lexicographer  Social Needs  . Financial  resource strain: Not hard at all  . Food insecurity    Worry: Never true    Inability: Never true  . Transportation needs    Medical: No    Non-medical: No  Tobacco Use  . Smoking status: Never Smoker  . Smokeless tobacco: Never Used  Substance and Sexual Activity  . Alcohol use: No  . Drug use: No  . Sexual activity: Never  Lifestyle  . Physical activity    Days per week: 0 days    Minutes per session: 0 min  . Stress: Not at all  Relationships  . Social connections    Talks on phone: More than three times a week    Gets together: More than three times a week    Attends religious service: More than 4 times per year    Active member of club or organization: Not on file    Attends meetings of clubs or organizations: More than 4 times per year    Relationship status: Widowed  Other Topics Concern  . Not on file  Social History Narrative  . Not on file   Allergies  Allergen Reactions  . Hydrocodone-Acetaminophen     REACTION: GI upset  . Tramadol Itching   Family History  Problem Relation Age of Onset  . Hypertension Mother   . Diabetes Mother   . Cancer Mother  in leg  . Varicose Veins Maternal Uncle   . Colon cancer Neg Hx   . Colon polyps Neg Hx   . Esophageal cancer Neg Hx   . Stomach cancer Neg Hx   . Rectal cancer Neg Hx     Current Outpatient Medications (Endocrine & Metabolic):  .  estradiol (ESTRACE) 0.5 MG tablet, Take 0.5 mg by mouth daily.  .  predniSONE (DELTASONE) 20 MG tablet, Take 2 tablets (40 mg total) by mouth daily with breakfast.  Current Outpatient Medications (Cardiovascular):  .  spironolactone (ALDACTONE) 25 MG tablet, TAKE 1 TABLET BY MOUTH TWICE DAILY  Current Outpatient Medications (Respiratory):  .  cetirizine (ZYRTEC) 10 MG tablet, Take 1 tablet (10 mg total) by mouth daily. .  fluticasone (FLONASE) 50 MCG/ACT nasal spray, PLACE 2 SPRAYS INTO BOTH NOSTRILS DAILY. SHAKE LIQUID AND USE 2 SPRAYS IN EACH NOSTRIL DAILY   Current Outpatient Medications (Analgesics):  .  acetaminophen (TYLENOL) 500 MG tablet, Take 500 mg by mouth every 6 (six) hours as needed. Marland Kitchen.  aspirin 81 MG tablet, Take 81 mg by mouth daily.   .  meloxicam (MOBIC) 15 MG tablet, TAKE 1 TABLET BY MOUTH EVERY DAY   Current Outpatient Medications (Other):  .  calcium-vitamin D (OSCAL WITH D) 500-200 MG-UNIT per tablet, Take 1 tablet by mouth daily with breakfast.  .  Cholecalciferol (VITAMIN D3) 50 MCG (2000 UT) TABS, Take by mouth daily. .  cyclobenzaprine (FLEXERIL) 5 MG tablet, Take 1 tablet (5 mg total) by mouth 3 (three) times daily as needed for muscle spasms. .  Diclofenac Sodium (PENNSAID) 2 % SOLN, Place onto the skin. Marland Kitchen.  diclofenac sodium (VOLTAREN) 1 % GEL, Apply 2 g topically 4 (four) times daily. .  famotidine (PEPCID) 20 MG tablet, Take 1 tablet (20 mg total) by mouth daily. Marland Kitchen.  latanoprost (XALATAN) 0.005 % ophthalmic solution, Place 1 drop into both eyes at bedtime. .  metroNIDAZOLE (FLAGYL) 250 MG tablet, Take 250 mg by mouth as needed. Marland Kitchen.  omeprazole (PRILOSEC) 40 MG capsule, TAKE 1 CAPSULE BY MOUTH TWICE DAILY 30 MINUTES BEFORE FIRST AND LAST MEAL OF THE DAY .  ondansetron (ZOFRAN) 4 MG tablet, Take 4 mg by mouth every 8 (eight) hours as needed for nausea or vomiting (Take 1 tablet by mouth every eight hours as needed for nausea). Marland Kitchen.  OVER THE COUNTER MEDICATION, Go- [facility] daily for joints .  potassium chloride SA (K-DUR,KLOR-CON) 20 MEQ tablet, TAKE 1 TABLET(20 MEQ) BY MOUTH DAILY .  prednisoLONE acetate (PRED MILD) 0.12 % ophthalmic suspension, 1 drop as needed. Pt uses as needed .  triamcinolone cream (KENALOG) 0.1 %, Apply 1 application topically 2 (two) times daily. .  Turmeric (CURCUMIN 95) 500 MG CAPS, Take by mouth. .  gabapentin (NEURONTIN) 100 MG capsule, Take 1 capsule (100 mg total) by mouth at bedtime.    Past medical history, social, surgical and family history all reviewed in electronic medical record.  No  pertanent information unless stated regarding to the chief complaint.   Review of Systems:  No headache, visual changes, nausea, vomiting, diarrhea, constipation, dizziness, abdominal pain, skin rash, fevers, chills, night sweats, weight loss, swollen lymph nodes, body aches chest pain, shortness of breath, mood changes.  Positive muscle aches, no joint swelling  Objective  Blood pressure 100/70, pulse 68, height 5\' 1"  (1.549 m), weight 194 lb (88 kg), SpO2 98 %.    General: No apparent distress alert and oriented x3 mood and affect normal,  dressed appropriately.  HEENT: Pupils equal, extraocular movements intact  Respiratory: Patient's speak in full sentences and does not appear short of breath  Cardiovascular: No lower extremity edema, non tender, no erythema  Skin: Warm dry intact with no signs of infection or rash on extremities or on axial skeleton.  Abdomen: Soft nontender  Neuro: Cranial nerves II through XII are intact, neurovascularly intact in all extremities with 2+ DTRs and 2+ pulses.  Lymph: No lymphadenopathy of posterior or anterior cervical chain or axillae bilaterally.  Gait normal with good balance and coordination.  MSK:  Non tender with full range of motion and good stability and symmetric strength and tone of shoulders, elbows, wrist, hip and ankles bilaterally.  Knee: right valgus deformity noted. Large thigh to calf ratio.  Tender to palpation over medial and PF joint line.  ROM full in flexion and extension and lower leg rotation. instability with valgus force.  painful patellar compression. Patellar glide with moderate crepitus. Patellar and quadriceps tendons unremarkable. Hamstring and quadriceps strength is normal. Contralateral knee shows  Back Exam:  Inspection: Loss of lordosis Motion: Flexion 40 deg, Extension 25 deg, Side Bending to 35 deg bilaterally,  Rotation to 45 deg bilaterally  SLR laying: Negative  XSLR laying: Negative  Palpable  tenderness: Tender to palpation paraspinal musculature lumbar spine right greater than left. FABER: Faber bilaterally. Sensory change: Gross sensation intact to all lumbar and sacral dermatomes.  Reflexes: 2+ at both patellar tendons, 2+ at achilles tendons, Babinski's downgoing.  Strength at foot  Plantar-flexion: 5/5 Dorsi-flexion: 5/5 Eversion: 5/5 Inversion: 5/5  Leg strength  Quad: 5/5 Hamstring: 5/5 Hip flexor: 5/5 Hip abductors: 4/5 symmetric  After informed written and verbal consent, patient was seated on exam table.  Right knee was prepped with alcohol swab and utilizing anterolateral approach, patient's left knee space was injected with 22mg /mL of SynviscOne (sodium hyaluronate) in a prefilled syringe was injected easily into the knee through a 22-gauge needle..Patient tolerated the procedure well without immediate complications.    Impression and Recommendations:     This case required medical decision making of moderate complexity. The above documentation has been reviewed and is accurate and complete Lyndal Pulley, DO       Note: This dictation was prepared with Dragon dictation along with smaller phrase technology. Any transcriptional errors that result from this process are unintentional.

## 2019-04-17 NOTE — Telephone Encounter (Signed)
Patient scheduled tomorrow at 7:30

## 2019-04-17 NOTE — Telephone Encounter (Signed)
Pt left msg stating she would like to schedule an appt.  She had a steroid injection on 03/09/2019 so pt would be due for a Synvisc injection.

## 2019-04-18 ENCOUNTER — Encounter: Payer: Self-pay | Admitting: Family Medicine

## 2019-04-18 ENCOUNTER — Ambulatory Visit (INDEPENDENT_AMBULATORY_CARE_PROVIDER_SITE_OTHER): Payer: BC Managed Care – PPO | Admitting: Family Medicine

## 2019-04-18 ENCOUNTER — Other Ambulatory Visit: Payer: Self-pay

## 2019-04-18 VITALS — BP 100/70 | HR 68 | Ht 61.0 in | Wt 194.0 lb

## 2019-04-18 DIAGNOSIS — M2550 Pain in unspecified joint: Secondary | ICD-10-CM

## 2019-04-18 DIAGNOSIS — M5136 Other intervertebral disc degeneration, lumbar region: Secondary | ICD-10-CM

## 2019-04-18 DIAGNOSIS — M170 Bilateral primary osteoarthritis of knee: Secondary | ICD-10-CM | POA: Diagnosis not present

## 2019-04-18 MED ORDER — GABAPENTIN 100 MG PO CAPS: 100.0000 mg | ORAL_CAPSULE | Freq: Every day | ORAL | 3 refills | Status: DC

## 2019-04-18 MED ORDER — KETOROLAC TROMETHAMINE 60 MG/2ML IM SOLN
60.0000 mg | Freq: Once | INTRAMUSCULAR | Status: AC
  Administered 2019-04-18: 08:00:00 60 mg via INTRAMUSCULAR

## 2019-04-18 MED ORDER — METHYLPREDNISOLONE ACETATE 80 MG/ML IJ SUSP
80.0000 mg | Freq: Once | INTRAMUSCULAR | Status: AC
  Administered 2019-04-18: 08:00:00 80 mg via INTRAMUSCULAR

## 2019-04-18 MED ORDER — PREDNISONE 20 MG PO TABS: 40.0000 mg | ORAL_TABLET | Freq: Every day | ORAL | 0 refills | Status: DC

## 2019-04-18 NOTE — Assessment & Plan Note (Signed)
Worsening symptoms with radicular symptoms now down the right leg.  Does have some weakness of the right leg noted today on exam is willing deep tendon reflexes are decreased.  We discussed the possibility need for advanced imaging.  We discussed with patient about icing regimen and home exercises.  Patient will come back again in 2 weeks.  Could be a candidate for epidurals if necessary.

## 2019-04-18 NOTE — Patient Instructions (Signed)
Gabapentin 100 mg at night Prednisone daily for 5 days starting tomorrow If no better give [place] a call we will get MRI See me again in 2 weeks

## 2019-04-18 NOTE — Assessment & Plan Note (Signed)
Patient given injection today.  Tolerated the procedure well.  Discussed with her secondary to the severity of the arthritis she will need to consider the possibility of surgical intervention in the near future.  Discussed which activities to doing which wants to avoid.  Follow-up again 4 weeks

## 2019-04-25 ENCOUNTER — Other Ambulatory Visit: Payer: Self-pay | Admitting: Family

## 2019-05-02 ENCOUNTER — Ambulatory Visit: Payer: BC Managed Care – PPO | Admitting: Family Medicine

## 2019-05-02 ENCOUNTER — Ambulatory Visit (INDEPENDENT_AMBULATORY_CARE_PROVIDER_SITE_OTHER): Payer: BC Managed Care – PPO | Admitting: Family Medicine

## 2019-05-02 ENCOUNTER — Encounter: Payer: Self-pay | Admitting: Family Medicine

## 2019-05-02 VITALS — BP 124/82 | HR 73 | Wt 195.0 lb

## 2019-05-02 DIAGNOSIS — M5416 Radiculopathy, lumbar region: Secondary | ICD-10-CM | POA: Diagnosis not present

## 2019-05-02 DIAGNOSIS — M5136 Other intervertebral disc degeneration, lumbar region: Secondary | ICD-10-CM | POA: Diagnosis not present

## 2019-05-02 DIAGNOSIS — M1711 Unilateral primary osteoarthritis, right knee: Secondary | ICD-10-CM | POA: Diagnosis not present

## 2019-05-02 NOTE — Patient Instructions (Addendum)
[facility] [phone] for back MRI [facility] will call for appt with Dr. Latanya Maudlin for knee We will be in touch once we have MRI results

## 2019-05-02 NOTE — Assessment & Plan Note (Signed)
Repeat injection given today.  Tolerated the procedure well.  Did not do well with the viscosupplementation.  Patient will need surgical intervention and referred to orthopedic surgery

## 2019-05-02 NOTE — Progress Notes (Signed)
[name] [address] [place], [place] [address] Phone: [phone] Subjective:   Catherine Hoffman, am serving as a scribe for Dr. Antoine Primas.   CC: Back pain follow-up, knee pain  [id number]   04/18/2019 Patient given injection today.  Tolerated the procedure well.  Discussed with her secondary to the severity of the arthritis she will need to consider the possibility of surgical intervention in the near future.  Discussed which activities to doing which wants to avoid.  Follow-up again 4 weeks  Update 05/02/2019 Catherine Hoffman is a 78 y.o. female coming in with complaint of right knee pain. Patient feels a "tearing" sensation with knee flexion. Patient feels she has good days and bad days. Does not feel as if she is improving.  Patient has known arthritic changes of the knee.  Known arthritis of the back as well.     Past Medical History:  Diagnosis Date  . Arthritis   . Cataract    removed both eyes   . Episodic tension type headache   . Esophageal reflux   . GERD (gastroesophageal reflux disease)   . Glaucoma   . Heart murmur    as young adult  . Hyperlipidemia   . Hypertension   . Internal hemorrhoids   . Kidney stone on left side Hx-date unknown  . Left rotator cuff tear    Past Surgical History:  Procedure Laterality Date  . COLONOSCOPY  11/20/2008  . COLONOSCOPY    . KNEE ARTHROSCOPY Left 2004  . SHOULDER OPEN ROTATOR CUFF REPAIR Left 08/30/2012   Procedure: ROTATOR CUFF REPAIR SHOULDER OPEN, POSSIBLE GRAFT AND ANCHORS;  Surgeon: Jacki Cones, MD;  Location: [facility];  Service: Orthopedics;  Laterality: Left;  . TOTAL ABDOMINAL HYSTERECTOMY W/ BILATERAL SALPINGOOPHORECTOMY  1990's   Social History   Socioeconomic History  . Marital status: Widowed    Spouse name: Not on file  . Number of children: 2  . Years of education: Not on file  . Highest education level: Not on file  Occupational History   . Occupation: Lexicographer  Social Needs  . Financial resource strain: Not hard at all  . Food insecurity    Worry: Never true    Inability: Never true  . Transportation needs    Medical: No    Non-medical: No  Tobacco Use  . Smoking status: Never Smoker  . Smokeless tobacco: Never Used  Substance and Sexual Activity  . Alcohol use: No  . Drug use: No  . Sexual activity: Never  Lifestyle  . Physical activity    Days per week: 0 days    Minutes per session: 0 min  . Stress: Not at all  Relationships  . Social connections    Talks on phone: More than three times a week    Gets together: More than three times a week    Attends religious service: More than 4 times per year    Active member of club or organization: Not on file    Attends meetings of clubs or organizations: More than 4 times per year    Relationship status: Widowed  Other Topics Concern  . Not on file  Social History Narrative  . Not on file   Allergies  Allergen Reactions  . Hydrocodone-Acetaminophen     REACTION: GI upset  . Tramadol Itching   Family History  Problem Relation Age of Onset  . Hypertension Mother   . Diabetes  Mother   . Cancer Mother        in leg  . Varicose Veins Maternal Uncle   . Colon cancer Neg Hx   . Colon polyps Neg Hx   . Esophageal cancer Neg Hx   . Stomach cancer Neg Hx   . Rectal cancer Neg Hx     Current Outpatient Medications (Endocrine & Metabolic):  .  estradiol (ESTRACE) 0.5 MG tablet, Take 0.5 mg by mouth daily.  .  predniSONE (DELTASONE) 20 MG tablet, Take 2 tablets (40 mg total) by mouth daily with breakfast.  Current Outpatient Medications (Cardiovascular):  .  spironolactone (ALDACTONE) 25 MG tablet, TAKE 1 TABLET BY MOUTH TWICE DAILY  Current Outpatient Medications (Respiratory):  .  cetirizine (ZYRTEC) 10 MG tablet, TAKE 1 TABLET BY MOUTH EVERY DAY .  fluticasone (FLONASE) 50 MCG/ACT nasal spray, PLACE 2 SPRAYS INTO BOTH NOSTRILS DAILY. SHAKE LIQUID  AND USE 2 SPRAYS IN EACH NOSTRIL DAILY  Current Outpatient Medications (Analgesics):  .  acetaminophen (TYLENOL) 500 MG tablet, Take 500 mg by mouth every 6 (six) hours as needed. Marland Kitchen  aspirin 81 MG tablet, Take 81 mg by mouth daily.   .  meloxicam (MOBIC) 15 MG tablet, TAKE 1 TABLET BY MOUTH EVERY DAY   Current Outpatient Medications (Other):  .  calcium-vitamin D (OSCAL WITH D) 500-200 MG-UNIT per tablet, Take 1 tablet by mouth daily with breakfast.  .  Cholecalciferol (VITAMIN D3) 50 MCG (2000 UT) TABS, Take by mouth daily. .  cyclobenzaprine (FLEXERIL) 5 MG tablet, Take 1 tablet (5 mg total) by mouth 3 (three) times daily as needed for muscle spasms. .  Diclofenac Sodium (PENNSAID) 2 % SOLN, Place onto the skin. Marland Kitchen  diclofenac sodium (VOLTAREN) 1 % GEL, Apply 2 g topically 4 (four) times daily. .  famotidine (PEPCID) 20 MG tablet, Take 1 tablet (20 mg total) by mouth daily. Marland Kitchen  gabapentin (NEURONTIN) 100 MG capsule, Take 1 capsule (100 mg total) by mouth at bedtime. Marland Kitchen  latanoprost (XALATAN) 0.005 % ophthalmic solution, Place 1 drop into both eyes at bedtime. .  metroNIDAZOLE (FLAGYL) 250 MG tablet, Take 250 mg by mouth as needed. Marland Kitchen  omeprazole (PRILOSEC) 40 MG capsule, TAKE 1 CAPSULE BY MOUTH TWICE DAILY 30 MINUTES BEFORE FIRST AND LAST MEAL OF THE DAY .  ondansetron (ZOFRAN) 4 MG tablet, Take 4 mg by mouth every 8 (eight) hours as needed for nausea or vomiting (Take 1 tablet by mouth every eight hours as needed for nausea). Marland Kitchen  OVER THE COUNTER MEDICATION, Go- [facility] daily for joints .  potassium chloride SA (K-DUR,KLOR-CON) 20 MEQ tablet, TAKE 1 TABLET(20 MEQ) BY MOUTH DAILY .  prednisoLONE acetate (PRED MILD) 0.12 % ophthalmic suspension, 1 drop as needed. Pt uses as needed .  triamcinolone cream (KENALOG) 0.1 %, Apply 1 application topically 2 (two) times daily. .  Turmeric (CURCUMIN 95) 500 MG CAPS, Take by mouth.    Past medical history, social, surgical and family history all  reviewed in electronic medical record.  No pertanent information unless stated regarding to the chief complaint.   Review of Systems:  No headache, visual changes, nausea, vomiting, diarrhea, constipation, dizziness, abdominal pain, skin rash, fevers, chills, night sweats, weight loss, swollen lymph nodes, body aches, joint swelling,  chest pain, shortness of breath, mood changes.  Positive muscle aches  Objective  Blood pressure 124/82, pulse 73, weight 195 lb (88.5 kg), SpO2 97 %.    General: No apparent distress alert and  oriented x3 mood and affect normal, dressed appropriately.  HEENT: Pupils equal, extraocular movements intact  Respiratory: Patient's speak in full sentences and does not appear short of breath  Cardiovascular: No lower extremity edema, non tender, no erythema  Skin: Warm dry intact with no signs of infection or rash on extremities or on axial skeleton.  Abdomen: Soft nontender  Neuro: Cranial nerves II through XII are intact, neurovascularly intact in all extremities with 2+ DTRs and 2+ pulses.  Lymph: No lymphadenopathy of posterior or anterior cervical chain or axillae bilaterally.  Gait severely antalgic MSK:  tender with range of motion and stability and symmetric strength and tone of shoulders, elbows, wrist, hip, and ankles bilaterally.  Back exam shows some mild decrease in range of motion.  Patient does have some degenerative scoliosis noted.  Positive radicular symptoms with straight leg test on the right side.  4-5 strength of the right lower extremity compared to the contralateral side. Knee: Right valgus deformity noted. Large thigh to calf ratio.  Tender to palpation over medial and PF joint line.  ROM full in flexion and extension and lower leg rotation. instability with valgus force.  painful patellar compression. Patellar glide with moderate crepitus. Patellar and quadriceps tendons unremarkable. Hamstring and quadriceps strength is normal.  Contralateral knee shows arthritic changes but no significant instability  After informed written and verbal consent, patient was seated on exam table. Right knee was prepped with alcohol swab and utilizing anterolateral approach, patient's right knee space was injected with 4:1  marcaine 0.5%: Kenalog 40mg /dL. Patient tolerated the procedure well without immediate complications.   Impression and Recommendations:     This case required medical decision making of moderate complexity. The above documentation has been reviewed and is accurate and complete Judi SaaZachary M Smith, DO       Note: This dictation was prepared with Dragon dictation along with smaller phrase technology. Any transcriptional errors that result from this process are unintentional.

## 2019-05-02 NOTE — Assessment & Plan Note (Signed)
Degenerative disc disease.  Patient really had worsening symptoms with somewhat more radicular symptoms as well.  Discussed with patient at this length I would like to consider the possibility of possible epidurals.  Due to patient not doing well with other conservative therapy MRI ordered today.

## 2019-05-14 ENCOUNTER — Ambulatory Visit
Admission: RE | Admit: 2019-05-14 | Discharge: 2019-05-14 | Disposition: A | Discharge status: Home / Self Care | Payer: BC Managed Care – PPO | Source: Ambulatory Visit | Attending: Family Medicine | Admitting: Family Medicine

## 2019-05-14 ENCOUNTER — Other Ambulatory Visit: Payer: Self-pay

## 2019-05-14 DIAGNOSIS — M5416 Radiculopathy, lumbar region: Secondary | ICD-10-CM

## 2019-05-16 ENCOUNTER — Telehealth: Payer: Self-pay | Admitting: *Deleted

## 2019-05-16 ENCOUNTER — Other Ambulatory Visit: Payer: Self-pay

## 2019-05-16 DIAGNOSIS — M5416 Radiculopathy, lumbar region: Secondary | ICD-10-CM

## 2019-05-16 NOTE — Telephone Encounter (Signed)
Pt left msg requesting a call back to discuss possible injection after MRI.

## 2019-05-16 NOTE — Telephone Encounter (Signed)
Epidural ordered and patient notified.  

## 2019-05-18 ENCOUNTER — Other Ambulatory Visit: Payer: Self-pay

## 2019-05-18 ENCOUNTER — Ambulatory Visit
Admission: RE | Admit: 2019-05-18 | Discharge: 2019-05-18 | Disposition: A | Discharge status: Home / Self Care | Payer: BC Managed Care – PPO | Source: Ambulatory Visit | Attending: Family Medicine | Admitting: Family Medicine

## 2019-05-18 DIAGNOSIS — M5416 Radiculopathy, lumbar region: Secondary | ICD-10-CM

## 2019-05-18 MED ORDER — IOPAMIDOL (ISOVUE-M 200) INJECTION 41%
1.0000 mL | Freq: Once | INTRAMUSCULAR | Status: AC
  Administered 2019-05-18: 12:00:00 1 mL via EPIDURAL

## 2019-05-18 MED ORDER — METHYLPREDNISOLONE ACETATE 40 MG/ML INJ SUSP (RADIOLOG
120.0000 mg | Freq: Once | INTRAMUSCULAR | Status: AC
  Administered 2019-05-18: 12:00:00 120 mg via EPIDURAL

## 2019-05-18 NOTE — Discharge Instructions (Signed)

## 2019-06-14 ENCOUNTER — Other Ambulatory Visit: Payer: Self-pay

## 2019-06-14 ENCOUNTER — Telehealth: Payer: Self-pay | Admitting: Internal Medicine

## 2019-06-14 DIAGNOSIS — Z20822 Contact with and (suspected) exposure to covid-19: Secondary | ICD-10-CM

## 2019-06-14 NOTE — Telephone Encounter (Signed)
Patient called in stating she has been notified by her employer stating that she is needing to have a note from Dr.Smith stating that she is still needing to be out of work, for her to continue to get paid. Please advise as pt is stating she needs this today.

## 2019-06-15 LAB — NOVEL CORONAVIRUS, NAA: SARS-CoV-2, NAA: NOT DETECTED

## 2019-06-15 NOTE — Telephone Encounter (Signed)
Spoke with patient. Scheduled for virtual visit.

## 2019-06-16 ENCOUNTER — Encounter: Payer: Self-pay | Admitting: Family Medicine

## 2019-06-16 ENCOUNTER — Ambulatory Visit (INDEPENDENT_AMBULATORY_CARE_PROVIDER_SITE_OTHER): Payer: BC Managed Care – PPO | Admitting: Family Medicine

## 2019-06-16 DIAGNOSIS — M1711 Unilateral primary osteoarthritis, right knee: Secondary | ICD-10-CM

## 2019-06-16 DIAGNOSIS — M5136 Other intervertebral disc degeneration, lumbar region: Secondary | ICD-10-CM

## 2019-06-16 NOTE — Progress Notes (Signed)
Virtual Visit via Video Note  I connected with [name] on 06/16/19 at  8:15 AM EST by a video enabled telemedicine application and verified that I am speaking with the correct person using two identifiers.  Location: Patient: In home setting Provider: In office setting   I discussed the limitations of evaluation and management by telemedicine and the availability of in person appointments. The patient expressed understanding and agreed to proceed.  History of Present Illness: 79 year old female who is having chronic back pain with lumbar radicular symptoms.    Observations/Objective: Had difficulty well with the visual platform.  Discussed with patient the overall area.  Patient seemed alert and oriented. Had an MRI May 14, 2019 secondary to failed conservative therapy MRI showed advanced chronic disc degeneration most pronounced with severe spinal stenosis at L4-L5, attempted epidural on May 18, 2019 at L4-L5.  Patient was lost to follow-up.  Patient states that the back is actually feeling much better.  Feels low but the knee pain seems to be the worsening thing at the moment.  Significant instability of the knees right greater than left.  Patient did see an orthopedic surgeon was given a custom brace but states that she is unable to wear it on a regular basis.  Feels the instability is worsening.  Was going to have surgical intervention on the knees but she is concerned secondary to the coronavirus outbreak  Seen the patient urgently secondary to impending Covid test  Assessment and Plan: 79 year old female with advanced spinal stenosis at L4-L5 and moderate to severe spinal stenosis at multiple other levels in the lumbar spine failed conservative therapy and has an epidural x1.  Patient was written out of work for the month of December.  Patient does feel like her back is doing better but is continuing to have knee pain.  Patient has seen a orthopedic surgeon previously for the  knees and is going to have a knee replacement.  Patient though is concerned about having it at this time secondary to the coronavirus outbreak.  Feels like she is not she is comfortable having it during this outbreak.  Patient is unable to work she feels like at this moment secondary to the amount of pain and instability of the knees with her caring for her kids.  We discussed we would work her out of work for another 3 weeks but after that this would be having to come from orthopedic surgery to discuss further.  Patient is in agreement with the plan.   Follow Up Instructions: As needed    I discussed the assessment and treatment plan with the patient. The patient was provided an opportunity to ask questions and all were answered. The patient agreed with the plan and demonstrated an understanding of the instructions.   The patient was advised to call back or seek an in-person evaluation if the symptoms worsen or if the condition fails to improve as anticipated.  I provided 26 minutes of non-face-to-face time during this encounter.   Judi Saa, DO

## 2019-06-20 ENCOUNTER — Telehealth: Payer: Self-pay

## 2019-06-20 NOTE — Telephone Encounter (Signed)
Patient called stating that she talked to Dr. Katrinka Blazing and was told to come in on Wednesday at 7:30 (tomorrow) to get shots in her knees. I did not see anything on the chart told patient I would send a note

## 2019-06-21 ENCOUNTER — Encounter: Payer: Self-pay | Admitting: Family Medicine

## 2019-06-21 ENCOUNTER — Other Ambulatory Visit: Payer: Self-pay

## 2019-06-21 ENCOUNTER — Ambulatory Visit (INDEPENDENT_AMBULATORY_CARE_PROVIDER_SITE_OTHER): Payer: BC Managed Care – PPO | Admitting: Family Medicine

## 2019-06-21 DIAGNOSIS — M1711 Unilateral primary osteoarthritis, right knee: Secondary | ICD-10-CM

## 2019-06-21 NOTE — Progress Notes (Signed)
[name] [address] [place] [address] Phone: [phone] Subjective:   Bruce Donath, am serving as a scribe for Dr. Antoine Primas. This visit occurred during the SARS-CoV-2 public health emergency.  Safety protocols were in place, including screening questions prior to the visit, additional usage of staff PPE, and extensive cleaning of exam room while observing appropriate contact time as indicated for disinfecting solutions.      CC: Back pain follow-up, knee pain  [id number]  Catherine Hoffman is a 79 y.o. female coming in with complaint of bilateral knee pain.  Has had known arthritic changes of the knees.  Last injections April 18, 2019.  Worsening pain at this time.  Affecting daily activities.  Was given the viscosupplementation at last exam.  Patient is to get a knee replacement but is trying to wait until better control of the coronavirus in the neighborhood.  Patient has severe spinal stenosis MRI showed advanced chronic disc degeneration especially at L3-L4 with severe spinal stenosis at L4-L5 and significant other arthritic changes.  Patient is to undergo an epidural in the near future had 1 May 18, 2019 improvement since injection but feels the leg pain is somewhat associated with her back as well as the arthritis      Past Medical History:  Diagnosis Date  . Arthritis   . Cataract    removed both eyes   . Episodic tension type headache   . Esophageal reflux   . GERD (gastroesophageal reflux disease)   . Glaucoma   . Heart murmur    as young adult  . Hyperlipidemia   . Hypertension   . Internal hemorrhoids   . Kidney stone on left side Hx-date unknown  . Left rotator cuff tear    Past Surgical History:  Procedure Laterality Date  . COLONOSCOPY  11/20/2008  . COLONOSCOPY    . KNEE ARTHROSCOPY Left 2004  . SHOULDER OPEN ROTATOR CUFF REPAIR Left 08/30/2012   Procedure: ROTATOR CUFF REPAIR SHOULDER OPEN, POSSIBLE  GRAFT AND ANCHORS;  Surgeon: Jacki Cones, MD;  Location: [facility];  Service: Orthopedics;  Laterality: Left;  . TOTAL ABDOMINAL HYSTERECTOMY W/ BILATERAL SALPINGOOPHORECTOMY  1990's   Social History   Socioeconomic History  . Marital status: Widowed    Spouse name: Not on file  . Number of children: 2  . Years of education: Not on file  . Highest education level: Not on file  Occupational History  . Occupation: Lexicographer  Tobacco Use  . Smoking status: Never Smoker  . Smokeless tobacco: Never Used  Substance and Sexual Activity  . Alcohol use: No  . Drug use: No  . Sexual activity: Never  Other Topics Concern  . Not on file  Social History Narrative  . Not on file   Social Determinants of Health   Financial Resource Strain:   . Difficulty of Paying Living Expenses: Not on file  Food Insecurity:   . Worried About Programme researcher, broadcasting/film/video in the Last Year: Not on file  . Ran Out of Food in the Last Year: Not on file  Transportation Needs:   . Lack of Transportation (Medical): Not on file  . Lack of Transportation (Non-Medical): Not on file  Physical Activity:   . Days of Exercise per Week: Not on file  . Minutes of Exercise per Session: Not on file  Stress:   . Feeling of Stress : Not on file  Social Connections:   .  Frequency of Communication with Friends and Family: Not on file  . Frequency of Social Gatherings with Friends and Family: Not on file  . Attends Religious Services: Not on file  . Active Member of Clubs or Organizations: Not on file  . Attends Banker Meetings: Not on file  . Marital Status: Not on file   Allergies  Allergen Reactions  . Hydrocodone-Acetaminophen Nausea And Vomiting  . Tramadol Itching   Family History  Problem Relation Age of Onset  . Hypertension Mother   . Diabetes Mother   . Cancer Mother        in leg  . Varicose Veins Maternal Uncle   . Colon cancer Neg Hx   . Colon polyps Neg Hx   .  Esophageal cancer Neg Hx   . Stomach cancer Neg Hx   . Rectal cancer Neg Hx     Current Outpatient Medications (Endocrine & Metabolic):  .  estradiol (ESTRACE) 0.5 MG tablet, Take 0.5 mg by mouth daily.  .  predniSONE (DELTASONE) 20 MG tablet, Take 2 tablets (40 mg total) by mouth daily with breakfast.  Current Outpatient Medications (Cardiovascular):  .  spironolactone (ALDACTONE) 25 MG tablet, TAKE 1 TABLET BY MOUTH TWICE DAILY  Current Outpatient Medications (Respiratory):  .  cetirizine (ZYRTEC) 10 MG tablet, TAKE 1 TABLET BY MOUTH EVERY DAY .  fluticasone (FLONASE) 50 MCG/ACT nasal spray, PLACE 2 SPRAYS INTO BOTH NOSTRILS DAILY. SHAKE LIQUID AND USE 2 SPRAYS IN EACH NOSTRIL DAILY  Current Outpatient Medications (Analgesics):  .  acetaminophen (TYLENOL) 500 MG tablet, Take 500 mg by mouth every 6 (six) hours as needed. Marland Kitchen  aspirin 81 MG tablet, Take 81 mg by mouth daily.   .  meloxicam (MOBIC) 15 MG tablet, TAKE 1 TABLET BY MOUTH EVERY DAY   Current Outpatient Medications (Other):  .  calcium-vitamin D (OSCAL WITH D) 500-200 MG-UNIT per tablet, Take 1 tablet by mouth daily with breakfast.  .  Cholecalciferol (VITAMIN D3) 50 MCG (2000 UT) TABS, Take by mouth daily. .  cyclobenzaprine (FLEXERIL) 5 MG tablet, Take 1 tablet (5 mg total) by mouth 3 (three) times daily as needed for muscle spasms. .  Diclofenac Sodium (PENNSAID) 2 % SOLN, Place onto the skin. Marland Kitchen  diclofenac sodium (VOLTAREN) 1 % GEL, Apply 2 g topically 4 (four) times daily. .  famotidine (PEPCID) 20 MG tablet, Take 1 tablet (20 mg total) by mouth daily. Marland Kitchen  gabapentin (NEURONTIN) 100 MG capsule, Take 1 capsule (100 mg total) by mouth at bedtime. Marland Kitchen  latanoprost (XALATAN) 0.005 % ophthalmic solution, Place 1 drop into both eyes at bedtime. .  metroNIDAZOLE (FLAGYL) 250 MG tablet, Take 250 mg by mouth as needed. Marland Kitchen  omeprazole (PRILOSEC) 40 MG capsule, TAKE 1 CAPSULE BY MOUTH TWICE DAILY 30 MINUTES BEFORE FIRST AND LAST MEAL  OF THE DAY .  ondansetron (ZOFRAN) 4 MG tablet, Take 4 mg by mouth every 8 (eight) hours as needed for nausea or vomiting (Take 1 tablet by mouth every eight hours as needed for nausea). Marland Kitchen  OVER THE COUNTER MEDICATION, Go- [facility] daily for joints .  potassium chloride SA (K-DUR,KLOR-CON) 20 MEQ tablet, TAKE 1 TABLET(20 MEQ) BY MOUTH DAILY .  prednisoLONE acetate (PRED MILD) 0.12 % ophthalmic suspension, 1 drop as needed. Pt uses as needed .  triamcinolone cream (KENALOG) 0.1 %, Apply 1 application topically 2 (two) times daily. .  Turmeric (CURCUMIN 95) 500 MG CAPS, Take by mouth.    Past  medical history, social, surgical and family history all reviewed in electronic medical record.  No pertanent information unless stated regarding to the chief complaint.   Review of Systems:  No headache, visual changes, nausea, vomiting, diarrhea, constipation, dizziness, abdominal pain, skin rash, fevers, chills, night sweats, weight loss, swollen lymph nodes, body aches, joint swelling, muscle aches, chest pain, shortness of breath, mood changes.   Objective  Blood pressure 130/82, pulse 72, height 5\' 1"  (1.549 m), weight 193 lb (87.5 kg), SpO2 99 %.    General: No apparent distress alert and oriented x3 mood and affect normal, dressed appropriately.  HEENT: Pupils equal, extraocular movements intact  Respiratory: Patient's speak in full sentences and does not appear short of breath  Cardiovascular: 1+ lower extremity edema, non tender, no erythema  Skin: Warm dry intact with no signs of infection or rash on extremities or on axial skeleton.  Neuro: Cranial nerves II through XII are intact, neurovascularly intact in all extremities with 2+ DTRs and 2+ pulses.  Gait severely antalgic MSK:   Knee: Right valgus deformity noted.  Abnormal thigh to calf ratio.  Tender to palpation over medial and PF joint line.  ROM full in flexion and extension and lower leg rotation. instability with valgus force.    painful patellar compression. Patellar glide with moderate crepitus. Patellar and quadriceps tendons unremarkable. Hamstring and quadriceps strength is normal. Contralateral knee shows arthritic changes with mild instability as well  Deferred back exam today.  After informed written and verbal consent, patient was seated on exam table. Right knee was prepped with alcohol swab and utilizing anterolateral approach, patient's right knee space was injected with 4:1  marcaine 0.5%: Kenalog 40mg /dL. Patient tolerated the procedure well without immediate complications.    Impression and Recommendations:     This case required medical decision making of moderate complexity. The above documentation has been reviewed and is accurate and complete Catherine Pulley, DO       Note: This dictation was prepared with Dragon dictation along with smaller phrase technology. Any transcriptional errors that result from this process are unintentional.

## 2019-06-21 NOTE — Patient Instructions (Signed)
We are here if you need [place] Can repeat injections every 10 weeks but do feel replacement is needed

## 2019-06-21 NOTE — Assessment & Plan Note (Addendum)
injection given today.  Return to help patient with some pain short-term.  Patient is following up with the surgeon in the near future to discuss possible surgical replacement of the knee.  No other change in management follow-up again in 10 weeks patient has been written out of work for the next month until she sees another provider to have surgical intervention

## 2019-07-01 ENCOUNTER — Other Ambulatory Visit: Payer: Self-pay | Admitting: Internal Medicine

## 2019-07-05 DIAGNOSIS — M170 Bilateral primary osteoarthritis of knee: Secondary | ICD-10-CM | POA: Diagnosis not present

## 2019-07-13 DIAGNOSIS — H401121 Primary open-angle glaucoma, left eye, mild stage: Secondary | ICD-10-CM | POA: Diagnosis not present

## 2019-07-13 DIAGNOSIS — H401113 Primary open-angle glaucoma, right eye, severe stage: Secondary | ICD-10-CM | POA: Diagnosis not present

## 2019-07-19 DIAGNOSIS — Z01419 Encounter for gynecological examination (general) (routine) without abnormal findings: Secondary | ICD-10-CM | POA: Diagnosis not present

## 2019-07-27 ENCOUNTER — Ambulatory Visit: Payer: BC Managed Care – PPO | Admitting: Internal Medicine

## 2019-08-01 ENCOUNTER — Other Ambulatory Visit: Payer: Self-pay

## 2019-08-01 ENCOUNTER — Ambulatory Visit (INDEPENDENT_AMBULATORY_CARE_PROVIDER_SITE_OTHER): Payer: BC Managed Care – PPO | Admitting: Internal Medicine

## 2019-08-01 ENCOUNTER — Encounter: Payer: Self-pay | Admitting: Internal Medicine

## 2019-08-01 VITALS — BP 124/82 | HR 65 | Temp 98.3°F | Ht 61.0 in | Wt 191.0 lb

## 2019-08-01 DIAGNOSIS — Z01810 Encounter for preprocedural cardiovascular examination: Secondary | ICD-10-CM | POA: Insufficient documentation

## 2019-08-01 DIAGNOSIS — M1711 Unilateral primary osteoarthritis, right knee: Secondary | ICD-10-CM | POA: Diagnosis not present

## 2019-08-01 DIAGNOSIS — J4520 Mild intermittent asthma, uncomplicated: Secondary | ICD-10-CM | POA: Diagnosis not present

## 2019-08-01 LAB — HEMOGLOBIN A1C: Hgb A1c MFr Bld: 6 % (ref 4.6–6.5)

## 2019-08-01 LAB — COMPREHENSIVE METABOLIC PANEL
ALT: 9 U/L (ref 0–35)
AST: 11 U/L (ref 0–37)
Albumin: 4.1 g/dL (ref 3.5–5.2)
Alkaline Phosphatase: 71 U/L (ref 39–117)
BUN: 18 mg/dL (ref 6–23)
CO2: 23 mEq/L (ref 19–32)
Calcium: 9.7 mg/dL (ref 8.4–10.5)
Chloride: 108 mEq/L (ref 96–112)
Creatinine, Ser: 1.14 mg/dL (ref 0.40–1.20)
GFR: 55.71 mL/min — ABNORMAL LOW (ref >60.00)
Glucose, Bld: 109 mg/dL — ABNORMAL HIGH (ref 70–99)
Potassium: 4.1 mEq/L (ref 3.5–5.1)
Sodium: 140 mEq/L (ref 135–145)
Total Bilirubin: 0.5 mg/dL (ref 0.2–1.2)
Total Protein: 6.8 g/dL (ref 6.0–8.3)

## 2019-08-01 NOTE — Patient Instructions (Signed)
Your EKG looks the same as from 2019 so no problems. We will check the labs today and you should be fine to have the knee replacement.

## 2019-08-01 NOTE — Assessment & Plan Note (Signed)
BP at goal, EKG done today without significant changes from 2019. Checking CMP today and HgA1c. If stable, no further CV testing needed prior to knee replacement will be low risk.

## 2019-08-01 NOTE — Assessment & Plan Note (Signed)
Stable at this time. Uses albuterol prn flare.

## 2019-08-01 NOTE — Assessment & Plan Note (Signed)
Needs knee replacement soon and will get clearance today.

## 2019-08-01 NOTE — Progress Notes (Signed)
   Subjective:   Patient ID: Catherine Hoffman, female    DOB: Oct 09, 1940, 79 y.o.   MRN: [id number]  HPI The patient is a 79 YO female coming in for pre-op clearance for knee replacement. Hoping to have this about 1 month from now. Right knee is causing her a lot more pain and limiting activity. She can walk up a flight of stairs without stopping. Denies chest pains or SOB. Has asthma but well controlled. Non-smoker. Denies other changes in health.   PMH, [facility], social history reviewed and updated  Review of Systems  Constitutional: Positive for activity change.  HENT: Negative.   Eyes: Negative.   Respiratory: Negative for cough, chest tightness and shortness of breath.   Cardiovascular: Negative for chest pain, palpitations and leg swelling.  Gastrointestinal: Negative for abdominal distention, abdominal pain, constipation, diarrhea, nausea and vomiting.  Musculoskeletal: Positive for arthralgias and myalgias.  Skin: Negative.   Neurological: Negative.   Psychiatric/Behavioral: Negative.     Objective:  Physical Exam Constitutional:      Appearance: She is well-developed.  HENT:     Head: Normocephalic and atraumatic.  Cardiovascular:     Rate and Rhythm: Normal rate and regular rhythm.  Pulmonary:     Effort: Pulmonary effort is normal. No respiratory distress.     Breath sounds: Normal breath sounds. No wheezing or rales.  Abdominal:     General: Bowel sounds are normal. There is no distension.     Palpations: Abdomen is soft.     Tenderness: There is no abdominal tenderness. There is no rebound.  Musculoskeletal:        General: Tenderness present.     Cervical back: Normal range of motion.  Skin:    General: Skin is warm and dry.  Neurological:     Mental Status: She is alert and oriented to person, place, and time.     Coordination: Coordination normal.     Vitals:   08/01/19 0753  BP: 124/82  Pulse: 65  Temp: 98.3 F (36.8 C)  TempSrc: Oral  SpO2: 96%    Weight: 191 lb (86.6 kg)  Height: 5\' 1"  (1.549 m)   EKG: Rate 51, axis normal, interval normal, sinus, no st or t wave changes, no significant change when compared to 2019 EKG  This visit occurred during the SARS-CoV-2 public health emergency.  Safety protocols were in place, including screening questions prior to the visit, additional usage of staff PPE, and extensive cleaning of exam room while observing appropriate contact time as indicated for disinfecting solutions.   Assessment & Plan:

## 2019-08-10 ENCOUNTER — Other Ambulatory Visit: Payer: Self-pay | Admitting: Orthopaedic Surgery

## 2019-08-11 ENCOUNTER — Telehealth: Payer: Self-pay

## 2019-08-11 ENCOUNTER — Other Ambulatory Visit: Payer: Self-pay | Admitting: Internal Medicine

## 2019-08-11 NOTE — Telephone Encounter (Signed)
[facility] surgical clearance form has been received, completed, and faxed back.  Original sent to scan.

## 2019-08-28 NOTE — Patient Instructions (Addendum)
DUE TO COVID-19 ONLY ONE VISITOR IS ALLOWED TO COME WITH YOU AND STAY IN THE WAITING ROOM ONLY DURING PRE OP AND PROCEDURE DAY OF SURGERY. THE 1 VISITOR MAY VISIT WITH YOU AFTER SURGERY IN YOUR PRIVATE ROOM DURING VISITING HOURS ONLY!  YOU NEED TO HAVE A COVID 19 TEST ON 09-01-19 @ 9:00 AM, THIS TEST MUST BE DONE BEFORE SURGERY, COME  [address], [place] , [id number].  ([place]) ONCE YOUR COVID TEST IS COMPLETED, PLEASE BEGIN THE QUARANTINE INSTRUCTIONS AS OUTLINED IN YOUR HANDOUT.                Catherine Hoffman  08/28/2019   Your procedure is scheduled on: 09-05-19   Report to [facility] Main  Entrance    Report to Admitting at 7:20 AM     Call this number if you have problems the morning of surgery [phone]    Remember: NO SOLID FOOD AFTER MIDNIGHT THE NIGHT PRIOR TO SURGERY. NOTHING BY MOUTH EXCEPT CLEAR LIQUIDS UNTIL 6:50 AM . PLEASE FINISH ENSURE DRINK PER SURGEON ORDER  WHICH NEEDS TO BE COMPLETED AT 6:50 AM.   CLEAR LIQUID DIET   Foods Allowed                                                                     Foods Excluded  Coffee and tea, regular and decaf                             liquids that you cannot  Plain Jell-O any favor except red or purple                                           see through such as: Fruit ices (not with fruit pulp)                                     milk, soups, orange juice  Iced Popsicles                                    All solid food Carbonated beverages, regular and diet                                    Cranberry, grape and apple juices Sports drinks like Gatorade Lightly seasoned clear broth or consume(fat free) Sugar, honey syrup   _____________________________________________________________________     Take these medicines the morning of surgery with A SIP OF WATER: Famotidine (Pepcid), Omeprazole (Prilosec), Cetrizine (Zyrtec). You may also use your eyedrops  BRUSH YOUR TEETH  MORNING OF SURGERY AND RINSE YOUR MOUTH OUT, NO CHEWING GUM CANDY OR MINTS.                                  You  may not have any metal on your body including hair pins and              piercings      Do not wear jewelry, make-up, lotions, powders or perfumes, deodorant              Do not wear nail polish on your fingernails.  Do not shave  48 hours prior to surgery.                 Do not bring valuables to the hospital.  IS NOT             RESPONSIBLE   FOR VALUABLES.  Contacts, dentures or bridgework may not be worn into surgery.  You may bring an overnight bag     Special Instructions: N/A              Please read over the following fact sheets you were given: _____________________________________________________________________             [facility] - Preparing for Surgery Before surgery, you can play an important role.  Because skin is not sterile, your skin needs to be as free of germs as possible.  You can reduce the number of germs on your skin by washing with CHG (chlorahexidine gluconate) soap before surgery.  CHG is an antiseptic cleaner which kills germs and bonds with the skin to continue killing germs even after washing. Please DO NOT use if you have an allergy to CHG or antibacterial soaps.  If your skin becomes reddened/irritated stop using the CHG and inform your nurse when you arrive at Short Stay. Do not shave (including legs and underarms) for at least 48 hours prior to the first CHG shower.  You may shave your face/neck. Please follow these instructions carefully:  1.  Shower with CHG Soap the night before surgery and the  morning of Surgery.  2.  If you choose to wash your hair, wash your hair first as usual with your  normal  shampoo.  3.  After you shampoo, rinse your hair and body thoroughly to remove the  shampoo.                           4.  Use CHG as you would any other liquid soap.  You can apply chg directly  to the skin and wash                        Gently with a scrungie or clean washcloth.  5.  Apply the CHG Soap to your body ONLY FROM THE NECK DOWN.   Do not use on face/ open                           Wound or open sores. Avoid contact with eyes, ears mouth and genitals (private parts).                       Wash face,  Genitals (private parts) with your normal soap.             6.  Wash thoroughly, paying special attention to the area where your surgery  will be performed.  7.  Thoroughly rinse your body with warm water from the neck down.  8.  DO NOT shower/wash with your normal soap after using and rinsing off  the CHG Soap.                9.  Pat yourself dry with a clean towel.            10.  Wear clean pajamas.            11.  Place clean sheets on your bed the night of your first shower and do not  sleep with pets. Day of Surgery : Do not apply any lotions/deodorants the morning of surgery.  Please wear clean clothes to the hospital/surgery center.  FAILURE TO FOLLOW THESE INSTRUCTIONS MAY RESULT IN THE CANCELLATION OF YOUR SURGERY PATIENT SIGNATURE_________________________________  NURSE SIGNATURE__________________________________  ________________________________________________________________________   Adam Phenix  An incentive spirometer is a tool that can help keep your lungs clear and active. This tool measures how well you are filling your lungs with each breath. Taking long deep breaths may help reverse or decrease the chance of developing breathing (pulmonary) problems (especially infection) following:  A long period of time when you are unable to move or be active. BEFORE THE PROCEDURE   If the spirometer includes an indicator to show your best effort, your nurse or respiratory therapist will set it to a desired goal.  If possible, sit up straight or lean slightly forward. Try not to slouch.  Hold the incentive spirometer in an upright position. INSTRUCTIONS FOR USE  1. Sit on the  edge of your bed if possible, or sit up as far as you can in bed or on a chair. 2. Hold the incentive spirometer in an upright position. 3. Breathe out normally. 4. Place the mouthpiece in your mouth and seal your lips tightly around it. 5. Breathe in slowly and as deeply as possible, raising the piston or the ball toward the top of the column. 6. Hold your breath for 3-5 seconds or for as long as possible. Allow the piston or ball to fall to the bottom of the column. 7. Remove the mouthpiece from your mouth and breathe out normally. 8. Rest for a few seconds and repeat Steps 1 through 7 at least 10 times every 1-2 hours when you are awake. Take your time and take a few normal breaths between deep breaths. 9. The spirometer may include an indicator to show your best effort. Use the indicator as a goal to work toward during each repetition. 10. After each set of 10 deep breaths, practice coughing to be sure your lungs are clear. If you have an incision (the cut made at the time of surgery), support your incision when coughing by placing a pillow or rolled up towels firmly against it. Once you are able to get out of bed, walk around indoors and cough well. You may stop using the incentive spirometer when instructed by your caregiver.  RISKS AND COMPLICATIONS  Take your time so you do not get dizzy or light-headed.  If you are in pain, you may need to take or ask for pain medication before doing incentive spirometry. It is harder to take a deep breath if you are having pain. AFTER USE  Rest and breathe slowly and easily.  It can be helpful to keep track of a log of your progress. Your caregiver can provide you with a simple table to help with this. If you are using the spirometer at home, follow these instructions: [place] IF:   You are having difficultly using the spirometer.  You have trouble using the spirometer as often  as instructed.  Your pain medication is not giving enough  relief while using the spirometer.  You develop fever of 100.5 F (38.1 C) or higher. SEEK IMMEDIATE MEDICAL CARE IF:   You cough up bloody sputum that had not been present before.  You develop fever of 102 F (38.9 C) or greater.  You develop worsening pain at or near the incision site. MAKE SURE YOU:   Understand these instructions.  Will watch your condition.  Will get help right away if you are not doing well or get worse. Document Released: 10/05/2006 Document Revised: 08/17/2011 Document Reviewed: 12/06/2006 ExitCare Patient Information 2014 ExitCare, [place].   ________________________________________________________________________  WHAT IS A BLOOD TRANSFUSION? Blood Transfusion Information  A transfusion is the replacement of blood or some of its parts. Blood is made up of multiple cells which provide different functions.  Red blood cells carry oxygen and are used for blood loss replacement.  White blood cells fight against infection.  Platelets control bleeding.  Plasma helps clot blood.  Other blood products are available for specialized needs, such as hemophilia or other clotting disorders. BEFORE THE TRANSFUSION  Who gives blood for transfusions?   Healthy volunteers who are fully evaluated to make sure their blood is safe. This is blood bank blood. Transfusion therapy is the safest it has ever been in the practice of medicine. Before blood is taken from a donor, a complete history is taken to make sure that person has no history of diseases nor engages in risky social behavior (examples are intravenous drug use or sexual activity with multiple partners). The donor's travel history is screened to minimize risk of transmitting infections, such as malaria. The donated blood is tested for signs of infectious diseases, such as HIV and hepatitis. The blood is then tested to be sure it is compatible with you in order to minimize the chance of a transfusion reaction. If  you or a relative donates blood, this is often done in anticipation of surgery and is not appropriate for emergency situations. It takes many days to process the donated blood. RISKS AND COMPLICATIONS Although transfusion therapy is very safe and saves many lives, the main dangers of transfusion include:   Getting an infectious disease.  Developing a transfusion reaction. This is an allergic reaction to something in the blood you were given. Every precaution is taken to prevent this. The decision to have a blood transfusion has been considered carefully by your caregiver before blood is given. Blood is not given unless the benefits outweigh the risks. AFTER THE TRANSFUSION  Right after receiving a blood transfusion, you will usually feel much better and more energetic. This is especially true if your red blood cells have gotten low (anemic). The transfusion raises the level of the red blood cells which carry oxygen, and this usually causes an energy increase.  The nurse administering the transfusion will monitor you carefully for complications. HOME CARE INSTRUCTIONS  No special instructions are needed after a transfusion. You may find your energy is better. Speak with your caregiver about any limitations on activity for underlying diseases you may have. SEEK MEDICAL CARE IF:   Your condition is not improving after your transfusion.  You develop redness or irritation at the intravenous (IV) site. SEEK IMMEDIATE MEDICAL CARE IF:  Any of the following symptoms occur over the next 12 hours:  Shaking chills.  You have a temperature by mouth above 102 F (38.9 C), not controlled by medicine.  Chest, back, or muscle  pain.  People around you feel you are not acting correctly or are confused.  Shortness of breath or difficulty breathing.  Dizziness and fainting.  You get a rash or develop hives.  You have a decrease in urine output.  Your urine turns a dark color or changes to pink,  red, or brown. Any of the following symptoms occur over the next 10 days:  You have a temperature by mouth above 102 F (38.9 C), not controlled by medicine.  Shortness of breath.  Weakness after normal activity.  The white part of the eye turns yellow (jaundice).  You have a decrease in the amount of urine or are urinating less often.  Your urine turns a dark color or changes to pink, red, or brown. Document Released: 05/22/2000 Document Revised: 08/17/2011 Document Reviewed: 01/09/2008 [facility] Patient Information 2014 [place], [place].  _______________________________________________________________________

## 2019-08-28 NOTE — H&P (Signed)
TOTAL KNEE ADMISSION H&P  Patient is being admitted for right total knee arthroplasty.  Subjective:  Chief Complaint:right knee pain.  HPI: Catherine Hoffman, 79 y.o. female, has a history of pain and functional disability in the right knee due to arthritis and has failed non-surgical conservative treatments for greater than 12 weeks to includeNSAID's and/or analgesics, corticosteriod injections, viscosupplementation injections, flexibility and strengthening excercises, use of assistive devices, weight reduction as appropriate and activity modification.  Onset of symptoms was gradual, starting 5 years ago with gradually worsening course since that time. The patient noted no past surgery on the right knee(s).  Patient currently rates pain in the right knee(s) at 10 out of 10 with activity. Patient has night pain, worsening of pain with activity and weight bearing, pain that interferes with activities of daily living, crepitus and joint swelling.  Patient has evidence of subchondral cysts, subchondral sclerosis, periarticular osteophytes and joint space narrowing by imaging studies. There is no active infection.  Patient Active Problem List   Diagnosis Date Noted  . Pre-operative cardiovascular examination 08/01/2019  . DDD (degenerative disc disease), lumbar 06/28/2018  . Venous insufficiency 10/21/2016  . Routine general medical examination at a health care facility 02/27/2016  . Asthma, mild intermittent 07/19/2015  . Arthritis of knee, degenerative 11/17/2011  . Essential hypertension 09/28/2010  . RENAL CALCULUS 08/08/2010  . Hyperlipidemia 02/25/2009  . Low back pain 10/04/2008  . Chronic allergic rhinitis 09/21/2008  . Obesity 06/30/2007  . ACID REFLUX DISEASE 06/30/2007   Past Medical History:  Diagnosis Date  . Arthritis   . Cataract    removed both eyes   . Episodic tension type headache   . Esophageal reflux   . GERD (gastroesophageal reflux disease)   . Glaucoma   . Heart  murmur    as young adult  . Hyperlipidemia   . Hypertension   . Internal hemorrhoids   . Kidney stone on left side Hx-date unknown  . Left rotator cuff tear     Past Surgical History:  Procedure Laterality Date  . COLONOSCOPY  11/20/2008  . COLONOSCOPY    . KNEE ARTHROSCOPY Left 2004  . SHOULDER OPEN ROTATOR CUFF REPAIR Left 08/30/2012   Procedure: ROTATOR CUFF REPAIR SHOULDER OPEN, POSSIBLE GRAFT AND ANCHORS;  Surgeon: Jacki Cones, MD;  Location: [facility];  Service: Orthopedics;  Laterality: Left;  . TOTAL ABDOMINAL HYSTERECTOMY W/ BILATERAL SALPINGOOPHORECTOMY  1990's    No current facility-administered medications for this encounter.   Current Outpatient Medications  Medication Sig Dispense Refill Last Dose  . acetaminophen (TYLENOL) 500 MG tablet Take 1,000 mg by mouth every 6 (six) hours as needed for moderate pain.      Marland Kitchen aspirin 81 MG tablet Take 81 mg by mouth daily.       . calcium-vitamin D (OSCAL WITH D) 500-200 MG-UNIT per tablet Take 1 tablet by mouth daily with breakfast.      . cetirizine (ZYRTEC) 10 MG tablet TAKE 1 TABLET BY MOUTH EVERY DAY (Patient taking differently: Take 10 mg by mouth daily. ) 90 tablet 0   . Cholecalciferol (VITAMIN D3) 50 MCG (2000 UT) TABS Take 2,000 Units by mouth daily.      . cyclobenzaprine (FLEXERIL) 5 MG tablet Take 1 tablet (5 mg total) by mouth 3 (three) times daily as needed for muscle spasms. 60 tablet 0   . estradiol (ESTRACE) 0.5 MG tablet Take 0.5 mg by mouth every Monday, Wednesday, and Friday.   11   .  fluticasone (FLONASE) 50 MCG/ACT nasal spray PLACE 2 SPRAYS INTO BOTH NOSTRILS DAILY. SHAKE LIQUID AND USE 2 SPRAYS IN EACH NOSTRIL DAILY (Patient taking differently: Place 2 sprays into both nostrils daily. ) 48 mL 1   . gabapentin (NEURONTIN) 100 MG capsule Take 1 capsule (100 mg total) by mouth at bedtime. 30 capsule 3   . latanoprost (XALATAN) 0.005 % ophthalmic solution Place 1 drop into both eyes at  bedtime.     Marland Kitchen omeprazole (PRILOSEC) 40 MG capsule TAKE 1 CAPSULE BY MOUTH TWICE DAILY 30 MINUTES BEFORE FIRST AND LAST MEAL OF THE DAY (Patient taking differently: Take 40 mg by mouth 2 (two) times daily before a meal. ) 60 capsule 5   . potassium chloride SA (KLOR-CON M20) 20 MEQ tablet TAKE 1 TABLET(20 MEQ) BY MOUTH DAILY (Patient taking differently: Take 20 mEq by mouth daily. ) 30 tablet 5   . spironolactone (ALDACTONE) 25 MG tablet TAKE 1 TABLET BY MOUTH TWICE A DAY (Patient taking differently: Take 25 mg by mouth 2 (two) times daily. ) 60 tablet 5   . diclofenac sodium (VOLTAREN) 1 % GEL Apply 2 g topically 4 (four) times daily. (Patient not taking: Reported on 08/24/2019) 100 g 6 Not Taking at Unknown time  . famotidine (PEPCID) 20 MG tablet Take 1 tablet (20 mg total) by mouth daily. (Patient not taking: Reported on 08/24/2019) 14 tablet 0 Not Taking at Unknown time  . meloxicam (MOBIC) 15 MG tablet TAKE 1 TABLET BY MOUTH EVERY DAY (Patient not taking: Reported on 08/24/2019) 30 tablet 5 Not Taking at Unknown time  . predniSONE (DELTASONE) 20 MG tablet Take 2 tablets (40 mg total) by mouth daily with breakfast. (Patient not taking: Reported on 08/24/2019) 10 tablet 0 Not Taking at Unknown time  . triamcinolone cream (KENALOG) 0.1 % Apply 1 application topically 2 (two) times daily. (Patient not taking: Reported on 08/24/2019) 100 g 0 Not Taking at Unknown time   Allergies  Allergen Reactions  . Hydrocodone-Acetaminophen Nausea And Vomiting  . Tramadol Itching    Social History   Tobacco Use  . Smoking status: Never Smoker  . Smokeless tobacco: Never Used  Substance Use Topics  . Alcohol use: No    Family History  Problem Relation Age of Onset  . Hypertension Mother   . Diabetes Mother   . Cancer Mother        in leg  . Varicose Veins Maternal Uncle   . Colon cancer Neg Hx   . Colon polyps Neg Hx   . Esophageal cancer Neg Hx   . Stomach cancer Neg Hx   . Rectal cancer Neg Hx       Review of Systems  Musculoskeletal: Positive for arthralgias.       Right knee  All other systems reviewed and are negative.   Objective:  Physical Exam  Constitutional: She is oriented to person, place, and time. She appears well-developed and well-nourished.  HENT:  Head: Normocephalic and atraumatic.  Eyes: Pupils are equal, round, and reactive to light.  Cardiovascular: Normal rate and regular rhythm.  Respiratory: Effort normal.  GI: Soft.  Musculoskeletal:     Cervical back: Normal range of motion.     Comments: Examination of the right knee shows range of motion from 5-80.  She has a mild varus deformity.  She has diffuse tender palpation along her joint line.  Her ligaments are stable.  Her calf is soft and nontender.  Right hip range of  motion is full without pain.  She is neurovascularly intact distally.  Examination of the left knee shows range of motion from about 5-100 of flexion.  1+ crepitation.  No effusion.  She has some tenderness palpation mostly medially.  Her ligaments are stable.  Her calf is soft and nontender.  Left hip range of motion is full and without pain.  She is neurovascularly intact distally.    Neurological: She is alert and oriented to person, place, and time.  Skin: Skin is warm and dry.  Psychiatric: She has a normal mood and affect. Her behavior is normal. Judgment and thought content normal.    Vital signs in last 24 hours: BP: ()/()  Arterial Line BP: ()/()   Labs:   Estimated body mass index is 36.09 kg/m as calculated from the following:   Height as of 08/01/19: 5\' 1"  (1.549 m).   Weight as of 08/01/19: 86.6 kg.   Imaging Review Plain radiographs demonstrate severe degenerative joint disease of the right knee(s). The overall alignment isneutral. The bone quality appears to be good for age and reported activity level.      Assessment/Plan:  End stage primary arthritis, right knee   The patient history, physical examination,  clinical judgment of the provider and imaging studies are consistent with end stage degenerative joint disease of the right knee(s) and total knee arthroplasty is deemed medically necessary. The treatment options including medical management, injection therapy arthroscopy and arthroplasty were discussed at length. The risks and benefits of total knee arthroplasty were presented and reviewed. The risks due to aseptic loosening, infection, stiffness, patella tracking problems, thromboembolic complications and other imponderables were discussed. The patient acknowledged the explanation, agreed to proceed with the plan and consent was signed. Patient is being admitted for inpatient treatment for surgery, pain control, PT, OT, prophylactic antibiotics, VTE prophylaxis, progressive ambulation and ADL's and discharge planning. The patient is planning to be discharged home with home health services     Patient's anticipated LOS is less than 2 midnights, meeting these requirements: - Younger than 51 - Lives within 1 hour of care - Has a competent adult at home to recover with post-op recover - NO history of  - Chronic pain requiring opiods  - Diabetes  - Coronary Artery Disease  - Heart failure  - Heart attack  - Stroke  - DVT/VTE  - Cardiac arrhythmia  - Respiratory Failure/COPD  - Renal failure  - Anemia  - Advanced Liver disease

## 2019-08-28 NOTE — Progress Notes (Signed)
PCP - Hillard Danker, MD w/surgical clearance dated 08/01/19. Cardiologist -   Chest x-ray -  EKG - 08-01-19 Stress Test -  ECHO -  Cardiac Cath -   Sleep Study -  CPAP -   Fasting Blood Sugar -  Checks Blood Sugar _____ times a day  Blood Thinner Instructions: Aspirin Instructions: Last Dose:  Anesthesia review:   Patient denies shortness of breath, fever, cough and chest pain at PAT appointment   Patient verbalized understanding of instructions that were given to them at the PAT appointment. Patient was also instructed that they will need to review over the PAT instructions again at home before surgery.

## 2019-08-29 ENCOUNTER — Ambulatory Visit (HOSPITAL_COMMUNITY)
Admission: RE | Admit: 2019-08-29 | Discharge: 2019-08-29 | Disposition: A | Discharge status: Home / Self Care | Payer: BC Managed Care – PPO | Source: Ambulatory Visit | Attending: Orthopaedic Surgery | Admitting: Orthopaedic Surgery

## 2019-08-29 ENCOUNTER — Encounter (HOSPITAL_COMMUNITY)
Admission: RE | Admit: 2019-08-29 | Discharge: 2019-08-29 | Disposition: A | Discharge status: Home / Self Care | Payer: BC Managed Care – PPO | Source: Ambulatory Visit | Attending: Orthopaedic Surgery | Admitting: Orthopaedic Surgery

## 2019-08-29 ENCOUNTER — Encounter (HOSPITAL_COMMUNITY): Payer: Self-pay

## 2019-08-29 ENCOUNTER — Other Ambulatory Visit: Payer: Self-pay

## 2019-08-29 DIAGNOSIS — Z01818 Encounter for other preprocedural examination: Secondary | ICD-10-CM | POA: Insufficient documentation

## 2019-08-29 LAB — BASIC METABOLIC PANEL
Anion gap: 12 (ref 5–15)
BUN: 20 mg/dL (ref 8–23)
CO2: 23 mmol/L (ref 22–32)
Calcium: 9.3 mg/dL (ref 8.9–10.3)
Chloride: 105 mmol/L (ref 98–111)
Creatinine, Ser: 1.11 mg/dL — ABNORMAL HIGH (ref 0.44–1.00)
GFR calc Af Amer: 55 mL/min — ABNORMAL LOW (ref >60)
GFR calc non Af Amer: 48 mL/min — ABNORMAL LOW (ref >60)
Glucose, Bld: 129 mg/dL — ABNORMAL HIGH (ref 70–99)
Potassium: 4 mmol/L (ref 3.5–5.1)
Sodium: 140 mmol/L (ref 135–145)

## 2019-08-29 LAB — CBC WITH DIFFERENTIAL/PLATELET
Abs Immature Granulocytes: 0.01 10*3/uL (ref 0.00–0.07)
Basophils Absolute: 0 10*3/uL (ref 0.0–0.1)
Basophils Relative: 0 %
Eosinophils Absolute: 0.2 10*3/uL (ref 0.0–0.5)
Eosinophils Relative: 3 %
HCT: 39.7 % (ref 36.0–46.0)
Hemoglobin: 12.5 g/dL (ref 12.0–15.0)
Immature Granulocytes: 0 %
Lymphocytes Relative: 41 %
Lymphs Abs: 2.4 10*3/uL (ref 0.7–4.0)
MCH: 31 pg (ref 26.0–34.0)
MCHC: 31.5 g/dL (ref 30.0–36.0)
MCV: 98.5 fL (ref 80.0–100.0)
Monocytes Absolute: 0.5 10*3/uL (ref 0.1–1.0)
Monocytes Relative: 8 %
Neutro Abs: 2.9 10*3/uL (ref 1.7–7.7)
Neutrophils Relative %: 48 %
Platelets: 206 10*3/uL (ref 150–400)
RBC: 4.03 MIL/uL (ref 3.87–5.11)
RDW: 12 % (ref 11.5–15.5)
WBC: 5.9 10*3/uL (ref 4.0–10.5)
nRBC: 0 % (ref 0.0–0.2)

## 2019-08-29 LAB — SURGICAL PCR SCREEN
MRSA, PCR: NEGATIVE
Staphylococcus aureus: NEGATIVE

## 2019-08-29 LAB — URINALYSIS, ROUTINE W REFLEX MICROSCOPIC
Bilirubin Urine: NEGATIVE
Glucose, UA: NEGATIVE mg/dL
Hgb urine dipstick: NEGATIVE
Ketones, ur: NEGATIVE mg/dL
Leukocytes,Ua: NEGATIVE
Nitrite: NEGATIVE
Protein, ur: NEGATIVE mg/dL
Specific Gravity, Urine: 1.017 (ref 1.005–1.030)
pH: 5 (ref 5.0–8.0)

## 2019-08-29 LAB — PROTIME-INR
INR: 0.9 (ref 0.8–1.2)
Prothrombin Time: 12.4 seconds (ref 11.4–15.2)

## 2019-08-29 LAB — APTT: aPTT: 30 seconds (ref 24–36)

## 2019-08-30 NOTE — Progress Notes (Signed)
Per [place] in Lab, pt's T&S was positive for antibodies. Lab needs to be recollected on day of surgery.Marland KitchenMarland KitchenOrder placed.

## 2019-09-01 ENCOUNTER — Other Ambulatory Visit (HOSPITAL_COMMUNITY)
Admission: RE | Admit: 2019-09-01 | Discharge: 2019-09-01 | Disposition: A | Discharge status: Home / Self Care | Payer: BC Managed Care – PPO | Source: Ambulatory Visit | Attending: Orthopaedic Surgery | Admitting: Orthopaedic Surgery

## 2019-09-01 DIAGNOSIS — Z20822 Contact with and (suspected) exposure to covid-19: Secondary | ICD-10-CM | POA: Insufficient documentation

## 2019-09-01 DIAGNOSIS — Z01812 Encounter for preprocedural laboratory examination: Secondary | ICD-10-CM | POA: Insufficient documentation

## 2019-09-01 LAB — SARS CORONAVIRUS 2 (TAT 6-24 HRS): SARS Coronavirus 2: NEGATIVE

## 2019-09-04 MED ORDER — TRANEXAMIC ACID 1000 MG/10ML IV SOLN
2000.0000 mg | INTRAVENOUS | Status: DC
  Filled 2019-09-04: qty 20

## 2019-09-04 MED ORDER — BUPIVACAINE LIPOSOME 1.3 % IJ SUSP
20.0000 mL | INTRAMUSCULAR | Status: DC
  Filled 2019-09-04: qty 20

## 2019-09-05 ENCOUNTER — Ambulatory Visit (HOSPITAL_COMMUNITY): Payer: BC Managed Care – PPO | Admitting: Anesthesiology

## 2019-09-05 ENCOUNTER — Observation Stay (HOSPITAL_COMMUNITY)
Admission: RE | Admit: 2019-09-05 | Discharge: 2019-09-06 | Disposition: A | Discharge status: Home / Self Care | Payer: BC Managed Care – PPO | Attending: Orthopaedic Surgery | Admitting: Orthopaedic Surgery

## 2019-09-05 ENCOUNTER — Other Ambulatory Visit: Payer: Self-pay

## 2019-09-05 ENCOUNTER — Encounter (HOSPITAL_COMMUNITY)
Admission: RE | Disposition: A | Discharge status: Home / Self Care | Payer: Self-pay | Source: Home / Self Care | Attending: Orthopaedic Surgery

## 2019-09-05 ENCOUNTER — Encounter (HOSPITAL_COMMUNITY): Payer: Self-pay | Admitting: Orthopaedic Surgery

## 2019-09-05 DIAGNOSIS — M5136 Other intervertebral disc degeneration, lumbar region: Secondary | ICD-10-CM | POA: Diagnosis not present

## 2019-09-05 DIAGNOSIS — M1711 Unilateral primary osteoarthritis, right knee: Principal | ICD-10-CM | POA: Insufficient documentation

## 2019-09-05 DIAGNOSIS — Z888 Allergy status to other drugs, medicaments and biological substances status: Secondary | ICD-10-CM | POA: Diagnosis not present

## 2019-09-05 DIAGNOSIS — K219 Gastro-esophageal reflux disease without esophagitis: Secondary | ICD-10-CM | POA: Diagnosis not present

## 2019-09-05 DIAGNOSIS — Z79899 Other long term (current) drug therapy: Secondary | ICD-10-CM | POA: Diagnosis not present

## 2019-09-05 DIAGNOSIS — Z8249 Family history of ischemic heart disease and other diseases of the circulatory system: Secondary | ICD-10-CM | POA: Diagnosis not present

## 2019-09-05 DIAGNOSIS — Z885 Allergy status to narcotic agent status: Secondary | ICD-10-CM | POA: Insufficient documentation

## 2019-09-05 DIAGNOSIS — I10 Essential (primary) hypertension: Secondary | ICD-10-CM | POA: Insufficient documentation

## 2019-09-05 DIAGNOSIS — Z7982 Long term (current) use of aspirin: Secondary | ICD-10-CM | POA: Diagnosis not present

## 2019-09-05 DIAGNOSIS — G8918 Other acute postprocedural pain: Secondary | ICD-10-CM | POA: Diagnosis not present

## 2019-09-05 DIAGNOSIS — E785 Hyperlipidemia, unspecified: Secondary | ICD-10-CM | POA: Diagnosis not present

## 2019-09-05 HISTORY — PX: TOTAL KNEE ARTHROPLASTY: SHX125

## 2019-09-05 LAB — BPAM RBC
Blood Product Expiration Date: 202104192359
Blood Product Expiration Date: 202104202359
Unit Type and Rh: 5100
Unit Type and Rh: 5100

## 2019-09-05 LAB — TYPE AND SCREEN
ABO/RH(D): O POS
Antibody Screen: POSITIVE
PT AG Type: NEGATIVE
Unit division: 0
Unit division: 0

## 2019-09-05 SURGERY — ARTHROPLASTY, KNEE, TOTAL
Anesthesia: Spinal | Site: Knee | Laterality: Right

## 2019-09-05 MED ORDER — MIDAZOLAM HCL 2 MG/2ML IJ SOLN
1.0000 mg | INTRAMUSCULAR | Status: DC
  Filled 2019-09-05: qty 2

## 2019-09-05 MED ORDER — BUPIVACAINE LIPOSOME 1.3 % IJ SUSP
INTRAMUSCULAR | Status: DC | PRN
  Administered 2019-09-05: 20 mL

## 2019-09-05 MED ORDER — STERILE WATER FOR IRRIGATION IR SOLN
Status: DC | PRN
  Administered 2019-09-05: 2000 mL

## 2019-09-05 MED ORDER — OXYCODONE HCL 5 MG PO TABS
10.0000 mg | ORAL_TABLET | ORAL | Status: DC | PRN
  Administered 2019-09-06: 10 mg via ORAL
  Filled 2019-09-05: qty 2

## 2019-09-05 MED ORDER — CEFAZOLIN SODIUM-DEXTROSE 2-4 GM/100ML-% IV SOLN
2.0000 g | INTRAVENOUS | Status: AC
  Administered 2019-09-05: 2 g via INTRAVENOUS
  Filled 2019-09-05: qty 100

## 2019-09-05 MED ORDER — METOCLOPRAMIDE HCL 5 MG PO TABS: 5.0000 mg | ORAL_TABLET | Freq: Three times a day (TID) | ORAL | Status: DC | PRN

## 2019-09-05 MED ORDER — FENTANYL CITRATE (PF) 100 MCG/2ML IJ SOLN
50.0000 ug | INTRAMUSCULAR | Status: AC
  Administered 2019-09-05: 25 ug via INTRAVENOUS
  Filled 2019-09-05: qty 2

## 2019-09-05 MED ORDER — POVIDONE-IODINE 10 % EX SWAB
2.0000 "application " | Freq: Once | CUTANEOUS | Status: AC
  Administered 2019-09-05: 2 via TOPICAL

## 2019-09-05 MED ORDER — METOCLOPRAMIDE HCL 5 MG/ML IJ SOLN: 5.0000 mg | Freq: Three times a day (TID) | INTRAMUSCULAR | Status: DC | PRN

## 2019-09-05 MED ORDER — CEFAZOLIN SODIUM-DEXTROSE 2-4 GM/100ML-% IV SOLN
2.0000 g | Freq: Four times a day (QID) | INTRAVENOUS | Status: AC
  Administered 2019-09-05 (×2): 2 g via INTRAVENOUS
  Filled 2019-09-05 (×2): qty 100

## 2019-09-05 MED ORDER — DEXAMETHASONE SODIUM PHOSPHATE 10 MG/ML IJ SOLN
INTRAMUSCULAR | Status: AC
  Filled 2019-09-05: qty 1

## 2019-09-05 MED ORDER — BISACODYL 5 MG PO TBEC: 5.0000 mg | DELAYED_RELEASE_TABLET | Freq: Every day | ORAL | Status: DC | PRN

## 2019-09-05 MED ORDER — BUPIVACAINE IN DEXTROSE 0.75-8.25 % IT SOLN
INTRATHECAL | Status: DC | PRN
  Administered 2019-09-05: 1.7 mL via INTRATHECAL

## 2019-09-05 MED ORDER — PHENYLEPHRINE 40 MCG/ML (10ML) SYRINGE FOR IV PUSH (FOR BLOOD PRESSURE SUPPORT)
PREFILLED_SYRINGE | INTRAVENOUS | Status: AC
  Filled 2019-09-05: qty 10

## 2019-09-05 MED ORDER — ASPIRIN 81 MG PO CHEW
81.0000 mg | CHEWABLE_TABLET | Freq: Two times a day (BID) | ORAL | Status: DC
  Administered 2019-09-06: 81 mg via ORAL
  Filled 2019-09-05: qty 1

## 2019-09-05 MED ORDER — LIDOCAINE 2% (20 MG/ML) 5 ML SYRINGE
INTRAMUSCULAR | Status: DC | PRN
  Administered 2019-09-05: 40 mg via INTRAVENOUS

## 2019-09-05 MED ORDER — PHENYLEPHRINE 40 MCG/ML (10ML) SYRINGE FOR IV PUSH (FOR BLOOD PRESSURE SUPPORT)
PREFILLED_SYRINGE | INTRAVENOUS | Status: DC | PRN
  Administered 2019-09-05: 40 ug via INTRAVENOUS
  Administered 2019-09-05: 120 ug via INTRAVENOUS
  Administered 2019-09-05: 100 ug via INTRAVENOUS
  Administered 2019-09-05: 60 ug via INTRAVENOUS
  Administered 2019-09-05: 100 ug via INTRAVENOUS

## 2019-09-05 MED ORDER — DOCUSATE SODIUM 100 MG PO CAPS
100.0000 mg | ORAL_CAPSULE | Freq: Two times a day (BID) | ORAL | Status: DC
  Administered 2019-09-05 – 2019-09-06 (×2): 100 mg via ORAL
  Filled 2019-09-05 (×2): qty 1

## 2019-09-05 MED ORDER — LACTATED RINGERS IV SOLN: INTRAVENOUS | Status: DC

## 2019-09-05 MED ORDER — PROPOFOL 10 MG/ML IV BOLUS
INTRAVENOUS | Status: AC
  Filled 2019-09-05: qty 20

## 2019-09-05 MED ORDER — SODIUM CHLORIDE (PF) 0.9 % IJ SOLN
INTRAMUSCULAR | Status: AC
  Filled 2019-09-05: qty 20

## 2019-09-05 MED ORDER — CHLORHEXIDINE GLUCONATE 4 % EX LIQD: 60.0000 mL | Freq: Once | CUTANEOUS | Status: DC

## 2019-09-05 MED ORDER — ALUM & MAG HYDROXIDE-SIMETH 200-200-20 MG/5ML PO SUSP: 30.0000 mL | ORAL | Status: DC | PRN

## 2019-09-05 MED ORDER — 0.9 % SODIUM CHLORIDE (POUR BTL) OPTIME
TOPICAL | Status: DC | PRN
  Administered 2019-09-05: 2000 mL

## 2019-09-05 MED ORDER — ONDANSETRON HCL 4 MG/2ML IJ SOLN
4.0000 mg | Freq: Four times a day (QID) | INTRAMUSCULAR | Status: DC | PRN
  Administered 2019-09-05: 4 mg via INTRAVENOUS
  Filled 2019-09-05: qty 2

## 2019-09-05 MED ORDER — KETOROLAC TROMETHAMINE 15 MG/ML IJ SOLN
7.5000 mg | Freq: Four times a day (QID) | INTRAMUSCULAR | Status: AC
  Administered 2019-09-05 – 2019-09-06 (×3): 7.5 mg via INTRAVENOUS
  Filled 2019-09-05 (×3): qty 1

## 2019-09-05 MED ORDER — BUPIVACAINE-EPINEPHRINE (PF) 0.5% -1:200000 IJ SOLN
INTRAMUSCULAR | Status: AC
  Filled 2019-09-05: qty 30

## 2019-09-05 MED ORDER — TRANEXAMIC ACID-NACL 1000-0.7 MG/100ML-% IV SOLN
1000.0000 mg | INTRAVENOUS | Status: AC
  Administered 2019-09-05: 1000 mg via INTRAVENOUS
  Filled 2019-09-05: qty 100

## 2019-09-05 MED ORDER — ESTRADIOL 1 MG PO TABS
0.5000 mg | ORAL_TABLET | ORAL | Status: DC
  Administered 2019-09-06: 0.5 mg via ORAL
  Filled 2019-09-05: qty 0.5

## 2019-09-05 MED ORDER — ACETAMINOPHEN 325 MG PO TABS: 325.0000 mg | ORAL_TABLET | Freq: Four times a day (QID) | ORAL | Status: DC | PRN

## 2019-09-05 MED ORDER — PHENYLEPHRINE HCL (PRESSORS) 10 MG/ML IV SOLN
INTRAVENOUS | Status: AC
  Filled 2019-09-05: qty 1

## 2019-09-05 MED ORDER — LATANOPROST 0.005 % OP SOLN
1.0000 [drp] | Freq: Every day | OPHTHALMIC | Status: DC
  Filled 2019-09-05: qty 2.5

## 2019-09-05 MED ORDER — BUPIVACAINE-EPINEPHRINE 0.5% -1:200000 IJ SOLN
INTRAMUSCULAR | Status: DC | PRN
  Administered 2019-09-05: 30 mL

## 2019-09-05 MED ORDER — LIDOCAINE 2% (20 MG/ML) 5 ML SYRINGE
INTRAMUSCULAR | Status: AC
  Filled 2019-09-05: qty 5

## 2019-09-05 MED ORDER — PROPOFOL 10 MG/ML IV BOLUS
INTRAVENOUS | Status: DC | PRN
  Administered 2019-09-05: 20 mg via INTRAVENOUS

## 2019-09-05 MED ORDER — SODIUM CHLORIDE (PF) 0.9 % IJ SOLN
INTRAMUSCULAR | Status: DC | PRN
  Administered 2019-09-05: 30 mL

## 2019-09-05 MED ORDER — SODIUM CHLORIDE 0.9 % IR SOLN
Status: DC | PRN
  Administered 2019-09-05: 1000 mL

## 2019-09-05 MED ORDER — SODIUM CHLORIDE (PF) 0.9 % IJ SOLN
INTRAMUSCULAR | Status: AC
  Filled 2019-09-05: qty 10

## 2019-09-05 MED ORDER — ROPIVACAINE HCL 7.5 MG/ML IJ SOLN
INTRAMUSCULAR | Status: DC | PRN
  Administered 2019-09-05: 20 mL via PERINEURAL

## 2019-09-05 MED ORDER — MENTHOL 3 MG MT LOZG: 1.0000 | LOZENGE | OROMUCOSAL | Status: DC | PRN

## 2019-09-05 MED ORDER — SPIRONOLACTONE 25 MG PO TABS
25.0000 mg | ORAL_TABLET | Freq: Two times a day (BID) | ORAL | Status: DC
  Administered 2019-09-05: 25 mg via ORAL
  Filled 2019-09-05 (×3): qty 1

## 2019-09-05 MED ORDER — METHOCARBAMOL 500 MG PO TABS
500.0000 mg | ORAL_TABLET | Freq: Four times a day (QID) | ORAL | Status: DC | PRN
  Filled 2019-09-05: qty 1

## 2019-09-05 MED ORDER — LORATADINE 10 MG PO TABS
10.0000 mg | ORAL_TABLET | Freq: Every day | ORAL | Status: DC
  Administered 2019-09-05 – 2019-09-06 (×2): 10 mg via ORAL
  Filled 2019-09-05 (×2): qty 1

## 2019-09-05 MED ORDER — HYDROMORPHONE HCL 1 MG/ML IJ SOLN
0.5000 mg | INTRAMUSCULAR | Status: DC | PRN
  Administered 2019-09-05: 0.5 mg via INTRAVENOUS
  Filled 2019-09-05: qty 1

## 2019-09-05 MED ORDER — OXYCODONE HCL 5 MG PO TABS
5.0000 mg | ORAL_TABLET | ORAL | Status: DC | PRN
  Administered 2019-09-05: 5 mg via ORAL
  Filled 2019-09-05: qty 1
  Filled 2019-09-05: qty 2

## 2019-09-05 MED ORDER — PHENOL 1.4 % MT LIQD: 1.0000 | OROMUCOSAL | Status: DC | PRN

## 2019-09-05 MED ORDER — ONDANSETRON HCL 4 MG/2ML IJ SOLN
INTRAMUSCULAR | Status: DC | PRN
  Administered 2019-09-05: 4 mg via INTRAVENOUS

## 2019-09-05 MED ORDER — DIPHENHYDRAMINE HCL 12.5 MG/5ML PO ELIX: 12.5000 mg | ORAL_SOLUTION | ORAL | Status: DC | PRN

## 2019-09-05 MED ORDER — PHENYLEPHRINE HCL-NACL 10-0.9 MG/250ML-% IV SOLN
INTRAVENOUS | Status: DC | PRN
  Administered 2019-09-05: 25 ug/min via INTRAVENOUS

## 2019-09-05 MED ORDER — CYCLOBENZAPRINE HCL 5 MG PO TABS: 5.0000 mg | ORAL_TABLET | Freq: Three times a day (TID) | ORAL | Status: DC | PRN

## 2019-09-05 MED ORDER — PROPOFOL 1000 MG/100ML IV EMUL
INTRAVENOUS | Status: AC
  Filled 2019-09-05: qty 100

## 2019-09-05 MED ORDER — DEXAMETHASONE SODIUM PHOSPHATE 10 MG/ML IJ SOLN
INTRAMUSCULAR | Status: DC | PRN
  Administered 2019-09-05: 5 mg via INTRAVENOUS

## 2019-09-05 MED ORDER — FLUTICASONE PROPIONATE 50 MCG/ACT NA SUSP
2.0000 | Freq: Every day | NASAL | Status: DC
  Administered 2019-09-05 – 2019-09-06 (×2): 2 via NASAL
  Filled 2019-09-05: qty 16

## 2019-09-05 MED ORDER — TRANEXAMIC ACID-NACL 1000-0.7 MG/100ML-% IV SOLN
1000.0000 mg | Freq: Once | INTRAVENOUS | Status: AC
  Administered 2019-09-05: 1000 mg via INTRAVENOUS
  Filled 2019-09-05: qty 100

## 2019-09-05 MED ORDER — GABAPENTIN 100 MG PO CAPS
100.0000 mg | ORAL_CAPSULE | Freq: Every day | ORAL | Status: DC
  Administered 2019-09-05: 100 mg via ORAL
  Filled 2019-09-05: qty 1

## 2019-09-05 MED ORDER — PROPOFOL 500 MG/50ML IV EMUL
INTRAVENOUS | Status: DC | PRN
  Administered 2019-09-05: 75 ug/kg/min via INTRAVENOUS

## 2019-09-05 MED ORDER — PANTOPRAZOLE SODIUM 40 MG PO TBEC
80.0000 mg | DELAYED_RELEASE_TABLET | Freq: Every day | ORAL | Status: DC
  Administered 2019-09-05 – 2019-09-06 (×2): 80 mg via ORAL
  Filled 2019-09-05 (×2): qty 2

## 2019-09-05 MED ORDER — ONDANSETRON HCL 4 MG PO TABS: 4.0000 mg | ORAL_TABLET | Freq: Four times a day (QID) | ORAL | Status: DC | PRN

## 2019-09-05 MED ORDER — TRANEXAMIC ACID 1000 MG/10ML IV SOLN
INTRAVENOUS | Status: DC | PRN
  Administered 2019-09-05: 2000 mg via TOPICAL

## 2019-09-05 MED ORDER — METHOCARBAMOL 500 MG IVPB - SIMPLE MED
500.0000 mg | Freq: Four times a day (QID) | INTRAVENOUS | Status: DC | PRN
  Administered 2019-09-05: 500 mg via INTRAVENOUS
  Filled 2019-09-05: qty 500
  Filled 2019-09-05: qty 50

## 2019-09-05 MED ORDER — ACETAMINOPHEN 500 MG PO TABS
1000.0000 mg | ORAL_TABLET | Freq: Four times a day (QID) | ORAL | Status: AC
  Administered 2019-09-05 – 2019-09-06 (×4): 1000 mg via ORAL
  Filled 2019-09-05 (×4): qty 2

## 2019-09-05 MED ORDER — POTASSIUM CHLORIDE CRYS ER 20 MEQ PO TBCR
20.0000 meq | EXTENDED_RELEASE_TABLET | Freq: Every day | ORAL | Status: DC
  Administered 2019-09-05 – 2019-09-06 (×2): 20 meq via ORAL
  Filled 2019-09-05 (×2): qty 1

## 2019-09-05 MED ORDER — ONDANSETRON HCL 4 MG/2ML IJ SOLN
INTRAMUSCULAR | Status: AC
  Filled 2019-09-05: qty 2

## 2019-09-05 SURGICAL SUPPLY — 54 items
ATTUNE PSFEM RTSZ6 NARCEM KNEE (Femur) ×2 IMPLANT
ATTUNE PSRP INSR SZ6 5 KNEE (Insert) ×1 IMPLANT
ATTUNE PSRP INSR SZ6 5MM KNEE (Insert) ×1 IMPLANT
BAG DECANTER FOR FLEXI CONT (MISCELLANEOUS) ×3 IMPLANT
BAG SPEC THK2 15X12 ZIP CLS (MISCELLANEOUS) ×1
BAG ZIPLOCK 12X15 (MISCELLANEOUS) ×3 IMPLANT
BASE TIBIA ATTUNE KNEE SYS SZ6 (Knees) IMPLANT
BLADE SAGITTAL 25.0X1.19X90 (BLADE) ×2 IMPLANT
BLADE SAGITTAL 25.0X1.19X90MM (BLADE) ×1
BLADE SAW SGTL 11.0X1.19X90.0M (BLADE) ×3 IMPLANT
BNDG ELASTIC 6X5.8 VLCR STR LF (GAUZE/BANDAGES/DRESSINGS) ×3 IMPLANT
BOOTIES KNEE HIGH SLOAN (MISCELLANEOUS) ×3 IMPLANT
BOWL SMART MIX CTS (DISPOSABLE) ×3 IMPLANT
BSPLAT TIB 6 CMNT ROT PLAT STR (Knees) ×1 IMPLANT
CEMENT HV SMART SET (Cement) ×6 IMPLANT
COVER SURGICAL LIGHT HANDLE (MISCELLANEOUS) ×3 IMPLANT
COVER WAND RF STERILE (DRAPES) ×3 IMPLANT
CUFF TOURN SGL QUICK 34 (TOURNIQUET CUFF) ×3
CUFF TRNQT CYL 34X4.125X (TOURNIQUET CUFF) ×1 IMPLANT
DECANTER SPIKE VIAL GLASS SM (MISCELLANEOUS) ×6 IMPLANT
DRAPE SHEET LG 3/4 BI-LAMINATE (DRAPES) ×3 IMPLANT
DRAPE TOP 10253 STERILE (DRAPES) ×3 IMPLANT
DRAPE U-SHAPE 47X51 STRL (DRAPES) ×3 IMPLANT
DRSG AQUACEL AG ADV 3.5X10 (GAUZE/BANDAGES/DRESSINGS) ×3 IMPLANT
DURAPREP 26ML APPLICATOR (WOUND CARE) ×6 IMPLANT
ELECT REM PT RETURN 15FT ADLT (MISCELLANEOUS) ×3 IMPLANT
GLOVE BIO SURGEON STRL SZ8 (GLOVE) ×6 IMPLANT
GLOVE BIOGEL PI IND STRL 8 (GLOVE) ×2 IMPLANT
GLOVE BIOGEL PI INDICATOR 8 (GLOVE) ×4
GOWN STRL REUS W/TWL XL LVL3 (GOWN DISPOSABLE) ×6 IMPLANT
HANDPIECE INTERPULSE COAX TIP (DISPOSABLE) ×3
HOLDER FOLEY CATH W/STRAP (MISCELLANEOUS) ×2 IMPLANT
HOOD PEEL AWAY FLYTE STAYCOOL (MISCELLANEOUS) ×9 IMPLANT
KIT TURNOVER KIT A (KITS) IMPLANT
MANIFOLD NEPTUNE II (INSTRUMENTS) ×3 IMPLANT
NS IRRIG 1000ML POUR BTL (IV SOLUTION) ×3 IMPLANT
PACK TOTAL KNEE CUSTOM (KITS) ×3 IMPLANT
PAD ARMBOARD 7.5X6 YLW CONV (MISCELLANEOUS) ×3 IMPLANT
PATELLA MEDIAL ATTUN 35MM KNEE (Knees) ×2 IMPLANT
PENCIL SMOKE EVACUATOR (MISCELLANEOUS) ×2 IMPLANT
PIN DRILL FIX HALF THREAD (BIT) ×2 IMPLANT
PIN STEINMAN FIXATION KNEE (PIN) ×2 IMPLANT
PROTECTOR NERVE ULNAR (MISCELLANEOUS) ×3 IMPLANT
SET HNDPC FAN SPRY TIP SCT (DISPOSABLE) ×1 IMPLANT
SUT ETHIBOND NAB CT1 #1 30IN (SUTURE) ×6 IMPLANT
SUT VIC AB 0 CT1 36 (SUTURE) ×3 IMPLANT
SUT VIC AB 2-0 CT1 27 (SUTURE) ×3
SUT VIC AB 2-0 CT1 TAPERPNT 27 (SUTURE) ×1 IMPLANT
SUT VICRYL AB 3-0 FS1 BRD 27IN (SUTURE) ×3 IMPLANT
TIBIA ATTUNE KNEE SYS BASE SZ6 (Knees) ×3 IMPLANT
TRAY FOLEY MTR SLVR 14FR STAT (SET/KITS/TRAYS/PACK) ×2 IMPLANT
WATER STERILE IRR 1000ML POUR (IV SOLUTION) ×5 IMPLANT
WRAP KNEE MAXI GEL POST OP (GAUZE/BANDAGES/DRESSINGS) ×3 IMPLANT
YANKAUER SUCT BULB TIP 10FT TU (MISCELLANEOUS) ×2 IMPLANT

## 2019-09-05 NOTE — Anesthesia Procedure Notes (Signed)
Anesthesia Regional Block: Adductor canal block   Pre-Anesthetic Checklist: ,, timeout performed, Correct Patient, Correct Site, Correct Laterality, Correct Procedure, Correct Position, site marked, Risks and benefits discussed,  Surgical consent,  Pre-op evaluation,  At surgeon's request and post-op pain management  Laterality: Right  Prep: chloraprep       Needles:  Injection technique: Single-shot  Needle Type: Echogenic Stimulator Needle     Needle Length: 9cm  Needle Gauge: 21     Additional Needles:   Procedures:,,,, ultrasound used (permanent image in chart),,,,  Narrative:  Start time: 09/05/2019 9:25 AM End time: 09/05/2019 9:35 AM Injection made incrementally with aspirations every 5 mL.  Performed by: Personally  Anesthesiologist: Shelton Silvas, MD  Additional Notes: Patient tolerated the procedure well. Local anesthetic introduced in an incremental fashion under minimal resistance after negative aspirations. No paresthesias were elicited. After completion of the procedure, no acute issues were identified and patient continued to be monitored by RN.

## 2019-09-05 NOTE — Anesthesia Postprocedure Evaluation (Signed)
Anesthesia Post Note  Patient: Lynzy Rawles  Procedure(s) Performed: RIGHT TOTAL KNEE ARTHROPLASTY (Right Knee)     Patient location during evaluation: PACU Anesthesia Type: Spinal Level of consciousness: oriented and awake and alert Pain management: pain level controlled Vital Signs Assessment: post-procedure vital signs reviewed and stable Respiratory status: spontaneous breathing, respiratory function stable and patient connected to nasal cannula oxygen Cardiovascular status: blood pressure returned to baseline and stable Postop Assessment: no headache, no backache and no apparent nausea or vomiting Anesthetic complications: no    Last Vitals:  Vitals:   09/05/19 1400 09/05/19 1415  BP: 114/62   Pulse: (!) 47   Resp: 10   Temp:  (!) 36.4 C  SpO2: 99%     Last Pain:  Vitals:   09/05/19 1345  TempSrc:   PainSc: 0-No pain                 Shelton Silvas

## 2019-09-05 NOTE — Progress Notes (Signed)
Patient has a 1 3/3 length skin tear on right buttock. She states they are related to allergies and she scratches causing skin to break. It's width is 0.2cm and no depth.

## 2019-09-05 NOTE — Anesthesia Procedure Notes (Signed)
Spinal  Patient location during procedure: OR Start time: 09/05/2019 9:57 AM End time: 09/05/2019 10:01 AM Staffing Performed: resident/CRNA  Anesthesiologist: Shelton Silvas, MD Resident/CRNA: Yolonda Kida, CRNA Preanesthetic Checklist Completed: patient identified, IV checked, site marked, risks and benefits discussed, surgical consent, monitors and equipment checked, pre-op evaluation and timeout performed Spinal Block Patient position: sitting Prep: DuraPrep Patient monitoring: blood pressure, continuous pulse ox and heart rate Approach: midline Location: L3-4 Injection technique: single-shot Needle Needle type: Pencan  Needle gauge: 24 G Assessment Sensory level: T6

## 2019-09-05 NOTE — Plan of Care (Signed)

## 2019-09-05 NOTE — Interval H&P Note (Signed)
History and Physical Interval Note:  09/05/2019 9:06 AM  Catherine Hoffman  has presented today for surgery, with the diagnosis of RIGHT KNEE DEGENERATIVE JOINT DISEASE.  The various methods of treatment have been discussed with the patient and family. After consideration of risks, benefits and other options for treatment, the patient has consented to  Procedure(s): RIGHT TOTAL KNEE ARTHROPLASTY (Right) as a surgical intervention.  The patient's history has been reviewed, patient examined, no change in status, stable for surgery.  I have reviewed the patient's chart and labs.  Questions were answered to the patient's satisfaction.     [name]

## 2019-09-05 NOTE — Anesthesia Preprocedure Evaluation (Addendum)
Anesthesia Evaluation  Patient identified by MRN, date of birth, ID band Patient awake    Reviewed: Allergy & Precautions, NPO status , Patient's Chart, lab work & pertinent test results  Airway Mallampati: III  TM Distance: >3 FB Neck ROM: Full    Dental  (+) Upper Dentures, Dental Advisory Given   Pulmonary asthma ,    breath sounds clear to auscultation       Cardiovascular hypertension,  Rhythm:Regular Rate:Normal     Neuro/Psych  Headaches, negative psych ROS   GI/Hepatic Neg liver ROS, GERD  Medicated,  Endo/Other  negative endocrine ROS  Renal/GU Renal InsufficiencyRenal disease     Musculoskeletal  (+) Arthritis ,   Abdominal (+) + obese,   Peds  Hematology negative hematology ROS (+)   Anesthesia Other Findings   Reproductive/Obstetrics                            Lab Results  Component Value Date   WBC 5.9 08/29/2019   HGB 12.5 08/29/2019   HCT 39.7 08/29/2019   MCV 98.5 08/29/2019   PLT 206 08/29/2019   EKG: sinus bradycardia.   Anesthesia Physical Anesthesia Plan  ASA: II  Anesthesia Plan: Spinal   Post-op Pain Management:  Regional for Post-op pain   Induction: Intravenous  PONV Risk Score and Plan: 3 and Ondansetron, Dexamethasone and Treatment may vary due to age or medical condition  Airway Management Planned: Natural Airway and Simple Face Mask  Additional Equipment: None  Intra-op Plan:   Post-operative Plan:   Informed Consent: I have reviewed the patients History and Physical, chart, labs and discussed the procedure including the risks, benefits and alternatives for the proposed anesthesia with the patient or authorized representative who has indicated his/her understanding and acceptance.     Dental advisory given  Plan Discussed with: CRNA  Anesthesia Plan Comments:        Anesthesia Quick Evaluation

## 2019-09-05 NOTE — Evaluation (Signed)
Physical Therapy Evaluation Patient Details Name: Catherine Hoffman MRN: [id number] DOB: 1940/10/20 Today's Date: 09/05/2019   History of Present Illness  Patient is 79 y.o. female s/p Rt TKA on 09/05/19 with PMH significant for HTN, HLD, glaucoma, GERD, OA, Lt RCR.  Clinical Impression  Catherine Hoffman is a 79 y.o. female POD 0 s/p Rt TKA. Patient reports modified independence with use of RW and SPC for mobility. Patient is now limited by functional impairments (see PT problem list below) and requires min-mod assist for bed mobility and transfers with RW. Patient was limited by quad weakness, pain, and nausea with standing and gait was deferred. Patient instructed in exercise to facilitate circulation. Patient will benefit from continued skilled PT interventions to address impairments and progress towards PLOF. Acute PT will follow to progress mobility and stair training in preparation for safe discharge home.     Follow Up Recommendations Follow surgeon's recommendation for DC plan and follow-up therapies;SNF(pt plans for home w/HHPT, if pt does not progress rec SNF)    Equipment Recommendations  Other (comment)(pt has RW at home)    Recommendations for Other Services       Precautions / Restrictions Precautions Precautions: Fall Restrictions Weight Bearing Restrictions: No Other Position/Activity Restrictions: WBAT      Mobility  Bed Mobility Overal bed mobility: Needs Assistance Bed Mobility: Supine to Sit;Sit to Supine     Supine to sit: Mod assist;HOB elevated Sit to supine: Mod assist;HOB elevated   General bed mobility comments: cues for use of bed rail, assist to bring Rt LE to EOB and assist to raise trunk upright. mod assist to lower trunk and raise LE's into bed to return to supine.  Transfers Overall transfer level: Needs assistance Equipment used: Rolling walker (2 wheeled) Transfers: Sit to/from Stand Sit to Stand: Min assist;From elevated surface          General transfer comment: cues for safe hand placement and technique for power up with RW. min assist for powerup and to rise. assist to block Rt knee in standing, pt with decreased weight bearing on Rt LE and no buckling noted. pt c/o pain and nausea in standing.   Ambulation/Gait         Stairs     Wheelchair Mobility    Modified Rankin (Stroke Patients Only)       Balance Overall balance assessment: Needs assistance Sitting-balance support: Feet supported Sitting balance-Leahy Scale: Good     Standing balance support: During functional activity;Bilateral upper extremity supported Standing balance-Leahy Scale: Poor          Pertinent Vitals/Pain Pain Assessment: 0-10 Pain Score: 5  Pain Location: Rt knee Pain Descriptors / Indicators: Aching;Sore Pain Intervention(s): Limited activity within patient's tolerance;Monitored during session;Repositioned;Ice applied    Home Living Family/patient expects to be discharged to:: Private residence Living Arrangements: Alone Available Help at Discharge: Friend(s);Family;Available 24 hours/day Type of Home: House Home Access: Level entry     Home Layout: One level Home Equipment: Walker - 2 wheels;Cane - single point Additional Comments: pt's sister is going to stay for 2 weeks and 2 clsoe friends will help as well.    Prior Function Level of Independence: Independent with assistive device(s)         Comments: pt using RW and SPC to mobilzie     Hand Dominance   Dominant Hand: Right    Extremity/Trunk Assessment   Upper Extremity Assessment Upper Extremity Assessment: Generalized weakness    Lower Extremity Assessment  Lower Extremity Assessment: Generalized weakness;RLE deficits/detail RLE Deficits / Details: pt with poor quad activation, unable to complete SAQ, LAQ, or SLR. pt with 1/5 for MMT. RLE: Unable to fully assess due to pain RLE Sensation: WNL RLE Coordination: decreased gross motor     Cervical / Trunk Assessment Cervical / Trunk Assessment: Normal  Communication   Communication: No difficulties  Cognition Arousal/Alertness: Awake/alert Behavior During Therapy: WFL for tasks assessed/performed Overall Cognitive Status: Within Functional Limits for tasks assessed       General Comments      Exercises Total Joint Exercises Ankle Circles/Pumps: AROM;Both;10 reps;Supine   Assessment/Plan    PT Assessment Patient needs continued PT services  PT Problem List Decreased strength;Decreased range of motion;Decreased activity tolerance;Decreased balance;Decreased mobility;Decreased knowledge of use of DME;Decreased knowledge of precautions;Pain       PT Treatment Interventions DME instruction;Gait training;Stair training;Functional mobility training;Therapeutic activities;Therapeutic exercise;Balance training;Patient/family education    PT Goals (Current goals can be found in the Care Plan section)  Acute Rehab PT Goals Patient Stated Goal: get Lt knee replaced after recovered from Rt PT Goal Formulation: With patient Time For Goal Achievement: 09/12/19 Potential to Achieve Goals: Good    Frequency 7X/week    AM-PAC PT "6 Clicks" Mobility  Outcome Measure Help needed turning from your back to your side while in a flat bed without using bedrails?: A Little Help needed moving from lying on your back to sitting on the side of a flat bed without using bedrails?: A Lot Help needed moving to and from a bed to a chair (including a wheelchair)?: A Lot Help needed standing up from a chair using your arms (e.g., wheelchair or bedside chair)?: A Lot Help needed to walk in hospital room?: A Lot Help needed climbing 3-5 steps with a railing? : A Lot 6 Click Score: 13    End of Session Equipment Utilized During Treatment: Gait belt Activity Tolerance: Patient tolerated treatment well Patient left: in bed;with call bell/phone within reach;with bed alarm set;with  family/visitor present Nurse Communication: Mobility status(pt needs nausea meds, RN brought at EOS) PT Visit Diagnosis: Muscle weakness (generalized) (M62.81);Difficulty in walking, not elsewhere classified (R26.2)    Time: 5102-5852 PT Time Calculation (min) (ACUTE ONLY): 32 min   Charges:   PT Evaluation $PT Eval Low Complexity: 1 Low PT Treatments $Therapeutic Activity: 8-22 mins        Wynn Maudlin, DPT Physical Therapist with [facility] [phone]  09/05/2019 7:10 PM

## 2019-09-05 NOTE — Transfer of Care (Signed)
Immediate Anesthesia Transfer of Care Note  Patient: Catherine Hoffman  Procedure(s) Performed: RIGHT TOTAL KNEE ARTHROPLASTY (Right Knee)  Patient Location: PACU  Anesthesia Type:Spinal  Level of Consciousness: drowsy and patient cooperative  Airway & Oxygen Therapy: Patient Spontanous Breathing and Patient connected to face mask oxygen  Post-op Assessment: Report given to RN and Post -op Vital signs reviewed and stable  Post vital signs: Reviewed and stable  Last Vitals:  Vitals Value Taken Time  BP 96/58 09/05/19 1209  Temp    Pulse 53 09/05/19 1211  Resp 11 09/05/19 1211  SpO2 100 % 09/05/19 1211  Vitals shown include unvalidated device data.  Last Pain:  Vitals:   09/05/19 0936  TempSrc:   PainSc: 0-No pain         Complications: No apparent anesthesia complications

## 2019-09-05 NOTE — Op Note (Signed)
PREOP DIAGNOSIS: DJD RIGHT KNEE POSTOP DIAGNOSIS: same PROCEDURE: RIGHT TKR ANESTHESIA: Spinal and MAC ATTENDING SURGEON: Hessie Dibble ASSISTANT: Loni Dolly PA  INDICATIONS FOR PROCEDURE: Catherine Hoffman is a 79 y.o. female who has struggled for a long time with pain due to degenerative arthritis of the right knee.  The patient has failed many conservative non-operative measures and at this point has pain which limits the ability to sleep and walk.  The patient is offered total knee replacement.  Informed operative consent was obtained after discussion of possible risks of anesthesia, infection, neurovascular injury, DVT, and death.  The importance of the post-operative rehabilitation protocol to optimize result was stressed extensively with the patient.  SUMMARY OF FINDINGS AND PROCEDURE:  Catherine Hoffman was taken to the operative suite where under the above anesthesia a right knee replacement was performed.  There were advanced degenerative changes and the bone quality was poor.  We used the DePuy Attune system and placed size 6 narrow femur, 6 tibia, 35 mm all polyethylene patella, and a size 5 mm spacer.  Loni Dolly PA-C assisted throughout and was invaluable to the completion of the case in that he helped retract and maintain exposure while I placed components.  He also helped close thereby minimizing OR time.  The patient was admitted for appropriate post-op care to include perioperative antibiotics and mechanical and pharmacologic measures for DVT prophylaxis.  DESCRIPTION OF PROCEDURE:  Catherine Hoffman was taken to the operative suite where the above anesthesia was applied.  The patient was positioned supine and prepped and draped in normal sterile fashion.  An appropriate time out was performed.  After the administration of kefzol pre-op antibiotic the leg was elevated and exsanguinated and a tourniquet inflated. A standard longitudinal incision was made on the anterior knee.   Dissection was carried down to the extensor mechanism.  All appropriate anti-infective measures were used including the pre-operative antibiotic, betadine impregnated drape, and closed hooded exhaust systems for each member of the surgical team.  A medial parapatellar incision was made in the extensor mechanism and the knee cap flipped and the knee flexed.  Some residual meniscal tissues were removed along with any remaining ACL/PCL tissue.  A guide was placed on the tibia and a flat cut was made on it's superior surface.  An intramedullary guide was placed in the femur and was utilized to make anterior and posterior cuts creating an appropriate flexion gap.  A second intramedullary guide was placed in the femur to make a distal cut properly balancing the knee with an extension gap equal to the flexion gap.  The three bones sized to the above mentioned sizes and the appropriate guides were placed and utilized.  A trial reduction was done and the knee easily came to full extension and the patella tracked well on flexion.  The trial components were removed and all bones were cleaned with pulsatile lavage and then dried thoroughly.  Cement was mixed and was pressurized onto the bones followed by placement of the aforementioned components.  Excess cement was trimmed and pressure was held on the components until the cement had hardened.  The tourniquet was deflated and a small amount of bleeding was controlled with cautery and pressure.  The knee was irrigated thoroughly.  The extensor mechanism was re-approximated with #1 ethibond in interrupted fashion.  The knee was flexed and the repair was solid.  The subcutaneous tissues were re-approximated with #0 and #2-0 vicryl and the skin closed  with a subcuticular stitch and steristrips.  A sterile dressing was applied.  Intraoperative fluids, EBL, and tourniquet time can be obtained from anesthesia records.  DISPOSITION:  The patient was taken to recovery room in stable  condition and admitted for appropriate post-op care to include peri-operative antibiotic and DVT prophylaxis with mechanical and pharmacologic measures.  Hessie Dibble 09/05/2019, 11:40 AM

## 2019-09-05 NOTE — Progress Notes (Signed)
Assisted Dr. Hollis with right, ultrasound guided, adductor canal block. Side rails up, monitors on throughout procedure. See vital signs in flow sheet. Tolerated Procedure well.  

## 2019-09-06 ENCOUNTER — Encounter: Payer: Self-pay | Admitting: *Deleted

## 2019-09-06 DIAGNOSIS — M1711 Unilateral primary osteoarthritis, right knee: Secondary | ICD-10-CM | POA: Diagnosis not present

## 2019-09-06 DIAGNOSIS — E785 Hyperlipidemia, unspecified: Secondary | ICD-10-CM | POA: Diagnosis not present

## 2019-09-06 DIAGNOSIS — I10 Essential (primary) hypertension: Secondary | ICD-10-CM | POA: Diagnosis not present

## 2019-09-06 DIAGNOSIS — M5136 Other intervertebral disc degeneration, lumbar region: Secondary | ICD-10-CM | POA: Diagnosis not present

## 2019-09-06 DIAGNOSIS — Z7982 Long term (current) use of aspirin: Secondary | ICD-10-CM | POA: Diagnosis not present

## 2019-09-06 DIAGNOSIS — Z79899 Other long term (current) drug therapy: Secondary | ICD-10-CM | POA: Diagnosis not present

## 2019-09-06 MED ORDER — ASPIRIN 81 MG PO TABS: 81.0000 mg | ORAL_TABLET | Freq: Every day | ORAL | 0 refills | Status: DC

## 2019-09-06 MED ORDER — OXYCODONE-ACETAMINOPHEN 5-325 MG PO TABS: 1.0000 | ORAL_TABLET | Freq: Four times a day (QID) | ORAL | 0 refills | Status: DC | PRN

## 2019-09-06 MED ORDER — TIZANIDINE HCL 4 MG PO TABS: 4.0000 mg | ORAL_TABLET | Freq: Four times a day (QID) | ORAL | 1 refills | Status: AC | PRN

## 2019-09-06 NOTE — Progress Notes (Signed)
Physical Therapy Treatment Patient Details Name: Catherine Hoffman MRN: [id number] DOB: 09-14-1940 Today's Date: 09/06/2019    History of Present Illness Patient is 79 y.o. female s/p Rt TKA on 09/05/19 with PMH significant for HTN, HLD, glaucoma, GERD, OA, Lt RCR.    PT Comments    POD # 1 pm session with family present for education Assisted out of recliner to amb in hallway, assist to bathroom and review HEP.   Follow Up Recommendations  Follow surgeon's recommendation for DC plan and follow-up therapies;SNF     Equipment Recommendations  3in1 (PT)    Recommendations for Other Services       Precautions / Restrictions Precautions Precautions: Fall Precaution Comments: reviewed no pillow under knee Restrictions Weight Bearing Restrictions: No Other Position/Activity Restrictions: WBAT    Mobility  Bed Mobility Overal bed mobility: Needs Assistance Bed Mobility: Supine to Sit     Supine to sit: Mod assist;HOB elevated     General bed mobility comments: OOB in recliner  Transfers Overall transfer level: Needs assistance Equipment used: Rolling walker (2 wheeled) Transfers: Sit to/from [facility] Sit to Stand: Supervision Stand pivot transfers: Supervision;Min guard       General transfer comment: <25% VC's on proper hand placement and safety with turns.  Instructed family on safe handling.  Ambulation/Gait Ambulation/Gait assistance: Supervision;Min guard Gait Distance (Feet): 43 Feet Assistive device: Rolling walker (2 wheeled) Gait Pattern/deviations: Step-to pattern;Decreased stance time - right Gait velocity: decreased   General Gait Details: tolerated an increased functional distance with < 25% VC's on safety with turns with family education   Stairs             Wheelchair Mobility    Modified Rankin (Stroke Patients Only)       Balance                                            Cognition  Arousal/Alertness: Awake/alert Behavior During Therapy: WFL for tasks assessed/performed Overall Cognitive Status: Within Functional Limits for tasks assessed                                 General Comments: pt more awake, alert this afternoon (meds? vs poor sleep last night?)      Exercises      General Comments        Pertinent Vitals/Pain Pain Assessment: 0-10 Pain Score: 5  Pain Location: Rt knee Pain Descriptors / Indicators: Aching;Sore;Discomfort;Operative site guarding Pain Intervention(s): Monitored during session;Repositioned;Patient requesting pain meds-RN notified    Home Living                      Prior Function            PT Goals (current goals can now be found in the care plan section) Progress towards PT goals: Progressing toward goals    Frequency    7X/week      PT Plan Current plan remains appropriate    Co-evaluation              AM-PAC PT "6 Clicks" Mobility   Outcome Measure  Help needed turning from your back to your side while in a flat bed without using bedrails?: A Little Help needed moving from lying on your back to sitting on  the side of a flat bed without using bedrails?: A Little Help needed moving to and from a bed to a chair (including a wheelchair)?: A Little Help needed standing up from a chair using your arms (e.g., wheelchair or bedside chair)?: A Little Help needed to walk in hospital room?: A Lot Help needed climbing 3-5 steps with a railing? : Total 6 Click Score: 15    End of Session Equipment Utilized During Treatment: Gait belt Activity Tolerance: Patient tolerated treatment well Patient left: in chair;with call bell/phone within reach;with chair alarm set Nurse Communication: Mobility status(pt has met goals to D/C to home today with family support) PT Visit Diagnosis: Muscle weakness (generalized) (M62.81);Difficulty in walking, not elsewhere classified (R26.2)     Time:  1430-1455 PT Time Calculation (min) (ACUTE ONLY): 25 min  Charges:  $Gait Training: 8-22 mins $Self Care/Home Management: [place]  PTA Acute  Rehabilitation Services Pager      [phone] Office      [phone]

## 2019-09-06 NOTE — Plan of Care (Signed)
resolved 

## 2019-09-06 NOTE — TOC Progression Note (Signed)
Transition of Care [facility]) - Progression Note    Patient Details  Name: Catherine Hoffman MRN: [id number] Date of Birth: 12-Feb-1941  [facility] CM/SW Contact  Clearance Coots, LCSW Phone Number: 09/06/2019, 10:13 AM  Clinical Narrative:    3 in1 ordered and delivered by [employer].    Expected Discharge Plan: Home w Home Health Services    Expected Discharge Plan and Services Expected Discharge Plan: Home w Home Health Services         Expected Discharge Date: 09/06/19               DME Arranged: 3-N-1 DME Agency: [employer] Date DME Agency Contacted: 09/06/19 Time DME Agency Contacted: 0900 Representative spoke with at DME Agency: Harrold Donath             Social Determinants of Health (SDOH) Interventions    Readmission Risk Interventions No flowsheet data found.

## 2019-09-06 NOTE — Discharge Summary (Signed)
Patient ID: Catherine Hoffman MRN: [id number] DOB/AGE: 12-Nov-1940 79 y.o.  Admit date: 09/05/2019 Discharge date: 09/06/2019  Admission Diagnoses:  Principal Problem:   Primary osteoarthritis of right knee   Discharge Diagnoses:  Same  Past Medical History:  Diagnosis Date  . Arthritis   . Cataract    removed both eyes   . Episodic tension type headache   . Esophageal reflux   . GERD (gastroesophageal reflux disease)   . Glaucoma   . Heart murmur    as young adult  . Hyperlipidemia   . Hypertension   . Internal hemorrhoids   . Kidney stone on left side Hx-date unknown  . Left rotator cuff tear     Surgeries: Procedure(s): RIGHT TOTAL KNEE ARTHROPLASTY on 09/05/2019   Consultants:   Discharged Condition: Improved  Hospital Course: Catherine Hoffman is an 79 y.o. female who was admitted 09/05/2019 for operative treatment ofPrimary osteoarthritis of right knee. Patient has severe unremitting pain that affects sleep, daily activities, and work/hobbies. After pre-op clearance the patient was taken to the operating room on 09/05/2019 and underwent  Procedure(s): RIGHT TOTAL KNEE ARTHROPLASTY.    Patient was given perioperative antibiotics:  Anti-infectives (From admission, onward)   Start     Dose/Rate Route Frequency Ordered Stop   09/05/19 1600  ceFAZolin (ANCEF) IVPB 2g/100 mL premix     2 g 200 mL/hr over 30 Minutes Intravenous Every 6 hours 09/05/19 1254 09/05/19 2131   09/05/19 0745  ceFAZolin (ANCEF) IVPB 2g/100 mL premix     2 g 200 mL/hr over 30 Minutes Intravenous On call to O.R. 09/05/19 0736 09/05/19 1005       Patient was given sequential compression devices, early ambulation, and chemoprophylaxis to prevent DVT.  Patient benefited maximally from hospital stay and there were no complications.    Recent vital signs:  Patient Vitals for the past 24 hrs:  BP Temp Temp src Pulse Resp SpO2 Height Weight  09/06/19 0618 118/76 -- -- (!) 57 -- -- -- --  09/06/19  0516 (!) 105/56 98.1 F (36.7 C) -- (!) 55 20 100 % -- --  09/06/19 0146 (!) 104/54 97.8 F (36.6 C) -- (!) 55 19 100 % -- --  09/05/19 2204 (!) 108/48 97.7 F (36.5 C) -- (!) 53 17 100 % -- --  09/05/19 1811 (!) 106/48 97.6 F (36.4 C) Oral (!) 50 16 100 % -- --  09/05/19 1639 127/60 98.3 F (36.8 C) Oral (!) 56 16 100 % -- --  09/05/19 1531 123/64 97.7 F (36.5 C) Oral (!) 50 14 100 % -- --  09/05/19 1435 (!) 112/56 97.6 F (36.4 C) -- (!) 45 14 100 % -- --  09/05/19 1430 -- -- -- -- -- -- 5\' 1"  (1.549 m) 86.2 kg  09/05/19 1415 -- (!) 97.5 F (36.4 C) -- -- -- -- -- --  09/05/19 1400 114/62 -- -- (!) 47 10 99 % -- --  09/05/19 1345 120/64 -- -- (!) 48 13 98 % -- --  09/05/19 1330 126/61 -- -- (!) 45 11 100 % -- --  09/05/19 1315 120/60 -- -- (!) 46 11 99 % -- --  09/05/19 1300 120/60 -- -- (!) 44 11 99 % -- --  09/05/19 1245 119/63 -- -- (!) 47 (!) 21 100 % -- --  09/05/19 1230 117/60 -- -- (!) 50 14 100 % -- --  09/05/19 1215 102/66 -- -- (!) 51 14 100 % -- --  09/05/19 1209 (!) 96/58 (!) 97.5 F (36.4 C) -- (!) 56 11 98 % -- --  09/05/19 0933 -- -- -- 62 10 100 % -- --  09/05/19 0932 -- -- -- (!) 59 10 100 % -- --  09/05/19 0931 -- -- -- 63 12 100 % -- --  09/05/19 0930 121/71 -- -- (!) 58 15 100 % -- --  09/05/19 0929 -- -- -- (!) 58 14 100 % -- --  09/05/19 0928 -- -- -- 60 16 100 % -- --  09/05/19 0927 -- -- -- 67 11 100 % -- --  09/05/19 0926 -- -- -- -- 13 -- -- --  09/05/19 0925 125/84 -- -- -- 17 -- -- --  09/05/19 0924 -- -- -- -- 17 -- -- --  09/05/19 0923 -- -- -- -- 17 -- -- --  09/05/19 0922 -- -- -- -- 15 -- -- --  09/05/19 0921 -- -- -- -- 19 -- -- --  09/05/19 0920 138/68 -- -- -- 17 -- -- --  09/05/19 0919 -- -- -- -- 17 -- -- --  09/05/19 0918 (!) 151/82 -- -- -- 16 -- -- --     Recent laboratory studies: No results for input(s): WBC, HGB, HCT, PLT, NA, K, CL, CO2, BUN, CREATININE, GLUCOSE, INR, CALCIUM in the last 72 hours.  Invalid input(s): PT,  2   Discharge Medications:   Allergies as of 09/06/2019      Reactions   Hydrocodone-acetaminophen Nausea And Vomiting   Tramadol Itching      Medication List    STOP taking these medications   meloxicam 15 MG tablet Commonly known as: MOBIC     TAKE these medications   acetaminophen 500 MG tablet Commonly known as: TYLENOL Take 1,000 mg by mouth every 6 (six) hours as needed for moderate pain.   aspirin 81 MG tablet Take 1 tablet (81 mg total) by mouth daily.   calcium-vitamin D 500-200 MG-UNIT tablet Commonly known as: OSCAL WITH D Take 1 tablet by mouth daily with breakfast.   cetirizine 10 MG tablet Commonly known as: ZYRTEC TAKE 1 TABLET BY MOUTH EVERY DAY   cyclobenzaprine 5 MG tablet Commonly known as: FLEXERIL Take 1 tablet (5 mg total) by mouth 3 (three) times daily as needed for muscle spasms.   diclofenac sodium 1 % Gel Commonly known as: VOLTAREN Apply 2 g topically 4 (four) times daily.   estradiol 0.5 MG tablet Commonly known as: ESTRACE Take 0.5 mg by mouth every Monday, Wednesday, and Friday.   famotidine 20 MG tablet Commonly known as: PEPCID Take 1 tablet (20 mg total) by mouth daily.   fluticasone 50 MCG/ACT nasal spray Commonly known as: FLONASE PLACE 2 SPRAYS INTO BOTH NOSTRILS DAILY. SHAKE LIQUID AND USE 2 SPRAYS IN EACH NOSTRIL DAILY What changed: additional instructions   gabapentin 100 MG capsule Commonly known as: NEURONTIN Take 1 capsule (100 mg total) by mouth at bedtime.   latanoprost 0.005 % ophthalmic solution Commonly known as: XALATAN Place 1 drop into both eyes at bedtime.   omeprazole 40 MG capsule Commonly known as: PRILOSEC TAKE 1 CAPSULE BY MOUTH TWICE DAILY 30 MINUTES BEFORE FIRST AND LAST MEAL OF THE DAY What changed: See the new instructions.   oxyCODONE-acetaminophen 5-325 MG tablet Commonly known as: Percocet Take 1-2 tablets by mouth every 6 (six) hours as needed for severe pain.   potassium chloride SA  20 MEQ tablet Commonly known as: Klor-Con M20  TAKE 1 TABLET(20 MEQ) BY MOUTH DAILY What changed:   how much to take  how to take this  when to take this  additional instructions   predniSONE 20 MG tablet Commonly known as: DELTASONE Take 2 tablets (40 mg total) by mouth daily with breakfast.   spironolactone 25 MG tablet Commonly known as: ALDACTONE TAKE 1 TABLET BY MOUTH TWICE A DAY What changed:   how much to take  how to take this  when to take this  additional instructions   tiZANidine 4 MG tablet Commonly known as: Zanaflex Take 1 tablet (4 mg total) by mouth every 6 (six) hours as needed for muscle spasms.   triamcinolone cream 0.1 % Commonly known as: KENALOG Apply 1 application topically 2 (two) times daily.   Vitamin D3 50 MCG (2000 UT) Tabs Take 2,000 Units by mouth daily.            Durable Medical Equipment  (From admission, onward)         Start     Ordered   09/05/19 1448  DME Walker rolling  Once    Question:  Patient needs a walker to treat with the following condition  Answer:  Primary osteoarthritis of right knee   09/05/19 1447   09/05/19 1448  DME 3 n 1  Once     09/05/19 1447   09/05/19 1448  DME Bedside commode  Once    Question:  Patient needs a bedside commode to treat with the following condition  Answer:  Primary osteoarthritis of right knee   09/05/19 1447          Diagnostic Studies: DG Chest 2 View  Result Date: 08/30/2019 CLINICAL DATA:  Preop knee replacement. EXAM: CHEST - 2 VIEW COMPARISON:  07/14/2015 FINDINGS: Lungs are adequately inflated and otherwise clear. Borderline stable cardiomegaly. Remainder of the exam is unchanged. IMPRESSION: No active cardiopulmonary disease. Electronically Signed   By: Marin Olp M.D.   On: 08/30/2019 08:15    Disposition: Discharge disposition: 01-Home or Self Care       Discharge Instructions    Call MD / Call 911   Complete by: As directed    If you experience chest  pain or shortness of breath, CALL 911 and be transported to the hospital emergency room.  If you develope a fever above 101 F, pus (white drainage) or increased drainage or redness at the wound, or calf pain, call your surgeon's office.   Constipation Prevention   Complete by: As directed    Drink plenty of fluids.  Prune juice may be helpful.  You may use a stool softener, such as Colace (over the counter) 100 mg twice a day.  Use MiraLax (over the counter) for constipation as needed.   Diet - low sodium heart healthy   Complete by: As directed    Discharge instructions   Complete by: As directed    INSTRUCTIONS AFTER JOINT REPLACEMENT   Remove items at home which could result in a fall. This includes throw rugs or furniture in walking pathways ICE to the affected joint every three hours while awake for 30 minutes at a time, for at least the first 3-5 days, and then as needed for pain and swelling.  Continue to use ice for pain and swelling. You may notice swelling that will progress down to the foot and ankle.  This is normal after surgery.  Elevate your leg when you are not up walking on it.  Continue to use the breathing machine you got in the hospital (incentive spirometer) which will help keep your temperature down.  It is common for your temperature to cycle up and down following surgery, especially at night when you are not up moving around and exerting yourself.  The breathing machine keeps your lungs expanded and your temperature down.   DIET:  As you were doing prior to hospitalization, we recommend a well-balanced diet.  DRESSING / WOUND CARE / SHOWERING  You may shower 3 days after surgery, but keep the wounds dry during showering.  You may use an occlusive plastic wrap (Press'n Seal for example), NO SOAKING/SUBMERGING IN THE BATHTUB.  If the bandage gets wet, change with a clean dry gauze.  If the incision gets wet, pat the wound dry with a clean towel.  ACTIVITY  Increase  activity slowly as tolerated, but follow the weight bearing instructions below.   No driving for 6 weeks or until further direction given by your physician.  You cannot drive while taking narcotics.  No lifting or carrying greater than 10 lbs. until further directed by your surgeon. Avoid periods of inactivity such as sitting longer than an hour when not asleep. This helps prevent blood clots.  You may return to work once you are authorized by your doctor.     WEIGHT BEARING   Weight bearing as tolerated with assist device (walker, cane, etc) as directed, use it as long as suggested by your surgeon or therapist, typically at least 4-6 weeks.   EXERCISES  Results after joint replacement surgery are often greatly improved when you follow the exercise, range of motion and muscle strengthening exercises prescribed by your doctor. Safety measures are also important to protect the joint from further injury. Any time any of these exercises cause you to have increased pain or swelling, decrease what you are doing until you are comfortable again and then slowly increase them. If you have problems or questions, call your caregiver or physical therapist for advice.   Rehabilitation is important following a joint replacement. After just a few days of immobilization, the muscles of the leg can become weakened and shrink (atrophy).  These exercises are designed to build up the tone and strength of the thigh and leg muscles and to improve motion. Often times heat used for twenty to thirty minutes before working out will loosen up your tissues and help with improving the range of motion but do not use heat for the first two weeks following surgery (sometimes heat can increase post-operative swelling).   These exercises can be done on a training (exercise) mat, on the floor, on a table or on a bed. Use whatever works the best and is most comfortable for you.    Use music or television while you are exercising so  that the exercises are a pleasant break in your day. This will make your life better with the exercises acting as a break in your routine that you can look forward to.   Perform all exercises about fifteen times, three times per day or as directed.  You should exercise both the operative leg and the other leg as well.   Exercises include:   Quad Sets - Tighten up the muscle on the front of the thigh (Quad) and hold for 5-10 seconds.   Straight Leg Raises - With your knee straight (if you were given a brace, keep it on), lift the leg to 60 degrees, hold for 3 seconds, and slowly  lower the leg.  Perform this exercise against resistance later as your leg gets stronger.  Leg Slides: Lying on your back, slowly slide your foot toward your buttocks, bending your knee up off the floor (only go as far as is comfortable). Then slowly slide your foot back down until your leg is flat on the floor again.  Angel Wings: Lying on your back spread your legs to the side as far apart as you can without causing discomfort.  Hamstring Strength:  Lying on your back, push your heel against the floor with your leg straight by tightening up the muscles of your buttocks.  Repeat, but this time bend your knee to a comfortable angle, and push your heel against the floor.  You may put a pillow under the heel to make it more comfortable if necessary.   A rehabilitation program following joint replacement surgery can speed recovery and prevent re-injury in the future due to weakened muscles. Contact your doctor or a physical therapist for more information on knee rehabilitation.    CONSTIPATION  Constipation is defined medically as fewer than three stools per week and severe constipation as less than one stool per week.  Even if you have a regular bowel pattern at home, your normal regimen is likely to be disrupted due to multiple reasons following surgery.  Combination of anesthesia, postoperative narcotics, change in appetite and  fluid intake all can affect your bowels.   YOU MUST use at least one of the following options; they are listed in order of increasing strength to get the job done.  They are all available over the counter, and you may need to use some, POSSIBLY even all of these options:    Drink plenty of fluids (prune juice may be helpful) and high fiber foods Colace 100 mg by mouth twice a day  Senokot for constipation as directed and as needed Dulcolax (bisacodyl), take with full glass of water  Miralax (polyethylene glycol) once or twice a day as needed.  If you have tried all these things and are unable to have a bowel movement in the first 3-4 days after surgery call either your surgeon or your primary doctor.    If you experience loose stools or diarrhea, hold the medications until you stool forms back up.  If your symptoms do not get better within 1 week or if they get worse, check with your doctor.  If you experience "the worst abdominal pain ever" or develop nausea or vomiting, please contact the office immediately for further recommendations for treatment.   ITCHING:  If you experience itching with your medications, try taking only a single pain pill, or even half a pain pill at a time.  You can also use Benadryl over the counter for itching or also to help with sleep.   TED HOSE STOCKINGS:  Use stockings on both legs until for at least 2 weeks or as directed by physician office. They may be removed at night for sleeping.  MEDICATIONS:  See your medication summary on the "After Visit Summary" that nursing will review with you.  You may have some home medications which will be placed on hold until you complete the course of blood thinner medication.  It is important for you to complete the blood thinner medication as prescribed.  PRECAUTIONS:  If you experience chest pain or shortness of breath - call 911 immediately for transfer to the hospital emergency department.   If you develop a fever greater  that  101 F, purulent drainage from wound, increased redness or drainage from wound, foul odor from the wound/dressing, or calf pain - CONTACT YOUR SURGEON.                                                   FOLLOW-UP APPOINTMENTS:  If you do not already have a post-op appointment, please call the office for an appointment to be seen by your surgeon.  Guidelines for how soon to be seen are listed in your "After Visit Summary", but are typically between 1-4 weeks after surgery.  OTHER INSTRUCTIONS:   Knee Replacement:  Do not place pillow under knee, focus on keeping the knee straight while resting. CPM instructions: 0-90 degrees, 2 hours in the morning, 2 hours in the afternoon, and 2 hours in the evening. Place foam block, curve side up under heel at all times except when in CPM or when walking.  DO NOT modify, tear, cut, or change the foam block in any way.   DENTAL ANTIBIOTICS:  In most cases prophylactic antibiotics for Dental procdeures after total joint surgery are not necessary.  Exceptions are as follows:  1. History of prior total joint infection  2. Severely immunocompromised (Organ Transplant, cancer chemotherapy, Rheumatoid biologic meds such as Humera)  3. Poorly controlled diabetes (A1C &gt; 8.0, blood glucose over 200)  If you have one of these conditions, contact your surgeon for an antibiotic prescription, prior to your dental procedure.   MAKE SURE YOU:  Understand these instructions.  Get help right away if you are not doing well or get worse.    Thank you for letting us be a part of your medical care team.  It is a privilege we respect greatly.  We hope these instructions will help you stay on track for a fast and full recovery!   Increase activity slowly as tolerated   Complete by: As directed       Follow-up Information    Marcene Corning, MD. Schedule an appointment as soon as possible for a visit in 2 weeks.   Specialty: Orthopedic Surgery Contact  information: [address] [place] [address] [phone]            Signed: Ginger Organ Garlen Reinig 09/06/2019, 8:03 AM

## 2019-09-06 NOTE — Progress Notes (Signed)
Subjective: 1 Day Post-Op Procedure(s) (LRB): RIGHT TOTAL KNEE ARTHROPLASTY (Right)   Patient resting in bed. She has been up to the restroom with the nurse but says she has some pain that will shoot in her knee.  Activity level:  wbat Diet tolerance:  ok Voiding:  ok Patient reports pain as mild and moderate.    Objective: Vital signs in last 24 hours: Temp:  [97.5 F (36.4 C)-98.3 F (36.8 C)] 98.1 F (36.7 C) (03/31 0516) Pulse Rate:  [44-67] 57 (03/31 0618) Resp:  [10-21] 20 (03/31 0516) BP: (96-151)/(48-84) 118/76 (03/31 0618) SpO2:  [98 %-100 %] 100 % (03/31 0516) Weight:  [86.2 kg] 86.2 kg (03/30 1430)  Labs: No results for input(s): HGB in the last 72 hours. No results for input(s): WBC, RBC, HCT, PLT in the last 72 hours. No results for input(s): NA, K, CL, CO2, BUN, CREATININE, GLUCOSE, CALCIUM in the last 72 hours. No results for input(s): LABPT, INR in the last 72 hours.  Physical Exam:  Neurologically intact ABD soft Neurovascular intact Sensation intact distally Intact pulses distally Dorsiflexion/Plantar flexion intact Incision: dressing C/D/I and no drainage No cellulitis present Compartment soft  Assessment/Plan:  1 Day Post-Op Procedure(s) (LRB): RIGHT TOTAL KNEE ARTHROPLASTY (Right) Advance diet Up with therapy Discharge home with home health today if cleared by PT and doing well. Continue on 81mg  ASA BID x 2 weeks post op. Follow up in office 2 weeks post op.  Yuya Vanwingerden 09/06/2019, 7:58 AM

## 2019-09-06 NOTE — Progress Notes (Signed)
Physical Therapy Treatment Patient Details Name: Catherine Hoffman MRN: [id number] DOB: Apr 11, 1941 Today's Date: 09/06/2019    History of Present Illness Patient is 79 y.o. female s/p Rt TKA on 09/05/19 with PMH significant for HTN, HLD, glaucoma, GERD, OA, Lt RCR.    PT Comments    POD # 1 am session Assisted OOB with increased time.  General bed mobility comments: demonstarted and instructed how to use a belt to self assist LE off bed. General transfer comment: 50% VC's on proper hand placement and used + 2 assist for safety as pt appeared sleepy/groggy/slow/delayed.  General Gait Details: decreased distance due to sleepy/groggy state.  50% VC's on proper walker to self distance and proper sequencing.  Recliner following for safety. Then returned to room to perform some TE's following HEP handout.  Instructed on proper tech, freq as well as use of ICE.   Pt will need another PT session with family for education .    Follow Up Recommendations  Follow surgeon's recommendation for DC plan and follow-up therapies;SNF     Equipment Recommendations  3in1 (PT)    Recommendations for Other Services       Precautions / Restrictions Precautions Precautions: Fall Precaution Comments: reviewed no pillow under knee Restrictions Weight Bearing Restrictions: No Other Position/Activity Restrictions: WBAT    Mobility  Bed Mobility Overal bed mobility: Needs Assistance Bed Mobility: Supine to Sit     Supine to sit: Mod assist;HOB elevated     General bed mobility comments: demonstarted and instructed how to use a belt to self assist LE off bed  Transfers Overall transfer level: Needs assistance Equipment used: Rolling walker (2 wheeled) Transfers: Sit to/from Stand Sit to Stand: Min assist;From elevated surface         General transfer comment: 50% VC's on proper hand placement and used + 2 assist for safety as pt appeared  sleepy/groggy/slow/delayed.  Ambulation/Gait Ambulation/Gait assistance: Min assist;+2 safety/equipment Gait Distance (Feet): 10 Feet Assistive device: Rolling walker (2 wheeled) Gait Pattern/deviations: Step-to pattern;Decreased stance time - right Gait velocity: decreased   General Gait Details: decreased distance due to sleepy/groggy state.  50% VC's on proper walker to self distance and proper sequencing.  Recliner following for safety.   Stairs             Wheelchair Mobility    Modified Rankin (Stroke Patients Only)       Balance                                            Cognition Arousal/Alertness: Awake/alert Behavior During Therapy: WFL for tasks assessed/performed Overall Cognitive Status: Within Functional Limits for tasks assessed                                 General Comments: slightly sleepy/groggy      Exercises  10 reps AP 10 reps Knee presses 5 reps HS 5 reps hip Abd     General Comments        Pertinent Vitals/Pain Pain Assessment: 0-10 Pain Score: 5  Pain Location: Rt knee Pain Descriptors / Indicators: Aching;Sore;Discomfort;Operative site guarding Pain Intervention(s): Monitored during session;Repositioned;Patient requesting pain meds-RN notified    Home Living  Prior Function            PT Goals (current goals can now be found in the care plan section) Progress towards PT goals: Progressing toward goals    Frequency    7X/week      PT Plan Current plan remains appropriate    Co-evaluation              AM-PAC PT "6 Clicks" Mobility   Outcome Measure  Help needed turning from your back to your side while in a flat bed without using bedrails?: A Little Help needed moving from lying on your back to sitting on the side of a flat bed without using bedrails?: A Little Help needed moving to and from a bed to a chair (including a wheelchair)?: A  Little Help needed standing up from a chair using your arms (e.g., wheelchair or bedside chair)?: A Little Help needed to walk in hospital room?: A Lot Help needed climbing 3-5 steps with a railing? : Total 6 Click Score: 15    End of Session Equipment Utilized During Treatment: Gait belt Activity Tolerance: Other (comment)(groggy) Patient left: in chair;with call bell/phone within reach;with chair alarm set Nurse Communication: Mobility status PT Visit Diagnosis: Muscle weakness (generalized) (M62.81);Difficulty in walking, not elsewhere classified (R26.2)     Time: 0092-3300 PT Time Calculation (min) (ACUTE ONLY): 28 min  Charges:  $Gait Training: 8-22 mins $Therapeutic Activity: 8-22 mins                     Rica Koyanagi  PTA Acute  Rehabilitation Services Pager      [phone] Office      [phone]

## 2019-09-09 LAB — TYPE AND SCREEN
ABO/RH(D): O POS
Antibody Screen: POSITIVE
Donor AG Type: NEGATIVE
Donor AG Type: NEGATIVE
Unit division: 0
Unit division: 0

## 2019-09-09 LAB — BPAM RBC
Blood Product Expiration Date: 202104192359
Blood Product Expiration Date: 202104202359
Unit Type and Rh: 5100
Unit Type and Rh: 5100

## 2019-09-26 IMAGING — DX DG LUMBAR SPINE COMPLETE 4+V
5 series · 5 of 5 positions shown · non-contrast
Comparison: CT 04/20/2018.  MRI 10/20/2016.

CLINICAL DATA: Low back pain.  Left leg pain.

EXAM:
LUMBAR SPINE - COMPLETE 4+ VIEW

[l-spine ap]
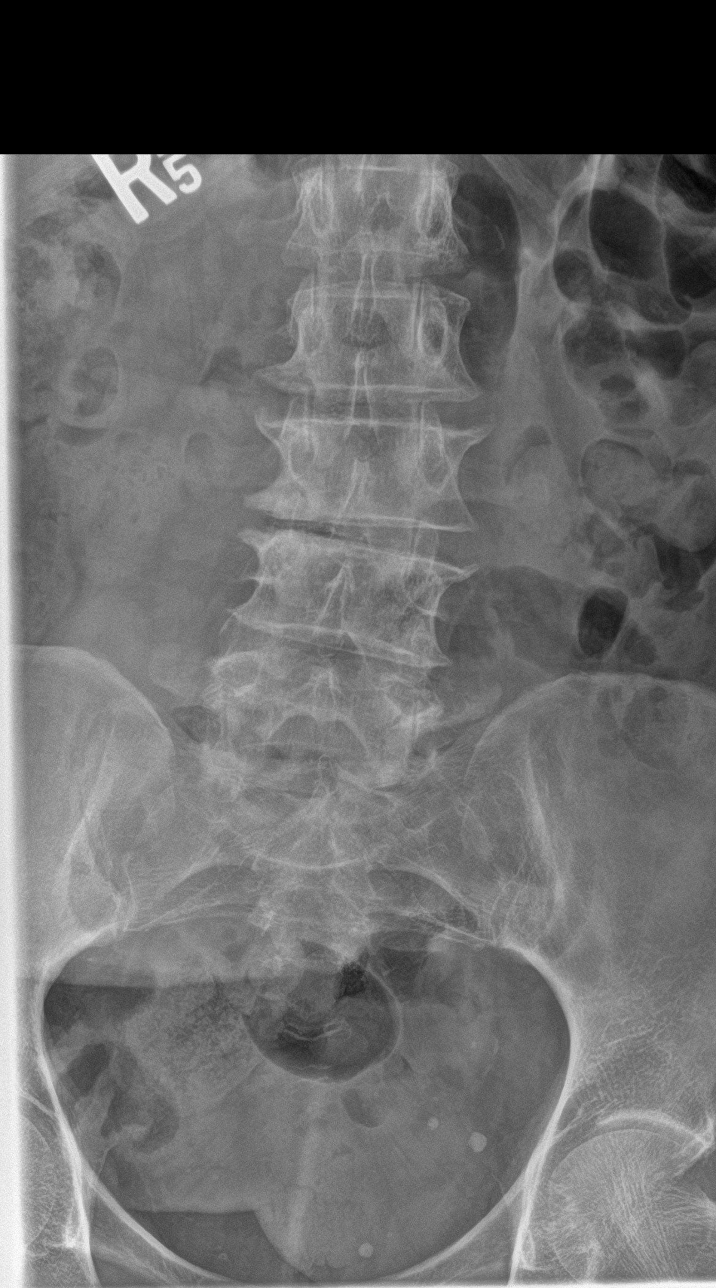

[l-spine obl (1 of 2)]
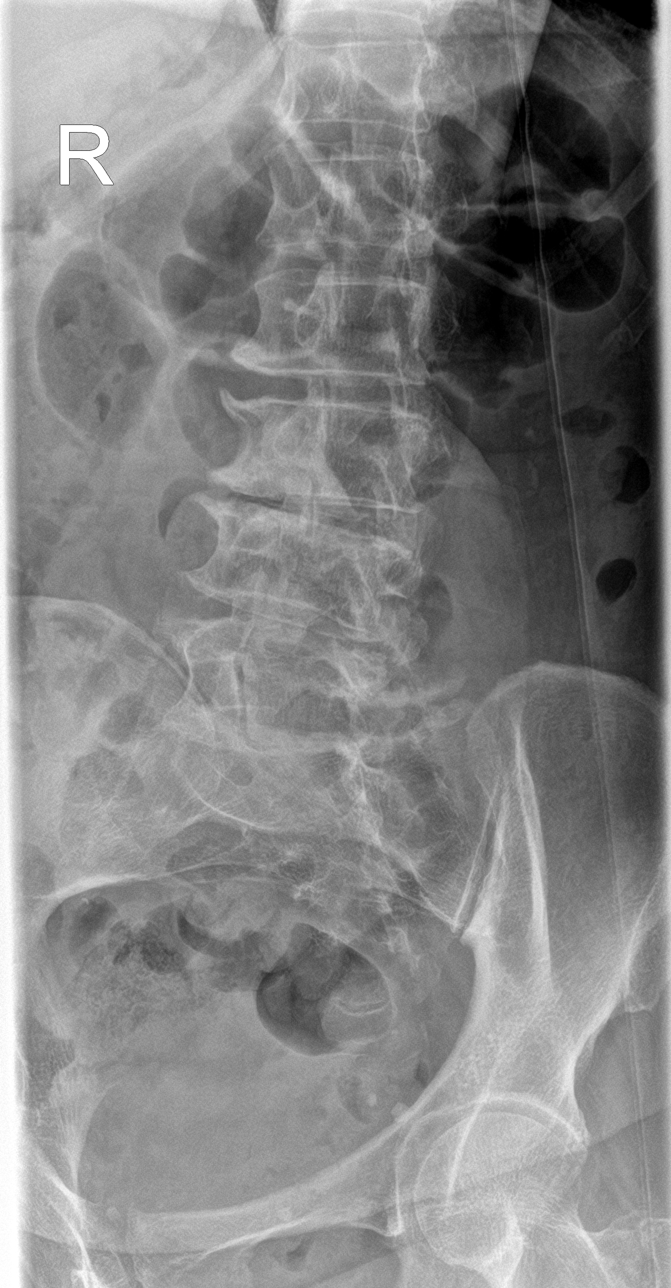

[l-spine obl (2 of 2)]
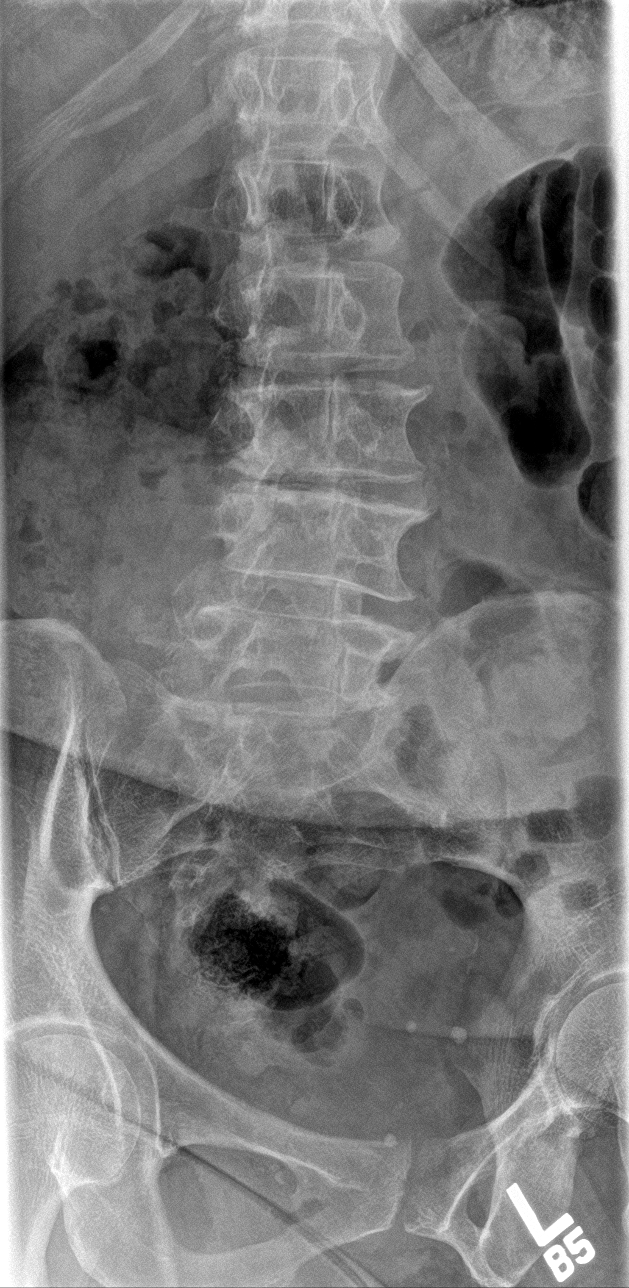

[l-spine lat]
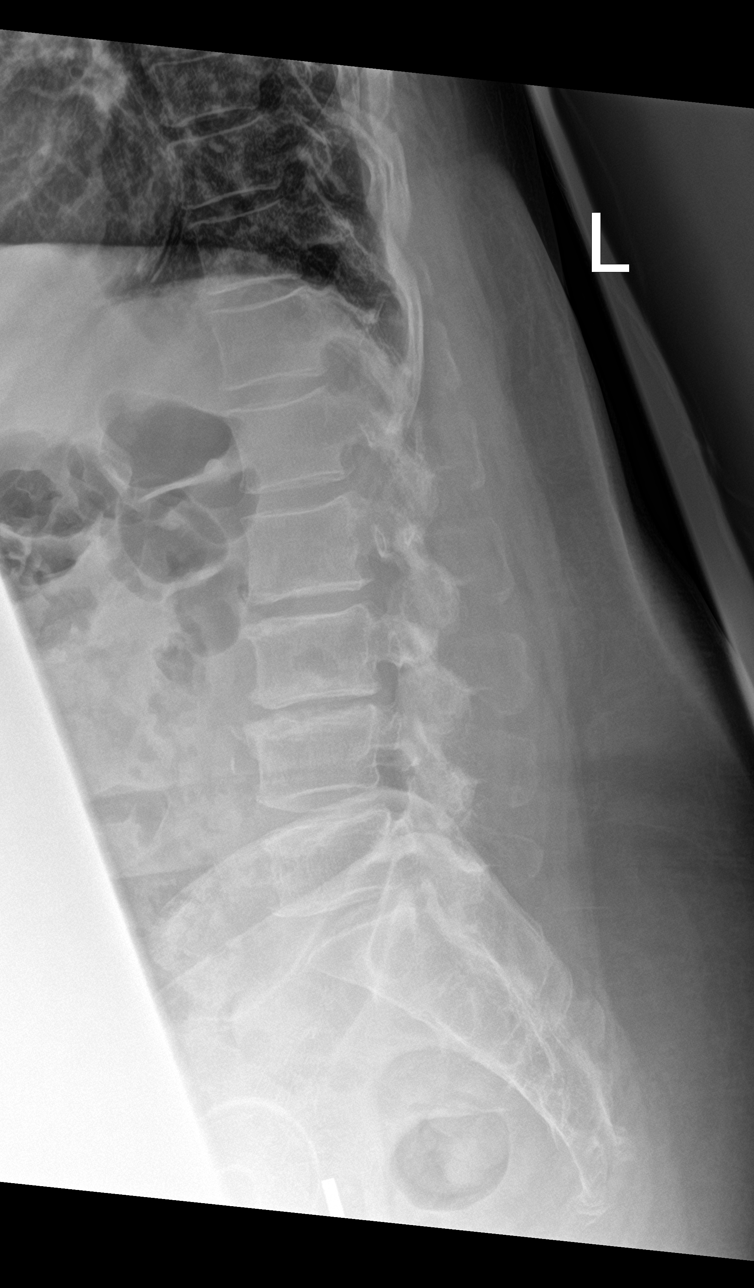

[l-spine spot]
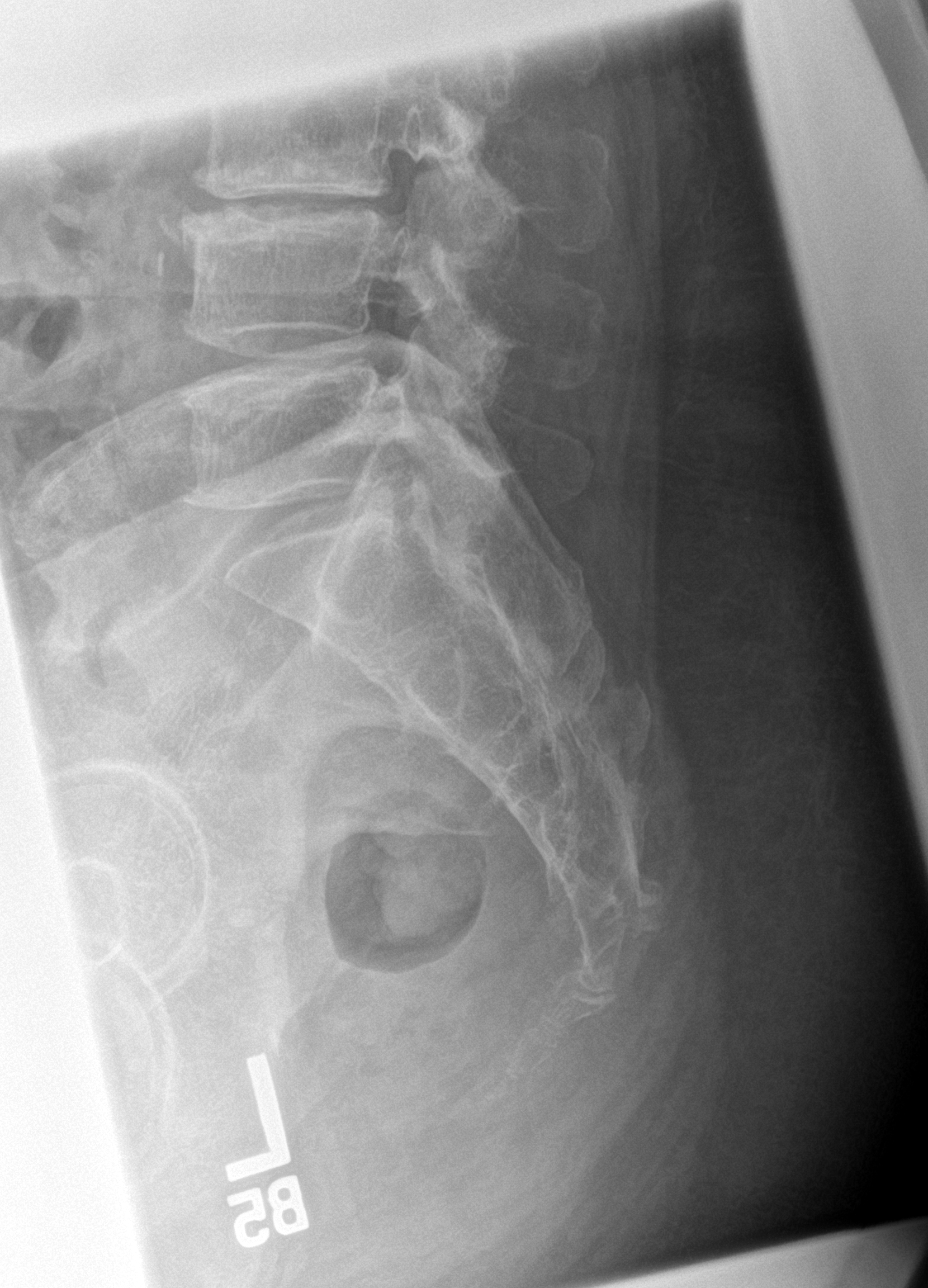

[5 of 5 positions shown; findings below may reference images not displayed]

FINDINGS: Lumbar spine scoliosis concave right. No acute bony abnormality
identified. No evidence of fracture. Pelvic calcifications
consistent phleboliths.
IMPRESSION: Lumbar scoliosis concave right. Diffuse degenerative change. No
acute bony abnormality.

## 2019-09-28 ENCOUNTER — Other Ambulatory Visit: Payer: Self-pay | Admitting: Internal Medicine

## 2020-01-08 ENCOUNTER — Other Ambulatory Visit: Payer: Self-pay | Admitting: Internal Medicine

## 2020-01-13 ENCOUNTER — Other Ambulatory Visit: Payer: Self-pay | Admitting: Internal Medicine

## 2020-01-31 ENCOUNTER — Other Ambulatory Visit: Payer: Self-pay | Admitting: Internal Medicine

## 2020-06-11 ENCOUNTER — Ambulatory Visit: Payer: BC Managed Care – PPO | Admitting: Internal Medicine

## 2020-07-04 ENCOUNTER — Other Ambulatory Visit: Payer: Self-pay | Admitting: Internal Medicine

## 2020-07-27 ENCOUNTER — Other Ambulatory Visit: Payer: Self-pay | Admitting: Internal Medicine

## 2020-07-31 ENCOUNTER — Other Ambulatory Visit: Payer: Self-pay | Admitting: Internal Medicine

## 2020-08-01 ENCOUNTER — Encounter: Payer: Self-pay | Admitting: Internal Medicine

## 2020-08-01 ENCOUNTER — Ambulatory Visit (INDEPENDENT_AMBULATORY_CARE_PROVIDER_SITE_OTHER): Payer: Medicare PPO | Admitting: Internal Medicine

## 2020-08-01 ENCOUNTER — Other Ambulatory Visit: Payer: Self-pay

## 2020-08-01 VITALS — BP 130/78 | HR 62 | Temp 98.3°F | Resp 18 | Ht 61.0 in | Wt 187.2 lb

## 2020-08-01 DIAGNOSIS — Z6835 Body mass index (BMI) 35.0-35.9, adult: Secondary | ICD-10-CM

## 2020-08-01 DIAGNOSIS — Z0000 Encounter for general adult medical examination without abnormal findings: Secondary | ICD-10-CM

## 2020-08-01 DIAGNOSIS — J4520 Mild intermittent asthma, uncomplicated: Secondary | ICD-10-CM | POA: Diagnosis not present

## 2020-08-01 DIAGNOSIS — K219 Gastro-esophageal reflux disease without esophagitis: Secondary | ICD-10-CM

## 2020-08-01 DIAGNOSIS — Z23 Encounter for immunization: Secondary | ICD-10-CM | POA: Diagnosis not present

## 2020-08-01 DIAGNOSIS — I10 Essential (primary) hypertension: Secondary | ICD-10-CM

## 2020-08-01 LAB — COMPREHENSIVE METABOLIC PANEL
ALT: 8 U/L (ref 0–35)
AST: 13 U/L (ref 0–37)
Albumin: 4.1 g/dL (ref 3.5–5.2)
Alkaline Phosphatase: 96 U/L (ref 39–117)
BUN: 21 mg/dL (ref 6–23)
CO2: 23 mEq/L (ref 19–32)
Calcium: 9.4 mg/dL (ref 8.4–10.5)
Chloride: 109 mEq/L (ref 96–112)
Creatinine, Ser: 1.11 mg/dL (ref 0.40–1.20)
GFR: 47.28 mL/min — ABNORMAL LOW (ref >60.00)
Glucose, Bld: 109 mg/dL — ABNORMAL HIGH (ref 70–99)
Potassium: 4 mEq/L (ref 3.5–5.1)
Sodium: 140 mEq/L (ref 135–145)
Total Bilirubin: 0.4 mg/dL (ref 0.2–1.2)
Total Protein: 6.9 g/dL (ref 6.0–8.3)

## 2020-08-01 LAB — CBC
HCT: 37.8 % (ref 36.0–46.0)
Hemoglobin: 12.2 g/dL (ref 12.0–15.0)
MCHC: 32.4 g/dL (ref 30.0–36.0)
MCV: 90.5 fl (ref 78.0–100.0)
Platelets: 170 10*3/uL (ref 150.0–400.0)
RBC: 4.18 Mil/uL (ref 3.87–5.11)
RDW: 13.3 % (ref 11.5–15.5)
WBC: 4.4 10*3/uL (ref 4.0–10.5)

## 2020-08-01 LAB — LIPID PANEL
Cholesterol: 212 mg/dL — ABNORMAL HIGH (ref 0–200)
HDL: 53.5 mg/dL (ref >39.00)
LDL Cholesterol: 148 mg/dL — ABNORMAL HIGH (ref 0–99)
NonHDL: 158.83
Total CHOL/HDL Ratio: 4
Triglycerides: 56 mg/dL (ref 0.0–149.0)
VLDL: 11.2 mg/dL (ref 0.0–40.0)

## 2020-08-01 LAB — HEMOGLOBIN A1C: Hgb A1c MFr Bld: 5.8 % (ref 4.6–6.5)

## 2020-08-01 MED ORDER — CETIRIZINE HCL 10 MG PO TABS: 10.0000 mg | ORAL_TABLET | Freq: Every day | ORAL | 3 refills | Status: DC

## 2020-08-01 MED ORDER — SPIRONOLACTONE 25 MG PO TABS: ORAL_TABLET | ORAL | 3 refills | Status: DC

## 2020-08-01 NOTE — Assessment & Plan Note (Signed)
Using omeprazole and pepcid.

## 2020-08-01 NOTE — Assessment & Plan Note (Signed)
Weight stable and counseled about diet and exercise. ?

## 2020-08-01 NOTE — Patient Instructions (Addendum)
Ask the pharmacy about the shingles vaccine.  Health Maintenance, Female Adopting a healthy lifestyle and getting preventive care are important in promoting health and wellness. Ask your health care provider about:  The right schedule for you to have regular tests and exams.  Things you can do on your own to prevent diseases and keep yourself healthy. What should I know about diet, weight, and exercise? Eat a healthy diet  Eat a diet that includes plenty of vegetables, fruits, low-fat dairy products, and lean protein.  Do not eat a lot of foods that are high in solid fats, added sugars, or sodium.   Maintain a healthy weight Body mass index (BMI) is used to identify weight problems. It estimates body fat based on height and weight. Your health care provider can help determine your BMI and help you achieve or maintain a healthy weight. Get regular exercise Get regular exercise. This is one of the most important things you can do for your health. Most adults should:  Exercise for at least 150 minutes each week. The exercise should increase your heart rate and make you sweat (moderate-intensity exercise).  Do strengthening exercises at least twice a week. This is in addition to the moderate-intensity exercise.  Spend less time sitting. Even light physical activity can be beneficial. Watch cholesterol and blood lipids Have your blood tested for lipids and cholesterol at 80 years of age, then have this test every 5 years. Have your cholesterol levels checked more often if:  Your lipid or cholesterol levels are high.  You are older than 80 years of age.  You are at high risk for heart disease. What should I know about cancer screening? Depending on your health history and family history, you may need to have cancer screening at various ages. This may include screening for:  Breast cancer.  Cervical cancer.  Colorectal cancer.  Skin cancer.  Lung cancer. What should I know about  heart disease, diabetes, and high blood pressure? Blood pressure and heart disease  High blood pressure causes heart disease and increases the risk of stroke. This is more likely to develop in people who have high blood pressure readings, are of African descent, or are overweight.  Have your blood pressure checked: ? Every 3-5 years if you are 15-66 years of age. ? Every year if you are 37 years old or older. Diabetes Have regular diabetes screenings. This checks your fasting blood sugar level. Have the screening done:  Once every three years after age 25 if you are at a normal weight and have a low risk for diabetes.  More often and at a younger age if you are overweight or have a high risk for diabetes. What should I know about preventing infection? Hepatitis B If you have a higher risk for hepatitis B, you should be screened for this virus. Talk with your health care provider to find out if you are at risk for hepatitis B infection. Hepatitis C Testing is recommended for:  Everyone born from [birth year] through 1965.  Anyone with known risk factors for hepatitis C. Sexually transmitted infections (STIs)  Get screened for STIs, including gonorrhea and chlamydia, if: ? You are sexually active and are younger than 80 years of age. ? You are older than 80 years of age and your health care provider tells you that you are at risk for this type of infection. ? Your sexual activity has changed since you were last screened, and you are at increased risk for  chlamydia or gonorrhea. Ask your health care provider if you are at risk.  Ask your health care provider about whether you are at high risk for HIV. Your health care provider may recommend a prescription medicine to help prevent HIV infection. If you choose to take medicine to prevent HIV, you should first get tested for HIV. You should then be tested every 3 months for as long as you are taking the medicine. Pregnancy  If you are about to  stop having your period (premenopausal) and you may become pregnant, seek counseling before you get pregnant.  Take 400 to 800 micrograms (mcg) of folic acid every day if you become pregnant.  Ask for birth control (contraception) if you want to prevent pregnancy. Osteoporosis and menopause Osteoporosis is a disease in which the bones lose minerals and strength with aging. This can result in bone fractures. If you are 63 years old or older, or if you are at risk for osteoporosis and fractures, ask your health care provider if you should:  Be screened for bone loss.  Take a calcium or vitamin D supplement to lower your risk of fractures.  Be given hormone replacement therapy (HRT) to treat symptoms of menopause. Follow these instructions at home: Lifestyle  Do not use any products that contain nicotine or tobacco, such as cigarettes, e-cigarettes, and chewing tobacco. If you need help quitting, ask your health care provider.  Do not use street drugs.  Do not share needles.  Ask your health care provider for help if you need support or information about quitting drugs. Alcohol use  Do not drink alcohol if: ? Your health care provider tells you not to drink. ? You are pregnant, may be pregnant, or are planning to become pregnant.  If you drink alcohol: ? Limit how much you use to 0-1 drink a day. ? Limit intake if you are breastfeeding.  Be aware of how much alcohol is in your drink. In the [place] one drink equals one 12 oz bottle of beer (355 mL), one 5 oz glass of wine (148 mL), or one 1 oz glass of hard liquor (44 mL). General instructions  Schedule regular health, dental, and eye exams.  Stay current with your vaccines.  Tell your health care provider if: ? You often feel depressed. ? You have ever been abused or do not feel safe at home. Summary  Adopting a healthy lifestyle and getting preventive care are important in promoting health and wellness.  Follow your  health care provider's instructions about healthy diet, exercising, and getting tested or screened for diseases.  Follow your health care provider's instructions on monitoring your cholesterol and blood pressure. This information is not intended to replace advice given to you by your health care provider. Make sure you discuss any questions you have with your health care provider. Document Revised: 05/18/2018 Document Reviewed: 05/18/2018 Elsevier Patient Education  2021 [employer].

## 2020-08-01 NOTE — Assessment & Plan Note (Signed)
Flu shot given. Covid-19 up to date. Pneumonia complete. Shingrix counseled to get. Tetanus due 2027. Colonoscopy aged out future. Mammogram aged out future, pap smear aged out and dexa due declines. Counseled about sun safety and mole surveillance. Counseled about the dangers of distracted driving. Given 10 year screening recommendations.

## 2020-08-01 NOTE — Assessment & Plan Note (Signed)
BP at goal on spironolactone. Checking CMP and adjust as needed.

## 2020-08-01 NOTE — Assessment & Plan Note (Signed)
No flare today, using allergy treatment and inhaler with exercise prn.

## 2020-08-01 NOTE — Progress Notes (Signed)
Subjective:   Patient ID: Catherine Hoffman, female    DOB: 08/05/1940, 80 y.o.   MRN: [id number]  HPI Here for medicare wellness and physical, no new complaints. Please see A/P for status and treatment of chronic medical problems.   Diet: heart healthy  Physical activity: sedentary, water aerobics on pause due to covid-19 numbers Depression/mood screen: negative Hearing: intact to whispered voice Visual acuity: grossly normal, reading lens, performs annual eye exam  ADLs: capable Fall risk: none Home safety: good Cognitive evaluation: intact to orientation, naming, recall and repetition EOL planning: adv directives discussed  Flowsheet Row Office Visit from 08/01/2020 in [facility] at [employer]  PHQ-2 Total Score 0      Flowsheet Row Clinical Support from 08/12/2017 in [facility]  PHQ-9 Total Score 2     I have personally reviewed and have noted 1. The patient's medical and social history - reviewed today no changes 2. Their use of alcohol, tobacco or illicit drugs 3. Their current medications and supplements 4. The patient's functional ability including ADL's, fall risks, home safety risks and hearing or visual impairment. 5. Diet and physical activities 6. Evidence for depression or mood disorders 7. Care team reviewed and updated 8.  The patient is not on an opioid pain medication.  Patient Care Team: Myrlene Broker, MD as PCP - General (Internal Medicine) Past Medical History:  Diagnosis Date  . Arthritis   . Cataract    removed both eyes   . Episodic tension type headache   . Esophageal reflux   . GERD (gastroesophageal reflux disease)   . Glaucoma   . Heart murmur    as young adult  . Hyperlipidemia   . Hypertension   . Internal hemorrhoids   . Kidney stone on left side Hx-date unknown  . Left rotator cuff tear    Past Surgical History:  Procedure Laterality Date  . COLONOSCOPY  11/20/2008  . COLONOSCOPY    .  KNEE ARTHROSCOPY Left 2004  . SHOULDER OPEN ROTATOR CUFF REPAIR Left 08/30/2012   Procedure: ROTATOR CUFF REPAIR SHOULDER OPEN, POSSIBLE GRAFT AND ANCHORS;  Surgeon: Jacki Cones, MD;  Location: [facility];  Service: Orthopedics;  Laterality: Left;  . TOTAL ABDOMINAL HYSTERECTOMY W/ BILATERAL SALPINGOOPHORECTOMY  1990's  . TOTAL KNEE ARTHROPLASTY Right 09/05/2019   Procedure: RIGHT TOTAL KNEE ARTHROPLASTY;  Surgeon: Marcene Corning, MD;  Location: [facility];  Service: Orthopedics;  Laterality: Right;   Family History  Problem Relation Age of Onset  . Hypertension Mother   . Diabetes Mother   . Cancer Mother        in leg  . Varicose Veins Maternal Uncle   . Colon cancer Neg Hx   . Colon polyps Neg Hx   . Esophageal cancer Neg Hx   . Stomach cancer Neg Hx   . Rectal cancer Neg Hx    Review of Systems  Constitutional: Negative.   HENT: Negative.   Eyes: Negative.   Respiratory: Negative for cough, chest tightness and shortness of breath.   Cardiovascular: Negative for chest pain, palpitations and leg swelling.  Gastrointestinal: Negative for abdominal distention, abdominal pain, constipation, diarrhea, nausea and vomiting.  Musculoskeletal: Negative.   Skin: Negative.   Neurological: Negative.   Psychiatric/Behavioral: Negative.     Objective:  Physical Exam Constitutional:      Appearance: She is well-developed and well-nourished. She is obese.  HENT:     Head: Normocephalic and atraumatic.  Eyes:     Extraocular Movements: EOM normal.  Cardiovascular:     Rate and Rhythm: Normal rate and regular rhythm.  Pulmonary:     Effort: Pulmonary effort is normal. No respiratory distress.     Breath sounds: Normal breath sounds. No wheezing or rales.  Abdominal:     General: Bowel sounds are normal. There is no distension.     Palpations: Abdomen is soft.     Tenderness: There is no abdominal tenderness. There is no rebound.  Musculoskeletal:        General: No  edema.     Cervical back: Normal range of motion.  Skin:    General: Skin is warm and dry.  Neurological:     Mental Status: She is alert and oriented to person, place, and time.     Coordination: Coordination normal.  Psychiatric:        Mood and Affect: Mood and affect normal.     Vitals:   08/01/20 0935  BP: 130/78  Pulse: 62  Resp: 18  SpO2: 97%  Weight: 187 lb 3.2 oz (84.9 kg)  Height: 5\' 1"  (1.549 m)   This visit occurred during the SARS-CoV-2 public health emergency.  Safety protocols were in place, including screening questions prior to the visit, additional usage of staff PPE, and extensive cleaning of exam room while observing appropriate contact time as indicated for disinfecting solutions.   Assessment & Plan:  Flu shot given at visit

## 2020-08-14 ENCOUNTER — Other Ambulatory Visit: Payer: Self-pay | Admitting: Internal Medicine

## 2020-08-19 ENCOUNTER — Telehealth: Payer: Self-pay | Admitting: Internal Medicine

## 2020-08-19 NOTE — Telephone Encounter (Signed)
Patient calling, states she just got her lab results in the mail and she would like to go on the cholesterol medication but she wants to talk to someone about it first as well as go over the results.  [phone]

## 2020-08-20 NOTE — Telephone Encounter (Signed)
Unable to get in contact with the patient. LVM asking the patient to give our office a call back. I will try to contact the patient later today.

## 2020-09-02 ENCOUNTER — Telehealth: Payer: Self-pay | Admitting: Internal Medicine

## 2020-09-02 NOTE — Telephone Encounter (Signed)
Fyi.

## 2020-09-02 NOTE — Telephone Encounter (Signed)
Caller states she is having pain under her left breast that started last week. States the pain is where her bra goes and it is uncomfortable to lay on that side. States her left arm is painful also and is weak. Pain is 8/10 and has not taken any pain medicine.  Advised to see PCP within 3 days - scheduled 3.29.22

## 2020-09-03 ENCOUNTER — Encounter: Payer: Self-pay | Admitting: Internal Medicine

## 2020-09-03 ENCOUNTER — Other Ambulatory Visit: Payer: Self-pay

## 2020-09-03 ENCOUNTER — Ambulatory Visit (INDEPENDENT_AMBULATORY_CARE_PROVIDER_SITE_OTHER): Payer: Medicare PPO | Admitting: Internal Medicine

## 2020-09-03 VITALS — BP 136/82 | HR 64 | Temp 98.2°F | Resp 18 | Ht 61.0 in | Wt 184.0 lb

## 2020-09-03 DIAGNOSIS — K5904 Chronic idiopathic constipation: Secondary | ICD-10-CM

## 2020-09-03 DIAGNOSIS — R0789 Other chest pain: Secondary | ICD-10-CM | POA: Diagnosis not present

## 2020-09-03 LAB — CBC
HCT: 43.4 % (ref 36.0–46.0)
Hemoglobin: 14.1 g/dL (ref 12.0–15.0)
MCHC: 32.6 g/dL (ref 30.0–36.0)
MCV: 91.3 fl (ref 78.0–100.0)
Platelets: 182 10*3/uL (ref 150.0–400.0)
RBC: 4.76 Mil/uL (ref 3.87–5.11)
RDW: 14.2 % (ref 11.5–15.5)
WBC: 5 10*3/uL (ref 4.0–10.5)

## 2020-09-03 LAB — COMPREHENSIVE METABOLIC PANEL
ALT: 9 U/L (ref 0–35)
AST: 14 U/L (ref 0–37)
Albumin: 4.5 g/dL (ref 3.5–5.2)
Alkaline Phosphatase: 97 U/L (ref 39–117)
BUN: 16 mg/dL (ref 6–23)
CO2: 23 mEq/L (ref 19–32)
Calcium: 10 mg/dL (ref 8.4–10.5)
Chloride: 107 mEq/L (ref 96–112)
Creatinine, Ser: 1.1 mg/dL (ref 0.40–1.20)
GFR: 47.76 mL/min — ABNORMAL LOW (ref >60.00)
Glucose, Bld: 95 mg/dL (ref 70–99)
Potassium: 4.3 mEq/L (ref 3.5–5.1)
Sodium: 140 mEq/L (ref 135–145)
Total Bilirubin: 0.5 mg/dL (ref 0.2–1.2)
Total Protein: 7.6 g/dL (ref 6.0–8.3)

## 2020-09-03 LAB — TROPONIN I (HIGH SENSITIVITY): High Sens Troponin I: 5 ng/L (ref 2–17)

## 2020-09-03 LAB — BRAIN NATRIURETIC PEPTIDE: Pro B Natriuretic peptide (BNP): 15 pg/mL (ref 0.0–100.0)

## 2020-09-03 MED ORDER — CYCLOBENZAPRINE HCL 5 MG PO TABS: 5.0000 mg | ORAL_TABLET | Freq: Three times a day (TID) | ORAL | 1 refills | Status: DC | PRN

## 2020-09-03 MED ORDER — SENNOSIDES-DOCUSATE SODIUM 8.6-50 MG PO TABS: 1.0000 | ORAL_TABLET | Freq: Two times a day (BID) | ORAL | 0 refills | Status: DC

## 2020-09-03 NOTE — Patient Instructions (Signed)
We have sent in flexeril to help with the pain to use up to 3 times a day.   We have sent in senokot-s to take 1 pill twice a day to help the constipation.

## 2020-09-03 NOTE — Progress Notes (Signed)
   Subjective:   Patient ID: Catherine Hoffman, female    DOB: 1940-07-31, 80 y.o.   MRN: [id number]  HPI The patient is a 80 YO female coming in for left chest pain starting Friday after water aerobics. She is having the pain in the left side of the chest and into the left shoulder some. It is tender to touch. Hurts more with bending or moving. She is worried about it. Has also been having some more constipation not sure if this is related. Getting more gas after eating but no nausea or vomiting.   Review of Systems  Constitutional: Negative.   HENT: Negative.   Eyes: Negative.   Respiratory: Negative for cough, chest tightness and shortness of breath.   Cardiovascular: Positive for chest pain. Negative for palpitations and leg swelling.  Gastrointestinal: Positive for constipation. Negative for abdominal distention, abdominal pain, diarrhea, nausea and vomiting.  Musculoskeletal: Positive for myalgias.  Skin: Negative.   Neurological: Negative.   Psychiatric/Behavioral: Negative.     Objective:  Physical Exam Constitutional:      Appearance: She is well-developed.  HENT:     Head: Normocephalic and atraumatic.  Cardiovascular:     Rate and Rhythm: Normal rate and regular rhythm.  Pulmonary:     Effort: Pulmonary effort is normal. No respiratory distress.     Breath sounds: Normal breath sounds. No wheezing or rales.  Chest:     Chest wall: Tenderness present.  Abdominal:     General: Bowel sounds are normal. There is no distension.     Palpations: Abdomen is soft.     Tenderness: There is no abdominal tenderness. There is no rebound.  Musculoskeletal:     Cervical back: Normal range of motion.  Skin:    General: Skin is warm and dry.  Neurological:     Mental Status: She is alert and oriented to person, place, and time.     Coordination: Coordination normal.     Vitals:   09/03/20 1343  BP: 136/82  Pulse: 64  Resp: 18  Temp: 98.2 F (36.8 C)  TempSrc: Oral   SpO2: 98%  Weight: 184 lb (83.5 kg)  Height: 5\' 1"  (1.549 m)   EKG: Rate 60, axis normal, interval normal, sinus, no st or t wave changes, no significant change from 2021  This visit occurred during the SARS-CoV-2 public health emergency.  Safety protocols were in place, including screening questions prior to the visit, additional usage of staff PPE, and extensive cleaning of exam room while observing appropriate contact time as indicated for disinfecting solutions.   Assessment & Plan:  Visit time 25 minutes in face to face communication with patient and coordination of care, additional 10 minutes spent in record review, coordination or care, ordering tests, communicating/referring to other healthcare professionals, documenting in medical records all on the same day of the visit for total time 35 minutes spent on the visit.

## 2020-09-06 DIAGNOSIS — R0789 Other chest pain: Secondary | ICD-10-CM | POA: Insufficient documentation

## 2020-09-06 NOTE — Assessment & Plan Note (Signed)
EKG done, checking troponin and BNP, CBC, CMP, Rx flexeril to help with the pain. This is likely muscular as reproducible on exam but she does have risk factors. EKG is unchanged from prior.

## 2020-09-06 NOTE — Assessment & Plan Note (Signed)
Rx senokot-s to help with the constipation. I do not think this is the cause of the chest pain but may be worsening it.

## 2020-10-28 ENCOUNTER — Telehealth: Payer: Self-pay | Admitting: Internal Medicine

## 2020-10-28 NOTE — Telephone Encounter (Signed)
Ok for me to start filling out handicap placard? Please advise

## 2020-10-28 NOTE — Telephone Encounter (Signed)
Handicap placard has been placed in Catherine Hoffman's box for her signature.

## 2020-10-28 NOTE — Telephone Encounter (Signed)
Fine to fill out. 

## 2020-10-28 NOTE — Telephone Encounter (Signed)
    Patient request form completion of DMV form handicap placard  Please call when complete

## 2020-10-29 NOTE — Telephone Encounter (Signed)
Unable to get in contact with the patient. No option of leaving a voicemail due to the phone constantly ringing. I will attempt to contact later. Completed handicap placard has been placed up front for pick up.   If patient calls please inform her that her handicap placard is ready for pick up

## 2020-10-29 NOTE — Telephone Encounter (Signed)
   Patient would like to pick up form for handicap placard  when complete

## 2020-12-06 ENCOUNTER — Ambulatory Visit: Payer: Medicare PPO | Admitting: Internal Medicine

## 2020-12-06 ENCOUNTER — Other Ambulatory Visit: Payer: Self-pay

## 2020-12-06 ENCOUNTER — Encounter: Payer: Self-pay | Admitting: Internal Medicine

## 2020-12-06 DIAGNOSIS — J0110 Acute frontal sinusitis, unspecified: Secondary | ICD-10-CM | POA: Diagnosis not present

## 2020-12-06 DIAGNOSIS — J019 Acute sinusitis, unspecified: Secondary | ICD-10-CM | POA: Insufficient documentation

## 2020-12-06 MED ORDER — AMOXICILLIN-POT CLAVULANATE 875-125 MG PO TABS: 1.0000 | ORAL_TABLET | Freq: Two times a day (BID) | ORAL | 0 refills | Status: AC

## 2020-12-06 NOTE — Assessment & Plan Note (Signed)
Rx augmentin and advised to continue flonase. Also advised to use afrin prior to flying as she has upcoming trip in 1 week or so.

## 2020-12-06 NOTE — Patient Instructions (Addendum)
We have sent in an antibiotic augmentin to take 1 pill twice a day for a week.  Buy some afrin over the counter nose spray to use when you are sitting on the plane before departure. Use 1-2 sprays in each nostril. I would recommend to do this when leaving and coming home to avoid pressure build up in the sinuses with flying.  Make sure to keep using the nose spray daily until you get back from the trip.

## 2020-12-06 NOTE — Progress Notes (Signed)
   Subjective:   Patient ID: Catherine Hoffman, female    DOB: 1940/11/07, 80 y.o.   MRN: [id number]  HPI The patient is a 80 YO female coming in for dizziness and sinus pain/pressure. Going on 2-3 weeks. Denies fevers or chills. Denies cough. Some drainage. Taking her nose spray she has at home flonase the last 2-3 days and this is helping but not much. She is overall worsening. Dizziness with turning head and has to get up very slowly in the morning.   Review of Systems  Constitutional: Negative.   HENT: Negative.    Eyes: Negative.   Respiratory:  Negative for cough, chest tightness and shortness of breath.   Cardiovascular:  Negative for chest pain, palpitations and leg swelling.  Gastrointestinal:  Negative for abdominal distention, abdominal pain, constipation, diarrhea, nausea and vomiting.  Musculoskeletal: Negative.   Skin: Negative.   Neurological:  Positive for dizziness.  Psychiatric/Behavioral: Negative.     Objective:  Physical Exam Constitutional:      Appearance: She is well-developed.  HENT:     Head: Normocephalic and atraumatic.     Right Ear: Tympanic membrane normal.     Left Ear: Tympanic membrane normal.     Ears:     Comments: Frontal sinus pressure, slight bulging clear fluid bilaterally Cardiovascular:     Rate and Rhythm: Normal rate and regular rhythm.  Pulmonary:     Effort: Pulmonary effort is normal. No respiratory distress.     Breath sounds: Normal breath sounds. No wheezing or rales.  Abdominal:     General: Bowel sounds are normal. There is no distension.     Palpations: Abdomen is soft.     Tenderness: There is no abdominal tenderness. There is no rebound.  Musculoskeletal:     Cervical back: Normal range of motion.  Skin:    General: Skin is warm and dry.  Neurological:     Mental Status: She is alert and oriented to person, place, and time.     Coordination: Coordination normal.    Vitals:   12/06/20 1546  BP: 130/78  Pulse: 75   Resp: 18  Temp: 97.6 F (36.4 C)  TempSrc: Oral  SpO2: 97%  Weight: 185 lb 9.6 oz (84.2 kg)  Height: 5\' 1"  (1.549 m)    This visit occurred during the SARS-CoV-2 public health emergency.  Safety protocols were in place, including screening questions prior to the visit, additional usage of staff PPE, and extensive cleaning of exam room while observing appropriate contact time as indicated for disinfecting solutions.   Assessment & Plan:

## 2020-12-27 DIAGNOSIS — Z01419 Encounter for gynecological examination (general) (routine) without abnormal findings: Secondary | ICD-10-CM | POA: Diagnosis not present

## 2020-12-27 DIAGNOSIS — N959 Unspecified menopausal and perimenopausal disorder: Secondary | ICD-10-CM | POA: Diagnosis not present

## 2020-12-27 DIAGNOSIS — Z1231 Encounter for screening mammogram for malignant neoplasm of breast: Secondary | ICD-10-CM | POA: Diagnosis not present

## 2020-12-29 ENCOUNTER — Other Ambulatory Visit: Payer: Self-pay | Admitting: Internal Medicine

## 2021-01-03 ENCOUNTER — Other Ambulatory Visit: Payer: Self-pay | Admitting: Internal Medicine

## 2021-03-22 ENCOUNTER — Other Ambulatory Visit: Payer: Self-pay | Admitting: Internal Medicine

## 2021-04-15 ENCOUNTER — Other Ambulatory Visit: Payer: Self-pay

## 2021-04-15 ENCOUNTER — Encounter: Payer: Self-pay | Admitting: Internal Medicine

## 2021-04-15 ENCOUNTER — Ambulatory Visit: Payer: Medicare PPO | Admitting: Internal Medicine

## 2021-04-15 DIAGNOSIS — G44201 Tension-type headache, unspecified, intractable: Secondary | ICD-10-CM

## 2021-04-15 DIAGNOSIS — R519 Headache, unspecified: Secondary | ICD-10-CM | POA: Insufficient documentation

## 2021-04-15 DIAGNOSIS — J0110 Acute frontal sinusitis, unspecified: Secondary | ICD-10-CM | POA: Diagnosis not present

## 2021-04-15 LAB — POCT INFLUENZA A/B
Influenza A, POC: NEGATIVE
Influenza B, POC: NEGATIVE

## 2021-04-15 LAB — POC COVID19 BINAXNOW: SARS Coronavirus 2 Ag: NEGATIVE

## 2021-04-15 MED ORDER — KETOROLAC TROMETHAMINE 30 MG/ML IJ SOLN
30.0000 mg | Freq: Once | INTRAMUSCULAR | Status: AC
  Administered 2021-04-15: 30 mg via INTRAMUSCULAR

## 2021-04-15 MED ORDER — METHYLPREDNISOLONE ACETATE 40 MG/ML IJ SUSP
40.0000 mg | Freq: Once | INTRAMUSCULAR | Status: AC
  Administered 2021-04-15: 40 mg via INTRAMUSCULAR

## 2021-04-15 NOTE — Patient Instructions (Addendum)
Your flu and covid-19 testing are negative today.   We have given you a steroid and pain medicine shot today which should help with the sinuses.

## 2021-04-15 NOTE — Progress Notes (Signed)
   Subjective:   Patient ID: Catherine Hoffman, female    DOB: 04/18/1941, 80 y.o.   MRN: [id number]  Headache  Associated symptoms include rhinorrhea and sinus pressure. Pertinent negatives include no abdominal pain, coughing, nausea or vomiting.  The patient is an 80 YO female coming in for headaches and sinus symptoms about 4 days.  Review of Systems  Constitutional: Negative.  Negative for activity change, appetite change and chills.  HENT:  Positive for congestion, rhinorrhea, sinus pressure and sinus pain.   Eyes: Negative.   Respiratory:  Negative for cough, chest tightness and shortness of breath.   Cardiovascular:  Negative for chest pain, palpitations and leg swelling.  Gastrointestinal:  Negative for abdominal distention, abdominal pain, constipation, diarrhea, nausea and vomiting.  Musculoskeletal: Negative.   Skin: Negative.   Neurological:  Positive for headaches.  Psychiatric/Behavioral: Negative.     Objective:  Physical Exam Constitutional:      Appearance: She is well-developed.  HENT:     Head: Normocephalic and atraumatic.     Nose: Congestion present.     Mouth/Throat:     Pharynx: Posterior oropharyngeal erythema present.     Comments: Frontal sinus pressure, no temporal tenderness.  Cardiovascular:     Rate and Rhythm: Normal rate and regular rhythm.  Pulmonary:     Effort: Pulmonary effort is normal. No respiratory distress.     Breath sounds: Normal breath sounds. No wheezing or rales.  Abdominal:     General: Bowel sounds are normal. There is no distension.     Palpations: Abdomen is soft.     Tenderness: There is no abdominal tenderness. There is no rebound.  Musculoskeletal:     Cervical back: Normal range of motion.  Skin:    General: Skin is warm and dry.  Neurological:     Mental Status: She is alert and oriented to person, place, and time.     Coordination: Coordination normal.    Vitals:   04/15/21 1559  BP: 136/80  Pulse: 74  Resp:  18  SpO2: 96%  Weight: 184 lb 3.2 oz (83.6 kg)  Height: 5\' 1"  (1.549 m)   POC Flu Aand B negative  POC covid-19 negative  This visit occurred during the SARS-CoV-2 public health emergency.  Safety protocols were in place, including screening questions prior to the visit, additional usage of staff PPE, and extensive cleaning of exam room while observing appropriate contact time as indicated for disinfecting solutions.   Assessment & Plan:  Toradol 30 mg IM and Depo-medrol 40 mg IM

## 2021-04-15 NOTE — Assessment & Plan Note (Signed)
Given new acute headache and sinus symptoms and within window for covid-19 treatment rapid flu and covid-19 testing was done in the office.

## 2021-04-18 NOTE — Assessment & Plan Note (Signed)
Given depo-medrol 40 mg IM and toradol 30 mg IM. POC flu and covid-19 done at office and negative. Likely other viral illness.

## 2021-06-30 ENCOUNTER — Other Ambulatory Visit: Payer: Self-pay | Admitting: Internal Medicine

## 2021-07-06 ENCOUNTER — Other Ambulatory Visit: Payer: Self-pay | Admitting: Internal Medicine

## 2021-08-05 ENCOUNTER — Ambulatory Visit: Payer: Medicare PPO | Admitting: Internal Medicine

## 2021-08-05 ENCOUNTER — Other Ambulatory Visit: Payer: Self-pay

## 2021-08-05 ENCOUNTER — Encounter: Payer: Self-pay | Admitting: Internal Medicine

## 2021-08-05 VITALS — BP 126/72 | HR 67 | Resp 18 | Ht 61.0 in | Wt 185.4 lb

## 2021-08-05 DIAGNOSIS — N644 Mastodynia: Secondary | ICD-10-CM | POA: Diagnosis not present

## 2021-08-05 NOTE — Assessment & Plan Note (Signed)
Gets mammogram through gyn and recent within 1 year. 1 spot of blood from nipple in December and pain since then intermittent with lying on that side. She has been moving and suspect musculoskeletal from chest wall. No masses detected. Ordered diagnostic mammogram and [place] to rule out malignancy or mass.

## 2021-08-05 NOTE — Patient Instructions (Signed)
We will get the ultraound and diagnostic mammogram done to check the breasts.

## 2021-08-05 NOTE — Progress Notes (Signed)
° °  Subjective:   Patient ID: Catherine Hoffman, female    DOB: 04-30-1941, 81 y.o.   MRN: [id number]  HPI The patient is an 81 YO female coming in for left breast pain.  Review of Systems  Constitutional: Negative.   HENT: Negative.    Eyes: Negative.   Respiratory:  Negative for cough, chest tightness and shortness of breath.   Cardiovascular:  Negative for chest pain, palpitations and leg swelling.  Gastrointestinal:  Negative for abdominal distention, abdominal pain, constipation, diarrhea, nausea and vomiting.  Genitourinary:        Breast pain left  Musculoskeletal: Negative.   Skin: Negative.   Neurological: Negative.   Psychiatric/Behavioral: Negative.     Objective:  Physical Exam Constitutional:      Appearance: She is well-developed.  HENT:     Head: Normocephalic and atraumatic.  Cardiovascular:     Rate and Rhythm: Normal rate and regular rhythm.     Comments: Breast exam normal no masses or LN detected  Pulmonary:     Effort: Pulmonary effort is normal. No respiratory distress.     Breath sounds: Normal breath sounds. No wheezing or rales.  Abdominal:     General: Bowel sounds are normal. There is no distension.     Palpations: Abdomen is soft.     Tenderness: There is no abdominal tenderness. There is no rebound.  Musculoskeletal:     Cervical back: Normal range of motion.  Skin:    General: Skin is warm and dry.  Neurological:     Mental Status: She is alert and oriented to person, place, and time.     Coordination: Coordination normal.    Vitals:   08/05/21 0955  BP: 126/72  Pulse: 67  Resp: 18  SpO2: 97%  Weight: 185 lb 6.4 oz (84.1 kg)  Height: 5\' 1"  (1.549 m)    This visit occurred during the SARS-CoV-2 public health emergency.  Safety protocols were in place, including screening questions prior to the visit, additional usage of staff PPE, and extensive cleaning of exam room while observing appropriate contact time as indicated for disinfecting  solutions.   Assessment & Plan:

## 2021-08-06 ENCOUNTER — Telehealth: Payer: Self-pay | Admitting: Internal Medicine

## 2021-08-06 NOTE — Telephone Encounter (Signed)
Nevaeh from [facility] calls today in regards to PT's order for tomorrow's exam. Colletta Maryland was finishing updating the PT's information aswell as filling out the exam, and Dr.Crawford's cosign had disappeared when she finished. PT's exam is at 11:00 am (08/07/21) so she will need that cosign put back in before then if possible! ?

## 2021-08-07 ENCOUNTER — Ambulatory Visit: Payer: Medicare PPO

## 2021-08-07 ENCOUNTER — Ambulatory Visit
Admission: RE | Admit: 2021-08-07 | Discharge: 2021-08-07 | Disposition: A | Discharge status: Home / Self Care | Payer: Medicare PPO | Source: Ambulatory Visit | Attending: Internal Medicine | Admitting: Internal Medicine

## 2021-08-07 ENCOUNTER — Other Ambulatory Visit: Payer: Self-pay

## 2021-08-07 DIAGNOSIS — N644 Mastodynia: Secondary | ICD-10-CM

## 2021-08-07 DIAGNOSIS — R922 Inconclusive mammogram: Secondary | ICD-10-CM | POA: Diagnosis not present

## 2021-08-07 NOTE — Telephone Encounter (Signed)
Can you call and find out if anything is needed? I have co-signed all orders in epic ?

## 2021-08-15 ENCOUNTER — Ambulatory Visit: Payer: Medicare PPO

## 2021-08-15 ENCOUNTER — Telehealth: Payer: Self-pay

## 2021-08-15 NOTE — Telephone Encounter (Signed)
Called patient x 4 no answer no VM . ?Patient amy reschedule for next appointment . ? ?L.Teddrick Mallari,LPN  ?

## 2021-08-26 ENCOUNTER — Other Ambulatory Visit: Payer: Self-pay | Admitting: Internal Medicine

## 2021-09-19 ENCOUNTER — Other Ambulatory Visit: Payer: Self-pay | Admitting: Internal Medicine

## 2021-11-17 NOTE — Progress Notes (Signed)
[facility] [place] Phone: [phone] Subjective:   Fontaine No, am serving as a scribe for Dr. Hulan Saas.   I'm seeing this patient by the request  of:  Hoyt Koch, MD  CC: left knee and right shoulder   [id number]  Catherine Hoffman is a 81 y.o. female coming in with complaint of L knee and R shoulder pain. Knee gave out on her and she fell. Believes it was [place] injury. Having hard time to wash dishes. Feels better if she has arm adducted across body. Pain in R shoulder and R elbow.     Past Medical History:  Diagnosis Date   Arthritis    Cataract    removed both eyes    Episodic tension type headache    Esophageal reflux    GERD (gastroesophageal reflux disease)    Glaucoma    Heart murmur    as young adult   Hyperlipidemia    Hypertension    Internal hemorrhoids    Kidney stone on left side Hx-date unknown   Left rotator cuff tear    Past Surgical History:  Procedure Laterality Date   COLONOSCOPY  11/20/2008   COLONOSCOPY     KNEE ARTHROSCOPY Left 2004   SHOULDER OPEN ROTATOR CUFF REPAIR Left 08/30/2012   Procedure: ROTATOR CUFF REPAIR SHOULDER OPEN, POSSIBLE GRAFT AND ANCHORS;  Surgeon: Tobi Bastos, MD;  Location: [facility];  Service: Orthopedics;  Laterality: Left;   TOTAL ABDOMINAL HYSTERECTOMY W/ BILATERAL SALPINGOOPHORECTOMY  1990's   TOTAL KNEE ARTHROPLASTY Right 09/05/2019   Procedure: RIGHT TOTAL KNEE ARTHROPLASTY;  Surgeon: Melrose Nakayama, MD;  Location: [facility];  Service: Orthopedics;  Laterality: Right;   Social History   Socioeconomic History   Marital status: Widowed    Spouse name: Not on file   Number of children: 2   Years of education: Not on file   Highest education level: Not on file  Occupational History   Occupation: Teacher, adult education  Tobacco Use   Smoking status: Never   Smokeless tobacco: Never  Vaping Use   Vaping Use: Never used   Substance and Sexual Activity   Alcohol use: No   Drug use: No   Sexual activity: Never  Other Topics Concern   Not on file  Social History Narrative   Not on file   Social Determinants of Health   Financial Resource Strain: Low Risk  (08/12/2017)   Overall Financial Resource Strain (CARDIA)    Difficulty of Paying Living Expenses: Not hard at all  Food Insecurity: No Food Insecurity (08/12/2017)   Hunger Vital Sign    Worried About Running Out of Food in the Last Year: Never true    [place] in the Last Year: Never true  Transportation Needs: No Transportation Needs (08/12/2017)   PRAPARE - Hydrologist (Medical): No    Lack of Transportation (Non-Medical): No  Physical Activity: Inactive (08/12/2017)   Exercise Vital Sign    Days of Exercise per Week: 0 days    Minutes of Exercise per Session: 0 min  Stress: No Stress Concern Present (08/12/2017)   [place]    Feeling of Stress : Not at all  Social Connections: Moderately Integrated (08/12/2017)   Social Connection and Isolation Panel [NHANES]    Frequency of Communication with Friends and Family: More than three times  a week    Frequency of Social Gatherings with Friends and Family: More than three times a week    Attends Religious Services: More than 4 times per year    Active Member of Clubs or Organizations: Not on file    Attends Archivist Meetings: More than 4 times per year    Marital Status: Widowed   Allergies  Allergen Reactions   Hydrocodone-Acetaminophen Nausea And Vomiting   Tramadol Itching   Family History  Problem Relation Age of Onset   Hypertension Mother    Diabetes Mother    Cancer Mother        in leg   Varicose Veins Maternal Uncle    Colon cancer Neg Hx    Colon polyps Neg Hx    Esophageal cancer Neg Hx    Stomach cancer Neg Hx    Rectal cancer Neg Hx     Current Outpatient  Medications (Endocrine & Metabolic):    estradiol (ESTRACE) 0.5 MG tablet, Take 0.5 mg by mouth every Monday, Wednesday, and Friday.   Current Outpatient Medications (Cardiovascular):    spironolactone (ALDACTONE) 25 MG tablet, TAKE 1 TABLET BY MOUTH TWICE A DAY  Current Outpatient Medications (Respiratory):    cetirizine (ZYRTEC) 10 MG tablet, TAKE 1 TABLET BY MOUTH EVERY DAY   fluticasone (FLONASE) 50 MCG/ACT nasal spray, PLACE 2 SPRAYS INTO BOTH NOSTRILS DAILY. SHAKE LIQUID AND USE 2 SPRAYS IN EACH NOSTRIL DAILY  Current Outpatient Medications (Analgesics):    acetaminophen (TYLENOL) 500 MG tablet, Take 1,000 mg by mouth every 6 (six) hours as needed for moderate pain.    [employer] ASPIRIN ADULT LOW DOSE 81 MG chewable tablet, TAKE 1 TABLET BY MOUTH EVERY DAY   meloxicam (MOBIC) 15 MG tablet, TAKE 1 TABLET BY MOUTH EVERY DAY   Current Outpatient Medications (Other):    calcium-vitamin D (OSCAL WITH D) 500-200 MG-UNIT per tablet, Take 1 tablet by mouth daily with breakfast.   Cholecalciferol (VITAMIN D3) 50 MCG (2000 UT) TABS, Take 2,000 Units by mouth daily.    [facility] SENNA PLUS 8.6-50 MG tablet, TAKE 1 TABLET BY MOUTH TWICE A DAY   cyclobenzaprine (FLEXERIL) 5 MG tablet, Take 1 tablet (5 mg total) by mouth 3 (three) times daily as needed for muscle spasms.   diclofenac sodium (VOLTAREN) 1 % GEL, Apply 2 g topically 4 (four) times daily.   famotidine (PEPCID) 20 MG tablet, Take 1 tablet (20 mg total) by mouth daily.   gabapentin (NEURONTIN) 100 MG capsule, Take 1 capsule (100 mg total) by mouth at bedtime.   latanoprost (XALATAN) 0.005 % ophthalmic solution, Place 1 drop into both eyes at bedtime.   omeprazole (PRILOSEC) 40 MG capsule, TAKE 1 CAPSULE BY MOUTH TWICE DAILY 30 MINUTES BEFORE FIRST AND LAST MEAL OF THE DAY   potassium chloride SA (KLOR-CON M20) 20 MEQ tablet, TAKE 1 TABLET BY MOUTH EVERY DAY   triamcinolone cream (KENALOG) 0.1 %, Apply 1 application topically 2 (two) times  daily.   Reviewed prior external information including notes and imaging from  primary care provider As well as notes that were available from care everywhere and other healthcare systems.  Past medical history, social, surgical and family history all reviewed in electronic medical record.  No pertanent information unless stated regarding to the chief complaint.   Review of Systems:  No headache, visual changes, nausea, vomiting, diarrhea, constipation, dizziness, abdominal pain, skin rash, fevers, chills, night sweats, weight loss, swollen lymph nodes, body aches, joint  swelling, chest pain, shortness of breath, mood changes. POSITIVE muscle aches  Objective  Blood pressure 126/82, pulse 76, height 5\' 1"  (1.549 m), weight 185 lb (83.9 kg), SpO2 96 %.   General: No apparent distress alert and oriented x3 mood and affect normal, dressed appropriately.  HEENT: Pupils equal, extraocular movements intact  Respiratory: Patient's speak in full sentences and does not appear short of breath  Cardiovascular: Trace lower extremity edema, non tender, no erythema  Patient's right elbow is swollen compared to the contralateral side.  Patient has significant supination pronation but secondary to voluntary guarding.  Patient is extension of only 20 degrees once again secondary to voluntary guarding. Neurovascular intact distally.  Left knee does have varus deformity noted at the knee.  Patient does have instability with valgus and varus force.  Patient does have some crepitus noted.  Lacks last 5 degrees of flexion and extension   Impression and Recommendations:     The above documentation has been reviewed and is accurate and complete Lyndal Pulley, DO

## 2021-11-18 ENCOUNTER — Ambulatory Visit: Payer: Medicare PPO | Admitting: Family Medicine

## 2021-11-18 ENCOUNTER — Ambulatory Visit (INDEPENDENT_AMBULATORY_CARE_PROVIDER_SITE_OTHER): Payer: Medicare PPO

## 2021-11-18 ENCOUNTER — Encounter: Payer: Self-pay | Admitting: Family Medicine

## 2021-11-18 VITALS — BP 126/82 | HR 76 | Ht 61.0 in | Wt 185.0 lb

## 2021-11-18 DIAGNOSIS — M25521 Pain in right elbow: Secondary | ICD-10-CM | POA: Diagnosis not present

## 2021-11-18 DIAGNOSIS — G8929 Other chronic pain: Secondary | ICD-10-CM | POA: Diagnosis not present

## 2021-11-18 DIAGNOSIS — M25511 Pain in right shoulder: Secondary | ICD-10-CM

## 2021-11-18 DIAGNOSIS — S52124A Nondisplaced fracture of head of right radius, initial encounter for closed fracture: Secondary | ICD-10-CM | POA: Diagnosis not present

## 2021-11-18 DIAGNOSIS — S52121A Displaced fracture of head of right radius, initial encounter for closed fracture: Secondary | ICD-10-CM | POA: Diagnosis not present

## 2021-11-18 DIAGNOSIS — M25562 Pain in left knee: Secondary | ICD-10-CM

## 2021-11-18 DIAGNOSIS — S52123A Displaced fracture of head of unspecified radius, initial encounter for closed fracture: Secondary | ICD-10-CM | POA: Insufficient documentation

## 2021-11-18 DIAGNOSIS — M1712 Unilateral primary osteoarthritis, left knee: Secondary | ICD-10-CM | POA: Insufficient documentation

## 2021-11-18 NOTE — Assessment & Plan Note (Signed)
X-ray was done today and did show the patient to have what appears to be a nondisplaced radial head fracture.  Trace effusion noted.  Patient will be put in a sling and will start with range of motion exercises in the next 2 weeks.  Follow-up in 2 weeks to repeat x-rays.

## 2021-11-18 NOTE — Assessment & Plan Note (Signed)
Arthritic changes seems to be more in the patellofemoral and medial joint space.  Patient does have instability noted and abnormal thigh to calf ratio.  Do believe the patient could respond well to a over a stability brace with a Tru pull lite.  Discussed home exercises, home exercises could potentially be a candidate for injections if necessary.  Patient has had a knee replacement on the contralateral side possibly would be beneficial.  Follow-up with me again 6 to 8 weeks

## 2021-11-18 NOTE — Patient Instructions (Signed)
Sling as much as possible Range of motion exercises Thurmond Butts will call you See you in 2 weeks ok to double book

## 2021-11-26 NOTE — Progress Notes (Unsigned)
[name] [address] [place] [address] Phone: [phone] Subjective:    I'm seeing this patient by the request  of:  Myrlene Broker, MD  CC:   [id number]  11/18/2021 X-ray was done today and did show the patient to have what appears to be a nondisplaced radial head fracture.  Trace effusion noted.  Patient will be put in a sling and will start with range of motion exercises in the next 2 weeks.  Follow-up in 2 weeks to repeat x-rays.  Arthritic changes seems to be more in the patellofemoral and medial joint space.  Patient does have instability noted and abnormal thigh to calf ratio.  Do believe the patient could respond well to a over a stability brace with a Tru pull lite.  Discussed home exercises, home exercises could potentially be a candidate for injections if necessary.  Patient has had a knee replacement on the contralateral side possibly would be beneficial.  Follow-up with me again 6 to 8 weeks  Updated 12/02/2021 Catherine Hoffman is a 81 y.o. female coming in with complaint of right shoulder and left knee pain       Past Medical History:  Diagnosis Date   Arthritis    Cataract    removed both eyes    Episodic tension type headache    Esophageal reflux    GERD (gastroesophageal reflux disease)    Glaucoma    Heart murmur    as young adult   Hyperlipidemia    Hypertension    Internal hemorrhoids    Kidney stone on left side Hx-date unknown   Left rotator cuff tear    Past Surgical History:  Procedure Laterality Date   COLONOSCOPY  11/20/2008   COLONOSCOPY     KNEE ARTHROSCOPY Left 2004   SHOULDER OPEN ROTATOR CUFF REPAIR Left 08/30/2012   Procedure: ROTATOR CUFF REPAIR SHOULDER OPEN, POSSIBLE GRAFT AND ANCHORS;  Surgeon: Jacki Cones, MD;  Location: [facility] [place];  Service: Orthopedics;  Laterality: Left;   TOTAL ABDOMINAL HYSTERECTOMY W/ BILATERAL SALPINGOOPHORECTOMY  1990's   TOTAL KNEE  ARTHROPLASTY Right 09/05/2019   Procedure: RIGHT TOTAL KNEE ARTHROPLASTY;  Surgeon: Marcene Corning, MD;  Location: [facility];  Service: Orthopedics;  Laterality: Right;   Social History   Socioeconomic History   Marital status: Widowed    Spouse name: Not on file   Number of children: 2   Years of education: Not on file   Highest education level: Not on file  Occupational History   Occupation: Lexicographer  Tobacco Use   Smoking status: Never   Smokeless tobacco: Never  Vaping Use   Vaping Use: Never used  Substance and Sexual Activity   Alcohol use: No   Drug use: No   Sexual activity: Never  Other Topics Concern   Not on file  Social History Narrative   Not on file   Social Determinants of Health   Financial Resource Strain: Low Risk  (08/12/2017)   Overall Financial Resource Strain (CARDIA)    Difficulty of Paying Living Expenses: Not hard at all  Food Insecurity: No Food Insecurity (08/12/2017)   Hunger Vital Sign    Worried About Running Out of Food in the Last Year: Never true    Ran Out of Food in the Last Year: Never true  Transportation Needs: No Transportation Needs (08/12/2017)   PRAPARE - Transportation    Lack of Transportation (Medical): No    Lack  of Transportation (Non-Medical): No  Physical Activity: Inactive (08/12/2017)   Exercise Vital Sign    Days of Exercise per Week: 0 days    Minutes of Exercise per Session: 0 min  Stress: No Stress Concern Present (08/12/2017)   [employer] of Occupational Health - Occupational Stress Questionnaire    Feeling of Stress : Not at all  Social Connections: Moderately Integrated (08/12/2017)   Social Connection and Isolation Panel [NHANES]    Frequency of Communication with Friends and Family: More than three times a week    Frequency of Social Gatherings with Friends and Family: More than three times a week    Attends Religious Services: More than 4 times per year    Active Member of [employer] or Organizations: Not on file     Attends Banker Meetings: More than 4 times per year    Marital Status: Widowed   Allergies  Allergen Reactions   Hydrocodone-Acetaminophen Nausea And Vomiting   Tramadol Itching   Family History  Problem Relation Age of Onset   Hypertension Mother    Diabetes Mother    Cancer Mother        in leg   Varicose Veins Maternal Uncle    Colon cancer Neg Hx    Colon polyps Neg Hx    Esophageal cancer Neg Hx    Stomach cancer Neg Hx    Rectal cancer Neg Hx     Current Outpatient Medications (Endocrine & Metabolic):    estradiol (ESTRACE) 0.5 MG tablet, Take 0.5 mg by mouth every Monday, Wednesday, and Friday.   Current Outpatient Medications (Cardiovascular):    spironolactone (ALDACTONE) 25 MG tablet, TAKE 1 TABLET BY MOUTH TWICE A DAY  Current Outpatient Medications (Respiratory):    cetirizine (ZYRTEC) 10 MG tablet, TAKE 1 TABLET BY MOUTH EVERY DAY   fluticasone (FLONASE) 50 MCG/ACT nasal spray, PLACE 2 SPRAYS INTO BOTH NOSTRILS DAILY. SHAKE LIQUID AND USE 2 SPRAYS IN EACH NOSTRIL DAILY  Current Outpatient Medications (Analgesics):    acetaminophen (TYLENOL) 500 MG tablet, Take 1,000 mg by mouth every 6 (six) hours as needed for moderate pain.    [employer]S ASPIRIN ADULT LOW DOSE 81 MG chewable tablet, TAKE 1 TABLET BY MOUTH EVERY DAY   meloxicam (MOBIC) 15 MG tablet, TAKE 1 TABLET BY MOUTH EVERY DAY   Current Outpatient Medications (Other):    calcium-vitamin D (OSCAL WITH D) 500-200 MG-UNIT per tablet, Take 1 tablet by mouth daily with breakfast.   Cholecalciferol (VITAMIN D3) 50 MCG (2000 UT) TABS, Take 2,000 Units by mouth daily.    [facility]S SENNA PLUS 8.6-50 MG tablet, TAKE 1 TABLET BY MOUTH TWICE A DAY   cyclobenzaprine (FLEXERIL) 5 MG tablet, Take 1 tablet (5 mg total) by mouth 3 (three) times daily as needed for muscle spasms.   diclofenac sodium (VOLTAREN) 1 % GEL, Apply 2 g topically 4 (four) times daily.   famotidine (PEPCID) 20 MG tablet, Take 1 tablet (20 mg  total) by mouth daily.   gabapentin (NEURONTIN) 100 MG capsule, Take 1 capsule (100 mg total) by mouth at bedtime.   latanoprost (XALATAN) 0.005 % ophthalmic solution, Place 1 drop into both eyes at bedtime.   omeprazole (PRILOSEC) 40 MG capsule, TAKE 1 CAPSULE BY MOUTH TWICE DAILY 30 MINUTES BEFORE FIRST AND LAST MEAL OF THE DAY   potassium chloride SA (KLOR-CON M20) 20 MEQ tablet, TAKE 1 TABLET BY MOUTH EVERY DAY   triamcinolone cream (KENALOG) 0.1 %, Apply 1 application topically  2 (two) times daily.   Reviewed prior external information including notes and imaging from  primary care provider As well as notes that were available from care everywhere and other healthcare systems.  Past medical history, social, surgical and family history all reviewed in electronic medical record.  No pertanent information unless stated regarding to the chief complaint.   Review of Systems:  No headache, visual changes, nausea, vomiting, diarrhea, constipation, dizziness, abdominal pain, skin rash, fevers, chills, night sweats, weight loss, swollen lymph nodes, body aches, joint swelling, chest pain, shortness of breath, mood changes. POSITIVE muscle aches  Objective  There were no vitals taken for this visit.   General: No apparent distress alert and oriented x3 mood and affect normal, dressed appropriately.  HEENT: Pupils equal, extraocular movements intact  Respiratory: Patient's speak in full sentences and does not appear short of breath  Cardiovascular: No lower extremity edema, non tender, no erythema      Impression and Recommendations:

## 2021-11-27 DIAGNOSIS — M1712 Unilateral primary osteoarthritis, left knee: Secondary | ICD-10-CM | POA: Diagnosis not present

## 2021-12-02 ENCOUNTER — Encounter: Payer: Self-pay | Admitting: Family Medicine

## 2021-12-02 ENCOUNTER — Ambulatory Visit: Payer: Self-pay

## 2021-12-02 ENCOUNTER — Ambulatory Visit (INDEPENDENT_AMBULATORY_CARE_PROVIDER_SITE_OTHER): Payer: Medicare PPO | Admitting: Family Medicine

## 2021-12-02 ENCOUNTER — Ambulatory Visit (INDEPENDENT_AMBULATORY_CARE_PROVIDER_SITE_OTHER): Payer: Medicare PPO

## 2021-12-02 VITALS — BP 128/80 | HR 71 | Ht 61.0 in | Wt 185.0 lb

## 2021-12-02 DIAGNOSIS — M25521 Pain in right elbow: Secondary | ICD-10-CM

## 2021-12-02 DIAGNOSIS — S52124A Nondisplaced fracture of head of right radius, initial encounter for closed fracture: Secondary | ICD-10-CM | POA: Diagnosis not present

## 2021-12-02 LAB — COMPREHENSIVE METABOLIC PANEL
ALT: 9 U/L (ref 0–35)
AST: 15 U/L (ref 0–37)
Albumin: 4.3 g/dL (ref 3.5–5.2)
Alkaline Phosphatase: 87 U/L (ref 39–117)
BUN: 21 mg/dL (ref 6–23)
CO2: 23 mEq/L (ref 19–32)
Calcium: 9.9 mg/dL (ref 8.4–10.5)
Chloride: 105 mEq/L (ref 96–112)
Creatinine, Ser: 0.99 mg/dL (ref 0.40–1.20)
GFR: 53.73 mL/min — ABNORMAL LOW (ref >60.00)
Glucose, Bld: 99 mg/dL (ref 70–99)
Potassium: 3.9 mEq/L (ref 3.5–5.1)
Sodium: 138 mEq/L (ref 135–145)
Total Bilirubin: 0.6 mg/dL (ref 0.2–1.2)
Total Protein: 7.5 g/dL (ref 6.0–8.3)

## 2021-12-02 LAB — IRON: Iron: 123 ug/dL (ref 42–145)

## 2021-12-02 LAB — CBC
HCT: 39 % (ref 36.0–46.0)
Hemoglobin: 12.7 g/dL (ref 12.0–15.0)
MCHC: 32.4 g/dL (ref 30.0–36.0)
MCV: 92.8 fl (ref 78.0–100.0)
Platelets: 191 10*3/uL (ref 150.0–400.0)
RBC: 4.21 Mil/uL (ref 3.87–5.11)
RDW: 13.6 % (ref 11.5–15.5)
WBC: 5.2 10*3/uL (ref 4.0–10.5)

## 2021-12-02 LAB — FERRITIN: Ferritin: 25.4 ng/mL (ref 10.0–291.0)

## 2021-12-02 LAB — VITAMIN D 25 HYDROXY (VIT D DEFICIENCY, FRACTURES): VITD: 41.87 ng/mL (ref 30.00–100.00)

## 2021-12-02 LAB — MAGNESIUM: Magnesium: 2 mg/dL (ref 1.5–2.5)

## 2021-12-02 NOTE — Assessment & Plan Note (Signed)
Radial head and does have fracture noted. X-ray patient does have some very mild displacement noted.  Very mild depression noted of 0.6 mm.  Do not think that any other significant changes is needed but would like patient to start with physical therapy in the next couple weeks. Patient is in agreement with the plan.  Discussed icing regimen and home exercises otherwise.  Increase activity slowly.  Follow-up with me again in 6 to 8 weeks.

## 2021-12-17 DIAGNOSIS — H5213 Myopia, bilateral: Secondary | ICD-10-CM | POA: Diagnosis not present

## 2021-12-17 DIAGNOSIS — Z961 Presence of intraocular lens: Secondary | ICD-10-CM | POA: Diagnosis not present

## 2021-12-17 DIAGNOSIS — H52223 Regular astigmatism, bilateral: Secondary | ICD-10-CM | POA: Diagnosis not present

## 2021-12-19 DIAGNOSIS — N951 Menopausal and female climacteric states: Secondary | ICD-10-CM | POA: Diagnosis not present

## 2021-12-19 DIAGNOSIS — M1990 Unspecified osteoarthritis, unspecified site: Secondary | ICD-10-CM | POA: Diagnosis not present

## 2021-12-19 DIAGNOSIS — E876 Hypokalemia: Secondary | ICD-10-CM | POA: Diagnosis not present

## 2021-12-19 DIAGNOSIS — I10 Essential (primary) hypertension: Secondary | ICD-10-CM | POA: Diagnosis not present

## 2021-12-19 DIAGNOSIS — J302 Other seasonal allergic rhinitis: Secondary | ICD-10-CM | POA: Diagnosis not present

## 2021-12-19 DIAGNOSIS — R32 Unspecified urinary incontinence: Secondary | ICD-10-CM | POA: Diagnosis not present

## 2021-12-19 DIAGNOSIS — H409 Unspecified glaucoma: Secondary | ICD-10-CM | POA: Diagnosis not present

## 2021-12-19 DIAGNOSIS — K219 Gastro-esophageal reflux disease without esophagitis: Secondary | ICD-10-CM | POA: Diagnosis not present

## 2022-01-05 NOTE — Progress Notes (Unsigned)
[facility] [address] [place] [address] Phone: [phone] Subjective:   Catherine Hoffman, am serving as a scribe for Dr. Antoine Primas.  I'm seeing this patient by the request  of:  Myrlene Broker, MD  CC: right elbow pain   [facility]:[id number]  12/02/2021 Radial head and does have fracture noted. X-ray patient does have some very mild displacement noted.  Very mild depression noted of 0.6 mm.  Do not think that any other significant changes is needed but would like patient to start with physical therapy in the next couple weeks. Patient is in agreement with the plan.  Discussed icing regimen and home exercises otherwise.  Increase activity slowly.  Follow-up with me again in 6 to 8 weeks.  Update 01/06/2022 Catherine Hoffman is a 81 y.o. female coming in with complaint of R elbow pain. Patient states elbow in the same amount of pain. Not getting sleep because of pain. Nothing is helping with the pain. Not getting pain in the shoulder.       Past Medical History:  Diagnosis Date   Arthritis    Cataract    removed both eyes    Episodic tension type headache    Esophageal reflux    GERD (gastroesophageal reflux disease)    Glaucoma    Heart murmur    as young adult   Hyperlipidemia    Hypertension    Internal hemorrhoids    Kidney stone on left side Hx-date unknown   Left rotator cuff tear    Past Surgical History:  Procedure Laterality Date   COLONOSCOPY  11/20/2008   COLONOSCOPY     KNEE ARTHROSCOPY Left 2004   SHOULDER OPEN ROTATOR CUFF REPAIR Left 08/30/2012   Procedure: ROTATOR CUFF REPAIR SHOULDER OPEN, POSSIBLE GRAFT AND ANCHORS;  Surgeon: Jacki Cones, MD;  Location: [facility] [place];  Service: Orthopedics;  Laterality: Left;   TOTAL ABDOMINAL HYSTERECTOMY W/ BILATERAL SALPINGOOPHORECTOMY  1990's   TOTAL KNEE ARTHROPLASTY Right 09/05/2019   Procedure: RIGHT TOTAL KNEE ARTHROPLASTY;  Surgeon: Marcene Corning, MD;   Location: [facility];  Service: Orthopedics;  Laterality: Right;   Social History   Socioeconomic History   Marital status: Widowed    Spouse name: Not on file   Number of children: 2   Years of education: Not on file   Highest education level: Not on file  Occupational History   Occupation: Lexicographer  Tobacco Use   Smoking status: Never   Smokeless tobacco: Never  Vaping Use   Vaping Use: Never used  Substance and Sexual Activity   Alcohol use: No   Drug use: No   Sexual activity: Never  Other Topics Concern   Not on file  Social History Narrative   Not on file   Social Determinants of Health   Financial Resource Strain: Low Risk  (08/12/2017)   Overall Financial Resource Strain (CARDIA)    Difficulty of Paying Living Expenses: Not hard at all  Food Insecurity: No Food Insecurity (08/12/2017)   Hunger Vital Sign    Worried About Running Out of Food in the Last Year: Never true    Ran Out of Food in the Last Year: Never true  Transportation Needs: No Transportation Needs (08/12/2017)   PRAPARE - Administrator, Civil Service (Medical): No    Lack of Transportation (Non-Medical): No  Physical Activity: Inactive (08/12/2017)   Exercise Vital Sign    Days of Exercise  per Week: 0 days    Minutes of Exercise per Session: 0 min  Stress: No Stress Concern Present (08/12/2017)   [employer] of Occupational Health - Occupational Stress Questionnaire    Feeling of Stress : Not at all  Social Connections: Moderately Integrated (08/12/2017)   Social Connection and Isolation Panel [NHANES]    Frequency of Communication with Friends and Family: More than three times a week    Frequency of Social Gatherings with Friends and Family: More than three times a week    Attends Religious Services: More than 4 times per year    Active Member of [employer] or Organizations: Not on file    Attends Banker Meetings: More than 4 times per year    Marital Status: Widowed    Allergies  Allergen Reactions   Hydrocodone-Acetaminophen Nausea And Vomiting   Tramadol Itching   Family History  Problem Relation Age of Onset   Hypertension Mother    Diabetes Mother    Cancer Mother        in leg   Varicose Veins Maternal Uncle    Colon cancer Neg Hx    Colon polyps Neg Hx    Esophageal cancer Neg Hx    Stomach cancer Neg Hx    Rectal cancer Neg Hx     Current Outpatient Medications (Endocrine & Metabolic):    estradiol (ESTRACE) 0.5 MG tablet, Take 0.5 mg by mouth every Monday, Wednesday, and Friday.   Current Outpatient Medications (Cardiovascular):    spironolactone (ALDACTONE) 25 MG tablet, TAKE 1 TABLET BY MOUTH TWICE A DAY  Current Outpatient Medications (Respiratory):    cetirizine (ZYRTEC) 10 MG tablet, TAKE 1 TABLET BY MOUTH EVERY DAY   fluticasone (FLONASE) 50 MCG/ACT nasal spray, PLACE 2 SPRAYS INTO BOTH NOSTRILS DAILY. SHAKE LIQUID AND USE 2 SPRAYS IN EACH NOSTRIL DAILY  Current Outpatient Medications (Analgesics):    acetaminophen (TYLENOL) 500 MG tablet, Take 1,000 mg by mouth every 6 (six) hours as needed for moderate pain.    [facility] ASPIRIN ADULT LOW DOSE 81 MG chewable tablet, TAKE 1 TABLET BY MOUTH EVERY DAY   meloxicam (MOBIC) 15 MG tablet, TAKE 1 TABLET BY MOUTH EVERY DAY   Current Outpatient Medications (Other):    calcium-vitamin D (OSCAL WITH D) 500-200 MG-UNIT per tablet, Take 1 tablet by mouth daily with breakfast.   Cholecalciferol (VITAMIN D3) 50 MCG (2000 UT) TABS, Take 2,000 Units by mouth daily.    [facility] SENNA PLUS 8.6-50 MG tablet, TAKE 1 TABLET BY MOUTH TWICE A DAY   cyclobenzaprine (FLEXERIL) 5 MG tablet, Take 1 tablet (5 mg total) by mouth 3 (three) times daily as needed for muscle spasms.   diclofenac sodium (VOLTAREN) 1 % GEL, Apply 2 g topically 4 (four) times daily.   famotidine (PEPCID) 20 MG tablet, Take 1 tablet (20 mg total) by mouth daily.   gabapentin (NEURONTIN) 100 MG capsule, Take 1 capsule (100 mg total) by  mouth at bedtime.   latanoprost (XALATAN) 0.005 % ophthalmic solution, Place 1 drop into both eyes at bedtime.   omeprazole (PRILOSEC) 40 MG capsule, TAKE 1 CAPSULE BY MOUTH TWICE DAILY 30 MINUTES BEFORE FIRST AND LAST MEAL OF THE DAY   potassium chloride SA (KLOR-CON M20) 20 MEQ tablet, TAKE 1 TABLET BY MOUTH EVERY DAY   triamcinolone cream (KENALOG) 0.1 %, Apply 1 application topically 2 (two) times daily.   Reviewed prior external information including notes and imaging from  primary care provider As  well as notes that were available from care everywhere and other healthcare systems.  Past medical history, social, surgical and family history all reviewed in electronic medical record.  No pertanent information unless stated regarding to the chief complaint.   Review of Systems:  No headache, visual changes, nausea, vomiting, diarrhea, constipation, dizziness, abdominal pain, skin rash, fevers, chills, night sweats, weight loss, swollen lymph nodes, body aches, joint swelling, chest pain, shortness of breath, mood changes. POSITIVE muscle aches  Objective  Blood pressure 110/78, pulse (!) 59, height 5\' 1"  (1.549 m), weight 183 lb (83 kg), SpO2 97 %.   General: No apparent distress alert and oriented x3 mood and affect normal, dressed appropriately.  HEENT: Pupils equal, extraocular movements intact  Respiratory: Patient's speak in full sentences and does not appear short of breath  Cardiovascular: No lower extremity edema, non tender, no erythema  Right elbow does still have some mild swelling noted.  Patient does have tenderness to palpation over the radial head itself.  Patient still lacks last 10 degrees of extension.  Full flexion noted.  Patient does have pain with grip strength but is able to do so.  Limited muscular skeletal ultrasound was performed and interpreted by , M  Limited ultrasound shows a radial head still have displacement noted.  No significant calcific  changes noted at the moment. Impression: No interval improvement of the radial head displacement or fracture.    Impression and Recommendations:     The above documentation has been reviewed and is accurate and complete Antoine Primas, DO

## 2022-01-06 ENCOUNTER — Ambulatory Visit: Payer: Self-pay

## 2022-01-06 ENCOUNTER — Ambulatory Visit (INDEPENDENT_AMBULATORY_CARE_PROVIDER_SITE_OTHER)
Admission: RE | Admit: 2022-01-06 | Discharge: 2022-01-06 | Disposition: A | Discharge status: Home / Self Care | Payer: Medicare PPO | Source: Ambulatory Visit | Attending: Family Medicine | Admitting: Family Medicine

## 2022-01-06 ENCOUNTER — Ambulatory Visit: Payer: Medicare PPO | Admitting: Family Medicine

## 2022-01-06 VITALS — BP 110/78 | HR 59 | Ht 61.0 in | Wt 183.0 lb

## 2022-01-06 DIAGNOSIS — M25521 Pain in right elbow: Secondary | ICD-10-CM

## 2022-01-06 DIAGNOSIS — S52124G Nondisplaced fracture of head of right radius, subsequent encounter for closed fracture with delayed healing: Secondary | ICD-10-CM | POA: Diagnosis not present

## 2022-01-06 DIAGNOSIS — M25511 Pain in right shoulder: Secondary | ICD-10-CM | POA: Diagnosis not present

## 2022-01-06 NOTE — Patient Instructions (Addendum)
Physical Therapy Referral [address] location Xrays today at [facility] (located at basement level) Okay to double

## 2022-01-06 NOTE — Assessment & Plan Note (Signed)
Patient has been noncompliant.  Brings a another individual to the exam today.  We discussed the importance of range of motion.  Still on the ultrasound does not have a good callus formation noted yet.  We discussed that the formal physical therapy which patient did not follow-up with him but encouraged her to do so.  We will need to work on range of motion.  Think this is contributing to some of the shoulder pain as well.  Would get a repeat x-ray but unfortunately did have x-ray done today.  Hopefully we will get it at another facility and make sure that there is not any increase in 6 displacement that would need advanced imaging or surgical intervention follow-up in 2 to 3 weeks

## 2022-01-22 DIAGNOSIS — N951 Menopausal and female climacteric states: Secondary | ICD-10-CM | POA: Diagnosis not present

## 2022-01-22 DIAGNOSIS — Z01419 Encounter for gynecological examination (general) (routine) without abnormal findings: Secondary | ICD-10-CM | POA: Diagnosis not present

## 2022-01-22 DIAGNOSIS — N959 Unspecified menopausal and perimenopausal disorder: Secondary | ICD-10-CM | POA: Diagnosis not present

## 2022-01-26 ENCOUNTER — Other Ambulatory Visit: Payer: Self-pay | Admitting: Internal Medicine

## 2022-01-27 NOTE — Progress Notes (Unsigned)
[name] [address] [place] [address] Phone: [phone] Subjective:   Bruce Donath, am serving as a scribe for Dr. Antoine Primas.  I'm seeing this patient by the request  of:  Myrlene Broker, MD  CC: right elbow pain   DVV:[id number]  01/06/2022 Patient has been noncompliant.  Brings a another individual to the exam today.  We discussed the importance of range of motion.  Still on the ultrasound does not have a good callus formation noted yet.  We discussed that the formal physical therapy which patient did not follow-up with him but encouraged her to do so.  We will need to work on range of motion.  Think this is contributing to some of the shoulder pain as well.  Would get a repeat x-ray but unfortunately did have x-ray done today.  Hopefully we will get it at another facility and make sure that there is not any increase in 6 displacement that would need advanced imaging or surgical intervention follow-up in 2 to 3 weeks  Update 01/28/2022 NASTASHIA GALLO is a 81 y.o. female coming in with complaint of R elbow pain. Patient states that her pain is improving. Painful to pronate and supinate. Painful in R shoulder with use of elbow.       Past Medical History:  Diagnosis Date   Arthritis    Cataract    removed both eyes    Episodic tension type headache    Esophageal reflux    GERD (gastroesophageal reflux disease)    Glaucoma    Heart murmur    as young adult   Hyperlipidemia    Hypertension    Internal hemorrhoids    Kidney stone on left side Hx-date unknown   Left rotator cuff tear    Past Surgical History:  Procedure Laterality Date   COLONOSCOPY  11/20/2008   COLONOSCOPY     KNEE ARTHROSCOPY Left 2004   SHOULDER OPEN ROTATOR CUFF REPAIR Left 08/30/2012   Procedure: ROTATOR CUFF REPAIR SHOULDER OPEN, POSSIBLE GRAFT AND ANCHORS;  Surgeon: Jacki Cones, MD;  Location: [facility];  Service: Orthopedics;   Laterality: Left;   TOTAL ABDOMINAL HYSTERECTOMY W/ BILATERAL SALPINGOOPHORECTOMY  1990's   TOTAL KNEE ARTHROPLASTY Right 09/05/2019   Procedure: RIGHT TOTAL KNEE ARTHROPLASTY;  Surgeon: Marcene Corning, MD;  Location: [facility];  Service: Orthopedics;  Laterality: Right;   Social History   Socioeconomic History   Marital status: Widowed    Spouse name: Not on file   Number of children: 2   Years of education: Not on file   Highest education level: Not on file  Occupational History   Occupation: Lexicographer  Tobacco Use   Smoking status: Never   Smokeless tobacco: Never  Vaping Use   Vaping Use: Never used  Substance and Sexual Activity   Alcohol use: No   Drug use: No   Sexual activity: Never  Other Topics Concern   Not on file  Social History Narrative   Not on file   Social Determinants of Health   Financial Resource Strain: Low Risk  (08/12/2017)   Overall Financial Resource Strain (CARDIA)    Difficulty of Paying Living Expenses: Not hard at all  Food Insecurity: No Food Insecurity (08/12/2017)   Hunger Vital Sign    Worried About Running Out of Food in the Last Year: Never true    Ran Out of Food in the Last Year: Never true  Transportation Needs: No Transportation Needs (08/12/2017)   PRAPARE - Administrator, Civil Service (Medical): No    Lack of Transportation (Non-Medical): No  Physical Activity: Inactive (08/12/2017)   Exercise Vital Sign    Days of Exercise per Week: 0 days    Minutes of Exercise per Session: 0 min  Stress: No Stress Concern Present (08/12/2017)   [employer] of Occupational Health - Occupational Stress Questionnaire    Feeling of Stress : Not at all  Social Connections: Moderately Integrated (08/12/2017)   Social Connection and Isolation Panel [NHANES]    Frequency of Communication with Friends and Family: More than three times a week    Frequency of Social Gatherings with Friends and Family: More than three times a week     Attends Religious Services: More than 4 times per year    Active Member of [employer] or Organizations: Not on file    Attends Banker Meetings: More than 4 times per year    Marital Status: Widowed   Allergies  Allergen Reactions   Hydrocodone-Acetaminophen Nausea And Vomiting   Tramadol Itching   Family History  Problem Relation Age of Onset   Hypertension Mother    Diabetes Mother    Cancer Mother        in leg   Varicose Veins Maternal Uncle    Colon cancer Neg Hx    Colon polyps Neg Hx    Esophageal cancer Neg Hx    Stomach cancer Neg Hx    Rectal cancer Neg Hx     Current Outpatient Medications (Endocrine & Metabolic):    estradiol (ESTRACE) 0.5 MG tablet, Take 0.5 mg by mouth every Monday, Wednesday, and Friday.   Current Outpatient Medications (Cardiovascular):    spironolactone (ALDACTONE) 25 MG tablet, TAKE 1 TABLET BY MOUTH TWICE A DAY  Current Outpatient Medications (Respiratory):    cetirizine (ZYRTEC) 10 MG tablet, TAKE 1 TABLET BY MOUTH EVERY DAY   fluticasone (FLONASE) 50 MCG/ACT nasal spray, PLACE 2 SPRAYS INTO BOTH NOSTRILS DAILY. SHAKE LIQUID AND USE 2 SPRAYS IN EACH NOSTRIL DAILY  Current Outpatient Medications (Analgesics):    acetaminophen (TYLENOL) 500 MG tablet, Take 1,000 mg by mouth every 6 (six) hours as needed for moderate pain.    [employer] ASPIRIN ADULT LOW DOSE 81 MG chewable tablet, TAKE 1 TABLET BY MOUTH EVERY DAY   meloxicam (MOBIC) 15 MG tablet, TAKE 1 TABLET BY MOUTH EVERY DAY   Current Outpatient Medications (Other):    calcium-vitamin D (OSCAL WITH D) 500-200 MG-UNIT per tablet, Take 1 tablet by mouth daily with breakfast.   Cholecalciferol (VITAMIN D3) 50 MCG (2000 UT) TABS, Take 2,000 Units by mouth daily.    [employer]S SENNA PLUS 8.6-50 MG tablet, TAKE 1 TABLET BY MOUTH TWICE A DAY   cyclobenzaprine (FLEXERIL) 5 MG tablet, Take 1 tablet (5 mg total) by mouth 3 (three) times daily as needed for muscle spasms.   diclofenac sodium  (VOLTAREN) 1 % GEL, Apply 2 g topically 4 (four) times daily.   famotidine (PEPCID) 20 MG tablet, Take 1 tablet (20 mg total) by mouth daily.   gabapentin (NEURONTIN) 100 MG capsule, Take 1 capsule (100 mg total) by mouth at bedtime.   latanoprost (XALATAN) 0.005 % ophthalmic solution, Place 1 drop into both eyes at bedtime.   omeprazole (PRILOSEC) 40 MG capsule, TAKE 1 CAPSULE BY MOUTH TWICE DAILY 30 MINUTES BEFORE FIRST AND LAST MEAL OF THE DAY   potassium chloride SA (  KLOR-CON M20) 20 MEQ tablet, TAKE 1 TABLET BY MOUTH EVERY DAY   triamcinolone cream (KENALOG) 0.1 %, Apply 1 application topically 2 (two) times daily.   Reviewed prior external information including notes and imaging from  primary care provider As well as notes that were available from care everywhere and other healthcare systems.  Past medical history, social, surgical and family history all reviewed in electronic medical record.  No pertanent information unless stated regarding to the chief complaint.   Review of Systems:  No headache, visual changes, nausea, vomiting, diarrhea, constipation, dizziness, abdominal pain, skin rash, fevers, chills, night sweats, weight loss, swollen lymph nodes, joint swelling, chest pain, shortness of breath, mood changes. POSITIVE muscle aches, body aches  Objective  Blood pressure 112/78, pulse 71, height 5\' 1"  (1.549 m), weight 184 lb (83.5 kg), SpO2 97 %.   General: No apparent distress alert and oriented x3 mood and affect normal, dressed appropriately.  HEENT: Pupils equal, extraocular movements intact  Respiratory: Patient's speak in full sentences and does not appear short of breath  Cardiovascular: No lower extremity edema, non tender, no erythema  Right elbow exam shows still some limited range of motion.  Lacks last 10 degrees of extension.  Patient has minimal internal and external rotation with supination and pronation.  Lacks last 5 degrees of flexion as well.  Patient does  have an acute weakness noted of the rotator cuff.  Actively 3 out of 5 strength noted.  Patient has limited range of motion in internal and external range of motion    Impression and Recommendations:     The above documentation has been reviewed and is accurate and complete Lyndal Pulley, DO

## 2022-01-28 ENCOUNTER — Ambulatory Visit: Payer: Medicare PPO | Admitting: Family Medicine

## 2022-01-28 ENCOUNTER — Ambulatory Visit (INDEPENDENT_AMBULATORY_CARE_PROVIDER_SITE_OTHER): Payer: Medicare PPO

## 2022-01-28 ENCOUNTER — Ambulatory Visit: Payer: Self-pay

## 2022-01-28 ENCOUNTER — Encounter: Payer: Self-pay | Admitting: Family Medicine

## 2022-01-28 VITALS — BP 112/78 | HR 71 | Ht 61.0 in | Wt 184.0 lb

## 2022-01-28 DIAGNOSIS — S52124G Nondisplaced fracture of head of right radius, subsequent encounter for closed fracture with delayed healing: Secondary | ICD-10-CM | POA: Diagnosis not present

## 2022-01-28 DIAGNOSIS — M25521 Pain in right elbow: Secondary | ICD-10-CM

## 2022-01-28 DIAGNOSIS — M25511 Pain in right shoulder: Secondary | ICD-10-CM

## 2022-01-28 NOTE — Assessment & Plan Note (Signed)
Continues to be noncompliant  Has not gone to PT repeating x-rays to further evaluate if there is any significant more displacement.  Patient does have some limited range of motion of the elbow and shoulder.  Patient actually has weakness of the shoulder that is new as well.  Concern for either a early avascular necrosis or infection patient has had shoulder surgery previously on the left side and does feel that this is somewhat similar to a rotator cuff rupture she has had previously.  Depending on findings we will discuss medical management.  We will call patient with results of advanced imaging for the shoulder.

## 2022-01-28 NOTE — Assessment & Plan Note (Signed)
Acute weakness noted of the right shoulder.  With patient's recent fall and did have a fracture of the elbow and that seems to be the primary concern previously now having increasing weakness and pain of the shoulder concern for the possibility of a an acute rotator cuff injury or the possibility of avascular necrosis.  The patient's previous x-rays were unremarkable but due to the weakness and the effect on quality of life we will get advanced imaging.  Also at the moment need to know if patient can advance exercises or not.  Depending on findings we will discuss if we can do conservative therapy or if surgical intervention may be necessary.

## 2022-01-28 NOTE — Patient Instructions (Addendum)
Xray elbow today Keep working on ROM MRI R shoulder [phone] We will be in touch

## 2022-02-14 ENCOUNTER — Ambulatory Visit
Admission: RE | Admit: 2022-02-14 | Discharge: 2022-02-14 | Disposition: A | Discharge status: Home / Self Care | Payer: Medicare PPO | Source: Ambulatory Visit | Attending: Family Medicine | Admitting: Family Medicine

## 2022-02-14 DIAGNOSIS — M25511 Pain in right shoulder: Secondary | ICD-10-CM

## 2022-02-14 DIAGNOSIS — M19011 Primary osteoarthritis, right shoulder: Secondary | ICD-10-CM | POA: Diagnosis not present

## 2022-02-14 DIAGNOSIS — M75121 Complete rotator cuff tear or rupture of right shoulder, not specified as traumatic: Secondary | ICD-10-CM | POA: Diagnosis not present

## 2022-02-23 ENCOUNTER — Telehealth: Payer: Self-pay | Admitting: Family Medicine

## 2022-02-23 ENCOUNTER — Ambulatory Visit (HOSPITAL_COMMUNITY)
Admission: EM | Admit: 2022-02-23 | Discharge: 2022-02-23 | Disposition: A | Discharge status: Home / Self Care | Payer: Medicare PPO | Attending: Family Medicine | Admitting: Family Medicine

## 2022-02-23 ENCOUNTER — Other Ambulatory Visit: Payer: Self-pay

## 2022-02-23 ENCOUNTER — Encounter (HOSPITAL_COMMUNITY): Payer: Self-pay | Admitting: *Deleted

## 2022-02-23 DIAGNOSIS — U071 COVID-19: Secondary | ICD-10-CM | POA: Diagnosis not present

## 2022-02-23 MED ORDER — NIRMATRELVIR/RITONAVIR (PAXLOVID) TABLET (RENAL DOSING): ORAL_TABLET | ORAL | 0 refills | Status: DC

## 2022-02-23 NOTE — ED Provider Notes (Addendum)
[place]    CSN: [id number] Arrival date & time: 02/23/22  1843      History   Chief Complaint Chief Complaint  Patient presents with   Cough   Headache    HPI Catherine Hoffman is a 81 y.o. female.    Cough Associated symptoms: headaches   Headache Associated symptoms: cough    Here for congestion and cough and headache that began on September 15.  She has not had any fever or chills.  She has not had any vomiting or diarrhea.   No shortness of breath.  Last EGFR was 39   Past Medical History:  Diagnosis Date   Arthritis    Cataract    removed both eyes    Episodic tension type headache    Esophageal reflux    GERD (gastroesophageal reflux disease)    Glaucoma    Heart murmur    as young adult   Hyperlipidemia    Hypertension    Internal hemorrhoids    Kidney stone on left side Hx-date unknown   Left rotator cuff tear     Patient Active Problem List   Diagnosis Date Noted   Right shoulder pain 01/28/2022   Degenerative arthritis of left knee 11/18/2021   Radial head fracture 11/18/2021   Breast pain, left 08/05/2021   Headache 04/15/2021   Acute sinusitis 12/06/2020   Atypical chest pain 09/06/2020   Primary osteoarthritis of right knee 09/05/2019   DDD (degenerative disc disease), lumbar 06/28/2018   Venous insufficiency 10/21/2016   Routine general medical examination at a health care facility 02/27/2016   Asthma, mild intermittent 07/19/2015   Essential hypertension 09/28/2010   RENAL CALCULUS 08/08/2010   Hyperlipidemia 02/25/2009   Constipation 10/04/2008   Low back pain 10/04/2008   Chronic allergic rhinitis 09/21/2008   Obesity 06/30/2007   ACID REFLUX DISEASE 06/30/2007    Past Surgical History:  Procedure Laterality Date   COLONOSCOPY  11/20/2008   COLONOSCOPY     KNEE ARTHROSCOPY Left 2004   SHOULDER OPEN ROTATOR CUFF REPAIR Left 08/30/2012   Procedure: ROTATOR CUFF REPAIR SHOULDER OPEN, POSSIBLE GRAFT AND ANCHORS;   Surgeon: Tobi Bastos, MD;  Location: [facility];  Service: Orthopedics;  Laterality: Left;   TOTAL ABDOMINAL HYSTERECTOMY W/ BILATERAL SALPINGOOPHORECTOMY  1990's   TOTAL KNEE ARTHROPLASTY Right 09/05/2019   Procedure: RIGHT TOTAL KNEE ARTHROPLASTY;  Surgeon: Melrose Nakayama, MD;  Location: [facility];  Service: Orthopedics;  Laterality: Right;    OB History   No obstetric history on file.      Home Medications    Prior to Admission medications   Medication Sig Start Date End Date Taking? Authorizing Provider  nirmatrelvir/ritonavir EUA, renal dosing, (PAXLOVID) 10 x 150 MG & 10 x 100MG TABS Patient GFR is 53. Take nirmatrelvir (150 mg) one tablet twice daily for 5 days and ritonavir (100 mg) one tablet twice daily for 5 days. 02/23/22  Yes Barrett Henle, MD  acetaminophen (TYLENOL) 500 MG tablet Take 1,000 mg by mouth every 6 (six) hours as needed for moderate pain.     [provider]  calcium-vitamin D (OSCAL WITH D) 500-200 MG-UNIT per tablet Take 1 tablet by mouth daily with breakfast.    [provider]  cetirizine (ZYRTEC) 10 MG tablet TAKE 1 TABLET BY MOUTH EVERY DAY 08/26/21   Hoyt Koch, MD  Cholecalciferol (VITAMIN D3) 50 MCG (2000 UT) TABS Take 2,000 Units by mouth daily.  [provider]  [facility] ASPIRIN ADULT LOW DOSE 81 MG chewable tablet TAKE 1 TABLET BY MOUTH EVERY DAY 08/14/20   Crawford, Elizabeth A, MD  [facility] SENNA PLUS 8.6-50 MG tablet TAKE 1 TABLET BY MOUTH TWICE A DAY 01/03/21   Crawford, Elizabeth A, MD  cyclobenzaprine (FLEXERIL) 5 MG tablet Take 1 tablet (5 mg total) by mouth 3 (three) times daily as needed for muscle spasms. 09/03/20   Crawford, Elizabeth A, MD  diclofenac sodium (VOLTAREN) 1 % GEL Apply 2 g topically 4 (four) times daily. 03/17/18   Crawford, Elizabeth A, MD  estradiol (ESTRACE) 0.5 MG tablet Take 0.5 mg by mouth every Monday, Wednesday, and Friday.  05/28/17   [provider]  famotidine  (PEPCID) 20 MG tablet Take 1 tablet (20 mg total) by mouth daily. 03/20/18   Caccavale, Sophia, PA-C  fluticasone (FLONASE) 50 MCG/ACT nasal spray PLACE 2 SPRAYS INTO BOTH NOSTRILS DAILY. SHAKE LIQUID AND USE 2 SPRAYS IN EACH NOSTRIL DAILY 01/15/20   Crawford, Elizabeth A, MD  gabapentin (NEURONTIN) 100 MG capsule Take 1 capsule (100 mg total) by mouth at bedtime. 04/18/19   Smith, Zachary M, DO  latanoprost (XALATAN) 0.005 % ophthalmic solution Place 1 drop into both eyes at bedtime.    [provider]  meloxicam (MOBIC) 15 MG tablet TAKE 1 TABLET BY MOUTH EVERY DAY 02/06/22   Crawford, Elizabeth A, MD  omeprazole (PRILOSEC) 40 MG capsule TAKE 1 CAPSULE BY MOUTH TWICE DAILY 30 MINUTES BEFORE FIRST AND LAST MEAL OF THE DAY 07/08/21   Crawford, Elizabeth A, MD  potassium chloride SA (KLOR-CON M20) 20 MEQ tablet TAKE 1 TABLET BY MOUTH EVERY DAY 09/19/21   Crawford, Elizabeth A, MD  spironolactone (ALDACTONE) 25 MG tablet TAKE 1 TABLET BY MOUTH TWICE A DAY 08/26/21   Crawford, Elizabeth A, MD  triamcinolone cream (KENALOG) 0.1 % Apply 1 application topically 2 (two) times daily. 03/17/18   Crawford, Elizabeth A, MD    Family History Family History  Problem Relation Age of Onset   Hypertension Mother    Diabetes Mother    Cancer Mother        in leg   Varicose Veins Maternal Uncle    Colon cancer Neg Hx    Colon polyps Neg Hx    Esophageal cancer Neg Hx    Stomach cancer Neg Hx    Rectal cancer Neg Hx     Social History Social History   Tobacco Use   Smoking status: Never   Smokeless tobacco: Never  Vaping Use   Vaping Use: Never used  Substance Use Topics   Alcohol use: No   Drug use: No     Allergies   Hydrocodone-acetaminophen and Tramadol   Review of Systems Review of Systems  Respiratory:  Positive for cough.   Neurological:  Positive for headaches.     Physical Exam Triage Vital Signs ED Triage Vitals  Enc Vitals Group     BP 02/23/22 1952 (!) 141/67      Pulse Rate 02/23/22 1952 73     Resp 02/23/22 1952 20     Temp 02/23/22 1952 98.3 F (36.8 C)     Temp src --      SpO2 02/23/22 1952 93 %     Weight --      Height --      Head Circumference --      Peak Flow --      Pain Score 02/23/22 1950 10       Pain Loc --      Pain Edu? --      Excl. in GC? --    No data found.  Updated Vital Signs BP (!) 141/67   Pulse 73   Temp 98.3 F (36.8 C)   Resp 20   SpO2 93%   Visual Acuity Right Eye Distance:   Left Eye Distance:   Bilateral Distance:    Right Eye Near:   Left Eye Near:    Bilateral Near:     Physical Exam Vitals reviewed.  Constitutional:      General: She is not in acute distress.    Appearance: She is not toxic-appearing.  HENT:     Right Ear: Tympanic membrane and ear canal normal.     Left Ear: Tympanic membrane and ear canal normal.     Nose: Nose normal.     Mouth/Throat:     Mouth: Mucous membranes are moist.     Comments: Mild erythema of the oropharynx and there is clear mucus draining. Eyes:     Extraocular Movements: Extraocular movements intact.     Conjunctiva/sclera: Conjunctivae normal.     Pupils: Pupils are equal, round, and reactive to light.  Cardiovascular:     Rate and Rhythm: Normal rate and regular rhythm.     Heart sounds: No murmur heard. Pulmonary:     Effort: Pulmonary effort is normal. No respiratory distress.     Breath sounds: No stridor. No wheezing, rhonchi or rales.  Musculoskeletal:     Cervical back: Neck supple.  Lymphadenopathy:     Cervical: No cervical adenopathy.  Skin:    Capillary Refill: Capillary refill takes less than 2 seconds.     Coloration: Skin is not jaundiced or pale.  Neurological:     General: No focal deficit present.     Mental Status: She is alert and oriented to person, place, and time.  Psychiatric:        Behavior: Behavior normal.      UC Treatments / Results  Labs (all labs ordered are listed, but only abnormal results are  displayed) Labs Reviewed - No data to display  EKG   Radiology No results found.  Procedures Procedures (including critical care time)  Medications Ordered in UC Medications - No data to display  Initial Impression / Assessment and Plan / UC Course  I have reviewed the triage vital signs and the nursing notes.  Pertinent labs & imaging results that were available during my care of the patient were reviewed by me and considered in my medical decision making (see chart for details).        We will provide renal dosing of Paxlovid on a prescription. Final Clinical Impressions(s) / UC Diagnoses   Final diagnoses:  COVID-19     Discharge Instructions      I am glad you came in for treatment.  Paxlovid can reduce your chances of having this infection become more severe.  He will take 1 of each of the pills in the pack 2 times daily for 5 days.  Quarantine for 5 days from the start of your symptoms, and then for the next 5 days you can get out in public but with the mask on.  Continue Tylenol as needed     ED Prescriptions     Medication Sig Dispense Auth. Provider   nirmatrelvir/ritonavir EUA, renal dosing, (PAXLOVID) 10 x 150 MG & 10 x 100MG TABS Patient GFR is 53. Take nirmatrelvir (150   mg) one tablet twice daily for 5 days and ritonavir (100 mg) one tablet twice daily for 5 days. 20 tablet Derika Eckles, Gwenlyn Perking, MD      PDMP not reviewed this encounter.   Barrett Henle, MD 02/23/22 2023    Barrett Henle, MD 02/23/22 2024

## 2022-02-23 NOTE — ED Triage Notes (Signed)
Pt reports a positive COVID home test on SAT . Pt has a cough and HA.

## 2022-02-23 NOTE — Telephone Encounter (Signed)
Pt called , given shoulder MRI results. Not interested in surgical appt at this time, will call back to schedule f/u with [facility] to discuss further.

## 2022-02-23 NOTE — Discharge Instructions (Signed)
I am glad you came in for treatment.  Paxlovid can reduce your chances of having this infection become more severe.  He will take 1 of each of the pills in the pack 2 times daily for 5 days.  Quarantine for 5 days from the start of your symptoms, and then for the next 5 days you can get out in public but with the mask on.  Continue Tylenol as needed

## 2022-02-25 ENCOUNTER — Ambulatory Visit: Payer: Medicare PPO | Admitting: Family Medicine

## 2022-03-10 ENCOUNTER — Other Ambulatory Visit: Payer: Self-pay

## 2022-03-10 ENCOUNTER — Ambulatory Visit: Payer: Medicare PPO | Admitting: Internal Medicine

## 2022-03-10 ENCOUNTER — Encounter: Payer: Self-pay | Admitting: Internal Medicine

## 2022-03-10 ENCOUNTER — Telehealth: Payer: Self-pay

## 2022-03-10 VITALS — BP 128/64 | HR 69 | Temp 97.7°F | Ht 61.0 in | Wt 180.0 lb

## 2022-03-10 DIAGNOSIS — M791 Myalgia, unspecified site: Secondary | ICD-10-CM | POA: Diagnosis not present

## 2022-03-10 DIAGNOSIS — N3281 Overactive bladder: Secondary | ICD-10-CM

## 2022-03-10 DIAGNOSIS — I10 Essential (primary) hypertension: Secondary | ICD-10-CM | POA: Diagnosis not present

## 2022-03-10 DIAGNOSIS — Z23 Encounter for immunization: Secondary | ICD-10-CM

## 2022-03-10 DIAGNOSIS — M25511 Pain in right shoulder: Secondary | ICD-10-CM

## 2022-03-10 LAB — COMPREHENSIVE METABOLIC PANEL
ALT: 8 U/L (ref 0–35)
AST: 13 U/L (ref 0–37)
Albumin: 4.5 g/dL (ref 3.5–5.2)
Alkaline Phosphatase: 98 U/L (ref 39–117)
BUN: 22 mg/dL (ref 6–23)
CO2: 22 mEq/L (ref 19–32)
Calcium: 10.1 mg/dL (ref 8.4–10.5)
Chloride: 103 mEq/L (ref 96–112)
Creatinine, Ser: 1.13 mg/dL (ref 0.40–1.20)
GFR: 45.76 mL/min — ABNORMAL LOW (ref >60.00)
Glucose, Bld: 100 mg/dL — ABNORMAL HIGH (ref 70–99)
Potassium: 4.4 mEq/L (ref 3.5–5.1)
Sodium: 136 mEq/L (ref 135–145)
Total Bilirubin: 0.6 mg/dL (ref 0.2–1.2)
Total Protein: 7.9 g/dL (ref 6.0–8.3)

## 2022-03-10 LAB — MAGNESIUM: Magnesium: 2.1 mg/dL (ref 1.5–2.5)

## 2022-03-10 LAB — CK: Total CK: 131 U/L (ref 7–177)

## 2022-03-10 MED ORDER — OXYBUTYNIN CHLORIDE ER 5 MG PO TB24: 5.0000 mg | ORAL_TABLET | Freq: Every day | ORAL | 1 refills | Status: DC

## 2022-03-10 MED ORDER — SPIRONOLACTONE 50 MG PO TABS: 50.0000 mg | ORAL_TABLET | Freq: Every day | ORAL | 3 refills | Status: DC

## 2022-03-10 NOTE — Patient Instructions (Signed)
We are checking the labs today.  We have sent in oxybutynin to take at night time to help you not pee at night.  We have sent in spironolactone to take 50 mg (1 pill) in the morning instead of twice a day. You can take 2 pills in the morning of what you have at home (25 mg)

## 2022-03-10 NOTE — Progress Notes (Signed)
   Subjective:   Patient ID: Catherine Hoffman, female    DOB: 1940/12/14, 80 y.o.   MRN: [id number]  HPI The patient is an 81 YO female coming in for several concerns.   Review of Systems  Constitutional: Negative.   HENT: Negative.    Eyes: Negative.   Respiratory:  Negative for cough, chest tightness and shortness of breath.   Cardiovascular:  Negative for chest pain, palpitations and leg swelling.  Gastrointestinal:  Negative for abdominal distention, abdominal pain, constipation, diarrhea, nausea and vomiting.  Genitourinary:  Positive for enuresis and frequency. Negative for dysuria.  Musculoskeletal:  Positive for myalgias.  Skin: Negative.   Neurological: Negative.   Psychiatric/Behavioral: Negative.      Objective:  Physical Exam Constitutional:      Appearance: She is well-developed.  HENT:     Head: Normocephalic and atraumatic.  Cardiovascular:     Rate and Rhythm: Normal rate and regular rhythm.  Pulmonary:     Effort: Pulmonary effort is normal. No respiratory distress.     Breath sounds: Normal breath sounds. No wheezing or rales.  Abdominal:     General: Bowel sounds are normal. There is no distension.     Palpations: Abdomen is soft.     Tenderness: There is no abdominal tenderness. There is no rebound.  Musculoskeletal:        General: Tenderness present.     Cervical back: Normal range of motion.  Skin:    General: Skin is warm and dry.  Neurological:     Mental Status: She is alert and oriented to person, place, and time.     Coordination: Coordination normal.     Vitals:   03/10/22 1348  BP: 128/64  Pulse: 69  Temp: 97.7 F (36.5 C)  SpO2: 95%  Weight: 180 lb (81.6 kg)  Height: 5\' 1"  (1.549 m)    Assessment & Plan:  Flu shot given at visit

## 2022-03-11 DIAGNOSIS — N3281 Overactive bladder: Secondary | ICD-10-CM | POA: Insufficient documentation

## 2022-03-11 DIAGNOSIS — M791 Myalgia, unspecified site: Secondary | ICD-10-CM | POA: Insufficient documentation

## 2022-03-11 NOTE — Assessment & Plan Note (Signed)
Could be related to spironolactone and moved this to morning only to help reduce night time awakenings. Rx oxybutynin 5 mg qhs to help as well reduce awakenings to urinate at night time.

## 2022-03-11 NOTE — Assessment & Plan Note (Signed)
She is having a lot of night time awakenings. May be related to spironolactone 25 mg BID. Will change to 50 mg daily. New rx done today and instructed patient about change. BP at goal.

## 2022-03-11 NOTE — Assessment & Plan Note (Signed)
Checking CMP, CK, magnesium and adjust as needed. If normal will start magnesium otc daily to help.

## 2022-03-31 NOTE — Telephone Encounter (Signed)
Patient seen in office for brace

## 2022-04-01 DIAGNOSIS — M75121 Complete rotator cuff tear or rupture of right shoulder, not specified as traumatic: Secondary | ICD-10-CM | POA: Diagnosis not present

## 2022-04-07 DIAGNOSIS — M75101 Unspecified rotator cuff tear or rupture of right shoulder, not specified as traumatic: Secondary | ICD-10-CM | POA: Diagnosis not present

## 2022-04-10 DIAGNOSIS — M75101 Unspecified rotator cuff tear or rupture of right shoulder, not specified as traumatic: Secondary | ICD-10-CM | POA: Diagnosis not present

## 2022-04-21 DIAGNOSIS — M75101 Unspecified rotator cuff tear or rupture of right shoulder, not specified as traumatic: Secondary | ICD-10-CM | POA: Diagnosis not present

## 2022-04-22 ENCOUNTER — Ambulatory Visit (INDEPENDENT_AMBULATORY_CARE_PROVIDER_SITE_OTHER): Payer: Medicare PPO | Admitting: Family Medicine

## 2022-04-22 ENCOUNTER — Ambulatory Visit (INDEPENDENT_AMBULATORY_CARE_PROVIDER_SITE_OTHER): Payer: Medicare PPO

## 2022-04-22 VITALS — BP 150/100 | HR 73 | Ht 61.0 in | Wt 183.0 lb

## 2022-04-22 DIAGNOSIS — R0789 Other chest pain: Secondary | ICD-10-CM

## 2022-04-22 DIAGNOSIS — M2550 Pain in unspecified joint: Secondary | ICD-10-CM

## 2022-04-22 DIAGNOSIS — M545 Low back pain, unspecified: Secondary | ICD-10-CM

## 2022-04-22 DIAGNOSIS — M5136 Other intervertebral disc degeneration, lumbar region: Secondary | ICD-10-CM

## 2022-04-22 DIAGNOSIS — M5416 Radiculopathy, lumbar region: Secondary | ICD-10-CM

## 2022-04-22 DIAGNOSIS — R079 Chest pain, unspecified: Secondary | ICD-10-CM | POA: Diagnosis not present

## 2022-04-22 LAB — URINALYSIS
Bilirubin Urine: NEGATIVE
Hgb urine dipstick: NEGATIVE
Ketones, ur: NEGATIVE
Leukocytes,Ua: NEGATIVE
Nitrite: NEGATIVE
Specific Gravity, Urine: 1.01 (ref 1.000–1.030)
Total Protein, Urine: NEGATIVE
Urine Glucose: NEGATIVE
Urobilinogen, UA: 0.2 (ref 0.0–1.0)
pH: 6 (ref 5.0–8.0)

## 2022-04-22 MED ORDER — KETOROLAC TROMETHAMINE 30 MG/ML IJ SOLN
30.0000 mg | Freq: Once | INTRAMUSCULAR | Status: AC
  Administered 2022-04-22: 30 mg via INTRAMUSCULAR

## 2022-04-22 MED ORDER — METHYLPREDNISOLONE ACETATE 40 MG/ML IJ SUSP
40.0000 mg | Freq: Once | INTRAMUSCULAR | Status: AC
  Administered 2022-04-22: 40 mg via INTRAMUSCULAR

## 2022-04-22 NOTE — Progress Notes (Signed)
[facility] [address] [place] [address] Phone: [phone] Subjective:   Catherine Hoffman, am serving as a scribe for Dr. Antoine Hoffman.  I'm seeing this patient by the request  of:  Catherine Broker, MD  CC: left leg pain   IOE:[id number]  Catherine Hoffman is a 81 y.o. female coming in with complaint of L leg pain. Seen for lumbar DDD in 2021. Patient states she is having pain that starts in her left hip/low back goes straight down her leg laterally. Pain will reach to her toes. Patient will have times where she cannot walk at all. Can take tylenol or aspirin with some rest and it will take the edge off to be able to walk. Patient does get numbness and tingling in the leg and when she has these episodes her chest with feel heavy and start to cough, then will have tome pain in her chest under her breast.        Past Medical History:  Diagnosis Date   Arthritis    Cataract    removed both eyes    Episodic tension type headache    Esophageal reflux    GERD (gastroesophageal reflux disease)    Glaucoma    Heart murmur    as young adult   Hyperlipidemia    Hypertension    Internal hemorrhoids    Kidney stone on left side Hx-date unknown   Left rotator cuff tear    Past Surgical History:  Procedure Laterality Date   COLONOSCOPY  11/20/2008   COLONOSCOPY     KNEE ARTHROSCOPY Left 2004   SHOULDER OPEN ROTATOR CUFF REPAIR Left 08/30/2012   Procedure: ROTATOR CUFF REPAIR SHOULDER OPEN, POSSIBLE GRAFT AND ANCHORS;  Surgeon: Catherine Cones, MD;  Location: [facility];  Service: Orthopedics;  Laterality: Left;   TOTAL ABDOMINAL HYSTERECTOMY W/ BILATERAL SALPINGOOPHORECTOMY  1990's   TOTAL KNEE ARTHROPLASTY Right 09/05/2019   Procedure: RIGHT TOTAL KNEE ARTHROPLASTY;  Surgeon: Catherine Corning, MD;  Location: [facility];  Service: Orthopedics;  Laterality: Right;   Social History   Socioeconomic History   Marital status: Widowed     Spouse name: Not on file   Number of children: 2   Years of education: Not on file   Highest education level: Not on file  Occupational History   Occupation: Lexicographer  Tobacco Use   Smoking status: Never   Smokeless tobacco: Never  Vaping Use   Vaping Use: Never used  Substance and Sexual Activity   Alcohol use: No   Drug use: No   Sexual activity: Never  Other Topics Concern   Not on file  Social History Narrative   Not on file   Social Determinants of Health   Financial Resource Strain: Low Risk  (08/12/2017)   Overall Financial Resource Strain (CARDIA)    Difficulty of Paying Living Expenses: Not hard at all  Food Insecurity: No Food Insecurity (08/12/2017)   Hunger Vital Sign    Worried About Running Out of Food in the Last Year: Never true    Ran Out of Food in the Last Year: Never true  Transportation Needs: No Transportation Needs (08/12/2017)   PRAPARE - Administrator, Civil Service (Medical): No    Lack of Transportation (Non-Medical): No  Physical Activity: Inactive (08/12/2017)   Exercise Vital Sign    Days of Exercise per Week: 0 days    Minutes of Exercise per Session:  0 min  Stress: No Stress Concern Present (08/12/2017)   [facility] Occupational [employer] - Occupational Stress Questionnaire    Feeling of Stress : Not at all  Social Connections: Moderately Integrated (08/12/2017)   Social Connection and Isolation Panel [NHANES]    Frequency of Communication with Friends and Family: More than three times a week    Frequency of Social Gatherings with Friends and Family: More than three times a week    Attends Religious Services: More than 4 times per year    Active Member of [employer] or Organizations: Not on file    Attends Banker Meetings: More than 4 times per year    Marital Status: Widowed   Allergies  Allergen Reactions   Hydrocodone-Acetaminophen Nausea And Vomiting   Tramadol Itching   Family History  Problem  Relation Age of Onset   Hypertension Mother    Diabetes Mother    Cancer Mother        in leg   Varicose Veins Maternal Uncle    Colon cancer Neg Hx    Colon polyps Neg Hx    Esophageal cancer Neg Hx    Stomach cancer Neg Hx    Rectal cancer Neg Hx     Current Outpatient Medications (Endocrine & Metabolic):    estradiol (ESTRACE) 0.5 MG tablet, Take 0.5 mg by mouth every Monday, Wednesday, and Friday.   Current Outpatient Medications (Cardiovascular):    spironolactone (ALDACTONE) 50 MG tablet, Take 1 tablet (50 mg total) by mouth daily.  Current Outpatient Medications (Respiratory):    cetirizine (ZYRTEC) 10 MG tablet, TAKE 1 TABLET BY MOUTH EVERY DAY   fluticasone (FLONASE) 50 MCG/ACT nasal spray, PLACE 2 SPRAYS INTO BOTH NOSTRILS DAILY. SHAKE LIQUID AND USE 2 SPRAYS IN EACH NOSTRIL DAILY  Current Outpatient Medications (Analgesics):    acetaminophen (TYLENOL) 500 MG tablet, Take 1,000 mg by mouth every 6 (six) hours as needed for moderate pain.    CVS ASPIRIN ADULT LOW DOSE 81 MG chewable tablet, TAKE 1 TABLET BY MOUTH EVERY DAY   meloxicam (MOBIC) 15 MG tablet, TAKE 1 TABLET BY MOUTH EVERY DAY   Current Outpatient Medications (Other):    calcium-vitamin D (OSCAL WITH D) 500-200 MG-UNIT per tablet, Take 1 tablet by mouth daily with breakfast.   Cholecalciferol (VITAMIN D3) 50 MCG (2000 UT) TABS, Take 2,000 Units by mouth daily.    CVS SENNA PLUS 8.6-50 MG tablet, TAKE 1 TABLET BY MOUTH TWICE A DAY   cyclobenzaprine (FLEXERIL) 5 MG tablet, Take 1 tablet (5 mg total) by mouth 3 (three) times daily as needed for muscle spasms.   diclofenac sodium (VOLTAREN) 1 % GEL, Apply 2 g topically 4 (four) times daily.   famotidine (PEPCID) 20 MG tablet, Take 1 tablet (20 mg total) by mouth daily.   gabapentin (NEURONTIN) 100 MG capsule, Take 1 capsule (100 mg total) by mouth at bedtime.   latanoprost (XALATAN) 0.005 % ophthalmic solution, Place 1 drop into both eyes at bedtime.    omeprazole (PRILOSEC) 40 MG capsule, TAKE 1 CAPSULE BY MOUTH TWICE DAILY 30 MINUTES BEFORE FIRST AND LAST MEAL OF THE DAY   oxybutynin (DITROPAN-XL) 5 MG 24 hr tablet, Take 1 tablet (5 mg total) by mouth at bedtime.   potassium chloride SA (KLOR-CON M20) 20 MEQ tablet, TAKE 1 TABLET BY MOUTH EVERY DAY   triamcinolone cream (KENALOG) 0.1 %, Apply 1 application topically 2 (two) times daily.   Reviewed prior external information including notes  and imaging from  primary care provider As well as notes that were available from care everywhere and other healthcare systems.  Past medical history, social, surgical and family history all reviewed in electronic medical record.  No pertanent information unless stated regarding to the chief complaint.   Review of Systems:  No headache, visual changes, nausea, vomiting, diarrhea, constipation, dizziness, abdominal pain, skin rash, fevers, chills, night sweats, weight loss, swollen lymph nodes,  joint swelling, chest pain, shortness of breath, mood changes. POSITIVE muscle aches, body aches   Objective  Blood pressure (!) 150/100, pulse 73, height 5\' 1"  (1.549 m), weight 183 lb (83 kg), SpO2 98 %.   General: No apparent distress alert and oriented x3 mood and affect normal, dressed appropriately.  HEENT: Pupils equal, extraocular movements intact  Respiratory: Patient's speak in full sentences and does not appear short of breath  Cardiovascular: No lower extremity edema, non tender, no erythema  Antalgic gait walking with the aid of a cane.  Positive straight leg test on the left side.  Patient has 4 out of 5 strength with dorsiflexion compared to the contralateral side.  Has less than 5 degrees of extension with worsening pain.  No pain now in the midline of the back.    Impression and Recommendations:    The above documentation has been reviewed and is accurate and complete , DO

## 2022-04-22 NOTE — Assessment & Plan Note (Signed)
Known arthritic changes with worsening radicular symptoms.  Seems to be going down the left leg.  Does have degenerative arthritis of the left knee that does cause some more instability.  Discussed with patient about icing regimen and home exercises.  Failed all other conservative therapy.  Patient has responded well to an epidural in 2020 previously with patient's MRI showing L4-L5 spinal stenosis that is severe.  Discussed with patient about the possibility of another epidural which she would like to try.  Toradol and Depo-Medrol given today as well.  We discussed that if for some reason the epidural does not help we do need to consider the possibility of a repeat MRI.  Patient knows if significant worsening pain to seek medical attention immediately.  Patient is in agreement with the plan.  Follow-up again in 6 to 8 weeks otherwise.

## 2022-04-22 NOTE — Patient Instructions (Addendum)
Epidural [phone] Injections in back side Lumbar xray today See me again in 4 weeks after epidural

## 2022-04-23 DIAGNOSIS — M75101 Unspecified rotator cuff tear or rupture of right shoulder, not specified as traumatic: Secondary | ICD-10-CM | POA: Diagnosis not present

## 2022-04-29 DIAGNOSIS — M75101 Unspecified rotator cuff tear or rupture of right shoulder, not specified as traumatic: Secondary | ICD-10-CM | POA: Diagnosis not present

## 2022-05-04 ENCOUNTER — Other Ambulatory Visit: Payer: Self-pay | Admitting: Internal Medicine

## 2022-05-05 DIAGNOSIS — M75101 Unspecified rotator cuff tear or rupture of right shoulder, not specified as traumatic: Secondary | ICD-10-CM | POA: Diagnosis not present

## 2022-05-07 DIAGNOSIS — M75101 Unspecified rotator cuff tear or rupture of right shoulder, not specified as traumatic: Secondary | ICD-10-CM | POA: Diagnosis not present

## 2022-05-11 DIAGNOSIS — M75121 Complete rotator cuff tear or rupture of right shoulder, not specified as traumatic: Secondary | ICD-10-CM | POA: Diagnosis not present

## 2022-05-13 NOTE — Progress Notes (Unsigned)
[name] [address] [place] [address] Phone: [phone] Subjective:   Wynema Birch, am serving as a scribe for Dr. Antoine Primas.  I'm seeing this patient by the request  of:  Myrlene Broker, MD  CC: right sholder pain follow up   MEQ:[id number]  04/22/2022 Known arthritic changes with worsening radicular symptoms.  Seems to be going down the left leg.  Does have degenerative arthritis of the left knee that does cause some more instability.  Discussed with patient about icing regimen and home exercises.  Failed all other conservative therapy.  Patient has responded well to an epidural in 2020 previously with patient's MRI showing L4-L5 spinal stenosis that is severe.  Discussed with patient about the possibility of another epidural which she would like to try.  Toradol and Depo-Medrol given today as well.  We discussed that if for some reason the epidural does not help we do need to consider the possibility of a repeat MRI.  Patient knows if significant worsening pain to seek medical attention immediately.  Patient is in agreement with the plan.  Follow-up again in 6 to 8 weeks otherwise.     Update 05/19/2022 SHONNA DEITER is a 81 y.o. female coming in with complaint of R shoulder pain. Patient states that the pain is up and down. Patient is in PT and after PT that's when it starts to hurt. Patient has one more day of therapy, and patient is scheduled for the surgery. Patient is having the left hip pain that radiates down to her toes. That is what is causing her the most distress. Patient states the epidural only seemed to help for about a day.       Past Medical History:  Diagnosis Date   Arthritis    Cataract    removed both eyes    Episodic tension type headache    Esophageal reflux    GERD (gastroesophageal reflux disease)    Glaucoma    Heart murmur    as young adult   Hyperlipidemia    Hypertension    Internal  hemorrhoids    Kidney stone on left side Hx-date unknown   Left rotator cuff tear    Past Surgical History:  Procedure Laterality Date   COLONOSCOPY  11/20/2008   COLONOSCOPY     KNEE ARTHROSCOPY Left 2004   SHOULDER OPEN ROTATOR CUFF REPAIR Left 08/30/2012   Procedure: ROTATOR CUFF REPAIR SHOULDER OPEN, POSSIBLE GRAFT AND ANCHORS;  Surgeon: Jacki Cones, MD;  Location: [facility];  Service: Orthopedics;  Laterality: Left;   TOTAL ABDOMINAL HYSTERECTOMY W/ BILATERAL SALPINGOOPHORECTOMY  1990's   TOTAL KNEE ARTHROPLASTY Right 09/05/2019   Procedure: RIGHT TOTAL KNEE ARTHROPLASTY;  Surgeon: Marcene Corning, MD;  Location: [facility];  Service: Orthopedics;  Laterality: Right;   Social History   Socioeconomic History   Marital status: Widowed    Spouse name: Not on file   Number of children: 2   Years of education: Not on file   Highest education level: Not on file  Occupational History   Occupation: Lexicographer  Tobacco Use   Smoking status: Never   Smokeless tobacco: Never  Vaping Use   Vaping Use: Never used  Substance and Sexual Activity   Alcohol use: No   Drug use: No   Sexual activity: Never  Other Topics Concern   Not on file  Social History Narrative   Not on file   Social  Determinants of Health   Financial Resource Strain: Low Risk  (08/12/2017)   Overall Financial Resource Strain (CARDIA)    Difficulty of Paying Living Expenses: Not hard at all  Food Insecurity: No Food Insecurity (08/12/2017)   Hunger Vital Sign    Worried About Running Out of Food in the Last Year: Never true    Ran Out of Food in the Last Year: Never true  Transportation Needs: No Transportation Needs (08/12/2017)   PRAPARE - Hydrologist (Medical): No    Lack of Transportation (Non-Medical): No  Physical Activity: Inactive (08/12/2017)   Exercise Vital Sign    Days of Exercise per Week: 0 days    Minutes of Exercise per Session: 0 min  Stress: No  Stress Concern Present (08/12/2017)   [place]    Feeling of Stress : Not at all  Social Connections: Moderately Integrated (08/12/2017)   Social Connection and Isolation Panel [NHANES]    Frequency of Communication with Friends and Family: More than three times a week    Frequency of Social Gatherings with Friends and Family: More than three times a week    Attends Religious Services: More than 4 times per year    Active Member of [employer] or Organizations: Not on file    Attends Archivist Meetings: More than 4 times per year    Marital Status: Widowed   Allergies  Allergen Reactions   Hydrocodone-Acetaminophen Nausea And Vomiting   Tramadol Itching   Family History  Problem Relation Age of Onset   Hypertension Mother    Diabetes Mother    Cancer Mother        in leg   Varicose Veins Maternal Uncle    Colon cancer Neg Hx    Colon polyps Neg Hx    Esophageal cancer Neg Hx    Stomach cancer Neg Hx    Rectal cancer Neg Hx     Current Outpatient Medications (Endocrine & Metabolic):    estradiol (ESTRACE) 0.5 MG tablet, Take 0.5 mg by mouth every Monday, Wednesday, and Friday.   Current Outpatient Medications (Cardiovascular):    spironolactone (ALDACTONE) 50 MG tablet, Take 1 tablet (50 mg total) by mouth daily.  Current Outpatient Medications (Respiratory):    cetirizine (ZYRTEC) 10 MG tablet, TAKE 1 TABLET BY MOUTH EVERY DAY   fluticasone (FLONASE) 50 MCG/ACT nasal spray, PLACE 2 SPRAYS INTO BOTH NOSTRILS DAILY. SHAKE LIQUID AND USE 2 SPRAYS IN EACH NOSTRIL DAILY  Current Outpatient Medications (Analgesics):    acetaminophen (TYLENOL) 500 MG tablet, Take 1,000 mg by mouth every 6 (six) hours as needed for moderate pain.    [facility] ASPIRIN ADULT LOW DOSE 81 MG chewable tablet, TAKE 1 TABLET BY MOUTH EVERY DAY   meloxicam (MOBIC) 15 MG tablet, TAKE 1 TABLET BY MOUTH EVERY DAY   Current Outpatient  Medications (Other):    gabapentin (NEURONTIN) 100 MG capsule, Take 2 capsules (200 mg total) by mouth at bedtime.   calcium-vitamin D (OSCAL WITH D) 500-200 MG-UNIT per tablet, Take 1 tablet by mouth daily with breakfast.   Cholecalciferol (VITAMIN D3) 50 MCG (2000 UT) TABS, Take 2,000 Units by mouth daily.    [facility] SENNA PLUS 8.6-50 MG tablet, TAKE 1 TABLET BY MOUTH TWICE A DAY   cyclobenzaprine (FLEXERIL) 5 MG tablet, Take 1 tablet (5 mg total) by mouth 3 (three) times daily as needed for muscle spasms.   diclofenac sodium (  VOLTAREN) 1 % GEL, Apply 2 g topically 4 (four) times daily.   famotidine (PEPCID) 20 MG tablet, Take 1 tablet (20 mg total) by mouth daily.   latanoprost (XALATAN) 0.005 % ophthalmic solution, Place 1 drop into both eyes at bedtime.   omeprazole (PRILOSEC) 40 MG capsule, TAKE 1 CAPSULE BY MOUTH TWICE DAILY 30 MINUTES BEFORE FIRST AND LAST MEAL OF THE DAY   oxybutynin (DITROPAN-XL) 5 MG 24 hr tablet, Take 1 tablet (5 mg total) by mouth at bedtime.   potassium chloride SA (KLOR-CON M20) 20 MEQ tablet, TAKE 1 TABLET BY MOUTH EVERY DAY   triamcinolone cream (KENALOG) 0.1 %, Apply 1 application topically 2 (two) times daily.   Reviewed prior external information including notes and imaging from  primary care provider As well as notes that were available from care everywhere and other healthcare systems.  Past medical history, social, surgical and family history all reviewed in electronic medical record.  No pertanent information unless stated regarding to the chief complaint.   Review of Systems:  No headache, visual changes, nausea, vomiting, diarrhea, constipation, dizziness, abdominal pain, skin rash, fevers, chills, night sweats, weight loss, swollen lymph nodes, body aches, joint swelling, chest pain, shortness of breath, mood changes. POSITIVE muscle aches  Objective  Blood pressure 118/68, pulse 80, height 5\' 1"  (1.549 m), weight 180 lb (81.6 kg), SpO2 98 %.    General: No apparent distress alert and oriented x3 mood and affect normal, dressed appropriately.  HEENT: Pupils equal, extraocular movements intact  Respiratory: Patient's speak in full sentences and does not appear short of breath  Cardiovascular: No lower extremity edema, non tender, no erythema  Right shoulder pain shows some tenderness noted still.  Still some limited range of motion.  Regarding patient's left hip more tenderness to palpation over the left greater trochanteric area.  Patient does have tenderness though noted diffusely down the leg.  Patient does have tightness with straight leg test but no radicular symptoms.  Unable to do Corky Sox secondary to voluntary guarding.   After verbal consent patient was prepped with alcohol swab and with a 21-gauge 2 inch needle injected into the left greater trochanteric area with 2 cc of 0.5% Marcaine and 1 cc of Kenalog 40 mg/mL.  No blood loss.  Band-Aid placed.  Postinjection instructions given    Impression and Recommendations:    The above documentation has been reviewed and is accurate and complete Lyndal Pulley, DO

## 2022-05-19 ENCOUNTER — Ambulatory Visit: Payer: Medicare PPO | Admitting: Family Medicine

## 2022-05-19 VITALS — BP 118/68 | HR 80 | Ht 61.0 in | Wt 180.0 lb

## 2022-05-19 DIAGNOSIS — M7062 Trochanteric bursitis, left hip: Secondary | ICD-10-CM | POA: Diagnosis not present

## 2022-05-19 MED ORDER — GABAPENTIN 100 MG PO CAPS: 200.0000 mg | ORAL_CAPSULE | Freq: Every day | ORAL | 0 refills | Status: DC

## 2022-05-19 NOTE — Patient Instructions (Addendum)
Gabapentin 200mg  at night Injected L GT today See me again in 2 months

## 2022-05-20 DIAGNOSIS — M7062 Trochanteric bursitis, left hip: Secondary | ICD-10-CM | POA: Insufficient documentation

## 2022-05-20 NOTE — Assessment & Plan Note (Signed)
I believe the most likely patient has been compensating for the degenerative changes in the left knee as well as potentially the lumbar radiculopathy.  We discussed with patient about different treatment options.  Patient elected to try the injection with this being the most primary diagnosis.  Patient given some exercises to work on hip abductors.  Discussed the potential for formal physical therapy but patient declined at the moment.  Social determinants of health include difficulty with transportation.  Discussed icing regimen and home exercises otherwise.  Follow-up again in 6 to 8 weeks

## 2022-05-21 ENCOUNTER — Ambulatory Visit (INDEPENDENT_AMBULATORY_CARE_PROVIDER_SITE_OTHER): Payer: Medicare PPO

## 2022-05-21 VITALS — Ht 61.0 in | Wt 180.0 lb

## 2022-05-21 DIAGNOSIS — Z0000 Encounter for general adult medical examination without abnormal findings: Secondary | ICD-10-CM | POA: Diagnosis not present

## 2022-05-21 DIAGNOSIS — M75101 Unspecified rotator cuff tear or rupture of right shoulder, not specified as traumatic: Secondary | ICD-10-CM | POA: Diagnosis not present

## 2022-05-21 NOTE — Patient Instructions (Signed)
Catherine Hoffman , Thank you for taking time to come for your Medicare Wellness Visit. I appreciate your ongoing commitment to your health goals. Please review the following plan we discussed and let me know if I can assist you in the future.   These are the goals we discussed:  Goals      Client understands the importance of follow-up with providers by attending scheduled visits.        This is a list of the screening recommended for you and due dates:  Health Maintenance  Topic Date Due   Zoster (Shingles) Vaccine (1 of 2) Never done   DEXA scan (bone density measurement)  Never done   COVID-19 Vaccine (6 - 2023-24 season) 02/06/2022   Medicare Annual Wellness Visit  05/22/2023   DTaP/Tdap/Td vaccine (3 - Td or Tdap) 05/25/2026   Pneumonia Vaccine  Completed   Flu Shot  Completed   HPV Vaccine  Aged Out    Advanced directives: No; Advance directive discussed with you today. I have provided a copy for you to complete at home and have notarized. Once this is complete please bring a copy in to our office so we can scan it into your chart.  Conditions/risks identified: Yes  Next appointment: Follow up in one year for your annual wellness visit.   Preventive Care 11 Years and Older, Female Preventive care refers to lifestyle choices and visits with your health care provider that can promote health and wellness. What does preventive care include? A yearly physical exam. This is also called an annual well check. Dental exams once or twice a year. Routine eye exams. Ask your health care provider how often you should have your eyes checked. Personal lifestyle choices, including: Daily care of your teeth and gums. Regular physical activity. Eating a healthy diet. Avoiding tobacco and drug use. Limiting alcohol use. Practicing safe sex. Taking low-dose aspirin every day. Taking vitamin and mineral supplements as recommended by your health care provider. What happens during an annual  well check? The services and screenings done by your health care provider during your annual well check will depend on your age, overall health, lifestyle risk factors, and family history of disease. Counseling  Your health care provider may ask you questions about your: Alcohol use. Tobacco use. Drug use. Emotional well-being. Home and relationship well-being. Sexual activity. Eating habits. History of falls. Memory and ability to understand (cognition). Work and work Astronomer. Reproductive health. Screening  You may have the following tests or measurements: Height, weight, and BMI. Blood pressure. Lipid and cholesterol levels. These may be checked every 5 years, or more frequently if you are over 63 years old. Skin check. Lung cancer screening. You may have this screening every year starting at age 73 if you have a 30-pack-year history of smoking and currently smoke or have quit within the past 15 years. Fecal occult blood test (FOBT) of the stool. You may have this test every year starting at age 62. Flexible sigmoidoscopy or colonoscopy. You may have a sigmoidoscopy every 5 years or a colonoscopy every 10 years starting at age 36. Hepatitis C blood test. Hepatitis B blood test. Sexually transmitted disease (STD) testing. Diabetes screening. This is done by checking your blood sugar (glucose) after you have not eaten for a while (fasting). You may have this done every 1-3 years. Bone density scan. This is done to screen for osteoporosis. You may have this done starting at age 32. Mammogram. This may be done every  1-2 years. Talk to your health care provider about how often you should have regular mammograms. Talk with your health care provider about your test results, treatment options, and if necessary, the need for more tests. Vaccines  Your health care provider may recommend certain vaccines, such as: Influenza vaccine. This is recommended every year. Tetanus, diphtheria,  and acellular pertussis (Tdap, Td) vaccine. You may need a Td booster every 10 years. Zoster vaccine. You may need this after age 37. Pneumococcal 13-valent conjugate (PCV13) vaccine. One dose is recommended after age 54. Pneumococcal polysaccharide (PPSV23) vaccine. One dose is recommended after age 17. Talk to your health care provider about which screenings and vaccines you need and how often you need them. This information is not intended to replace advice given to you by your health care provider. Make sure you discuss any questions you have with your health care provider. Document Released: 06/21/2015 Document Revised: 02/12/2016 Document Reviewed: 03/26/2015 Elsevier Interactive Patient Education  2017 [place] Prevention in the Home Falls can cause injuries. They can happen to people of all ages. There are many things you can do to make your home safe and to help prevent falls. What can I do on the outside of my home? Regularly fix the edges of walkways and driveways and fix any cracks. Remove anything that might make you trip as you walk through a door, such as a raised step or threshold. Trim any bushes or trees on the path to your home. Use bright outdoor lighting. Clear any walking paths of anything that might make someone trip, such as rocks or tools. Regularly check to see if handrails are loose or broken. Make sure that both sides of any steps have handrails. Any raised decks and porches should have guardrails on the edges. Have any leaves, snow, or ice cleared regularly. Use sand or salt on walking paths during winter. Clean up any spills in your garage right away. This includes oil or grease spills. What can I do in the bathroom? Use night lights. Install grab bars by the toilet and in the tub and shower. Do not use towel bars as grab bars. Use non-skid mats or decals in the tub or shower. If you need to sit down in the shower, use a plastic, non-slip  stool. Keep the floor dry. Clean up any water that spills on the floor as soon as it happens. Remove soap buildup in the tub or shower regularly. Attach bath mats securely with double-sided non-slip rug tape. Do not have throw rugs and other things on the floor that can make you trip. What can I do in the bedroom? Use night lights. Make sure that you have a light by your bed that is easy to reach. Do not use any sheets or blankets that are too big for your bed. They should not hang down onto the floor. Have a firm chair that has side arms. You can use this for support while you get dressed. Do not have throw rugs and other things on the floor that can make you trip. What can I do in the kitchen? Clean up any spills right away. Avoid walking on wet floors. Keep items that you use a lot in easy-to-reach places. If you need to reach something above you, use a strong step stool that has a grab bar. Keep electrical cords out of the way. Do not use floor polish or wax that makes floors slippery. If you must use wax, use non-skid  floor wax. Do not have throw rugs and other things on the floor that can make you trip. What can I do with my stairs? Do not leave any items on the stairs. Make sure that there are handrails on both sides of the stairs and use them. Fix handrails that are broken or loose. Make sure that handrails are as long as the stairways. Check any carpeting to make sure that it is firmly attached to the stairs. Fix any carpet that is loose or worn. Avoid having throw rugs at the top or bottom of the stairs. If you do have throw rugs, attach them to the floor with carpet tape. Make sure that you have a light switch at the top of the stairs and the bottom of the stairs. If you do not have them, ask someone to add them for you. What else can I do to help prevent falls? Wear shoes that: Do not have high heels. Have rubber bottoms. Are comfortable and fit you well. Are closed at the  toe. Do not wear sandals. If you use a stepladder: Make sure that it is fully opened. Do not climb a closed stepladder. Make sure that both sides of the stepladder are locked into place. Ask someone to hold it for you, if possible. Clearly mark and make sure that you can see: Any grab bars or handrails. First and last steps. Where the edge of each step is. Use tools that help you move around (mobility aids) if they are needed. These include: Canes. Walkers. Scooters. Crutches. Turn on the lights when you go into a dark area. Replace any light bulbs as soon as they burn out. Set up your furniture so you have a clear path. Avoid moving your furniture around. If any of your floors are uneven, fix them. If there are any pets around you, be aware of where they are. Review your medicines with your doctor. Some medicines can make you feel dizzy. This can increase your chance of falling. Ask your doctor what other things that you can do to help prevent falls. This information is not intended to replace advice given to you by your health care provider. Make sure you discuss any questions you have with your health care provider. Document Released: 03/21/2009 Document Revised: 10/31/2015 Document Reviewed: 06/29/2014 Elsevier Interactive Patient Education  2017 [employer].

## 2022-05-21 NOTE — Progress Notes (Signed)
Virtual Visit via Telephone Note  I connected with  Catherine Hoffman on 05/21/22 at  9:15 AM EST by telephone and verified that I am speaking with the correct person using two identifiers.  Location: Patient: HOME Provider: [facility] Persons participating in the virtual visit: patient/Nurse Health Advisor   I discussed the limitations, risks, security and privacy concerns of performing an evaluation and management service by telephone and the availability of in person appointments. The patient expressed understanding and agreed to proceed.  Interactive audio and video telecommunications were attempted between this nurse and patient, however failed, due to patient having technical difficulties OR patient did not have access to video capability.  We continued and completed visit with audio only.  Some vital signs may be absent or patient reported.   Mickeal Needy, LPN  Subjective:   Catherine Hoffman is a 81 y.o. female who presents for Medicare Annual (Subsequent) preventive examination.  Review of Systems     Cardiac Risk Factors include: advanced age (>12men, >9 women);dyslipidemia;hypertension;obesity (BMI >30kg/m2)     Objective:    Today's Vitals   05/21/22 0935  Weight: 180 lb (81.6 kg)  Height: 5\' 1"  (1.549 m)  PainSc: 0-No pain   Body mass index is 34.01 kg/m.     05/21/2022    3:58 PM 09/05/2019    2:00 PM 08/29/2019   11:12 AM 08/12/2017   10:26 AM 03/07/2016    4:03 PM 07/14/2015    6:37 AM 08/30/2012    6:50 AM  Advanced Directives  Does Patient Have a Medical Advance Directive? No No No No No No Patient does not have advance directive;Patient would like information  Would patient like information on creating a medical advance directive? No - Patient declined No - Patient declined No - Patient declined Yes (ED - Information included in AVS)   Advance directive packet given  Pre-existing out of facility DNR order (yellow form or pink MOST form)       No     Current Medications (verified) Outpatient Encounter Medications as of 05/21/2022  Medication Sig   acetaminophen (TYLENOL) 500 MG tablet Take 1,000 mg by mouth every 6 (six) hours as needed for moderate pain.    calcium-vitamin D (OSCAL WITH D) 500-200 MG-UNIT per tablet Take 1 tablet by mouth daily with breakfast.   cetirizine (ZYRTEC) 10 MG tablet TAKE 1 TABLET BY MOUTH EVERY DAY   Cholecalciferol (VITAMIN D3) 50 MCG (2000 UT) TABS Take 2,000 Units by mouth daily.    [employer] ASPIRIN ADULT LOW DOSE 81 MG chewable tablet TAKE 1 TABLET BY MOUTH EVERY DAY   [employer] SENNA PLUS 8.6-50 MG tablet TAKE 1 TABLET BY MOUTH TWICE A DAY   cyclobenzaprine (FLEXERIL) 5 MG tablet Take 1 tablet (5 mg total) by mouth 3 (three) times daily as needed for muscle spasms.   diclofenac sodium (VOLTAREN) 1 % GEL Apply 2 g topically 4 (four) times daily.   estradiol (ESTRACE) 0.5 MG tablet Take 0.5 mg by mouth every Monday, Wednesday, and Friday.    famotidine (PEPCID) 20 MG tablet Take 1 tablet (20 mg total) by mouth daily.   fluticasone (FLONASE) 50 MCG/ACT nasal spray PLACE 2 SPRAYS INTO BOTH NOSTRILS DAILY. SHAKE LIQUID AND USE 2 SPRAYS IN EACH NOSTRIL DAILY   gabapentin (NEURONTIN) 100 MG capsule Take 2 capsules (200 mg total) by mouth at bedtime.   latanoprost (XALATAN) 0.005 % ophthalmic solution Place 1 drop into both eyes at bedtime.  meloxicam (MOBIC) 15 MG tablet TAKE 1 TABLET BY MOUTH EVERY DAY   omeprazole (PRILOSEC) 40 MG capsule TAKE 1 CAPSULE BY MOUTH TWICE DAILY 30 MINUTES BEFORE FIRST AND LAST MEAL OF THE DAY   oxybutynin (DITROPAN-XL) 5 MG 24 hr tablet Take 1 tablet (5 mg total) by mouth at bedtime.   potassium chloride SA (KLOR-CON M20) 20 MEQ tablet TAKE 1 TABLET BY MOUTH EVERY DAY   spironolactone (ALDACTONE) 50 MG tablet Take 1 tablet (50 mg total) by mouth daily.   triamcinolone cream (KENALOG) 0.1 % Apply 1 application topically 2 (two) times daily.   No facility-administered encounter  medications on file as of 05/21/2022.    Allergies (verified) Hydrocodone-acetaminophen and Tramadol   History: Past Medical History:  Diagnosis Date   Arthritis    Cataract    removed both eyes    Episodic tension type headache    Esophageal reflux    GERD (gastroesophageal reflux disease)    Glaucoma    Heart murmur    as young adult   Hyperlipidemia    Hypertension    Internal hemorrhoids    Kidney stone on left side Hx-date unknown   Left rotator cuff tear    Past Surgical History:  Procedure Laterality Date   COLONOSCOPY  11/20/2008   COLONOSCOPY     KNEE ARTHROSCOPY Left 2004   SHOULDER OPEN ROTATOR CUFF REPAIR Left 08/30/2012   Procedure: ROTATOR CUFF REPAIR SHOULDER OPEN, POSSIBLE GRAFT AND ANCHORS;  Surgeon: Jacki Cones, MD;  Location: [facility] [place];  Service: Orthopedics;  Laterality: Left;   TOTAL ABDOMINAL HYSTERECTOMY W/ BILATERAL SALPINGOOPHORECTOMY  1990's   TOTAL KNEE ARTHROPLASTY Right 09/05/2019   Procedure: RIGHT TOTAL KNEE ARTHROPLASTY;  Surgeon: Marcene Corning, MD;  Location: [facility];  Service: Orthopedics;  Laterality: Right;   Family History  Problem Relation Age of Onset   Hypertension Mother    Diabetes Mother    Cancer Mother        in leg   Varicose Veins Maternal Uncle    Colon cancer Neg Hx    Colon polyps Neg Hx    Esophageal cancer Neg Hx    Stomach cancer Neg Hx    Rectal cancer Neg Hx    Social History   Socioeconomic History   Marital status: Widowed    Spouse name: Not on file   Number of children: 2   Years of education: Not on file   Highest education level: Not on file  Occupational History   Occupation: Lexicographer  Tobacco Use   Smoking status: Never   Smokeless tobacco: Never  Vaping Use   Vaping Use: Never used  Substance and Sexual Activity   Alcohol use: No   Drug use: No   Sexual activity: Never  Other Topics Concern   Not on file  Social History Narrative   Not on file   Social  Determinants of Health   Financial Resource Strain: Low Risk  (05/21/2022)   Overall Financial Resource Strain (CARDIA)    Difficulty of Paying Living Expenses: Not hard at all  Food Insecurity: No Food Insecurity (05/21/2022)   Hunger Vital Sign    Worried About Running Out of Food in the Last Year: Never true    Ran Out of Food in the Last Year: Never true  Transportation Needs: No Transportation Needs (05/21/2022)   PRAPARE - Administrator, Civil Service (Medical): No    Lack of Transportation (Non-Medical): No  Physical Activity: Sufficiently Active (05/21/2022)   Exercise Vital Sign    Days of Exercise per Week: 5 days    Minutes of Exercise per Session: 30 min  Stress: No Stress Concern Present (05/21/2022)   [place]    Feeling of Stress : Not at all  Social Connections: Moderately Integrated (05/21/2022)   Social Connection and Isolation Panel [NHANES]    Frequency of Communication with Friends and Family: More than three times a week    Frequency of Social Gatherings with Friends and Family: More than three times a week    Attends Religious Services: More than 4 times per year    Active Member of [employer] or Organizations: Yes    Attends Archivist Meetings: More than 4 times per year    Marital Status: Widowed    Tobacco Counseling Counseling given: Not Answered   Clinical Intake:  Pre-visit preparation completed: Yes  Pain : No/denies pain Pain Score: 0-No pain     BMI - recorded: 34.01 Nutritional Status: BMI > 30  Obese Nutritional Risks: None Diabetes: No  How often do you need to have someone help you when you read instructions, pamphlets, or other written materials from your doctor or pharmacy?: 1 - Never What is the last grade level you completed in school?: HSG  Diabetic? NO  Interpreter Needed?: No  Information entered by :: Lisette Abu,  LPN.   Activities of Daily Living    05/21/2022    4:00 PM  In your present state of health, do you have any difficulty performing the following activities:  Hearing? 0  Vision? 0  Difficulty concentrating or making decisions? 0  Walking or climbing stairs? 0  Dressing or bathing? 0  Doing errands, shopping? 0  Preparing Food and eating ? N  Using the Toilet? N  In the past six months, have you accidently leaked urine? N  Do you have problems with loss of bowel control? N  Managing your Medications? N  Managing your Finances? N  Housekeeping or managing your Housekeeping? N    Patient Care Team: Hoyt Koch, MD as PCP - General (Internal Medicine)  Indicate any recent Medical Services you may have received from other than [facility] providers in the past year (date may be approximate).     Assessment:   This is a routine wellness examination for Catherine Hoffman.  Hearing/Vision screen Hearing Screening - Comments:: Denies hearing difficulties   Vision Screening - Comments:: Wears rx glasses - up to date with routine eye exams [facility]   Dietary issues and exercise activities discussed: Current Exercise Habits: Home exercise routine, Type of exercise: walking;Other - see comments (PT Exercises), Time (Minutes): 30, Frequency (Times/Week): 5, Weekly Exercise (Minutes/Week): 150, Intensity: Moderate, Exercise limited by: orthopedic condition(s)   Goals Addressed             This Visit's Progress    Client understands the importance of follow-up with providers by attending scheduled visits.        Depression Screen    05/21/2022    4:00 PM 08/05/2021   10:00 AM 08/01/2020    9:41 AM 03/09/2019    2:35 PM 08/12/2017   10:27 AM 03/09/2017    9:31 AM 06/06/2015    8:04 AM  PHQ 2/9 Scores  PHQ - 2 Score 0 0 0 0 0 0 0  PHQ- 9 Score     2  Fall Risk    05/21/2022    3:59 PM 08/05/2021   10:00 AM 08/01/2020    9:41 AM 03/09/2019    2:35 PM 08/12/2017   10:27  AM  Fall Risk   Falls in the past year? 0 0 0 0 No  Number falls in past yr: 0 0 0    Injury with Fall? 0 0 0    Risk for fall due to : No Fall Risks      Follow up Falls prevention discussed        FALL RISK PREVENTION PERTAINING TO THE HOME:  Any stairs in or around the home? No  If so, are there any without handrails? No  Home free of loose throw rugs in walkways, pet beds, electrical cords, etc? Yes  Adequate lighting in your home to reduce risk of falls? Yes   ASSISTIVE DEVICES UTILIZED TO PREVENT FALLS:  Life alert? No  Use of a cane, walker or w/c? No  Grab bars in the bathroom? No  Shower chair or bench in shower? No  Elevated toilet seat or a handicapped toilet? No   TIMED UP AND GO:  Was the test performed? No . PHONE VISIT   Cognitive Function:    08/12/2017   10:34 AM  MMSE - Mini Mental State Exam  Orientation to time 5  Orientation to Place 5  Registration 3  Attention/ Calculation 4  Recall 2  Language- name 2 objects 2  Language- repeat 1  Language- follow 3 step command 3  Language- read & follow direction 1  Write a sentence 1  Copy design 1  Total score 28        05/21/2022    4:00 PM  6CIT Screen  What Year? 0 points  What month? 0 points  What time? 0 points  Count back from 20 0 points  Months in reverse 0 points  Repeat phrase 0 points  Total Score 0 points    Immunizations Immunization History  Administered Date(s) Administered   Fluad Quad(high Dose 65+) 03/09/2019, 08/01/2020, 03/08/2021, 03/10/2022   Influenza Split 05/30/2012   Influenza Whole 07/08/2009, 02/26/2010, 03/09/2011   Influenza, High Dose Seasonal PF 03/09/2017, 03/17/2018   Influenza,inj,Quad PF,6+ Mos 02/07/2013, 08/17/2014, 02/27/2015, 01/30/2016   PFIZER Comirnaty(Gray Top)Covid-19 Tri-Sucrose Vaccine 11/26/2020   PFIZER(Purple Top)SARS-COV-2 Vaccination 07/13/2019, 08/03/2019, 03/28/2020   Pfizer Covid-19 Vaccine Bivalent Booster 63yrs & up 03/26/2021    Pneumococcal Conjugate-13 08/17/2014   Pneumococcal Polysaccharide-23 07/26/2015   Td 02/25/2009   Tdap 05/25/2016    TDAP status: Up to date  Flu Vaccine status: Up to date  Pneumococcal vaccine status: Up to date  Covid-19 vaccine status: Completed vaccines  Qualifies for Shingles Vaccine? Yes   Zostavax completed No   Shingrix Completed?: No.    Education has been provided regarding the importance of this vaccine. Patient has been advised to call insurance company to determine out of pocket expense if they have not yet received this vaccine. Advised may also receive vaccine at local pharmacy or Health Dept. Verbalized acceptance and understanding.  Screening Tests Health Maintenance  Topic Date Due   Zoster Vaccines- Shingrix (1 of 2) Never done   DEXA SCAN  Never done   COVID-19 Vaccine (6 - 2023-24 season) 02/06/2022   Medicare Annual Wellness (AWV)  05/22/2023   DTaP/Tdap/Td (3 - Td or Tdap) 05/25/2026   Pneumonia Vaccine 11+ Years old  Completed   INFLUENZA VACCINE  Completed   HPV VACCINES  Aged Out    Health Maintenance  Health Maintenance Due  Topic Date Due   Zoster Vaccines- Shingrix (1 of 2) Never done   DEXA SCAN  Never done   COVID-19 Vaccine (6 - 2023-24 season) 02/06/2022    Colorectal cancer screening: No longer required.   Mammogram status: Completed 08/07/2021. Repeat every year  Bone Density status: Never done  Lung Cancer Screening: (Low Dose CT Chest recommended if Age 59-80 years, 30 pack-year currently smoking OR have quit w/in 15years.) does not qualify.   Lung Cancer Screening Referral: no  Additional Screening:  Hepatitis C Screening: does not qualify; Completed no  Vision Screening: Recommended annual ophthalmology exams for early detection of glaucoma and other disorders of the eye. Is the patient up to date with their annual eye exam?  Yes  Who is the provider or what is the name of the office in which the patient attends annual eye  exams? [facility] If pt is not established with a provider, would they like to be referred to a provider to establish care? No .   Dental Screening: Recommended annual dental exams for proper oral hygiene  Community Resource Referral / Chronic Care Management: CRR required this visit?  No   CCM required this visit?  No      Plan:     I have personally reviewed and noted the following in the patient's chart:   Medical and social history Use of alcohol, tobacco or illicit drugs  Current medications and supplements including opioid prescriptions. Patient is not currently taking opioid prescriptions. Functional ability and status Nutritional status Physical activity Advanced directives List of other physicians Hospitalizations, surgeries, and ER visits in previous 12 months Vitals Screenings to include cognitive, depression, and falls Referrals and appointments  In addition, I have reviewed and discussed with patient certain preventive protocols, quality metrics, and best practice recommendations. A written personalized care plan for preventive services as well as general preventive health recommendations were provided to patient.     Sheral Flow, LPN   [id number]   Nurse Notes: N/A

## 2022-05-25 ENCOUNTER — Telehealth: Payer: Self-pay | Admitting: Internal Medicine

## 2022-05-25 NOTE — Telephone Encounter (Signed)
For our records:  We have received Pre-Op PW from [facility] for the pt and it has been placed in Dr. Frutoso Chase boxes.   Upon completion please fax to:  [phone]

## 2022-05-28 ENCOUNTER — Telehealth: Payer: Self-pay

## 2022-05-28 NOTE — Telephone Encounter (Signed)
Called patient to get her schedule for an EKG before her shoulder surgery

## 2022-06-10 ENCOUNTER — Ambulatory Visit: Payer: Medicare PPO | Admitting: Internal Medicine

## 2022-06-10 ENCOUNTER — Encounter: Payer: Self-pay | Admitting: Internal Medicine

## 2022-06-10 VITALS — BP 140/100 | HR 81 | Temp 97.4°F | Ht 61.0 in | Wt 182.0 lb

## 2022-06-10 DIAGNOSIS — Z01818 Encounter for other preprocedural examination: Secondary | ICD-10-CM

## 2022-06-10 NOTE — Assessment & Plan Note (Signed)
EKG done today without change from prior 2022. She is not having any chest pains or SOB with exertion. BP is moderately elevated without medication today. Prior readings all at goal on meds. Advised to take meds day of surgery. Discussed likely worsening constipation after surgery due to medications and need for bowel regimen to help. Overall low risk and proceed as planned with surgery.

## 2022-06-10 NOTE — Patient Instructions (Signed)
We will clear you for the surgery make sure to have something on hand to help with likely constipation from medicine after the surgery.

## 2022-06-10 NOTE — Progress Notes (Signed)
   Subjective:   Patient ID: Catherine Hoffman, female    DOB: 1941/05/26, 82 y.o.   MRN: [id number]  HPI The patient is an 82 YO female coming in for pre-op assessment. Right shoulder replacement. No chest pains or SOB. Did not take BP meds today at all. BP normal with meds  PMH, [place], social history reviewed and updated  Review of Systems  Constitutional: Negative.   HENT: Negative.    Eyes: Negative.   Respiratory:  Negative for cough, chest tightness and shortness of breath.   Cardiovascular:  Negative for chest pain, palpitations and leg swelling.  Gastrointestinal:  Negative for abdominal distention, abdominal pain, constipation, diarrhea, nausea and vomiting.  Musculoskeletal:  Positive for arthralgias.  Skin: Negative.   Neurological: Negative.   Psychiatric/Behavioral: Negative.      Objective:  Physical Exam Constitutional:      Appearance: She is well-developed.  HENT:     Head: Normocephalic and atraumatic.  Cardiovascular:     Rate and Rhythm: Normal rate and regular rhythm.  Pulmonary:     Effort: Pulmonary effort is normal. No respiratory distress.     Breath sounds: Normal breath sounds. No wheezing or rales.  Abdominal:     General: Bowel sounds are normal. There is no distension.     Palpations: Abdomen is soft.     Tenderness: There is no abdominal tenderness. There is no rebound.  Musculoskeletal:        General: Tenderness present.     Cervical back: Normal range of motion.  Skin:    General: Skin is warm and dry.  Neurological:     Mental Status: She is alert and oriented to person, place, and time.     Coordination: Coordination normal.     Vitals:   06/10/22 1027  BP: (!) 140/100  Pulse: 81  Temp: (!) 97.4 F (36.3 C)  TempSrc: Oral  SpO2: 98%  Weight: 182 lb (82.6 kg)  Height: 5\' 1"  (1.549 m)   EKG: Rate 63, axis normal, interval normal, sinus, possible left atrial enlargement likely not significant, no st or t wave changes, no significant  change compared to prior 2022   Assessment & Plan:  Visit time 20 minutes in face to face communication with patient and coordination of care, additional 10 minutes spent in record review, coordination or care, ordering tests, communicating/referring to other healthcare professionals, documenting in medical records all on the same day of the visit for total time 30 minutes spent on the visit.

## 2022-06-16 ENCOUNTER — Other Ambulatory Visit: Payer: Self-pay | Admitting: Internal Medicine

## 2022-06-16 MED ORDER — ASPIRIN 81 MG PO CHEW: 81.0000 mg | CHEWABLE_TABLET | Freq: Every day | ORAL | 1 refills | Status: AC

## 2022-06-16 NOTE — Addendum Note (Signed)
Addended by: Earnstine Regal on: 06/16/2022 03:04 PM   Modules accepted: Orders

## 2022-06-16 NOTE — Telephone Encounter (Signed)
Resent aspirin 1st Transmission to pharmacy failed (06/16/2022  2:45

## 2022-07-10 ENCOUNTER — Ambulatory Visit: Payer: Medicare PPO | Admitting: Family Medicine

## 2022-07-10 ENCOUNTER — Ambulatory Visit (INDEPENDENT_AMBULATORY_CARE_PROVIDER_SITE_OTHER): Payer: Medicare PPO

## 2022-07-10 VITALS — BP 128/82 | HR 78 | Ht 61.0 in | Wt 182.0 lb

## 2022-07-10 DIAGNOSIS — R29898 Other symptoms and signs involving the musculoskeletal system: Secondary | ICD-10-CM

## 2022-07-10 DIAGNOSIS — M5416 Radiculopathy, lumbar region: Secondary | ICD-10-CM

## 2022-07-10 DIAGNOSIS — M545 Low back pain, unspecified: Secondary | ICD-10-CM | POA: Diagnosis not present

## 2022-07-10 NOTE — Progress Notes (Signed)
I, Peterson Lombard, LAT, ATC acting as a scribe for Lynne Leader, MD.  TAKEELA PEIL is a 82 y.o. female who presents to [place] at [facility] today for cont'd L hip pain. Pt was previously seen by Dr. Tamala Julian on 05/19/22 and was given a L GT steroid injection. Pt only got 2-3 wks from prior GT steroid injection. Pt locates pain to the L-side of her low back and into L buttock.   Radiates: yes- lateral aspect of the L leg to L foot/toes LE numbness/tingling: no Aggravates: L-side lying Treatments tried: Tylenol, meloxicam   Pertinent review of systems: No fevers or chills.  2-year history of falls.  She does have a walker but does not use them.  Relevant historical information: History of lumbar radiculopathy and lumbar spinal stenosis.   Exam:  BP 128/82   Pulse 78   Ht 5\' 1"  (1.549 m)   Wt 182 lb (82.6 kg)   SpO2 93%   BMI 34.39 kg/m  General: Well Developed, well nourished, and in no acute distress.   MSK: L-spine: Normal appearing Nontender to palpation spinal midline. Decreased lumbar motion. Lower extremity strength decreased left leg Hip flexion: Right 5/5 left 4/5. Knee extension right 5/5 left 4/5. Knee flexion right 5/5 left 4/5. Hip abduction right 5/5 left 4+/5. Hip adduction 5/5 bilaterally. Foot dorsiflexion right 5/5 left 4/5. Foot plantarflexion 5/5 bilateral   Lab and Radiology Results  X-ray images lumbar spine obtained today personally and independently interpreted Multilevel DDD.  No acute fractures are visible. Await formal radiology review   EXAM: MRI LUMBAR SPINE WITHOUT CONTRAST   TECHNIQUE: Multiplanar, multisequence MR imaging of the lumbar spine was performed. No intravenous contrast was administered.   COMPARISON:  [facility] CT Abdomen and Pelvis 04/20/2018. Lumbar radiographs 04/21/2018.   FINDINGS: Segmentation:  Normal on the comparisons.   Alignment: Stable straightening of lumbar  lordosis mild chronic levoconvex lumbar scoliosis.   Vertebrae: Advanced chronic degenerative endplate changes at A8-T4 eccentric to the right. Lesser lower lumbar degenerative endplate changes elsewhere. No marrow edema or evidence of acute osseous abnormality. Normal background bone marrow signal. Intact visible sacrum and SI joints. L1 benign vertebral body hemangioma on the left.   Conus medullaris and cauda equina: Conus extends to the L1 level. No lower spinal cord or conus signal abnormality.   Paraspinal and other soft tissues: Negative; chronic benign appearing left renal cyst.   Disc levels:   No lower thoracic spinal stenosis.   L1-L2:  Negative.   L2-L3: Disc space loss and circumferential disc bulge with broad-based left foraminal component. Superimposed small right lateral recess component of cephalad disc extrusion (series 5, image 7 and series 6, image 15. Superimposed mild to moderate posterior element hypertrophy. Mild spinal stenosis. Moderate right lateral recess stenosis (right L3 nerve level). Mild to moderate bilateral L2 foraminal stenosis.   L3-L4: Vacuum disc here in 2019. Disc space loss with bulky circumferential disc osteophyte complex eccentric to the right. Moderate posterior element hypertrophy. Moderate spinal and right lateral recess stenosis. Mild left and moderate to severe right L3 foraminal stenosis.   L4-L5: Bulky circumferential disc bulge, especially central component of broad-based disc (series 6, image 26), and left subarticular disc (series 5, image 9). Moderate to severe posterior element hypertrophy. Degenerative facet joint fluid. Severe spinal and left lateral recess stenosis (left L5 nerve level). Moderate to severe left L4 foraminal stenosis. Mild to moderate right lateral recess stenosis and mild right  foraminal stenosis.   L5-S1: Circumferential disc bulge and endplate spurring eccentric to the left. Moderate to severe  facet hypertrophy. Degenerative facet joint fluid. Moderate to severe left and moderate right lateral recess stenosis (S1 nerve levels). Borderline to mild spinal stenosis. Moderate to severe left and mild right L5 foraminal stenosis.   IMPRESSION: 1. Advanced chronic disc degeneration except at L1. Advanced endplate degeneration most pronounced at L3-L4. 2. L4-L5 Severe spinal, left lateral recess, and left foraminal stenosis. 3. L3-L4 moderate spinal, right lateral recess stenosis, and moderate to severe right L3 foraminal stenosis. 4. L5-S1 moderate to severe left greater than right lateral recess stenosis and left foraminal stenosis. 5. L2-L3 mild spinal and moderate right lateral recess stenosis.     Electronically Signed   By: Genevie Ann M.D.   On: 05/14/2019 15:46 I, Lynne Leader, personally (independently) visualized and performed the interpretation of the images attached in this note.   Assessment and Plan: 82 y.o. female with lumbar radiculopathy affecting primarily the left leg in an L5 dermatomal pattern.  Patient does have some weakness in multiple nerve root patterns that apparently is chronic ongoing for over 2 years.  The pain however is more acute worsening over the last few weeks.  Pain is most consistent with L5.  She had a left hip greater trochanter injection in December that only worked for a few days.  Based on her old MRI from 2020 and current symptoms we will proceed with an epidural steroid injection.  If this does not work well would recommend repeat lumbar spine MRI especially given her weakness.  Recommend using a walker at home.  She does have a walker.   PDMP not reviewed this encounter. Orders Placed This Encounter  Procedures   DG INJECT DIAG/THERA/INC NEEDLE/CATH/PLC EPI/LUMB/SAC W/IMG    Standing Status:   Future    Standing Expiration Date:   07/11/2023    Order Specific Question:   Reason for Exam (SYMPTOM  OR DIAGNOSIS REQUIRED)    Answer:   ESI/NRB  left lumbar radiculopathy likely L5. Level and technique per radiology    Order Specific Question:   Preferred Imaging Location?    Answer:   [id number]    Order Specific Question:   Radiology Contrast Protocol - do NOT remove file path    Answer:   [email or web address]   DG Lumbar Spine 2-3 Views    Standing Status:   Future    Number of Occurrences:   1    Standing Expiration Date:   07/11/2023    Order Specific Question:   Reason for Exam (SYMPTOM  OR DIAGNOSIS REQUIRED)    Answer:   eval lumbar rad left leg    Order Specific Question:   Preferred imaging location?    Answer:   [facility]   No orders of the defined types were placed in this encounter.    Discussed warning signs or symptoms. Please see discharge instructions. Patient expresses understanding.   The above documentation has been reviewed and is accurate and complete Lynne Leader, M.D.

## 2022-07-10 NOTE — Patient Instructions (Signed)
Thank you for coming in today.   Please get an Xray today before you leave   Please call [facility] at [phone] to schedule your spine injection.    Let me know how it goes.   It not much better next step should be MRI lumbar spine.   Use a walker. A bad fall will rob you of your independence.

## 2022-07-13 NOTE — Progress Notes (Signed)
Lumbar spine x-ray shows medium arthritis changes.

## 2022-07-20 ENCOUNTER — Other Ambulatory Visit: Payer: Medicare PPO

## 2022-07-21 ENCOUNTER — Ambulatory Visit: Payer: Medicare PPO | Admitting: Family Medicine

## 2022-07-22 ENCOUNTER — Ambulatory Visit
Admission: RE | Admit: 2022-07-22 | Discharge: 2022-07-22 | Disposition: A | Discharge status: Home / Self Care | Payer: Medicare PPO | Source: Ambulatory Visit | Attending: Family Medicine | Admitting: Family Medicine

## 2022-07-22 DIAGNOSIS — R29898 Other symptoms and signs involving the musculoskeletal system: Secondary | ICD-10-CM

## 2022-07-22 DIAGNOSIS — M4727 Other spondylosis with radiculopathy, lumbosacral region: Secondary | ICD-10-CM | POA: Diagnosis not present

## 2022-07-22 DIAGNOSIS — M5416 Radiculopathy, lumbar region: Secondary | ICD-10-CM

## 2022-07-22 MED ORDER — METHYLPREDNISOLONE ACETATE 40 MG/ML INJ SUSP (RADIOLOG
80.0000 mg | Freq: Once | INTRAMUSCULAR | Status: AC
  Administered 2022-07-22: 80 mg via EPIDURAL

## 2022-07-22 MED ORDER — IOPAMIDOL (ISOVUE-M 200) INJECTION 41%
1.0000 mL | Freq: Once | INTRAMUSCULAR | Status: AC
  Administered 2022-07-22: 1 mL via EPIDURAL

## 2022-07-22 NOTE — Discharge Instructions (Signed)

## 2022-07-24 ENCOUNTER — Telehealth: Payer: Self-pay | Admitting: Family Medicine

## 2022-07-24 NOTE — Telephone Encounter (Signed)
Called pt, no answer/VM not set up - unable to leave VM.

## 2022-07-24 NOTE — Telephone Encounter (Signed)
You had an epidural steroid injection on Wednesday.  If that injection does not work my next step was to proceed with an MRI.   Are you feeling any better?

## 2022-07-27 ENCOUNTER — Ambulatory Visit (INDEPENDENT_AMBULATORY_CARE_PROVIDER_SITE_OTHER): Payer: Medicare PPO | Admitting: Internal Medicine

## 2022-07-27 ENCOUNTER — Encounter: Payer: Self-pay | Admitting: Internal Medicine

## 2022-07-27 VITALS — BP 120/80 | HR 91 | Temp 97.4°F | Ht 61.0 in | Wt 178.0 lb

## 2022-07-27 DIAGNOSIS — R21 Rash and other nonspecific skin eruption: Secondary | ICD-10-CM

## 2022-07-27 MED ORDER — PREDNISONE 20 MG PO TABS: 40.0000 mg | ORAL_TABLET | Freq: Every day | ORAL | 0 refills | Status: DC

## 2022-07-27 MED ORDER — TRIAMCINOLONE ACETONIDE 0.1 % EX CREA
1.0000 | TOPICAL_CREAM | Freq: Two times a day (BID) | CUTANEOUS | 0 refills | Status: DC
Start: 2022-07-27 — End: 2023-02-09

## 2022-07-27 NOTE — Patient Instructions (Signed)
We have sent in prednisone to take for the reaction 2 pills daily for 5 days.  We have sent in triamcinolone ointment to use twice a day.

## 2022-07-27 NOTE — Assessment & Plan Note (Signed)
Suspect allergic reaction to ingredient of recent injection as symptoms started 1-2 days after injection. May be the lidocaine component. Rx prednisone 5 day course and triamcinolone ointment to use. Using benadryl otc which is helping slightly.

## 2022-07-27 NOTE — Progress Notes (Signed)
   Subjective:   Patient ID: Catherine Hoffman, female    DOB: [birth year], 82 y.o.   MRN: [id number]  HPI The patient is an 82 YO female coming in for itching and bumps on legs. Recent lumbar injection 07/22/22 and this started after that.  Review of Systems  Constitutional: Negative.   HENT: Negative.    Eyes: Negative.   Respiratory:  Negative for cough, chest tightness and shortness of breath.   Cardiovascular:  Negative for chest pain, palpitations and leg swelling.  Gastrointestinal:  Negative for abdominal distention, abdominal pain, constipation, diarrhea, nausea and vomiting.  Musculoskeletal: Negative.   Skin:  Positive for rash.  Neurological: Negative.   Psychiatric/Behavioral: Negative.      Objective:  Physical Exam Constitutional:      Appearance: She is well-developed.  HENT:     Head: Normocephalic and atraumatic.  Cardiovascular:     Rate and Rhythm: Normal rate and regular rhythm.  Pulmonary:     Effort: Pulmonary effort is normal. No respiratory distress.     Breath sounds: Normal breath sounds. No wheezing or rales.  Abdominal:     General: Bowel sounds are normal. There is no distension.     Palpations: Abdomen is soft.     Tenderness: There is no abdominal tenderness. There is no rebound.  Musculoskeletal:     Cervical back: Normal range of motion.  Skin:    General: Skin is warm and dry.     Findings: Rash present.     Comments: Rash on legs and arms   Neurological:     Mental Status: She is alert and oriented to person, place, and time.     Coordination: Coordination normal.     Vitals:   07/27/22 1306  BP: 120/80  Pulse: 91  Temp: (!) 97.4 F (36.3 C)  TempSrc: Oral  SpO2: 99%  Weight: 178 lb (80.7 kg)  Height: 5' 1"$  (1.549 m)    Assessment & Plan:

## 2022-07-28 NOTE — Telephone Encounter (Signed)
Called pt, unable to leave VM as VM is not set up.

## 2022-08-04 NOTE — Telephone Encounter (Signed)
Called and spoke with patient, pt reports significant improvement of sx including back back s/p ESI. She will be 2 weeks s/p inj tomorrow. She is very pleased with the outcome of the injection.   Pt does note continued "weird feeling" in the lower leg near the ankle, "like something is in there". Pt was curious is there was anything that should could take for this or if Dr. Georgina Snell had any other recommendations of what she could do.

## 2022-08-13 ENCOUNTER — Other Ambulatory Visit: Payer: Self-pay | Admitting: Internal Medicine

## 2022-08-20 NOTE — Telephone Encounter (Signed)
You have that gabapentin you can take.  It can make you sleepy but could help that sensation you have in your ankle.  We may need to have another back injection.

## 2022-08-24 NOTE — Telephone Encounter (Signed)
Called pt, no answer, VM not set up.

## 2022-08-25 ENCOUNTER — Telehealth: Payer: Self-pay | Admitting: Internal Medicine

## 2022-08-25 NOTE — Telephone Encounter (Signed)
We have received  Surgical Clearance PW in the fax, to be filled out by provider. Patient requested to send it via Fax within 5-days. Document is located in providers tray at front office.Please advise at Mobile [phone] (mobile)   Please fax to: [phone]

## 2022-08-25 NOTE — Telephone Encounter (Signed)
I will check forms not sure what x-ray you are asking about? She has already had pre-op visit so likely no visit needed

## 2022-08-26 NOTE — Telephone Encounter (Signed)
Received and this has been faxed back over.

## 2022-09-10 ENCOUNTER — Other Ambulatory Visit: Payer: Self-pay | Admitting: Internal Medicine

## 2022-09-11 ENCOUNTER — Ambulatory Visit (HOSPITAL_COMMUNITY)
Admission: EM | Admit: 2022-09-11 | Discharge: 2022-09-11 | Disposition: A | Discharge status: Home / Self Care | Payer: Medicare PPO | Attending: Emergency Medicine | Admitting: Emergency Medicine

## 2022-09-11 ENCOUNTER — Encounter (HOSPITAL_COMMUNITY): Payer: Self-pay

## 2022-09-11 DIAGNOSIS — R051 Acute cough: Secondary | ICD-10-CM | POA: Diagnosis not present

## 2022-09-11 DIAGNOSIS — J302 Other seasonal allergic rhinitis: Secondary | ICD-10-CM | POA: Diagnosis not present

## 2022-09-11 MED ORDER — ALBUTEROL SULFATE HFA 108 (90 BASE) MCG/ACT IN AERS: 2.0000 | INHALATION_SPRAY | Freq: Four times a day (QID) | RESPIRATORY_TRACT | 1 refills | Status: DC | PRN

## 2022-09-11 MED ORDER — DEXTROMETHORPHAN POLISTIREX ER 30 MG/5ML PO SUER: 30.0000 mg | Freq: Two times a day (BID) | ORAL | 0 refills | Status: DC

## 2022-09-11 NOTE — ED Triage Notes (Signed)
Pt states dry cough and her voice is hoarse for the past 2 days.  States she has a history of seasonal allergies. Has been taking OTC allergy medication at home.

## 2022-09-11 NOTE — Discharge Instructions (Addendum)
Take a half tablet of zyrtec daily for the next several weeks to months. Nasal spray once or twice daily  Use the inhaler every 6 hours (3x daily) for the next several days.  You can use the cough syrup Delsym twice daily.  Make sure you are drinking lots of fluids!  Use spoonful of honey or cough lozenges as needed  Please return if symptoms worsen

## 2022-09-11 NOTE — ED Provider Notes (Signed)
[facility]    CSN: [id number] Arrival date & time: 09/11/22  1008     History   Chief Complaint Chief Complaint  Patient presents with   Cough    HPI San JettyLue B Hedglin is a 82 y.o. female.  Here with 2 day history of dry cough. Hoarse voice but denies sore throat. No runny nose or congestion. No fevers Has used zyrtec once that helped  History of chronic rhinitis, asthma, GERD, HTN  Past Medical History:  Diagnosis Date   Arthritis    Cataract    removed both eyes    Episodic tension type headache    Esophageal reflux    GERD (gastroesophageal reflux disease)    Glaucoma    Heart murmur    as young adult   Hyperlipidemia    Hypertension    Internal hemorrhoids    Kidney stone on left side Hx-date unknown   Left rotator cuff tear     Patient Active Problem List   Diagnosis Date Noted   Pre-op evaluation 06/10/2022   Greater trochanteric bursitis, left 05/20/2022   Myalgia 03/11/2022   Overactive bladder 03/11/2022   Right shoulder pain 01/28/2022   Degenerative arthritis of left knee 11/18/2021   Radial head fracture 11/18/2021   Breast pain, left 08/05/2021   Headache 04/15/2021   Acute sinusitis 12/06/2020   Atypical chest pain 09/06/2020   Primary osteoarthritis of right knee 09/05/2019   DDD (degenerative disc disease), lumbar 06/28/2018   Rash 03/18/2018   Venous insufficiency 10/21/2016   Routine general medical examination at a health care facility 02/27/2016   Asthma, mild intermittent 07/19/2015   Essential hypertension 09/28/2010   RENAL CALCULUS 08/08/2010   Hyperlipidemia 02/25/2009   Constipation 10/04/2008   Low back pain 10/04/2008   Chronic allergic rhinitis 09/21/2008   Obesity 06/30/2007   ACID REFLUX DISEASE 06/30/2007    Past Surgical History:  Procedure Laterality Date   COLONOSCOPY  11/20/2008   COLONOSCOPY     KNEE ARTHROSCOPY Left 2004   SHOULDER OPEN ROTATOR CUFF REPAIR Left 08/30/2012   Procedure: ROTATOR CUFF  REPAIR SHOULDER OPEN, POSSIBLE GRAFT AND ANCHORS;  Surgeon: Jacki Conesonald A Gioffre, MD;  [facility];  Service: Orthopedics;  Laterality: Left;   TOTAL ABDOMINAL HYSTERECTOMY W/ BILATERAL SALPINGOOPHORECTOMY  1990's   TOTAL KNEE ARTHROPLASTY Right 09/05/2019   Procedure: RIGHT TOTAL KNEE ARTHROPLASTY;  Surgeon: Marcene Corningalldorf, Peter, MD;  Location: [facility];  Service: Orthopedics;  Laterality: Right;    OB History   No obstetric history on file.      Home Medications    Prior to Admission medications   Medication Sig Start Date End Date Taking? Authorizing Provider  albuterol (VENTOLIN HFA) 108 (90 Base) MCG/ACT inhaler Inhale 2 puffs into the lungs every 6 (six) hours as needed for wheezing or shortness of breath. 09/11/22  Yes Elleah Hemsley, Lurena Joinerebecca, PA-C  dextromethorphan (DELSYM) 30 MG/5ML liquid Take 5 mLs (30 mg total) by mouth 2 (two) times daily. 09/11/22  Yes Sutton Plake, Lurena Joinerebecca, PA-C  acetaminophen (TYLENOL) 500 MG tablet Take 1,000 mg by mouth every 6 (six) hours as needed for moderate pain.     [provider]  aspirin ([employer] ASPIRIN ADULT LOW DOSE) 81 MG chewable tablet Chew 1 tablet (81 mg total) by mouth daily. 06/16/22   Myrlene Brokerrawford, Elizabeth A, MD  calcium-vitamin D (OSCAL WITH D) 500-200 MG-UNIT per tablet Take 1 tablet by mouth daily with breakfast.    [provider]  cetirizine (  ZYRTEC) 10 MG tablet TAKE 1 TABLET BY MOUTH EVERY DAY 08/26/21   Myrlene Brokerrawford, Elizabeth A, MD  Cholecalciferol (VITAMIN D3) 50 MCG (2000 UT) TABS Take 2,000 Units by mouth daily.     [provider]  fluticasone (FLONASE) 50 MCG/ACT nasal spray PLACE 2 SPRAYS INTO BOTH NOSTRILS DAILY. SHAKE LIQUID AND USE 2 SPRAYS [facility] 01/15/20   Myrlene Brokerrawford, Elizabeth A, MD  latanoprost (XALATAN) 0.005 % ophthalmic solution Place 1 drop into both eyes at bedtime.    [provider]  meloxicam (MOBIC) 15 MG tablet TAKE 1 TABLET BY MOUTH EVERY DAY 08/13/22   Myrlene Brokerrawford, Elizabeth A, MD   omeprazole (PRILOSEC) 40 MG capsule TAKE 1 CAPSULE BY MOUTH TWICE DAILY 30 MINUTES BEFORE FIRST AND LAST MEAL OF THE DAY 05/05/22   Myrlene Brokerrawford, Elizabeth A, MD  oxybutynin (DITROPAN-XL) 5 MG 24 hr tablet TAKE 1 TABLET BY MOUTH EVERYDAY AT BEDTIME 09/10/22   Myrlene Brokerrawford, Elizabeth A, MD  potassium chloride SA (KLOR-CON M20) 20 MEQ tablet TAKE 1 TABLET BY MOUTH EVERY DAY 05/05/22   Myrlene Brokerrawford, Elizabeth A, MD  spironolactone (ALDACTONE) 50 MG tablet Take 1 tablet (50 mg total) by mouth daily. 03/10/22   Myrlene Brokerrawford, Elizabeth A, MD  triamcinolone cream (KENALOG) 0.1 % Apply 1 Application topically 2 (two) times daily. 07/27/22   Myrlene Brokerrawford, Elizabeth A, MD    Family History Family History  Problem Relation Age of Onset   Hypertension Mother    Diabetes Mother    Cancer Mother        in leg   Varicose Veins Maternal Uncle    Colon cancer Neg Hx    Colon polyps Neg Hx    Esophageal cancer Neg Hx    Stomach cancer Neg Hx    Rectal cancer Neg Hx     Social History Social History   Tobacco Use   Smoking status: Never   Smokeless tobacco: Never  Vaping Use   Vaping Use: Never used  Substance Use Topics   Alcohol use: No   Drug use: No     Allergies   Hydrocodone-acetaminophen and Tramadol   Review of Systems Review of Systems As per HPI  Physical Exam  Updated Vital Signs BP 112/77 (BP Location: Right Arm)   Pulse 72   Temp 98.3 F (36.8 C) (Oral)   Resp 16   SpO2 96%   Physical Exam Vitals and nursing note reviewed.  Constitutional:      General: She is not in acute distress. HENT:     Right Ear: Tympanic membrane and ear canal normal.     Left Ear: Tympanic membrane and ear canal normal.     Nose: No congestion or rhinorrhea.     Mouth/Throat:     Mouth: Mucous membranes are moist.     Pharynx: Oropharynx is clear. No posterior oropharyngeal erythema.  Eyes:     Conjunctiva/sclera: Conjunctivae normal.  Cardiovascular:     Rate and Rhythm: Normal rate and regular  rhythm.     Heart sounds: Normal heart sounds.  Pulmonary:     Effort: Pulmonary effort is normal.     Breath sounds: Normal breath sounds. No wheezing, rhonchi or rales.     Comments: Dry reactive cough in clinic.  Musculoskeletal:     Cervical back: Normal range of motion.  Lymphadenopathy:     Cervical: No cervical adenopathy.  Skin:    General: Skin is warm and dry.  Neurological:     Mental Status:  She is alert and oriented to person, place, and time.     UC Treatments / Results  Labs (all labs ordered are listed, but only abnormal results are displayed) Labs Reviewed - No data to display  EKG  Radiology No results found.  Procedures Procedures  Medications Ordered in UC Medications - No data to display  Initial Impression / Assessment and Plan / UC Course  I have reviewed the triage vital signs and the nursing notes.  Pertinent labs & imaging results that were available during my care of the patient were reviewed by me and considered in my medical decision making (see chart for details).  Afebrile, well appearing, clear lungs. Cough is dry, reactive to deep breath. Will continue daily zyrtec, add nasal spray. Inhaler q6 hours prn  Delsym BID prn Fluids, lozenges, honey Return precautions discussed   Final Clinical Impressions(s) / UC Diagnoses   Final diagnoses:  Seasonal allergies  Acute cough     Discharge Instructions      Take a half tablet of zyrtec daily for the next several weeks to months. Nasal spray once or twice daily  Use the inhaler every 6 hours (3x daily) for the next several days.  You can use the cough syrup Delsym twice daily.  Make sure you are drinking lots of fluids!  Use spoonful of honey or cough lozenges as needed  Please return if symptoms worsen     ED Prescriptions     Medication Sig Dispense Auth. Provider   albuterol (VENTOLIN HFA) 108 (90 Base) MCG/ACT inhaler Inhale 2 puffs into the lungs every 6 (six) hours  as needed for wheezing or shortness of breath. 8 g Lauralynn Loeb, PA-C   dextromethorphan (DELSYM) 30 MG/5ML liquid Take 5 mLs (30 mg total) by mouth 2 (two) times daily. 89 mL Donnika Kucher, Lurena Joiner, PA-C      PDMP not reviewed this encounter.   Marlow Baars, [place] 09/11/22 1109

## 2022-09-14 ENCOUNTER — Ambulatory Visit: Payer: Medicare PPO | Admitting: Internal Medicine

## 2022-10-13 ENCOUNTER — Encounter: Payer: Self-pay | Admitting: Internal Medicine

## 2022-10-13 ENCOUNTER — Ambulatory Visit: Payer: Medicare PPO | Admitting: Internal Medicine

## 2022-10-13 VITALS — BP 122/84 | HR 65 | Temp 98.2°F | Ht 61.0 in | Wt 185.0 lb

## 2022-10-13 DIAGNOSIS — R21 Rash and other nonspecific skin eruption: Secondary | ICD-10-CM | POA: Diagnosis not present

## 2022-10-13 DIAGNOSIS — M5136 Other intervertebral disc degeneration, lumbar region: Secondary | ICD-10-CM | POA: Diagnosis not present

## 2022-10-13 DIAGNOSIS — M1712 Unilateral primary osteoarthritis, left knee: Secondary | ICD-10-CM

## 2022-10-13 MED ORDER — METHYLPREDNISOLONE ACETATE 40 MG/ML IJ SUSP
40.0000 mg | Freq: Once | INTRAMUSCULAR | Status: AC
  Administered 2022-10-13: 40 mg via INTRAMUSCULAR

## 2022-10-13 NOTE — Patient Instructions (Signed)
We have given you the steroid shot today.

## 2022-10-13 NOTE — Progress Notes (Signed)
   Subjective:   Patient ID: Catherine Hoffman, female    DOB: 18-Dec-1940, 82 y.o.   MRN: [id number]  HPI The patient is an 82 YO female coming in for allergy symptoms. Taking zyrtec and benadryl and flonase and nothing is helping much. Having puffy eyes as well.   Review of Systems  Constitutional:  Positive for activity change. Negative for appetite change, chills, fatigue, fever and unexpected weight change.  HENT:  Positive for congestion, postnasal drip, rhinorrhea and sinus pressure. Negative for ear discharge, ear pain, sinus pain, sneezing, sore throat, tinnitus, trouble swallowing and voice change.   Eyes: Negative.   Respiratory:  Positive for cough. Negative for chest tightness, shortness of breath and wheezing.   Cardiovascular: Negative.   Gastrointestinal: Negative.   Musculoskeletal:  Positive for myalgias.  Neurological: Negative.     Objective:  Physical Exam Constitutional:      Appearance: She is well-developed.  HENT:     Head: Normocephalic and atraumatic.     Comments: Oropharynx with redness and clear drainage, nose with swollen turbinates, TMs normal bilaterally.  Neck:     Thyroid: No thyromegaly.  Cardiovascular:     Rate and Rhythm: Normal rate and regular rhythm.  Pulmonary:     Effort: Pulmonary effort is normal. No respiratory distress.     Breath sounds: Normal breath sounds. No wheezing or rales.  Abdominal:     Palpations: Abdomen is soft.  Musculoskeletal:        General: Tenderness present.     Cervical back: Normal range of motion.  Lymphadenopathy:     Cervical: No cervical adenopathy.  Skin:    General: Skin is warm and dry.  Neurological:     Mental Status: She is alert and oriented to person, place, and time.     Vitals:   10/13/22 1549  BP: 122/84  Pulse: 65  Temp: 98.2 F (36.8 C)  TempSrc: Oral  SpO2: 95%  Weight: 185 lb (83.9 kg)  Height: 5\' 1"  (1.549 m)    Assessment & Plan:  Depo-medrol 40 mg IM given at visit

## 2022-10-16 ENCOUNTER — Encounter: Payer: Self-pay | Admitting: Internal Medicine

## 2022-10-16 NOTE — Assessment & Plan Note (Signed)
Associated with allergies and given depo-medrol 40 mg IM. There are no signs of sinus infection on exam so no antibiotics are indicated. Counseled we could consider allergy for shots to help with persistent symptoms if this does not work.

## 2022-10-16 NOTE — Assessment & Plan Note (Signed)
Needs referral to PT to help with pain.

## 2022-10-16 NOTE — Assessment & Plan Note (Signed)
Needs referral to PT to help with pain which is done today.

## 2022-10-31 ENCOUNTER — Other Ambulatory Visit: Payer: Self-pay | Admitting: Internal Medicine

## 2022-11-09 ENCOUNTER — Other Ambulatory Visit: Payer: Self-pay | Admitting: Internal Medicine

## 2022-11-16 ENCOUNTER — Telehealth: Payer: Self-pay

## 2022-11-16 NOTE — Patient Instructions (Addendum)
Visit Information  Thank you for taking time to visit with me today. Please don't hesitate to contact me if I can be of assistance to you.   Following are the goals we discussed today:  Continue to take medications as prescribed Continue to attend provider appointments as scheduled  Our next appointment is by telephone on 12/08/22 at 10:00 am  Please call the care guide team at [phone] if you need to cancel or reschedule your appointment.   If you are experiencing a Mental Health or Behavioral Health Crisis or need someone to talk to, please call the Suicide and Crisis Lifeline: 16  Kathyrn Sheriff, RN, MSN, BSN, CCM [facility] Care Coordinator [phone]

## 2022-11-16 NOTE — Patient Outreach (Signed)
  Care Coordination   Initial Visit Note   11/16/2022 Name: Catherine Hoffman MRN: [id number] DOB: 1940/07/20  Catherine Hoffman is a 82 y.o. year old female who sees Myrlene Broker, MD for primary care. I spoke with  [name] by phone today.  What matters to the patients health and wellness today? Patient lives alone in an apartment. She reports has a care that she is not driving now. She states friends provide transportation for her. She reports her family is in [place]. Catherine Hoffman report limited movement history of low back pain, r knee replacement. She reports she needs a replacement on her left knee, but is not going to have it. She is requesting assistance with getting a motorized wheelchair.  Goals Addressed             This Visit's Progress    Care Coordination Activities       Interventions Today    Flowsheet Row Most Recent Value  Chronic Disease   Chronic disease during today's visit Hypertension (HTN), Other  [low back pain. osteoarthritis of right knee,  degenerative arthritis of the left knee.]  General Interventions   General Interventions Discussed/Reviewed General Interventions Discussed, Communication with, Durable Medical Equipment (DME)  Durable Medical Equipment (DME) Wheelchair  Wheelchair Motorized  [patient interestd in getting a motorized wheelchair]  Communication with Social Work  Exercise Interventions   Exercise Discussed/Reviewed Exercise Discussed  [discussed physical therapy]  Education Interventions   Education Provided Provided Education  Provided Verbal Education On Other, When to see the doctor  Pharmacy Interventions   Pharmacy Dicussed/Reviewed Pharmacy Topics Discussed  [medications reviewed]  Safety Interventions   Safety Discussed/Reviewed Safety Discussed            SDOH assessments and interventions completed:    SDOH Interventions Today    Flowsheet Row Most Recent Value  SDOH Interventions   Food Insecurity Interventions  Intervention Not Indicated  Housing Interventions Intervention Not Indicated  Transportation Interventions Intervention Not Indicated  Utilities Interventions Intervention Not Indicated     Care Coordination Interventions:     Follow up plan: 12/08/22   Encounter Outcome:  Pt. Visit Completed   Kathyrn Sheriff, RN, MSN, BSN, CCM [facility] Care Coordinator [phone]

## 2022-11-18 ENCOUNTER — Ambulatory Visit: Payer: Medicare PPO

## 2022-11-18 NOTE — Patient Instructions (Signed)
Visit Information  Thank you for taking time to visit with me today. Please don't hesitate to contact me if I can be of assistance to you.   Following are the goals we discussed today:  Expect a phone call from outpatient rehab on [address], [place] [phone]. Feel free to contact them if you do not receive a call within the week.  Our next appointment is by telephone on 12/08/22 at 10 am  Please call the care guide team at [phone] if you need to cancel or reschedule your appointment.   If you are experiencing a Mental Health or Behavioral Health Crisis or need someone to talk to, please call the Suicide and Crisis Lifeline: 37  Kathyrn Sheriff, RN, MSN, BSN, CCM [facility] Care Coordinator [phone]

## 2022-11-18 NOTE — Patient Outreach (Signed)
  Care Coordination   Care coordination  Visit Note   11/18/2022 Name: [name] MRN: [id number] DOB: [birth year]  Catherine Hoffman is a 82 y.o. year old female who sees Myrlene Broker, MD for primary care. I spoke with  [name] by phone today.  What matters to the patients health and wellness today?  RNCM called to follow up on referral sent on 10/14/22. Spoke with Renae at outpatient center on [address] who states they made an unsuccessful call made to patient/unable to leave message. Renae also states they do not complete evaluations for scotter/motorized wheelchair. She states She reports [facility] provides this service and transferred the referral to [address]. RNCM contacted neurorehab-rvoice message left. RNCM awaiting return call.   Goals Addressed             This Visit's Progress    Care Coordination Activities       Interventions Today    Flowsheet Row Most Recent Value  General Interventions   General Interventions Discussed/Reviewed Communication with  Communication with --  [[facility] [facility] [address] and [place] [facility] [address]]  Education Interventions   Education Provided Provided Education  [facility] provided contact number for outpatient rehab [address] [phone]. instructed to be expecting a call to schedule appointment.]            SDOH assessments and interventions completed:  No  Care Coordination Interventions:  Yes, provided   Follow up plan:  as previously scheduled 12/08/22    Encounter Outcome:  Pt. Visit Completed   Kathyrn Sheriff, RN, MSN, BSN, CCM [facility] Care Coordinator [phone]

## 2022-11-19 ENCOUNTER — Ambulatory Visit (INDEPENDENT_AMBULATORY_CARE_PROVIDER_SITE_OTHER): Payer: Medicare PPO

## 2022-11-19 ENCOUNTER — Ambulatory Visit: Payer: Self-pay

## 2022-11-19 ENCOUNTER — Ambulatory Visit (INDEPENDENT_AMBULATORY_CARE_PROVIDER_SITE_OTHER): Payer: Medicare PPO | Admitting: Family Medicine

## 2022-11-19 ENCOUNTER — Encounter: Payer: Self-pay | Admitting: Family Medicine

## 2022-11-19 ENCOUNTER — Telehealth: Payer: Self-pay

## 2022-11-19 VITALS — BP 118/82 | HR 70 | Ht 61.0 in | Wt 180.0 lb

## 2022-11-19 DIAGNOSIS — R29898 Other symptoms and signs involving the musculoskeletal system: Secondary | ICD-10-CM

## 2022-11-19 DIAGNOSIS — M25552 Pain in left hip: Secondary | ICD-10-CM | POA: Diagnosis not present

## 2022-11-19 DIAGNOSIS — M5416 Radiculopathy, lumbar region: Secondary | ICD-10-CM

## 2022-11-19 NOTE — Patient Instructions (Signed)
Thank you for coming in today.  You received an injection today. Seek immediate medical attention if the joint becomes red, extremely painful, or is oozing fluid.  Please get an Xray today before you leave  We are ordering MRI's of your lower back and left hip.

## 2022-11-19 NOTE — Patient Outreach (Signed)
  Care Coordination   Follow Up Visit Note   11/19/2022 Name: Catherine Hoffman MRN: [id number] DOB: 15-Jun-1940  Catherine Hoffman is a 82 y.o. year old female who sees Myrlene Broker, MD for primary care. I spoke with  [name] by phone today.  What matters to the patients health and wellness today?  [name] expresses she spoke with neuro rehab regarding evaluation for motorized scooter/wheelchair. She reports PT that was scheduled for 12/03/22 she thinks was canceled, but not sure. She reports an appointment with Dr. Clementeen Graham scheduled for this afternoon. [name] states she will discuss her pain with Dr. Kandee Keen today and request physical therapy referral if that is his recommendation.  Goals Addressed             This Visit's Progress    Care Coordination Activities       Interventions Today    Flowsheet Row Most Recent Value  Chronic Disease   Chronic disease during today's visit Other  [low back pain. osteoarthritis of right knee,  degenerative arthritis of the left knee]  General Interventions   General Interventions Discussed/Reviewed General Interventions Reviewed, Doctor Visits  Doctor Visits Discussed/Reviewed Doctor Visits Discussed, Specialist  Education Interventions   Education Provided Provided Education  Provided Verbal Education On Other  [Encouraged to follow up/attend visit with sports medicine provider re: her pain. reiterated with patient if physical therapy recommended per neuro rehab a separate order was needed for evaluation and treatment for pain. Ms. Scarboro voiced understanding.]            SDOH assessments and interventions completed:  No  Care Coordination Interventions:  Yes, provided   Follow up plan:  as previously scheduled    Encounter Outcome:  Pt. Visit Completed   Kathyrn Sheriff, RN, MSN, BSN, CCM [facility] Care Coordinator [phone]

## 2022-11-19 NOTE — Patient Outreach (Signed)
  Care Coordination   Collaboration/Care Coordination  Visit Note   11/19/2022 Name: Catherine Hoffman MRN: [id number] DOB: Oct 06, 1940  Catherine Hoffman is a 82 y.o. year old female who sees Myrlene Broker, MD for primary care. I spoke with [facility] from [facility]  What matters to the patients health and wellness today?  RNCM received call from [facility] with [facility] who states she has contacted patient re: mobility eval for scooter/power chair. Per Seeley, they will call her back to schedule due a little wait period, possibly July. Patient choices for DME [employer]. Per [facility] patient will need a separate order for physical therapy for pain.  Goals Addressed             This Visit's Progress    Care Coordination Activities       Interventions Today    Flowsheet Row Most Recent Value  Chronic Disease   Chronic disease during today's visit Other, Hypertension (HTN)  [low back pain. osteoarthritis of right knee,  degenerative arthritis of the left knee]  General Interventions   General Interventions Discussed/Reviewed Communication with  [confirmed [facility] has communicated with patient. per [facility]-current order for mobility scooter/power chair evaluation only. reports patient will need a separate order for therapy.]            SDOH assessments and interventions completed:  No  Care Coordination Interventions:  Yes, provided   Follow up plan:  follow up as scheduled    Encounter Outcome:  Pt. Visit Completed   Kathyrn Sheriff, RN, MSN, BSN, CCM [facility] Care Coordinator [phone]

## 2022-11-19 NOTE — Progress Notes (Signed)
I, Stevenson Clinch, CMA acting as a scribe for Clementeen Graham, MD.  GERALDINA WARTA is a 82 y.o. female who presents to [facility] at [facility] today for hip and low back pain. Pt was last seen by Dr. Denyse Amass on 07/10/20 for lumbar radiculopathy w/ L-side LE weakness and an ESI was ordered and later performed on 07/22/22.  Today, pt reports minimal relief of sx s/p ESI. Today, the lower back and the left hip are both bothersome. Notes difficulty with getting out of bed and with first morning steps. Ambulating with a cane today, interested in a scooter. Dr. Okey Dupre recommended PT, pt is willing to try if it will be helpful. C/O pain and swelling in left leg, funny feeling in the lower left leg.  Pain is severe in the left low back and left buttocks and lateral hip.  Prior greater trochanter injection did not help much.  Epidural steroid injection helped some but not a lot for like pain and discomfort.  Dx imaging: 07/10/22 L-spine XR  04/22/22 L-spine XR   Pertinent review of systems: No fevers or chills  Relevant historical information: Hypertension.   Exam:  BP 118/82   Pulse 70   Ht 5\' 1"  (1.549 m)   Wt 180 lb (81.6 kg)   SpO2 96%   BMI 34.01 kg/m  General: Well Developed, well nourished, and in no acute distress.   MSK: L-spine nontender palpation spinal midline normal lumbar motion. Lower extremity strength is decreased hip abduction.  Otherwise lower extremity strength is intact. Positive straight leg raise test. Left hip normal. Tender palpation greater trochanter.  Very diminished strength of abduction and external rotation 3/5. Antalgic gait.    Lab and Radiology Results   Hip greater trochanteric injection: left Consent obtained and timeout performed. Area of maximum tenderness palpated and identified. Skin cleaned with alcohol, cold spray applied. A 22g needle was used to access the greater trochanteric bursa. 40mg  of Kenalog and 2 mL of Marcaine were  used to inject the trochanteric bursa. Patient tolerated the procedure well.  X-ray images left hip obtained today personally and independently interpreted No acute fractures.  No severe degenerative changes. Await formal radiology review   Assessment and Plan: 82 y.o. female with left low back pain extending to the buttocks and radiating down the leg.  This has been ongoing for months.  She was originally seen for this in December 2023 and subsequently had a greater trochanter injection in December and a subsequent epidural steroid injection in February.  This helped a little but not a lot.  She been doing home exercise program but has not done formal physical therapy.  Plan for further diagnostic imaging.  Plan for MRI lumbar spine and MRI left hip.  Last lumbar spine MRI was 2020.  She has never had a hip MRI.  Differential includes lumbar radiculopathy facet arthritis and hip abductor tendon tear.   PDMP not reviewed this encounter. Orders Placed This Encounter  Procedures   DG HIP UNILAT W OR W/O PELVIS 2-3 VIEWS LEFT    Standing Status:   Future    Number of Occurrences:   1    Standing Expiration Date:   12/19/2022    Order Specific Question:   Reason for Exam (SYMPTOM  OR DIAGNOSIS REQUIRED)    Answer:   left hip pain    Order Specific Question:   Preferred imaging location?    Answer:   [facility]   MR  LUMBAR SPINE WO CONTRAST    Standing Status:   Future    Standing Expiration Date:   12/19/2022    Order Specific Question:   What is the patient's sedation requirement?    Answer:   No Sedation    Order Specific Question:   Does the patient have a pacemaker or implanted devices?    Answer:   No    Order Specific Question:   Preferred imaging location?    Answer:   [facility] (table limit-550lbs)   MR HIP LEFT WO CONTRAST    Standing Status:   Future    Standing Expiration Date:   12/19/2022    Order Specific Question:   What is the patient's sedation  requirement?    Answer:   No Sedation    Order Specific Question:   Does the patient have a pacemaker or implanted devices?    Answer:   No    Order Specific Question:   Preferred imaging location?    Answer:   [facility] (table limit-550lbs)   Ambulatory referral to Physical Therapy    Referral Priority:   Routine    Referral Type:   Physical Medicine    Referral Reason:   Specialty Services Required    Requested Specialty:   Physical Therapy    Number of Visits Requested:   1   No orders of the defined types were placed in this encounter.    Discussed warning signs or symptoms. Please see discharge instructions. Patient expresses understanding.   The above documentation has been reviewed and is accurate and complete Clementeen Graham, M.D.

## 2022-11-26 NOTE — Progress Notes (Signed)
Left hip x-ray shows mild arthritis changes

## 2022-12-03 ENCOUNTER — Ambulatory Visit: Payer: Medicare PPO | Admitting: Physical Therapy

## 2022-12-03 ENCOUNTER — Encounter: Payer: Self-pay | Admitting: Physical Therapy

## 2022-12-03 ENCOUNTER — Ambulatory Visit: Payer: Medicare PPO | Attending: Family Medicine | Admitting: Physical Therapy

## 2022-12-03 DIAGNOSIS — M5459 Other low back pain: Secondary | ICD-10-CM

## 2022-12-03 DIAGNOSIS — M25552 Pain in left hip: Secondary | ICD-10-CM

## 2022-12-03 NOTE — Therapy (Signed)
OUTPATIENT PHYSICAL THERAPY THORACOLUMBAR EVALUATION   Patient Name: Catherine Hoffman MRN: [id number] DOB:Jul 09, 1940, 82 y.o., female Today's Date: 12/04/2022  END OF SESSION:  PT End of Session - 12/04/22 1555     Visit Number 1    Number of Visits 5    Date for PT Re-Evaluation 01/08/23    Authorization Type [employer]    Authorization Time Period 12-03-22 - 02-06-23    PT Start Time 0847    PT Stop Time 0931    PT Time Calculation (min) 44 min    Activity Tolerance Patient limited by pain    Behavior During Therapy [facility] for tasks assessed/performed             Past Medical History:  Diagnosis Date   Arthritis    Cataract    removed both eyes    Episodic tension type headache    Esophageal reflux    GERD (gastroesophageal reflux disease)    Glaucoma    Heart murmur    as young adult   Hyperlipidemia    Hypertension    Internal hemorrhoids    Kidney stone on left side Hx-date unknown   Left rotator cuff tear    Past Surgical History:  Procedure Laterality Date   COLONOSCOPY  11/20/2008   COLONOSCOPY     KNEE ARTHROSCOPY Left 2004   SHOULDER OPEN ROTATOR CUFF REPAIR Left 08/30/2012   Procedure: ROTATOR CUFF REPAIR SHOULDER OPEN, POSSIBLE GRAFT AND ANCHORS;  Surgeon: Jacki Cones, MD;  Location: [facility] [place];  Service: Orthopedics;  Laterality: Left;   TOTAL ABDOMINAL HYSTERECTOMY W/ BILATERAL SALPINGOOPHORECTOMY  1990's   TOTAL KNEE ARTHROPLASTY Right 09/05/2019   Procedure: RIGHT TOTAL KNEE ARTHROPLASTY;  Surgeon: Marcene Corning, MD;  Location: [facility];  Service: Orthopedics;  Laterality: Right;   Patient Active Problem List   Diagnosis Date Noted   Pre-op evaluation 06/10/2022   Greater trochanteric bursitis, left 05/20/2022   Myalgia 03/11/2022   Overactive bladder 03/11/2022   Right shoulder pain 01/28/2022   Degenerative arthritis of left knee 11/18/2021   Radial head fracture 11/18/2021   Breast pain, left 08/05/2021   Headache  04/15/2021   Acute sinusitis 12/06/2020   Atypical chest pain 09/06/2020   Primary osteoarthritis of right knee 09/05/2019   DDD (degenerative disc disease), lumbar 06/28/2018   Rash 03/18/2018   Venous insufficiency 10/21/2016   Routine general medical examination at a health care facility 02/27/2016   Asthma, mild intermittent 07/19/2015   Essential hypertension 09/28/2010   RENAL CALCULUS 08/08/2010   Hyperlipidemia 02/25/2009   Constipation 10/04/2008   Low back pain 10/04/2008   Chronic allergic rhinitis 09/21/2008   Obesity 06/30/2007   ACID REFLUX DISEASE 06/30/2007    PCP: Myrlene Broker., MD REFERRING PROVIDER: Rodolph Bong, MD  REFERRING DIAG: [phone] (ICD-10-CM) - Left hip pain  Rationale for Evaluation and Treatment: Rehabilitation  THERAPY DIAG:  Pain in left hip  Other low back pain  ONSET DATE: Referral date   SUBJECTIVE:  SUBJECTIVE STATEMENT: Pt reports she has had left low back pain and Lt hip pain for about 5 yrs.; reports she had PT at [facility] and it didn't help; also had aquatic therapy at [facility] about 5 yrs ago; pt states she also has swelling in left leg at times. Pt reports she is having severe pain in left hip today due to the cloudy weather. Pt is unable to sit on Lt hip due to c/o pain with weight bearing - leans to Rt side to avoid putting weight on Lt hip. Pt using SPC for assistance with ambulation.  Pt reports she has an appt for a scooter evaluation at end of July - Dr. Denyse Amass wanted her to try PT to possibly avoid need for scooter.  Pt wants scooter for mobility outside her home - I attempted to explain to pt that insurance (Medicare) would not pay for equipment unless it was necessary for in home mobility and ADL's.   PERTINENT HISTORY:  lumbar  radiculopathy w/ L-side LE weakness; Lt hip trochanteric bursitis - pt reports she received cortisone injection but says it did not help;  lumbar DDD, h/o Rt TKA March 2021  PAIN:  Are you having pain? Yes: NPRS scale: 10/10 Pain location: Lt hip and low back Pain description: tight, grimacing, pins and needles in the left lower legs Aggravating factors: sitting on Lt hip makes it worse - cloudy weather makes it worse Relieving factors: Tylenol   PRECAUTIONS: Fall  WEIGHT BEARING RESTRICTIONS: No  FALLS:  Has patient fallen in last 6 months? No  LIVING ENVIRONMENT: Lives with: lives alone Lives in: House/apartment Stairs: No Has following equipment at home: Single point cane and Environmental consultant - 2 wheeled  OCCUPATION: retired  PLOF: Independent with basic ADLs and does not drive - hasn't driven in about a year;  PATIENT GOALS: to try to reduce pain in Lt hip and low back  NEXT MD VISIT: no appt with Dr. Denyse Amass scheduled as of current time:  MRI scheduled 12-12-22 for Lt hip and low back  OBJECTIVE:   DIAGNOSTIC FINDINGS:  MRI scheduled 12-12-22   SCREENING FOR RED FLAGS: Bowel or bladder incontinence: No Spinal tumors: No Cauda equina syndrome: No Compression fracture: No Abdominal aneurysm: No  COGNITION: Overall cognitive status: Within functional limits for tasks assessed     SENSATION: WFL   POSTURE: rounded shoulders and forward head  PALPATION: Tenderness palpated over Lt hip greater trochanter region  LUMBAR ROM: WFL's   LOWER EXTREMITY ROM:   WFL's   LOWER EXTREMITY MMT:  pain with resistance with Lt hip flexion and Lt knee extension   MMT Right eval Left eval  Hip flexion  3- - unable to tolerate resistance  Hip extension    Hip abduction    Hip adduction    Hip internal rotation    Hip external rotation    Knee flexion    Knee extension  4- (pain with resistance)  Ankle dorsiflexion  4  Ankle plantarflexion    Ankle inversion    Ankle  eversion     (Blank rows = not tested)  FUNCTIONAL TESTS:  Timed up and go (TUG): 26.75 secs with SPC 10 meter walk test: 23.72 secs = 1.38 ft/sec   GAIT: Distance walked: 50' Assistive device utilized: Single point cane Level of assistance: Modified independence Comments: antalgic gait with decreased weight bearing on LLE  TODAY'S TREATMENT:  DATE: eval only    PATIENT EDUCATION:  Education details: discussed benefits of aquatic therapy - pt reports she would not be able to get transportation to go to [employer], dispensing); Comptroller for scooter - explained to pt that insurance would not pay for a scooter as it is not needed for independence with household mobility Person educated: Patient Education method: Explanation Education comprehension: needs further education   HOME EXERCISE PROGRAM: To be established  ASSESSMENT:  CLINICAL IMPRESSION: Patient is a 82 y.o. lady who was seen today for physical therapy evaluation and treatment for Lt hip pain.  Pt rated pain 10/10 at today's eval session, due to cloudy weather.  Pt reports pain is not always this severe.  Pt unable to sit and weight bear on Lt hip due to pain.  Pt has MRI scheduled for 12-12-22 to determine etiology of pain.  Pt reports she has had cortisone injection in the past for bursitis which did not help.  Pt will benefit from PT to address LLE pain and weakness.  Pt will benefit from trial of TENS unit for pain management.   OBJECTIVE IMPAIRMENTS: Abnormal gait, decreased balance, difficulty walking, decreased strength, and pain.   ACTIVITY LIMITATIONS: carrying, bending, sitting, standing, squatting, and locomotion level  PARTICIPATION LIMITATIONS: meal prep, cleaning, laundry, driving, shopping, community activity, and church  PERSONAL FACTORS: Age, Fitness, Past/current  experiences, Time since onset of injury/illness/exacerbation, Transportation, and 1 comorbidity: Lt hip pain of unknown etiology  are also affecting patient's functional outcome.   REHAB POTENTIAL: Fair due to chronicity of pain and lack of benefit from previous cortisone injections  CLINICAL DECISION MAKING: Evolving/moderate complexity  EVALUATION COMPLEXITY: Moderate   GOALS: Goals reviewed with patient? Yes   LONG TERM GOALS: Target date: 01-01-23  Pt will be independent in HEP for LLE strengthening and ITB stretching. Baseline:  Goal status: INITIAL  2.  Pt will report decreased pain in Lt hip to <6/10 at rest. Baseline: 10/10 intensity on 12-04-22 Goal status: INITIAL  3.  Trial of TENS unit for assist with pain management. Baseline:  Goal status: INITIAL   PLAN:  PT FREQUENCY: 1x/week  PT DURATION: 4 weeks  PLANNED INTERVENTIONS: Therapeutic exercises, Therapeutic activity, Neuromuscular re-education, Balance training, Gait training, Patient/Family education, Self Care, Electrical stimulation, Moist heat, Ultrasound, Ionotophoresis 4mg /ml Dexamethasone, and Manual therapy.  PLAN FOR NEXT SESSION: establish HEP for LLE stretching (ITB) and strengthening   Melisa Donofrio, Donavan Burnet, PT 12/04/2022, 6:54 PM

## 2022-12-08 ENCOUNTER — Ambulatory Visit: Payer: Self-pay

## 2022-12-08 NOTE — Patient Instructions (Signed)
Visit Information  Thank you for taking time to visit with me today. Please don't hesitate to contact me if I can be of assistance to you.   Following are the goals we discussed today:  Continue to take medications as prescribed Continue to attend provider visits as scheduled Contact your provider with health questions or concerns as needed Continue to maintain fall prevention strategies  Our next appointment is by telephone on 01/21/23 at 10:00  Please call the care guide team at [phone] if you need to cancel or reschedule your appointment.   If you are experiencing a Mental Health or Behavioral Health Crisis or need someone to talk to, please call the Suicide and Crisis Lifeline: 63   Kathyrn Sheriff, RN, MSN, BSN, CCM [facility] Care Coordinator [phone]

## 2022-12-08 NOTE — Patient Outreach (Signed)
  Care Coordination   Follow Up Visit Note   12/08/2022 Name: Catherine Hoffman MRN: [id number] DOB: Apr 24, 1941  Catherine Hoffman is a 82 y.o. year old female who sees Myrlene Broker, MD for primary care. I spoke with  Catherine Hoffman by phone today.  What matters to the patients health and wellness today?  Reports being followd by Dr. Clementeen Graham, sports medicine for pain. She states she is scheduled for MRI on 12/12/22 and will start rehab on 12/14/22. She expresses plans to go on away with church family for her birthday.   Goals Addressed             This Visit's Progress    Care Coordination Activities       Interventions Today    Flowsheet Row Most Recent Value  Chronic Disease   Chronic disease during today's visit Other  [back pain and lower extremity pain]  General Interventions   General Interventions Discussed/Reviewed General Interventions Reviewed, Doctor Visits  Doctor Visits Discussed/Reviewed Doctor Visits Discussed, Doctor Visits Reviewed, Specialist  [reviewed patient intructions per sports medicine visit]  PCP/Specialist Visits Compliance with follow-up visit  [reiterated the importance of following up with provider as recommended.]  Education Interventions   Education Provided Provided Education  [advised when planning for out of town trip, ensure she takes all medications, and contact number for providers]  Provided Verbal Education On Other  [encouraged to continue to take medications as prescribed, attend provider visits as recommended, continue with fall prevention strategies discussed previously.]  Pharmacy Interventions   Pharmacy Dicussed/Reviewed Pharmacy Topics Reviewed  Safety Interventions   Safety Discussed/Reviewed Fall Risk, Safety Reviewed            SDOH assessments and interventions completed:  No  Care Coordination Interventions:  Yes, provided   Follow up plan: Follow up call scheduled for 01/21/23    Encounter Outcome:  Pt. Visit Completed    Kathyrn Sheriff, RN, MSN, BSN, CCM [facility] Care Coordinator [phone]

## 2022-12-12 ENCOUNTER — Other Ambulatory Visit: Payer: Medicare PPO

## 2022-12-14 ENCOUNTER — Ambulatory Visit: Payer: Medicare PPO | Admitting: Physical Therapy

## 2022-12-15 ENCOUNTER — Other Ambulatory Visit: Payer: Self-pay | Admitting: Family Medicine

## 2022-12-15 ENCOUNTER — Telehealth: Payer: Self-pay | Admitting: Family Medicine

## 2022-12-15 ENCOUNTER — Ambulatory Visit: Payer: Medicare PPO | Admitting: Emergency Medicine

## 2022-12-15 DIAGNOSIS — M25552 Pain in left hip: Secondary | ICD-10-CM

## 2022-12-15 DIAGNOSIS — M5416 Radiculopathy, lumbar region: Secondary | ICD-10-CM

## 2022-12-15 NOTE — Telephone Encounter (Signed)
Patient called stating that she had to cancel her MRI due to having a cough and not being able to lay still. She said that the soonest they could get her in would be the 22nd or 28th but they wanted to make sure it was okay with Dr Catherine Hoffman.  Please advise.

## 2022-12-15 NOTE — Telephone Encounter (Signed)
Called and spoke with patient, she would like Rx sent in for cough suppressant. She will call [facility] back to schedule MRI for soonest available/ mid-end of July.   Forwarding to Dr. Denyse Amass to send script.

## 2022-12-15 NOTE — Telephone Encounter (Signed)
I am okay with that.  Please let me know if you need any cough suppression medicine before the next MRI date.

## 2022-12-16 MED ORDER — GUAIFENESIN-CODEINE 100-10 MG/5ML PO SOLN: 5.0000 mL | Freq: Four times a day (QID) | ORAL | 0 refills | Status: AC | PRN

## 2022-12-16 NOTE — Telephone Encounter (Signed)
Called pt and advised Rx was sent in to [facility]. Pt verbalized understanding and will contact the pharmacy regarding pick up.

## 2022-12-16 NOTE — Telephone Encounter (Signed)
Cough syrup that contains opiates sent to the [facility] pharmacy on [address]

## 2022-12-17 ENCOUNTER — Ambulatory Visit: Payer: Medicare PPO | Admitting: Emergency Medicine

## 2022-12-21 ENCOUNTER — Ambulatory Visit: Payer: Medicare PPO | Attending: Internal Medicine | Admitting: Physical Therapy

## 2022-12-21 DIAGNOSIS — M25552 Pain in left hip: Secondary | ICD-10-CM | POA: Diagnosis not present

## 2022-12-21 DIAGNOSIS — R262 Difficulty in walking, not elsewhere classified: Secondary | ICD-10-CM | POA: Insufficient documentation

## 2022-12-21 DIAGNOSIS — M5459 Other low back pain: Secondary | ICD-10-CM | POA: Insufficient documentation

## 2022-12-21 NOTE — Therapy (Unsigned)
OUTPATIENT PHYSICAL THERAPY THORACOLUMBAR TREATMENT NOTE   Patient Name: Catherine Hoffman MRN: [id number] DOB:03/19/1941, 82 y.o., female Today's Date: 12/22/2022  END OF SESSION:  PT End of Session - 12/22/22 2018     Visit Number 2    Number of Visits 5    Date for PT Re-Evaluation 01/08/23    Authorization Type [employer]    Authorization Time Period 12-03-22 - 02-02-23    Authorization - Visit Number 2    Authorization - Number of Visits 6    PT Start Time 0847    PT Stop Time 0930    PT Time Calculation (min) 43 min    Activity Tolerance Patient limited by pain    Behavior During Therapy [facility] for tasks assessed/performed              Past Medical History:  Diagnosis Date   Arthritis    Cataract    removed both eyes    Episodic tension type headache    Esophageal reflux    GERD (gastroesophageal reflux disease)    Glaucoma    Heart murmur    as young adult   Hyperlipidemia    Hypertension    Internal hemorrhoids    Kidney stone on left side Hx-date unknown   Left rotator cuff tear    Past Surgical History:  Procedure Laterality Date   COLONOSCOPY  11/20/2008   COLONOSCOPY     KNEE ARTHROSCOPY Left 2004   SHOULDER OPEN ROTATOR CUFF REPAIR Left 08/30/2012   Procedure: ROTATOR CUFF REPAIR SHOULDER OPEN, POSSIBLE GRAFT AND ANCHORS;  Surgeon: Jacki Cones, MD;  Location: [facility];  Service: Orthopedics;  Laterality: Left;   TOTAL ABDOMINAL HYSTERECTOMY W/ BILATERAL SALPINGOOPHORECTOMY  1990's   TOTAL KNEE ARTHROPLASTY Right 09/05/2019   Procedure: RIGHT TOTAL KNEE ARTHROPLASTY;  Surgeon: Marcene Corning, MD;  Location: [facility];  Service: Orthopedics;  Laterality: Right;   Patient Active Problem List   Diagnosis Date Noted   Pre-op evaluation 06/10/2022   Greater trochanteric bursitis, left 05/20/2022   Myalgia 03/11/2022   Overactive bladder 03/11/2022   Right shoulder pain 01/28/2022   Degenerative arthritis of left knee 11/18/2021    Radial head fracture 11/18/2021   Breast pain, left 08/05/2021   Headache 04/15/2021   Acute sinusitis 12/06/2020   Atypical chest pain 09/06/2020   Primary osteoarthritis of right knee 09/05/2019   DDD (degenerative disc disease), lumbar 06/28/2018   Rash 03/18/2018   Venous insufficiency 10/21/2016   Routine general medical examination at a health care facility 02/27/2016   Asthma, mild intermittent 07/19/2015   Essential hypertension 09/28/2010   RENAL CALCULUS 08/08/2010   Hyperlipidemia 02/25/2009   Constipation 10/04/2008   Low back pain 10/04/2008   Chronic allergic rhinitis 09/21/2008   Obesity 06/30/2007   ACID REFLUX DISEASE 06/30/2007    PCP: Myrlene Broker., MD REFERRING PROVIDER: Rodolph Bong, MD  REFERRING DIAG: [phone] (ICD-10-CM) - Left hip pain  Rationale for Evaluation and Treatment: Rehabilitation  THERAPY DIAG:  Pain in left hip  ONSET DATE: Referral date   SUBJECTIVE:  SUBJECTIVE STATEMENT: Pt reports MRI appt has been rescheduled for 01-03-23 (from 12-12-22);  pt reporting severe Lt hip pain - using SPC for assistance with ambulation.  Pt reports she is not able to drive - has to rely on other people for her transportation.  Pt reports she is unable to do aquatic therapy because of lack of transportation.   PERTINENT HISTORY:  lumbar radiculopathy w/ L-side LE weakness; Lt hip trochanteric bursitis - pt reports she received cortisone injection but says it did not help;  lumbar DDD, h/o Rt TKA March 2021  PAIN:  Are you having pain? Yes: NPRS scale: 10/10 Pain location: Lt hip and low back Pain description: tight, grimacing, pins and needles in the left lower legs Aggravating factors: sitting on Lt hip makes it worse - cloudy weather makes it worse Relieving  factors: Tylenol   PRECAUTIONS: Fall  WEIGHT BEARING RESTRICTIONS: No  FALLS:  Has patient fallen in last 6 months? No  LIVING ENVIRONMENT: Lives with: lives alone Lives in: House/apartment Stairs: No Has following equipment at home: Single point cane and Environmental consultant - 2 wheeled  OCCUPATION: retired  PLOF: Independent with basic ADLs and does not drive - hasn't driven in about a year;  PATIENT GOALS: to try to reduce pain in Lt hip and low back  NEXT MD VISIT: no appt with Dr. Denyse Amass scheduled as of current time:  MRI scheduled 01-03-23 for Lt hip and low back  OBJECTIVE:   DIAGNOSTIC FINDINGS:  MRI scheduled 01-03-23  GAIT: Distance walked: 72' Assistive device utilized: Single point cane Level of assistance: Modified independence Comments: antalgic gait with decreased weight bearing on LLE  TODAY'S TREATMENT:                                                                                                                              DATE:  12-21-22  Attempted exercises in supine position - pt unable to lie flat on her back due to inability to lie on her Lt hip - therefore, unable to tolerate exercises in supine Rt sidelying position - clam shell exercise LLE 10 reps x 2 sets  Seated Lt hamstring stretch with pt's Lt foot placed on PT's lower leg - 15 sec hold x 1 rep  Standing exercises - used UE support on bar at mirror;  standing Lt hip flexion, extension and abduction 10 reps each leg with rest breaks as needed due to c/o pain with AROM Standing Lt hip flexion with knee flexed attempted - pt reported pain with this exercise so it was discontinued  SciFit level 2.0 x 5" with Ue's and LE's  Medbridge HEP: Access Code: [email or web address] URL: [email or web address] Date: 12/22/2022 Prepared by: Maebelle Munroe  Exercises - Seated Hamstring Stretch  - 1 x daily - 7 x weekly - 1 sets - 1-2 reps - 20-30 sec hold - Seated Hamstring Stretch with Chair  - 1 x daily - 7 x  weekly -  1 sets - 1-2 reps - 20-30 sec hold - Beginner Clam  - 1 x daily - 7 x weekly - 1 sets - 10 reps - 3 sec hold hold - Standing Hip Abduction with Counter Support  - 1 x daily - 7 x weekly - 1 sets - 10 reps - Standing Hip Extension with Counter Support  - 1 x daily - 7 x weekly - 1 sets - 10 reps - Standing Hip Flexion AROM  - 1 x daily - 7 x weekly - 1 sets - 10 reps - Seated Hamstring Curl with Anchored Resistance  - 1 x daily - 7 x weekly - 1 sets - 10 reps   PATIENT EDUCATION:  Education details:  Luna Fuse Person educated: Patient Education method: Explanation Education comprehension: needs further education   HOME EXERCISE PROGRAM: Medbridge   ASSESSMENT:  CLINICAL IMPRESSION: PT session focused on Lt hamstring stretching and strengthening and Lt hip AROM in standing.  Pt unable to tolerate lying supine due to c/o Lt hip pain and pt unable to perform LLE SLS, even with UE support, due to c/o Lt hip pain.  MRI has been rescheduled for 01-03-23.  Pt would benefit from aquatic therapy due to pain experienced with exercise on land, but pt reports she does not have transportation to get to [facility] for this service.  Will cont to treat as pt able to tolerate.    OBJECTIVE IMPAIRMENTS: Abnormal gait, decreased balance, difficulty walking, decreased strength, and pain.   ACTIVITY LIMITATIONS: carrying, bending, sitting, standing, squatting, and locomotion level  PARTICIPATION LIMITATIONS: meal prep, cleaning, laundry, driving, shopping, community activity, and church  PERSONAL FACTORS: Age, Fitness, Past/current experiences, Time since onset of injury/illness/exacerbation, Transportation, and 1 comorbidity: Lt hip pain of unknown etiology  are also affecting patient's functional outcome.   REHAB POTENTIAL: Fair due to chronicity of pain and lack of benefit from previous cortisone injections  CLINICAL DECISION MAKING: Evolving/moderate  complexity  EVALUATION COMPLEXITY: Moderate   GOALS: Goals reviewed with patient? Yes   LONG TERM GOALS: Target date: 01-01-23  Pt will be independent in HEP for LLE strengthening and ITB stretching. Baseline:  Goal status: INITIAL  2.  Pt will report decreased pain in Lt hip to <6/10 at rest. Baseline: 10/10 intensity on 12-04-22 Goal status: INITIAL  3.  Trial of TENS unit for assist with pain management. Baseline:  Goal status: INITIAL   PLAN:  PT FREQUENCY: 1x/week  PT DURATION: 4 weeks  PLANNED INTERVENTIONS: Therapeutic exercises, Therapeutic activity, Neuromuscular re-education, Balance training, Gait training, Patient/Family education, Self Care, Electrical stimulation, Moist heat, Ultrasound, Ionotophoresis 4mg /ml Dexamethasone, and Manual therapy.  PLAN FOR NEXT SESSION: establish HEP for LLE stretching (ITB) and strengthening   Kirsten Spearing, Donavan Burnet, PT 12/22/2022, 8:21 PM

## 2022-12-22 ENCOUNTER — Encounter: Payer: Self-pay | Admitting: Physical Therapy

## 2022-12-27 ENCOUNTER — Other Ambulatory Visit: Payer: Medicare PPO

## 2023-01-03 ENCOUNTER — Ambulatory Visit
Admission: RE | Admit: 2023-01-03 | Discharge: 2023-01-03 | Disposition: A | Discharge status: Home / Self Care | Payer: Medicare PPO | Source: Ambulatory Visit | Attending: Family Medicine | Admitting: Family Medicine

## 2023-01-03 DIAGNOSIS — G8929 Other chronic pain: Secondary | ICD-10-CM | POA: Diagnosis not present

## 2023-01-03 DIAGNOSIS — M5416 Radiculopathy, lumbar region: Secondary | ICD-10-CM

## 2023-01-03 DIAGNOSIS — M25552 Pain in left hip: Secondary | ICD-10-CM | POA: Diagnosis not present

## 2023-01-03 DIAGNOSIS — M48062 Spinal stenosis, lumbar region with neurogenic claudication: Secondary | ICD-10-CM | POA: Diagnosis not present

## 2023-01-04 ENCOUNTER — Encounter: Payer: Self-pay | Admitting: Physical Therapy

## 2023-01-04 ENCOUNTER — Ambulatory Visit: Payer: Medicare PPO | Admitting: Physical Therapy

## 2023-01-04 DIAGNOSIS — R262 Difficulty in walking, not elsewhere classified: Secondary | ICD-10-CM | POA: Diagnosis not present

## 2023-01-04 DIAGNOSIS — M5459 Other low back pain: Secondary | ICD-10-CM | POA: Diagnosis not present

## 2023-01-04 DIAGNOSIS — M25552 Pain in left hip: Secondary | ICD-10-CM | POA: Diagnosis not present

## 2023-01-04 NOTE — Therapy (Signed)
OUTPATIENT PHYSICAL THERAPY THORACOLUMBAR TREATMENT NOTE   Patient Name: Catherine Hoffman MRN: [id number] DOB:[birth year], 82 y.o., female Today's Date: 01/04/2023  END OF SESSION:  PT End of Session - 01/04/23 1848     Visit Number 3    Number of Visits 5    Date for PT Re-Evaluation 01/08/23    Authorization Type [employer]    Authorization Time Period 12-03-22 - 02-02-23    Authorization - Visit Number 3    Authorization - Number of Visits 6    PT Start Time 0847    PT Stop Time 0930    PT Time Calculation (min) 43 min    Activity Tolerance Patient limited by pain    Behavior During Therapy [facility] for tasks assessed/performed               Past Medical History:  Diagnosis Date   Arthritis    Cataract    removed both eyes    Episodic tension type headache    Esophageal reflux    GERD (gastroesophageal reflux disease)    Glaucoma    Heart murmur    as young adult   Hyperlipidemia    Hypertension    Internal hemorrhoids    Kidney stone on left side Hx-date unknown   Left rotator cuff tear    Past Surgical History:  Procedure Laterality Date   COLONOSCOPY  11/20/2008   COLONOSCOPY     KNEE ARTHROSCOPY Left 2004   SHOULDER OPEN ROTATOR CUFF REPAIR Left 08/30/2012   Procedure: ROTATOR CUFF REPAIR SHOULDER OPEN, POSSIBLE GRAFT AND ANCHORS;  Surgeon: Jacki Cones, MD;  Location: [facility];  Service: Orthopedics;  Laterality: Left;   TOTAL ABDOMINAL HYSTERECTOMY W/ BILATERAL SALPINGOOPHORECTOMY  1990's   TOTAL KNEE ARTHROPLASTY Right 09/05/2019   Procedure: RIGHT TOTAL KNEE ARTHROPLASTY;  Surgeon: Marcene Corning, MD;  Location: [facility];  Service: Orthopedics;  Laterality: Right;   Patient Active Problem List   Diagnosis Date Noted   Pre-op evaluation 06/10/2022   Greater trochanteric bursitis, left 05/20/2022   Myalgia 03/11/2022   Overactive bladder 03/11/2022   Right shoulder pain 01/28/2022   Degenerative arthritis of left knee 11/18/2021    Radial head fracture 11/18/2021   Breast pain, left 08/05/2021   Headache 04/15/2021   Acute sinusitis 12/06/2020   Atypical chest pain 09/06/2020   Primary osteoarthritis of right knee 09/05/2019   DDD (degenerative disc disease), lumbar 06/28/2018   Rash 03/18/2018   Venous insufficiency 10/21/2016   Routine general medical examination at a health care facility 02/27/2016   Asthma, mild intermittent 07/19/2015   Essential hypertension 09/28/2010   RENAL CALCULUS 08/08/2010   Hyperlipidemia 02/25/2009   Constipation 10/04/2008   Low back pain 10/04/2008   Chronic allergic rhinitis 09/21/2008   Obesity 06/30/2007   ACID REFLUX DISEASE 06/30/2007    PCP: Myrlene Broker., MD REFERRING PROVIDER: Rodolph Bong, MD  REFERRING DIAG: [phone] (ICD-10-CM) - Left hip pain  Rationale for Evaluation and Treatment: Rehabilitation  THERAPY DIAG:  Pain in left hip  Other low back pain  ONSET DATE: Referral date 11-19-22   SUBJECTIVE:  SUBJECTIVE STATEMENT: Pt reports she had MRI yesterday (Sunday morning at 7:00) - had MRI of Lt hip and also low back;  pt states she hasn't received results yet.  Is having a lot of hip pain today - attributes a lot of it to the weather.  Pt states she wasn't able to do the exercises given for HEP due to increased pain, but says she did do exercise on her pedaler for her legs  PERTINENT HISTORY:  lumbar radiculopathy w/ L-side LE weakness; Lt hip trochanteric bursitis - pt reports she received cortisone injection but says it did not help;  lumbar DDD, h/o Rt TKA March 2021  PAIN:  Are you having pain? Yes: NPRS scale: 10/10 Pain location: Lt hip and low back Pain description: tight, grimacing, pins and needles in the left lower legs Aggravating factors: sitting on  Lt hip makes it worse - cloudy weather makes it worse Relieving factors: Tylenol   PRECAUTIONS: Fall  WEIGHT BEARING RESTRICTIONS: No  FALLS:  Has patient fallen in last 6 months? No  LIVING ENVIRONMENT: Lives with: lives alone Lives in: House/apartment Stairs: No Has following equipment at home: Single point cane and Environmental consultant - 2 wheeled  OCCUPATION: retired  PLOF: Independent with basic ADLs and does not drive - hasn't driven in about a year;  PATIENT GOALS: to try to reduce pain in Lt hip and low back  NEXT MD VISIT: no appt with Dr. Denyse Amass scheduled as of current time:  MRI scheduled 01-03-23 for Lt hip and low back  OBJECTIVE:   DIAGNOSTIC FINDINGS:  MRI scheduled 01-03-23  GAIT: Distance walked: 47' Assistive device utilized: Single point cane Level of assistance: Modified independence Comments: antalgic gait with decreased weight bearing on LLE  TODAY'S TREATMENT:                                                                                                                              DATE:  01-04-23  TherEx:  Seated Lt hamstring stretch with pt's Lt foot placed on stool  - 15 sec hold x 1 rep LAQ's LLE - 10 reps with 3 sec hold - no resistance used Lt hip flexion seated 10 reps  Supine position: Pelvic tilt 5 reps 3 sec hold Rt trunk rotation stretch for Lt lateral trunk stretch - 1 rep 15 sec hold Hip flexion in hooklying position 10 reps LLE heel slides 5 reps 2 sets Lt hip abdct. In hooklying position 10 reps  In Rt sidelying position - Lt clam shell 10 reps LLE hip abdct. With min assist 10 reps (knee extended)   Modified HEP to include exercises in non-weightbearing position for increased ease and comfort of Lt hip Medbridge Access Code: [email or web address] URL: [email or web address] Date: 01/04/2023 Prepared by: Maebelle Munroe  Exercises - new HEP - 01-04-23 - Supine Posterior Pelvic Tilt  - 1 x daily - 7 x weekly - 1 sets - 10 reps - 3 sec  hold - Supine Lower Trunk Rotation  - 1 x daily - 7 x weekly - 1 sets - 4 reps - 15 sec hold - Supine Heel Slide  - 1 x daily - 7 x weekly - 1 sets - 10 reps - Seated Long Arc Quad  - 1 x daily - 7 x weekly - 1 sets - 10 reps - Bent Knee Fallouts  - 1 x daily - 7 x weekly - 1 sets - 10 reps - Supine March  - 1 x daily - 7 x weekly - 1 sets - 10 reps  Medbridge HEP: Access Code: [email or web address] URL: [email or web address] Date: 12/22/2022 Prepared by: Maebelle Munroe  Exercises - Seated Hamstring Stretch  - 1 x daily - 7 x weekly - 1 sets - 1-2 reps - 20-30 sec hold - Seated Hamstring Stretch with Chair  - 1 x daily - 7 x weekly - 1 sets - 1-2 reps - 20-30 sec hold - Beginner Clam  - 1 x daily - 7 x weekly - 1 sets - 10 reps - 3 sec hold hold - Standing Hip Abduction with Counter Support  - 1 x daily - 7 x weekly - 1 sets - 10 reps - Standing Hip Extension with Counter Support  - 1 x daily - 7 x weekly - 1 sets - 10 reps - Standing Hip Flexion AROM  - 1 x daily - 7 x weekly - 1 sets - 10 reps - Seated Hamstring Curl with Anchored Resistance  - 1 x daily - 7 x weekly - 1 sets - 10 reps   PATIENT EDUCATION:  Education details:  Luna Fuse Person educated: Patient Education method: Explanation Education comprehension: needs further education   HOME EXERCISE PROGRAM: Medbridge   ASSESSMENT:  CLINICAL IMPRESSION: PT session focused on stretching and strengthening exercises for Lt hip musc.  Pt has only attended 2 sessions since initial eval on 12-03-22 due to scheduling of appts postponed due to scheduled MRI on 01-03-23.  Pt rates Lt hip pain 10/10 in today's session, same intensity as reported in previous session 2 weeks ago.  Pt unable to tolerate  strengthening exercises due to c/o Lt hip pain; unable to perform Lt SLR and Lt hip abdct. Independently in anti-gravity position due to c/o Lt hip pain.  HEP modified to include exercises in supine position, as pt was able to  tolerate lying supine in today's session.  Cont with POC.   OBJECTIVE IMPAIRMENTS: Abnormal gait, decreased balance, difficulty walking, decreased strength, and pain.   ACTIVITY LIMITATIONS: carrying, bending, sitting, standing, squatting, and locomotion level  PARTICIPATION LIMITATIONS: meal prep, cleaning, laundry, driving, shopping, community activity, and church  PERSONAL FACTORS: Age, Fitness, Past/current experiences, Time since onset of injury/illness/exacerbation, Transportation, and 1 comorbidity: Lt hip pain of unknown etiology  are also affecting patient's functional outcome.   REHAB POTENTIAL: Fair due to chronicity of pain and lack of benefit from previous cortisone injections  CLINICAL DECISION MAKING: Evolving/moderate complexity  EVALUATION COMPLEXITY: Moderate   GOALS: Goals reviewed with patient? Yes   LONG TERM GOALS: Target date: 01-01-23;  extend 01-11-23  Pt will be independent in HEP for LLE strengthening and ITB stretching. Baseline:  Goal status: Ongoing  2.  Pt will report decreased pain in Lt hip to <6/10 at rest. Baseline: 10/10 intensity on 12-04-22 & on 01-04-23 Goal status: Ongoing  3.  Trial of TENS unit for assist with pain management. Baseline:  Goal status: Deferred until MRI results  obtained - 01-04-23   PLAN:  PT FREQUENCY: 1x/week  PT DURATION: 4 weeks  PLANNED INTERVENTIONS: Therapeutic exercises, Therapeutic activity, Neuromuscular re-education, Balance training, Gait training, Patient/Family education, Self Care, Electrical stimulation, Moist heat, Ultrasound, Ionotophoresis 4mg /ml Dexamethasone, and Manual therapy.  PLAN FOR NEXT SESSION: check new HEP issued on 01-04-23 for any questions and/or concerns; MRI results?   Kary Kos, PT 01/04/2023, 6:49 PM

## 2023-01-05 ENCOUNTER — Ambulatory Visit: Payer: Medicare PPO | Admitting: Physical Therapy

## 2023-01-05 ENCOUNTER — Encounter: Payer: Self-pay | Admitting: Physical Therapy

## 2023-01-05 DIAGNOSIS — M5459 Other low back pain: Secondary | ICD-10-CM | POA: Diagnosis not present

## 2023-01-05 DIAGNOSIS — R262 Difficulty in walking, not elsewhere classified: Secondary | ICD-10-CM

## 2023-01-05 DIAGNOSIS — M25552 Pain in left hip: Secondary | ICD-10-CM

## 2023-01-05 NOTE — Therapy (Signed)
OUTPATIENT PHYSICAL THERAPY WHEELCHAIR EVALUATION   Patient Name: Catherine Hoffman MRN: [id number] DOB:01/15/1941, 82 y.o., female Today's Date: 01/05/2023  END OF SESSION:  PT End of Session - 01/05/23 1239     Visit Number 1    Number of Visits 1    Date for PT Re-Evaluation 01/05/23    Authorization Type [employer]    PT Start Time 1235    PT Stop Time 1330    PT Time Calculation (min) 55 min    Equipment Utilized During Treatment Gait belt    Activity Tolerance Patient tolerated treatment well    Behavior During Therapy WFL for tasks assessed/performed             Past Medical History:  Diagnosis Date   Arthritis    Cataract    removed both eyes    Episodic tension type headache    Esophageal reflux    GERD (gastroesophageal reflux disease)    Glaucoma    Heart murmur    as young adult   Hyperlipidemia    Hypertension    Internal hemorrhoids    Kidney stone on left side Hx-date unknown   Left rotator cuff tear    Past Surgical History:  Procedure Laterality Date   COLONOSCOPY  11/20/2008   COLONOSCOPY     KNEE ARTHROSCOPY Left 2004   SHOULDER OPEN ROTATOR CUFF REPAIR Left 08/30/2012   Procedure: ROTATOR CUFF REPAIR SHOULDER OPEN, POSSIBLE GRAFT AND ANCHORS;  Surgeon: Jacki Cones, MD;  Location: [facility];  Service: Orthopedics;  Laterality: Left;   TOTAL ABDOMINAL HYSTERECTOMY W/ BILATERAL SALPINGOOPHORECTOMY  1990's   TOTAL KNEE ARTHROPLASTY Right 09/05/2019   Procedure: RIGHT TOTAL KNEE ARTHROPLASTY;  Surgeon: Marcene Corning, MD;  Location: [facility];  Service: Orthopedics;  Laterality: Right;   Patient Active Problem List   Diagnosis Date Noted   Pre-op evaluation 06/10/2022   Greater trochanteric bursitis, left 05/20/2022   Myalgia 03/11/2022   Overactive bladder 03/11/2022   Right shoulder pain 01/28/2022   Degenerative arthritis of left knee 11/18/2021   Radial head fracture 11/18/2021   Breast pain, left 08/05/2021    Headache 04/15/2021   Acute sinusitis 12/06/2020   Atypical chest pain 09/06/2020   Primary osteoarthritis of right knee 09/05/2019   DDD (degenerative disc disease), lumbar 06/28/2018   Rash 03/18/2018   Venous insufficiency 10/21/2016   Routine general medical examination at a health care facility 02/27/2016   Asthma, mild intermittent 07/19/2015   Essential hypertension 09/28/2010   RENAL CALCULUS 08/08/2010   Hyperlipidemia 02/25/2009   Constipation 10/04/2008   Low back pain 10/04/2008   Chronic allergic rhinitis 09/21/2008   Obesity 06/30/2007   ACID REFLUX DISEASE 06/30/2007    PCP: Myrlene Broker, MD  REFERRING PROVIDER: Myrlene Broker, MD  THERAPY DIAG:  Difficulty in walking, not elsewhere classified  Pain in left hip  Other low back pain  Rationale for Evaluation and Treatment Rehabilitation  SUBJECTIVE:  SUBJECTIVE STATEMENT: Pt presents for wheelchair evaluation with daughter Margarette Canada. Patiet reports PMH of osteoarthritis, low back pain, L rotator cuff repair, degenerative disc disease, and significant L hip pain. Patient currently using [facility] for mobility but reports about one near fall a day over the last 6 months. Patient is looking for a way to more safely move around home while also managing her severe pain.   PRECAUTIONS: Fall  RED FLAGS: None   WEIGHT BEARING RESTRICTIONS No    OCCUPATION: Retired  PLOF:   Requires some assistance with ADLs from ADLs  PATIENT GOALS: "Have a way to get around easier."     MEDICAL HISTORY:  Primary diagnosis onset: 10/13/2022 (referral date)    Medical Diagnosis with ICD-10 code: M51.36 (ICD-10-CM) - DDD (degenerative disc disease), lumbar M17.12 (ICD-10-CM) - Primary osteoarthritis of left knee    [] Progressive disease  Relevant future surgeries:     Height: 5'1' Weight: 180 lbs Explain recent changes or trends in weight:  Denies major fluctuations     History:  Past Medical History:  Diagnosis Date   Arthritis    Cataract    removed both eyes    Episodic tension type headache    Esophageal reflux    GERD (gastroesophageal reflux disease)    Glaucoma    Heart murmur    as young adult   Hyperlipidemia    Hypertension    Internal hemorrhoids    Kidney stone on left side Hx-date unknown   Left rotator cuff tear        Cardio Status:  Functional Limitations:   [x] Intact  []  Impaired      Respiratory Status:  Functional Limitations:   [x] Intact  [] Impaired   [] SOB [] COPD [] O2 Dependent ______LPM  [] Ventilator Dependent  Resp equip:                                                     Objective Measure(s):   Orthotics:   [] Amputee:                                                             [] Prosthesis:        HOME ENVIRONMENT:  [] House [] Condo/town home [x] Apartment [] Asst living [] LTCF         [] Own  [x] Rent   [x] Lives alone [] Lives with others -                             Hours without assistance: 8+  [x] Home is accessible to patient:   Level entrance no stairs; hardwood floors                             Storage of wheelchair:  [x] In home   [] Other Comments:        COMMUNITY :  TRANSPORTATION:  [x] Car [] Games developer [] Adapted w/c Lift []  Ambulance [] Other:                     [] Sits in wheelchair during transport   Where is w/c  stored during transport? In trunk [x] Tie Downs  []  EZ Lock  r   [] Self-Driver       Drive while in  Biomedical scientist [] yes [x] no   Employment and/or school:  Specific requirements pertaining to mobility    Patient is wanting to use the wheelchair when she gets up in the morning and is more unstable, is wanting a device to go to medical appointments and out with family for longer distances, is unable to currently go get her  mail or take out her trash given mobility impairments     Other:  COMMUNICATION:  Verbal Communication  [x] WFL [] receptive [] WFL [] expressive [] Understandable  [] Difficult to understand  [] non-communicative  Primary Language:__English____________ 2nd:_____________  Communication provided by:[x] Patient [x] Family [] Caregiver [] Translator   [] Uses an augmentative communication device     Manufacturer/Model :                                                                MOBILITY/BALANCE:  Sitting Balance  Standing Balance  Transfers  Ambulation   [x] WFL      [] WFL  [] Independent  []  Independent   [] Uses UE for balance in sitting Comments:  [x] Uses UE/device for stability Comments: SPC but is unsteady and limited by pain [x]  Min assist - requires use of SPC, patient unsteady with transfers []  Ambulates independently with       device:___________________      []  Mod assist  []  Able to ambulate ______ feet        safely/functionally/independently   []  Min assist  []  Min assist  []  Max assist  [x]  Non-functional ambulator         History/High risk of falls   []  Mod assist  []  Mod assist  []  Dependent  []  Unable to ambulate   []  Max  assist  []  Max assist  Transfer method:[] 1 person [] 2 person [] sliding board [] squat pivot [x] stand pivot [] mechanical patient lift  [] other:   []  Unable  []  Unable    Fall History: # of falls in the past 6 months? No # of "near" falls in the past 6 months? About one near fall a day when getting up in morning per patient and daughter, also one major near fall last Sunday at church    CURRENT SEATING / MOBILITY:  Current Mobility Device: [] None [x] Cane/Walker [] Manual [] Dependent [] Dependent w/ Tilt rScooter  [] Power (type of control):   Manufacturer: Unknown Model:  Serial #:   Size:  Color:  Age:   Purchased by whom:   Current condition of mobility base:    Current seating system:                                                                       Age of  seating system:    Describe posture in present seating system:    Is the current mobility meeting medical necessity?:  [] Yes [x] No Describe:        Ability to complete Mobility-Related Activities of Daily Living (MRADL's) with Current Mobility Device:  Move room to room  [] Independent  [x] Min [] Mod [] Max assist  [] Unable  Comments: Requires raised toilet seat to help make  it easier on toilet, uses SPC and 2WW for transfers but is a high falls risk and reports multiple near falls; patient reports pain greatly reduces speed and safety  Meal prep  [] Independent  [x] Min [] Mod [] Max assist  [] Unable    Feeding  [x] Independent  [] Min [] Mod [] Max assist  [] Unable    Bathing  [x] Independent  [] Min [] Mod [] Max assist  [] Unable    Grooming  [x] Independent  [] Min [] Mod [] Max assist  [] Unable    UE dressing  [x] Independent  [] Min [] Mod [] Max assist  [] Unable    LE dressing  [x] Independent   [] Min [] Mod [] Max assist  [] Unable    Toileting  [] Independent  [x] Min [] Mod [] Max assist  [] Unable    Bowel Mgt: [x]  Continent []  Incontinent []  Accidents []  Diapers []  Colostomy []  Bowel Program:  Bladder Mgt: [x]  Continent []  Incontinent []  Accidents []  Diapers []  Urinal []  Intermittent Cath []  Indwelling Cath []  Supra-pubic Cath     Current Mobility Equipment Trialed/ Ruled Out:    Does not meet mobility needs due to:    Mark all boxes that indicate inability to use the specific equipment listed     Meets needs for safe  independent functional  ambulation  / mobility    Risk of  Falling or History of Falls    Enviromental limitations      Cognition    Safety concerns with  physical ability    Decreased / limitations endurance  & strength     Decreased / limitations  motor skills  & coordination    Pain    Pace /  Speed    Cardiac and/or  respiratory condition    Contra - indicated by diagnosis   Cane/Crutches  []   [x]   []   []   [x]   [x]   [x]   [x]   [x]   []   []    Walker / Rollator  []  NA   []    [x]   []   []   [x]   [x]   [x]   [x]   [x]   []   []     Manual Wheelchair [id number]:  []  NA  []   []   []   []   [x]   []   []   [x]   [x]   []   []    Manual W/C (K0005) with power assist  []  NA  []   []   []   []   [x]   []   []   [x]   [x]   []   []    Scooter  []  NA  []   []   [x]   []   [x]   []   []   [x]   []   []   []    Power Wheelchair: standard joystick  []  NA  [x]   []   []   []   []   []   []   []   []   []   []    Power Wheelchair: alternative controls  [x]  NA  []   []   []   []   []   []   []   []   []   []   []    Summary:  The least costly alternative for independent functional mobility was found to be:    []  Crutch/Cane  []  Walker []  Manual w/c  []  Manual w/c with power assist   []  Scooter   [x]  Power w/c std joystick   []  Power w/c alternative control        []  Requires dependent care mobility device   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[employer]  Mobility  Processing skills are adequate for safe mobility equipment operation  [x]   Yes []   No  Patient is willing and motivated to use recommended mobility equipment  [x]   Yes []   No       []  Patient is unable to safely operate mobility equipment independently and requires dependent care equipment Comments:           SENSATION and SKIN ISSUES:  Sensation []  Intact  [x]  Impaired []  Absent []  Hyposensate []  Hypersensate  []  Defensiveness  Location(s) of impairment: L leg down from back and below knee on R lower extremity   Pressure Relief Method(s):  [x]  Lean side to side to offload (without risk of falling)  []   W/C push up (4+ times/hour for 15+ seconds) []  Stand up (without risk of falling)    []  Other: (Describe): Effective pressure relief method(s) above can be performed consistently throughout the day: [] Yes  []  No If not, Why?:  Skin Integrity Risk:       []  Low risk           [x]  Moderate risk            []  High risk  If high risk, explain: Decreased sensation along Left lower extremity places at increased risk for pressure sores and breakedown  Skin Issues/Skin Integrity   Current skin Issues  []  Yes [x]  No [x]  Intact  []   Red area   []   Open area  []  Scar tissue  []  At risk from prolonged sitting  Where: History of Skin Issues  []  Yes [x]  No Where : When: Stage: Hx of skin flap surgeries  []  Yes [x]  No Where:  When:  Pain: [x]  Yes []  No   Pain Location(s): both hands cramp and lock up, low back, and L leg and hip Intensity scale: (0-10): 10/10 How does pain interfere with mobility and/or MRADLs? - increases falls risk particularly when getting up in the morning and in pain     MAT EVALUATION:  Neuro-Muscular Status: (Tone, Reflexive, Responses, etc.)     [x]   Intact   []  Spasticity:  []  Hypotonicity  []  Fluctuating  []  Muscle Spasms  []  Poor Righting Reactions/Poor Equilibrium Reactions  []  Primal Reflex(s):    Comments:   Grossly WFL however, notable guarding of LLE; would not allow therapist to move through full ROM due to pain, Sits with legs swept to the right with hip internally rotated and knee extended         COMMENTS:    POSTURE:     Comments:  Pelvis Anterior/Posterior:  []  Neutral   [x]  Posterior  []  Anterior  []  Fixed - No movement [x]  Tendency away from neutral [x]  Flexible [x]  Self-correction []  External correction Obliquity (viewed from front)  []  WFL []  R Obliquity [x]  L Obliquity  []  Fixed - No movement [x]  Tendency away from neutral [x]  Flexible [x]  Self-correction []  External correction Rotation  [x]  WFL []  R anterior []  L anterior  []  Fixed - No movement []  Tendency away from neutral []  Flexible []  Self-correction []  External correction Tonal Influence Pelvis:  [x]  Normal []  Flaccid []  Low tone []  Spasticity []  Dystonia []  Pelvis thrust []  Other:    Trunk Anterior/Posterior:  []  WFL [x]  Thoracic kyphosis []  Lumbar lordosis  []  Fixed - No movement [x]  Tendency away from neutral []  Flexible []  Self-correction []  External correction  [x]  WFL []  Convex to left  []  Convex to  right []  S-curve   []   C-curve []  Multiple curves []  Tendency away from neutral []  Flexible []  Self-correction []  External correction Rotation of shoulders and upper trunk:  []  Neutral [x]  Left-anterior [x]  Right- anterior []  Fixed- no movement [x]  Tendency away from neutral [x]  Flexible [x]  Self correction []  External correction Tonal influence Trunk:  [x]  Normal []  Flaccid []  Low tone []  Spasticity []  Dystonia []  Other:   Head & Neck  [x]  Functional []  Flexed    []  Extended []  Rotated right  []  Rotated left []  Laterally flexed right []  Laterally flexed left []  Cervical hyperextension   [x]  Good head control []  Adequate head control []  Limited head control []  Absent head control Describe tone/movement of head and neck: WFL     Lower Extremity Measurements: LE ROM:  Active ROM Right 01/05/2023 Left 01/05/2023  Hip flexion WFL 100 degrees, would not allow therapist or self to move farther due to pain  Hip extension    Hip abduction    Hip adduction    Knee flexion WFL 60 degrees limited due to pain, would not allow therapist or self to move farther  Knee extension [facility]  Ankle dorsiflexion    Ankle plantarflexion     (Blank rows = not tested)  LE MMT:  MMT Right 01/05/2023 Left 01/05/2023  Hip flexion 4-/5 Unable to assess due to pain  Hip extension    Hip abduction    Hip adduction    Knee flexion 4-/5 Unable to assess due to pain  Knee extension 4-/5 Unable to assess due to pain  Ankle dorsiflexion 4-/5 Unable to assess due to pain  Ankle plantarflexion     (Blank rows = not tested)  Hip positions:  []  Neutral   []  Abducted   [x]  Adducted  []  Subluxed   []  Dislocated   []  Fixed   []  Tendency away from neutral [x]  Flexible [x]  Self-correction []  External correction   Hip Windswept:[]  Neutral  [x]  Right    []  Left  []  Subluxed   []  Dislocated   []  Fixed   []  Tendency away from neutral [x]  Flexible [x]  Self-correction []  External  correction   L leg extended    Foot positioning: ROM Concerns: Dorsiflexed: []  Right   []  Left Plantar flexed: []  Right    [x]  Left Inversion: []  Right    [x]  Left Eversion: []  Right    []  Left  LE Edema: [x]  1+ (Barely detectable impression when finger is pressed into skin) []  2+ (slight indentation. 15 seconds to rebound) []  3+ (deeper indentation. 30 seconds to rebound) []  4+ (>30 seconds to rebound)  UE Measurements:  UPPER EXTREMITY ROM:   Grossly WFL - limited by L rotator cuff injury and intermittent cramping in bilateral hands  UPPER EXTREMITY MMT:  Grossly WFL  Shoulder Posture:  Right Tendency towards Left  []   Functional []    []   Elevation []    []   Depression []    [x]   Protraction [x]    []   Retraction []    [x]   Internal rotation [x]    []   External rotation []    []   Subluxed []     UE Tone: [x]  Normal []  Flaccid []  Low tone []  Spasticity  []  Dystonia []  Other:   UE Edema: Abscent  Wrist/Hand: Handedness: [x]  Right   []  Left   []  NA: Comments:  Right  Left  [x]   WNL [x]    []   Limitations []    []   Contractures []    []   Fisting []    []   Tremors []    []   Weak grasp []    []   Poor dexterity []    []   Hand movement non functional []    []   Paralysis []      GAIT SPEED DURING 10 METER WALKING TEST: 0.4 m/s with AD (SBA) TUG: 29.8 seconds with SPC (SBA)    MOBILITY BASE RECOMMENDATIONS and JUSTIFICATION:  MOBILITY BASE  JUSTIFICATION   Manufacturer:   Games developer:   [employer]                           Color: Black Seat Width: 18" Seat Depth: 17"   []  Manual mobility base (continue below)   []  Scooter/POV  [x]  Power mobility base   Number of hours per day spent in above selected mobility base: 4 hours day  Typical daily mobility base use Schedule: Used throughout house particularly for transfers around the house   [x]  is not a safe, functional ambulator  [x]  limitation prevents from completing a MRADL(s) within a reasonable time frame     [x]  limitation places at high risk of morbidity or mortality secondary to  the attempts to perform a    MRADL(s)  [x]  limitation prevents accomplishing a MRADL(s) entirely  [x]  provide independent mobility  [x]  equipment is a lifetime medical need  [x]  walker or cane inadequate  [x]  any type manual wheelchair      inadequate  [x]  scooter/POV inadequate      []  requires dependent mobility           POWER MOBILITY      []  Scooter/POV    []  can safely operate   []  can safely transfer   []  has adequate trunk stability   []  cannot functionally propel  manual wheelchair    [x]  Power mobility base    [x]  non-ambulatory   [x]  cannot functionally propel manual wheelchair   [x]  cannot functionally and safely      operate scooter/POV  [x]  can safely operate power       wheelchair  [x]  home is accessible  [x]  willing to use power wheelchair     Tilt  []  Powered tilt on powered chair  []  Powered tilt on manual chair  []  Manual tilt on manual chair Comments:  []  change position for pressure      []  elief/cannot weight shift   []  change position against      gravitational force on head and      shoulders   []  decrease pain  []  blood pressure management   []  control autonomic dysreflexia  []  decrease respiratory distress  []  management of spasticity  []  management of low tone  []  facilitate postural control   []  rest periods   []  control edema  []  increase sitting tolerance   []  aid with transfers     Recline   []  Power recline on power chair  []  Manual recline on manual chair  Comments:    []  intermittent catheterization  []  manage spasticity  []  accommodate femur to back angle  []  change position for pressure relief/cannot weight shift rhigh risk of pressure sore development  []  tilt alone does not accomplish     effective pressure relief, maximum pressure relief achieved at -      _______ degrees tilt   _______ degrees recline   []  difficult to transfer to and from  bed []  rest periods and sleeping in chair  []  repositioning for transfers  []   bring to full recline for ADL care  []  clothing/diaper changes in chair  []  gravity PEG tube feeding  []  head positioning  []  decrease pain  []  blood pressure management   []  control autonomic dysreflexia  []  decrease respiratory distress  []  user on ventilator     Elevator on mobility base  []  Power wheelchair  []  Scooter  []  increase Indep in transfers   []  increase Indep in ADLs    []  bathroom function and safety  []  kitchen/cooking function and safety  []  shopping  []  raise height for communication at standing level  []  raise height for eye contact which reduces cervical neck strain and pain  []  drive at raised height for safety and navigating crowds  []  Other:   []  Vertical position system  (anterior tilt)     (Drive locks-out)    []  Stand       (Drive enabled)  []  independent weight bearing  []  decrease joint contractures  []  decrease/manage spasticity  []  decrease/manage spasms  []  pressure distribution away from   scapula, sacrum, coccyx, and ischial tuberosity  []  increase digestion and elimination   []  access to counters and cabinets  []  increase reach  []  increase interaction with others at eye level, reduces neck strain  []  increase performance of       MRADL(s)      Power elevating legrest    []  Center mount (Single) 85-170 degrees       []  Standard (Pair) 100-170 degrees  []  position legs at 90 degrees, not available with std power ELR  []  center mount tucks into chair to decrease turning radius in home, not available with std power ELR  []  provide change in position for LE  []  elevate legs during recline    []  maintain placement of feet on      footplate  []  decrease edema  []  improve circulation  []  actuator needed to elevate legrest  []  actuator needed to articulate legrest preventing knees from flexing  []  Increase ground clearance over      curbs  []   STD (pair)  independently                     elevate legrest   POWER WHEELCHAIR CONTROLS      Controls/input device  []  Expandable  []  Non-expandable  []  Proportional  []  Right Hand []  Left Hand  []  Non-proportional/switches/head-array  []  Electrical/proximity         []   Mechanical      Manufacturer:___________________   Type:________________________ []  provides access for controlling wheelchair  []  programming for accurate control  []  progressive disease/changing condition  []  required for alternative drive      controls       []  lacks motor control to operate  proportional drive control  []  unable to understand proportional controls  []  limited movement/strength  []  extraneous movement / tremors / ataxic / spastic       []  Upgraded electronics controller/harness    []  Single power (tilt or recline)   []  Expandable    []  Non-expandable plus   []  Multi-power (tilt, recline, power legrest, power seat lift, vertical positioning system, stand)  []  allows input device to communicate with drive motors  []  harness provides necessary connections between the controller, input device, and seat functions     []  needed in order to operate power seat functions through joystick/ input device  []  required for alternative drive controls     []   Enhanced display  []  required to connect all alternative drive controls   []  required for upgraded joystick      (lite-throw, heavy duty, micro)  []  Allows user to see in which mode and drive the wheelchair is set; necessary for alternate controls       []  Upgraded tracking electronics  []  correct tracking when on uneven surfaces makes switch driving more efficient and less fatiguing  []  increase safety when driving  []  increase ability to traverse thresholds    []  Safety / reset / mode switches     Type:    []  Used to change modes and stop the wheelchair when driving     [x]  Mount for joystick / input device/switches  [x]  swing away for access or transfers    []  attaches joystick / input device / switches to wheelchair   []  provides for consistent access  []  midline for optimal placement    []  Attendant controlled joystick plus     mount  []  safety  []  long distance driving  []  operation of seat functions  []  compliance with transportation regulations    [x]  Battery  [x]  required to power (power assist / scooter/ power wc / other):   []  Power inverter (24V to 12V)  []  required for ventilator / respiratory equipment / other:     CHAIR OPTIONS MANUAL & POWER      Armrests   [x]  adjustable height []  removable  []  swing away []  fixed  [x]  flip back  []  reclining  [x]  full length pads []  desk []  tube arms []  gel pads  [x]  provide support with elbow at 90    [x]  remove/flip back/swing away for transfers  [x]  provide support and positioning of upper body    [x]  allow to come closer to table top  [x]  remove for access to tables  []  provide support for w/c tray  []  change of height/angles for variable activities   []  Elbow support / Elbow stop  []  keep elbow positioned on arm pad  []  keep arms from falling off arm pad  during tilt and/or recline   Upper Extremity Support  []  Arm trough  []   R  []   L  Style:  []  swivel mount []  fixed mount   []  posterior hand support  []   tray  []  full tray  []  joystick cut out  []   R  []   L  Style:  []  decrease gravitational pull on      shoulders  []  provide support to increase UE  function  []  provide hand support in natural    position  []  position flaccid UE  []  decrease subluxation    []  decrease edema       []  manage spasticity   []  provide midline positioning  []  provide work surface  []  placement for AAC/ Computer/ EADL       Hangers/ Legrests   []  ______ degree  [x]  Elevating []  articulating  [x]  swing away []  fixed []  lift off  []  heavy duty  []  adjustable knee angle  []  adjustable calf panel   []  longer extension tube              [x]  provide LE support  [x]  maintain placement of  feet on      footplate   []  accommodate lower leg length  []  accommodate to hamstring       tightness  [x]  enable transfers  []  provide change in  position for LE's  [x]  elevate legs during recline    [x]  decrease edema  []  durability      Foot support   [x]  footplate [x]  R [x]  L [x]  flip up           []  Depth adjustable   []  angle adjustable  []  foot board/one piece    [x]  provide foot support  [x]  accommodate to ankle ROM  [x]  allow foot to go under wheelchair base  [x]  enable transfers     []  Shoe holders  []  position foot    []  decrease / manage spasticity  []  control position of LE  []  stability    []  safety     []  Ankle strap/heel      loops  []  support foot on foot support  []  decrease extraneous movement  []  provide input to heel   []  protect foot     []  Amputee adapter []  R  []  L     Style:                  Size:  []  Provide support for stump/residual extremity    []  Transportation tie-down  []  to provide crash tested tie-down brackets    []  Crutch/cane holder    []  O2 holder    []  IV hanger   []  Ventilator tray/mount    []  stabilize accessory on wheelchair       Component  Justification     []  Seat cushion - Captain     []  accommodate impaired sensation  []  decubitus ulcers present or history  []  unable to shift weight  []  increase pressure distribution  []  prevent pelvic extension  []  custom required "off-the-shelf"    seat cushion will not accommodate deformity  []  stabilize/promote pelvis alignment  []  stabilize/promote femur alignment  []  accommodate obliquity  []  accommodate multiple deformity  []  incontinent/accidents  []  low maintenance     []  seat mounts                 []  fixed []  removable  []  attach seat platform/cushion to wheelchair frame    []  Seat wedge    []  provide increased aggressiveness of seat shape to decrease sliding  down in the seat  []  accommodate ROM        []  Cover replacement   []  protect back or seat cushion  []   incontinent/accidents    []  Solid seat / insert    []  support cushion to prevent      hammocking  []  allows attachment of cushion to mobility base    []  Lateral pelvic/thigh/hip     support (Guides)     []  decrease abduction  []  accommodate pelvis  []  position upper legs  []  accommodate spasticity  []  removable for transfers     []  Lateral pelvic/thigh      supports mounts  []  fixed   []  swing-away   []  removable  []  mounts lateral pelvic/thigh supports     []  mounts lateral pelvic/thigh supports swing-away or removable for transfers    []  Medial thigh support (Pommel)  [] decrease adduction  [] accommodate ROM  []  remove for transfers   []  alignment      []  Medial thigh   []  fixed      support mounts      []  swing-away   []  removable  []  mounts medial thigh supports   []  Mounts medial supports swing- away or removable for transfers  Component  Justification   []  Back       []  provide posterior trunk support []  facilitate tone  []  provide lumbar/sacral support []  accommodate deformity  []  support trunk in midline   []  custom required "off-the-shelf" back support will not accommodate deformity   []  provide lateral trunk support []  accommodate or decrease tone            []  Back mounts  []  fixed  []  removable  []  attach back rest/cushion to wheelchair frame   []  Lateral trunk      supports  []  R []  L  []  decrease lateral trunk leaning  []  accommodate asymmetry    []  contour for increased contact  []  safety    []  control of tone    []  Lateral trunk      supports mounts  []  fixed  []  swing-away   []  removable  []  mounts lateral trunk supports     []  Mounts lateral trunk supports swing-away or removable for transfers   []  Anterior chest      strap, vest     []  decrease forward movement of shoulder  []  decrease forward movement of trunk  []  safety/stability  []  added abdominal support  []  trunk alignment  []  assistance with shoulder control   []  decrease shoulder elevation     []  Headrest      []  provide posterior head support  []  provide posterior neck support  []  provide lateral head support  []  provide anterior head support  []  support during tilt and recline  []  improve feeding     []  improve respiration  []  placement of switches  []  safety    []  accommodate ROM   []  accommodate tone  []  improve visual orientation   []  Headrest           []  fixed []  removable []  flip down      Mounting hardware   []  swing-away laterals/switches  []  mount headrest   []  mounts headrest flip down or  removable for transfers  []  mount headrest swing-away laterals   []  mount switches     []  Neck Support    []  decrease neck rotation  []  decrease forward neck flexion   Pelvic Positioner    []  std hip belt          []  padded hip belt  []  dual pull hip belt  []  four point hip belt  []  stabilize tone  []  decrease falling out of chair  []  prevent excessive extension  []  special pull angle to control      rotation  []  pad for protection over boney   prominence  []  promote comfort    []  Essential needs        bag/pouch   []  medicines []  special food rorthotics []  clothing changes  []  diapers  []  catheter/hygiene []  ostomy supplies   The above equipment has a life- long use expectancy.  Growth and changes in medical and/or functional conditions would be the exceptions.   SUMMARY:  Why mobility device was selected; include why a lower level device is not appropriate:   ASSESSMENT:  CLINICAL IMPRESSION: Patient is a 82 y.o. female who was seen today for physical therapy evaluation and treatment for PMH of osteoarthritis, low back pain, L rotator cuff repair, degenerative disc disease, and significant L hip pain. Patient is currently using a mix of SPC and walker to get around house; however, patient reporting multiple near falls. Patient  also at a high risk of falls as indicated by TUG speed of  29.8 seconds with SPC and gait speed of 0.4 m/s also indicating falls risk and  risk for future hospitalizations. Cane and walker not appropriate to accommodate falls risk. A scooter is not appropriate to accommodate patient postural needs as indicated by lack of knee flexion due to pain as well as will not meet patient's household mobility needs. Rotator cuff injury rules out appropriateness of manual wheelchair. For these reasons, the [facility] Go Chair Med is a medical necessity for the patient as it accomodates mobility needs, decreased falls risk, and will help minimize pain. Patient will also benefit from leg rest elevators to accommodate reduced LLE ROM knees given patient's severity of pain and address LE edema as well. The [facility] Go Chair Med also breaks down into component parts making it more easy to transport into the community: a feature that patient's daughter and patient verbalize is one they are looking for as they are hoping to store the chair in their trunk for community access. In short, power wheelchair is medical necessity to maximize patient's independence and function.   OBJECTIVE IMPAIRMENTS decreased balance, decreased mobility, difficulty walking, decreased ROM, decreased strength, impaired sensation, impaired UE functional use, and pain.   ACTIVITY LIMITATIONS carrying, stairs, transfers, and locomotion level  PARTICIPATION LIMITATIONS: meal prep, cleaning, laundry, community activity, and yard work  PERSONAL FACTORS Age and 3+ comorbidities: see above  are also affecting patient's functional outcome.    CLINICAL DECISION MAKING: Stable/uncomplicated  EVALUATION COMPLEXITY: Low                                   GOALS: One time visit. No goals established.  PLAN: PT FREQUENCY: one time visit  Carmelia Bake, PT, DPT 01/05/2023, 3:05 PM    I concur with the above findings and recommendations of the therapist:  Physician name printed:         Physician's signature:      Date:

## 2023-01-07 NOTE — Addendum Note (Signed)
Addended by: Maryruth Eve A on: 01/07/2023 05:26 PM   Modules accepted: Orders

## 2023-01-11 ENCOUNTER — Ambulatory Visit: Payer: Medicare PPO | Attending: Internal Medicine | Admitting: Physical Therapy

## 2023-01-11 ENCOUNTER — Encounter: Payer: Self-pay | Admitting: Physical Therapy

## 2023-01-11 DIAGNOSIS — M5459 Other low back pain: Secondary | ICD-10-CM | POA: Insufficient documentation

## 2023-01-11 DIAGNOSIS — M25552 Pain in left hip: Secondary | ICD-10-CM | POA: Insufficient documentation

## 2023-01-11 NOTE — Therapy (Signed)
OUTPATIENT PHYSICAL THERAPY THORACOLUMBAR TREATMENT NOTE   Patient Name: Catherine Hoffman MRN: [id number] DOB:[birth year], 82 y.o., female Today's Date: 01/11/2023  END OF SESSION:  PT End of Session - 01/11/23 1158     Visit Number 4    Number of Visits 5    Date for PT Re-Evaluation 01/08/23    Authorization Type [employer]    Authorization Time Period 12-03-22 - 02-02-23    Authorization - Visit Number 4    Authorization - Number of Visits 6    PT Start Time 0847    PT Stop Time 0931    PT Time Calculation (min) 44 min    Activity Tolerance Patient tolerated treatment well    Behavior During Therapy [facility] for tasks assessed/performed                Past Medical History:  Diagnosis Date   Arthritis    Cataract    removed both eyes    Episodic tension type headache    Esophageal reflux    GERD (gastroesophageal reflux disease)    Glaucoma    Heart murmur    as young adult   Hyperlipidemia    Hypertension    Internal hemorrhoids    Kidney stone on left side Hx-date unknown   Left rotator cuff tear    Past Surgical History:  Procedure Laterality Date   COLONOSCOPY  11/20/2008   COLONOSCOPY     KNEE ARTHROSCOPY Left 2004   SHOULDER OPEN ROTATOR CUFF REPAIR Left 08/30/2012   Procedure: ROTATOR CUFF REPAIR SHOULDER OPEN, POSSIBLE GRAFT AND ANCHORS;  Surgeon: Jacki Cones, MD;  Location: [facility];  Service: Orthopedics;  Laterality: Left;   TOTAL ABDOMINAL HYSTERECTOMY W/ BILATERAL SALPINGOOPHORECTOMY  1990's   TOTAL KNEE ARTHROPLASTY Right 09/05/2019   Procedure: RIGHT TOTAL KNEE ARTHROPLASTY;  Surgeon: Marcene Corning, MD;  Location: [facility];  Service: Orthopedics;  Laterality: Right;   Patient Active Problem List   Diagnosis Date Noted   Pre-op evaluation 06/10/2022   Greater trochanteric bursitis, left 05/20/2022   Myalgia 03/11/2022   Overactive bladder 03/11/2022   Right shoulder pain 01/28/2022   Degenerative arthritis of left knee  11/18/2021   Radial head fracture 11/18/2021   Breast pain, left 08/05/2021   Headache 04/15/2021   Acute sinusitis 12/06/2020   Atypical chest pain 09/06/2020   Primary osteoarthritis of right knee 09/05/2019   DDD (degenerative disc disease), lumbar 06/28/2018   Rash 03/18/2018   Venous insufficiency 10/21/2016   Routine general medical examination at a health care facility 02/27/2016   Asthma, mild intermittent 07/19/2015   Essential hypertension 09/28/2010   RENAL CALCULUS 08/08/2010   Hyperlipidemia 02/25/2009   Constipation 10/04/2008   Low back pain 10/04/2008   Chronic allergic rhinitis 09/21/2008   Obesity 06/30/2007   ACID REFLUX DISEASE 06/30/2007    PCP: Myrlene Broker., MD REFERRING PROVIDER: Rodolph Bong, MD  REFERRING DIAG: [phone] (ICD-10-CM) - Left hip pain  Rationale for Evaluation and Treatment: Rehabilitation  THERAPY DIAG:  Pain in left hip  Other low back pain  ONSET DATE: Referral date 11-19-22   SUBJECTIVE:  SUBJECTIVE STATEMENT: Pt reports she hasn't gotten MRI results yet; states her low back is not really painful today, just sore, but her Lt lateral lower leg is still painful - says it radiates down Lt lateral lower leg and feels pins and needles in Lt foot; pt able to flex forward today - "I don't have to lean back".  Forgot to take her cane with her to church yesterday but did OK without it   PERTINENT HISTORY:  lumbar radiculopathy w/ L-side LE weakness; Lt hip trochanteric bursitis - pt reports she received cortisone injection but says it did not help;  lumbar DDD, h/o Rt TKA March 2021  PAIN:  Are you having pain? Yes: NPRS scale: 10/10 Pain location: Lt lateral lower - Lt hip and lower back is not as sore Pain description: tight, grimacing, pins and  needles in the left lower legs Aggravating factors: sitting on Lt hip makes it worse - cloudy weather makes it worse Relieving factors: Tylenol   PRECAUTIONS: Fall  WEIGHT BEARING RESTRICTIONS: No  FALLS:  Has patient fallen in last 6 months? No  LIVING ENVIRONMENT: Lives with: lives alone Lives in: House/apartment Stairs: No Has following equipment at home: Single point cane and Environmental consultant - 2 wheeled  OCCUPATION: retired  PLOF: Independent with basic ADLs and does not drive - hasn't driven in about a year;  PATIENT GOALS: to try to reduce pain in Lt hip and low back  NEXT MD VISIT: no appt with Dr. Denyse Amass scheduled as of current time:  MRI scheduled 01-03-23 for Lt hip and low back  OBJECTIVE:   DIAGNOSTIC FINDINGS:  MRI scheduled 01-03-23  GAIT: Distance walked: 40' Assistive device utilized: Single point cane Level of assistance: Modified independence Comments: antalgic gait with decreased weight bearing on LLE  TODAY'S TREATMENT:                                                                                                                              DATE:  01-11-23  TherEx: Standing Lt and Rt hamstring/gastroc stretch - Runner's stretch - 15 sec hold x 1 rep Seated bil. hamstring stretch with pt's Lt & Rt foot placed on stool - flexed foot for more intense stretch  - 15 sec hold x 1 rep Standing bil. Heel cord stretch with heels off edge of step - 1 rep 15 sec hold  SAQ's LLE - 10 reps with 3 sec hold - no resistance used Lt hip flexion seated 10 reps  Supine position: Pelvic tilt 5 reps 3 sec hold Hip flexion in hooklying position 10 reps LLE heel slides 5 reps 2 sets Lt hip abdct. In hooklying position 10 reps  In Rt sidelying position - Lt clam shell 10 reps Isometric glut squeezes 5 reps - as in preparation for bridge exercise    Modified HEP to include exercises in non-weightbearing position for increased ease and comfort of Lt hip Medbridge Access  Code: [email or web address]  URL: [email or web address] Date: 01/04/2023 Prepared by: Maebelle Munroe  Exercises - new HEP - 01-04-23 - Supine Posterior Pelvic Tilt  - 1 x daily - 7 x weekly - 1 sets - 10 reps - 3 sec hold - Supine Lower Trunk Rotation  - 1 x daily - 7 x weekly - 1 sets - 4 reps - 15 sec hold - Supine Heel Slide  - 1 x daily - 7 x weekly - 1 sets - 10 reps - Seated Long Arc Quad  - 1 x daily - 7 x weekly - 1 sets - 10 reps - Bent Knee Fallouts  - 1 x daily - 7 x weekly - 1 sets - 10 reps - Supine March  - 1 x daily - 7 x weekly - 1 sets - 10 reps  Medbridge HEP: Access Code: [email or web address] URL: [email or web address] Date: 12/22/2022 Prepared by: Maebelle Munroe  Exercises - Seated Hamstring Stretch  - 1 x daily - 7 x weekly - 1 sets - 1-2 reps - 20-30 sec hold - Seated Hamstring Stretch with Chair  - 1 x daily - 7 x weekly - 1 sets - 1-2 reps - 20-30 sec hold - Beginner Clam  - 1 x daily - 7 x weekly - 1 sets - 10 reps - 3 sec hold hold - Standing Hip Abduction with Counter Support  - 1 x daily - 7 x weekly - 1 sets - 10 reps - Standing Hip Extension with Counter Support  - 1 x daily - 7 x weekly - 1 sets - 10 reps - Standing Hip Flexion AROM  - 1 x daily - 7 x weekly - 1 sets - 10 reps - Seated Hamstring Curl with Anchored Resistance  - 1 x daily - 7 x weekly - 1 sets - 10 reps   PATIENT EDUCATION:  Education details:  Luna Fuse Person educated: Patient Education method: Explanation Education comprehension: needs further education   HOME EXERCISE PROGRAM: Medbridge   ASSESSMENT:  CLINICAL IMPRESSION: PT session focused on stretching and strengthening exercises for LLE stretching and strengthening exercises.  Pt reported less low back and hip soreness today but did report discomfort in Lt lateral lower leg with pins and needles sensation in Lt foot with walking.  Pt able to perform clam shell in Rt sidelying position with minimal discomfort.  Pt  able to perform seated Lt hip flexion in seated position without increased pain.  Cont with POC.   OBJECTIVE IMPAIRMENTS: Abnormal gait, decreased balance, difficulty walking, decreased strength, and pain.   ACTIVITY LIMITATIONS: carrying, bending, sitting, standing, squatting, and locomotion level  PARTICIPATION LIMITATIONS: meal prep, cleaning, laundry, driving, shopping, community activity, and church  PERSONAL FACTORS: Age, Fitness, Past/current experiences, Time since onset of injury/illness/exacerbation, Transportation, and 1 comorbidity: Lt hip pain of unknown etiology  are also affecting patient's functional outcome.   REHAB POTENTIAL: Fair due to chronicity of pain and lack of benefit from previous cortisone injections  CLINICAL DECISION MAKING: Evolving/moderate complexity  EVALUATION COMPLEXITY: Moderate   GOALS: Goals reviewed with patient? Yes   LONG TERM GOALS: Target date: 01-01-23;  extend 01-11-23  Pt will be independent in HEP for LLE strengthening and ITB stretching. Baseline:  Goal status: Ongoing  2.  Pt will report decreased pain in Lt hip to <6/10 at rest. Baseline: 10/10 intensity on 12-04-22 & on 01-04-23 Goal status: Ongoing  3.  Trial of TENS unit for assist with pain management. Baseline:  Goal status: Deferred until MRI  results obtained - 01-04-23   PLAN:  PT FREQUENCY: 1x/week  PT DURATION: 4 weeks  PLANNED INTERVENTIONS: Therapeutic exercises, Therapeutic activity, Neuromuscular re-education, Balance training, Gait training, Patient/Family education, Self Care, Electrical stimulation, Moist heat, Ultrasound, Ionotophoresis 4mg /ml Dexamethasone, and Manual therapy.  PLAN FOR NEXT SESSION: MRI results? check new HEP issued on 01-04-23 for any questions and/or concerns; cont with Lt hip and low back strengthening; Tens trial?   Kary Kos, PT 01/11/2023, 12:03 PM

## 2023-01-12 ENCOUNTER — Telehealth: Payer: Self-pay | Admitting: Family Medicine

## 2023-01-12 DIAGNOSIS — M5416 Radiculopathy, lumbar region: Secondary | ICD-10-CM

## 2023-01-12 NOTE — Telephone Encounter (Signed)
Epidural steroid injection ordered 

## 2023-01-12 NOTE — Progress Notes (Signed)
Hip MRI does not explain your leg pain.  The lumbar spine MRI does that I have ordered an injection for that.

## 2023-01-12 NOTE — Progress Notes (Signed)
Lumbar spine MRI shows multiple areas where the nerve is being pinched in your back causing the pain in your legs and hips that I think you are experiencing.  Plan for epidural steroid injection.  This is an injection in your back that could help your leg pain.  You should hear soon from radiology about scheduling the injection.

## 2023-01-19 ENCOUNTER — Ambulatory Visit: Payer: Medicare PPO | Admitting: Physical Therapy

## 2023-01-19 DIAGNOSIS — M25552 Pain in left hip: Secondary | ICD-10-CM

## 2023-01-19 DIAGNOSIS — M5459 Other low back pain: Secondary | ICD-10-CM | POA: Diagnosis not present

## 2023-01-19 NOTE — Therapy (Unsigned)
OUTPATIENT PHYSICAL THERAPY THORACOLUMBAR TREATMENT NOTE   Patient Name: Catherine Hoffman MRN: [id number] DOB:March 21, 1941, 82 y.o., female Today's Date: 01/20/2023  END OF SESSION:  PT End of Session - 01/20/23 1904     Visit Number 5    Number of Visits 5    Date for PT Re-Evaluation 01/08/23    Authorization Type [employer]    Authorization Time Period 12-03-22 - 02-02-23    Authorization - Visit Number 5    Authorization - Number of Visits 6    PT Start Time 0801    PT Stop Time 0845    PT Time Calculation (min) 44 min    Activity Tolerance Patient tolerated treatment well;Patient limited by pain    Behavior During Therapy [facility] for tasks assessed/performed                 Past Medical History:  Diagnosis Date   Arthritis    Cataract    removed both eyes    Episodic tension type headache    Esophageal reflux    GERD (gastroesophageal reflux disease)    Glaucoma    Heart murmur    as young adult   Hyperlipidemia    Hypertension    Internal hemorrhoids    Kidney stone on left side Hx-date unknown   Left rotator cuff tear    Past Surgical History:  Procedure Laterality Date   COLONOSCOPY  11/20/2008   COLONOSCOPY     KNEE ARTHROSCOPY Left 2004   SHOULDER OPEN ROTATOR CUFF REPAIR Left 08/30/2012   Procedure: ROTATOR CUFF REPAIR SHOULDER OPEN, POSSIBLE GRAFT AND ANCHORS;  Surgeon: Jacki Cones, MD;  Location: [facility];  Service: Orthopedics;  Laterality: Left;   TOTAL ABDOMINAL HYSTERECTOMY W/ BILATERAL SALPINGOOPHORECTOMY  1990's   TOTAL KNEE ARTHROPLASTY Right 09/05/2019   Procedure: RIGHT TOTAL KNEE ARTHROPLASTY;  Surgeon: Marcene Corning, MD;  Location: [facility];  Service: Orthopedics;  Laterality: Right;   Patient Active Problem List   Diagnosis Date Noted   Pre-op evaluation 06/10/2022   Greater trochanteric bursitis, left 05/20/2022   Myalgia 03/11/2022   Overactive bladder 03/11/2022   Right shoulder pain 01/28/2022   Degenerative  arthritis of left knee 11/18/2021   Radial head fracture 11/18/2021   Breast pain, left 08/05/2021   Headache 04/15/2021   Acute sinusitis 12/06/2020   Atypical chest pain 09/06/2020   Primary osteoarthritis of right knee 09/05/2019   DDD (degenerative disc disease), lumbar 06/28/2018   Rash 03/18/2018   Venous insufficiency 10/21/2016   Routine general medical examination at a health care facility 02/27/2016   Asthma, mild intermittent 07/19/2015   Essential hypertension 09/28/2010   RENAL CALCULUS 08/08/2010   Hyperlipidemia 02/25/2009   Constipation 10/04/2008   Low back pain 10/04/2008   Chronic allergic rhinitis 09/21/2008   Obesity 06/30/2007   ACID REFLUX DISEASE 06/30/2007    PCP: Myrlene Broker., MD REFERRING PROVIDER: Rodolph Bong, MD  REFERRING DIAG: [phone] (ICD-10-CM) - Left hip pain  Rationale for Evaluation and Treatment: Rehabilitation  THERAPY DIAG:  Other low back pain  Pain in left hip  ONSET DATE: Referral date 11-19-22   SUBJECTIVE:  SUBJECTIVE STATEMENT: Pt reports she hasn't gotten MRI results yet - called yesterday but was told it sometimes takes a few weeks to get them - states she is going to call again today; pt reports her back is hurting today and not her hip so much  PERTINENT HISTORY:  lumbar radiculopathy w/ L-side LE weakness; Lt hip trochanteric bursitis - pt reports she received cortisone injection but says it did not help;  lumbar DDD, h/o Rt TKA March 2021  PAIN:  Are you having pain? Yes: NPRS scale: 9-10/10 Pain location: Lt lateral lower - Lt hip and lower back is not as sore Pain description: tight, grimacing, pins and needles in the left lower legs Aggravating factors: sitting on Lt hip makes it worse - cloudy weather makes it  worse Relieving factors: Tylenol   PRECAUTIONS: Fall  WEIGHT BEARING RESTRICTIONS: No  FALLS:  Has patient fallen in last 6 months? No  LIVING ENVIRONMENT: Lives with: lives alone Lives in: House/apartment Stairs: No Has following equipment at home: Single point cane and Environmental consultant - 2 wheeled  OCCUPATION: retired  PLOF: Independent with basic ADLs and does not drive - hasn't driven in about a year;  PATIENT GOALS: to try to reduce pain in Lt hip and low back  NEXT MD VISIT: no appt with Dr. Denyse Amass scheduled as of current time:  MRI scheduled 01-03-23 for Lt hip and low back  OBJECTIVE:   DIAGNOSTIC FINDINGS:  MRI scheduled 01-03-23  GAIT: Distance walked: 30' Assistive device utilized: Single point cane Level of assistance: Modified independence Comments: antalgic gait with decreased weight bearing on LLE  TODAY'S TREATMENT:                                                                                                                              DATE:  01-19-23  TherEx: Single knee to chest 10 sec hold - 4 reps each leg  Knee to chest in hooklying 5 reps LLE:    Trunk rotation stretch Forward trunk flexion with red physioball - 10 sec hold x 4 reps  Seated bil. hamstring stretch with pt's Lt & Rt foot placed on stool - flexed foot for more intense stretch  - 15 sec hold x 1 rep Standing bil. Heel cord stretch with heels off edge of step - 1 rep 15 sec hold  Supine position: Pelvic tilt 5 reps 3 sec hold Hip flexion in hooklying position 10 reps LLE heel slides 5 reps 2 sets Lt hip abdct. In hooklying position 10 reps  In Rt sidelying position - Lt clam shell 10 reps     Modified HEP to include exercises in non-weightbearing position for increased ease and comfort of Lt hip Medbridge Access Code: [email or web address] URL: [email or web address] Date: 01/04/2023 Prepared by: Maebelle Munroe  Exercises - new HEP - 01-04-23 - Supine Posterior Pelvic Tilt  - 1 x  daily - 7 x weekly - 1 sets - 10 reps -  3 sec hold - Supine Lower Trunk Rotation  - 1 x daily - 7 x weekly - 1 sets - 4 reps - 15 sec hold - Supine Heel Slide  - 1 x daily - 7 x weekly - 1 sets - 10 reps - Seated Long Arc Quad  - 1 x daily - 7 x weekly - 1 sets - 10 reps - Bent Knee Fallouts  - 1 x daily - 7 x weekly - 1 sets - 10 reps - Supine March  - 1 x daily - 7 x weekly - 1 sets - 10 reps  Medbridge HEP: Access Code: [email or web address] URL: [email or web address] Date: 12/22/2022 Prepared by: Maebelle Munroe  Exercises - Seated Hamstring Stretch  - 1 x daily - 7 x weekly - 1 sets - 1-2 reps - 20-30 sec hold - Seated Hamstring Stretch with Chair  - 1 x daily - 7 x weekly - 1 sets - 1-2 reps - 20-30 sec hold - Beginner Clam  - 1 x daily - 7 x weekly - 1 sets - 10 reps - 3 sec hold hold - Standing Hip Abduction with Counter Support  - 1 x daily - 7 x weekly - 1 sets - 10 reps - Standing Hip Extension with Counter Support  - 1 x daily - 7 x weekly - 1 sets - 10 reps - Standing Hip Flexion AROM  - 1 x daily - 7 x weekly - 1 sets - 10 reps - Seated Hamstring Curl with Anchored Resistance  - 1 x daily - 7 x weekly - 1 sets - 10 reps   PATIENT EDUCATION:  Education details:  Luna Fuse Person educated: Patient Education method: Explanation Education comprehension: needs further education   HOME EXERCISE PROGRAM: Medbridge   ASSESSMENT:  CLINICAL IMPRESSION: PT session focused on stretching and strengthening exercises for LLE stretching and strengthening exercises.  Pt reported increased low back pain today and less hip soreness today.  Pt reported reduced pain and less tightness at end of session after performing stretches and exercises.  Discussed possible TENS unit trial for assist with pain management - pt wishes to hold until MRI results obtained.  Cont with POC.   OBJECTIVE IMPAIRMENTS: Abnormal gait, decreased balance, difficulty walking, decreased strength, and pain.    ACTIVITY LIMITATIONS: carrying, bending, sitting, standing, squatting, and locomotion level  PARTICIPATION LIMITATIONS: meal prep, cleaning, laundry, driving, shopping, community activity, and church  PERSONAL FACTORS: Age, Fitness, Past/current experiences, Time since onset of injury/illness/exacerbation, Transportation, and 1 comorbidity: Lt hip pain of unknown etiology  are also affecting patient's functional outcome.   REHAB POTENTIAL: Fair due to chronicity of pain and lack of benefit from previous cortisone injections  CLINICAL DECISION MAKING: Evolving/moderate complexity  EVALUATION COMPLEXITY: Moderate   GOALS: Goals reviewed with patient? Yes   LONG TERM GOALS: Target date: 01-01-23;  extend 01-11-23  Pt will be independent in HEP for LLE strengthening and ITB stretching. Baseline:  Goal status: Ongoing  2.  Pt will report decreased pain in Lt hip to <6/10 at rest. Baseline: 10/10 intensity on 12-04-22 & on 01-04-23 Goal status: Ongoing  3.  Trial of TENS unit for assist with pain management. Baseline:  Goal status: Deferred until MRI results obtained - 01-04-23   PLAN:  PT FREQUENCY: 1x/week  PT DURATION: 4 weeks  PLANNED INTERVENTIONS: Therapeutic exercises, Therapeutic activity, Neuromuscular re-education, Balance training, Gait training, Patient/Family education, Self Care, Electrical stimulation, Moist heat, Ultrasound, Ionotophoresis 4mg /ml Dexamethasone, and Manual therapy.  PLAN FOR NEXT SESSION: MRI results? check new HEP issued on 01-04-23 for any questions and/or concerns; cont with Lt hip and low back strengthening; Tens trial? D/C next session - or renew  , Donavan Burnet, PT 01/20/2023, 7:05 PM

## 2023-01-20 ENCOUNTER — Encounter: Payer: Self-pay | Admitting: Physical Therapy

## 2023-01-21 ENCOUNTER — Telehealth: Payer: Self-pay | Admitting: *Deleted

## 2023-01-21 ENCOUNTER — Ambulatory Visit: Payer: Self-pay

## 2023-01-21 DIAGNOSIS — M1711 Unilateral primary osteoarthritis, right knee: Secondary | ICD-10-CM

## 2023-01-21 NOTE — Telephone Encounter (Signed)
   Telephone encounter was:  Successful.  01/21/2023 Name: Catherine Hoffman MRN: [id number] DOB: 09-May-1941  Catherine Hoffman is a 82 y.o. year old female who is a primary care patient of Okey Dupre Austin Miles, MD . The community resource team was consulted for assistance with Transportation Needs  This patient wants rides to water aerobics, [facility] transportation cannot provide this provided other options , senior wheels or the senior centers that offer water aerobics, [employer] referal put in and mailed information and also provided [facility] transportation if a need arises for needing medical transportation , Patient voiced understanding  Care guide performed the following interventions: Patient provided with information about care guide support team and interviewed to confirm resource needs.  Follow Up Plan:  No further follow up planned at this time. The patient has been provided with needed resources. Yehuda Mao Greenauer -[facility] [place], Population Health [phone] [address] [place] , [place] [address] Email : [email or web address]

## 2023-01-21 NOTE — Patient Instructions (Addendum)
Visit Information  Thank you for taking time to visit with me today. Please don't hesitate to contact me if I can be of assistance to you.   Following are the goals we discussed today:  Attend provider visits as scheduled Take medications as prescribed Contact provider with health questions or concerns as needed Particpate with outpatient therapist as recommended. Discuss aqua therapy with your Sportmedicine provider  Our next appointment is by telephone on 02/22/23 at 10:30 am  Please call the care guide team at [phone] if you need to cancel or reschedule your appointment.   If you are experiencing a Mental Health or Behavioral Health Crisis or need someone to talk to, please call the Suicide and Crisis Lifeline: 988 call the [place] National Suicide Prevention Lifeline: 503-357-8627 or TTY: (442)709-3588 TTY 780-171-7892) to talk to a trained counselor call 1-800-273-TALK (toll free, 24 hour hotline)  Kathyrn Sheriff, RN, MSN, BSN, CCM [facility] Care Coordinator [phone]

## 2023-01-21 NOTE — Patient Outreach (Signed)
  Care Coordination   Follow Up Visit Note   01/21/2023 Name: Catherine Hoffman MRN: [id number] DOB: 28-Mar-1941  Catherine Hoffman is a 82 y.o. year old female who sees Catherine Broker, MD for primary care. I spoke with  Catherine Hoffman by phone today.  What matters to the patients health and wellness today?  Catherine Hoffman reports she is attending outpatient PT sessions. She reports she continues to have pain back to legss and is awaiting the results of an MRI. She reports she has discussed aquatherapy with her physical therapist, but is in need of another form of transportation besides her friends.   Goals Addressed             This Visit's Progress    Care Coordination Activities       Interventions Today    Flowsheet Row Most Recent Value  Chronic Disease   Chronic disease during today's visit Other  General Interventions   General Interventions Discussed/Reviewed General Interventions Reviewed  Exercise Interventions   Exercise Discussed/Reviewed Exercise Discussed  [discussed patient attending outpatient physical therapy]  Education Interventions   Education Provided Provided Education  Provided Verbal Education On --  [advised to continue to take medications as prescribed, advised to continue to attend therapy sessions as recommended and attend provider appointments as scheduled, encouraged to notify provider with health questions or concerns.]  Safety Interventions   Safety Discussed/Reviewed Safety Discussed, Home Safety  Home Safety Assistive Devices  [reports getting a walker. RNCM discuss possible need for additonal help in the future and encouraged to think about planning]            SDOH assessments and interventions completed:  No  Care Coordination Interventions:  Yes, provided   Follow up plan: Follow up call scheduled for 02/22/23    Encounter Outcome:  Pt. Visit Completed   Catherine Sheriff, RN, MSN, BSN, CCM [facility] Care Coordinator [phone]

## 2023-01-22 ENCOUNTER — Telehealth: Payer: Self-pay | Admitting: *Deleted

## 2023-01-22 NOTE — Progress Notes (Unsigned)
  Care Coordination  Outreach Note  01/22/2023 Name: Catherine Hoffman MRN: [id number] DOB: 10/23/1940   Care Coordination Outreach Attempts: An unsuccessful telephone outreach was attempted today to offer the patient information about available care coordination services.  Follow Up Plan:  Additional outreach attempts will be made to offer the patient care coordination information and services.   Encounter Outcome:  No Answer  Burman Nieves, CCMA Care Coordination Care Guide Direct Dial: [phone]

## 2023-01-25 ENCOUNTER — Encounter: Payer: Self-pay | Admitting: Physical Therapy

## 2023-01-25 ENCOUNTER — Ambulatory Visit: Payer: Medicare PPO | Admitting: Physical Therapy

## 2023-01-25 DIAGNOSIS — M5459 Other low back pain: Secondary | ICD-10-CM | POA: Diagnosis not present

## 2023-01-25 DIAGNOSIS — M25552 Pain in left hip: Secondary | ICD-10-CM | POA: Diagnosis not present

## 2023-01-25 NOTE — Progress Notes (Signed)
  Care Coordination   Note   01/25/2023 Name: JANNINE ARIA MRN: [id number] DOB: Oct 03, 1940  SHANIQWA LEITZ is a 82 y.o. year old female who sees Myrlene Broker, MD for primary care. I reached out to [name] by phone today to offer care coordination services.  Ms. Deshmukh was given information about Care Coordination services today including:   The Care Coordination services include support from the care team which includes your Nurse Coordinator, Clinical Social Worker, or Pharmacist.  The Care Coordination team is here to help remove barriers to the health concerns and goals most important to you. Care Coordination services are voluntary, and the patient may decline or stop services at any time by request to their care team member.   Care Coordination Consent Status: Patient agreed to services and verbal consent obtained.   Follow up plan:  Telephone appointment with care coordination team member scheduled for:  02/01/2023  Encounter Outcome:  Pt. Scheduled from referral   Burman Nieves, [facility] Care Coordination Care Guide Direct Dial: [phone]

## 2023-01-25 NOTE — Therapy (Signed)
OUTPATIENT PHYSICAL THERAPY THORACOLUMBAR TREATMENT NOTE/Re-CERT   Patient Name: Catherine Hoffman MRN: [id number] DOB:1941-04-02, 82 y.o., female Today's Date: 01/25/2023  END OF SESSION:  PT End of Session - 01/25/23 0941     Visit Number 6    Number of Visits 10   4 additional visits requested   Date for PT Re-Evaluation 01/08/23    Authorization Type [employer]    Authorization Time Period 12-03-22 - 02-02-23;  01-25-23 - 03-27-23    Authorization - Visit Number 6    Authorization - Number of Visits 10   4 additional visits requested 01-25-23   PT Start Time 0847    PT Stop Time 0933    PT Time Calculation (min) 46 min    Activity Tolerance Patient limited by pain    Behavior During Therapy [facility] for tasks assessed/performed                  Past Medical History:  Diagnosis Date   Arthritis    Cataract    removed both eyes    Episodic tension type headache    Esophageal reflux    GERD (gastroesophageal reflux disease)    Glaucoma    Heart murmur    as young adult   Hyperlipidemia    Hypertension    Internal hemorrhoids    Kidney stone on left side Hx-date unknown   Left rotator cuff tear    Past Surgical History:  Procedure Laterality Date   COLONOSCOPY  11/20/2008   COLONOSCOPY     KNEE ARTHROSCOPY Left 2004   SHOULDER OPEN ROTATOR CUFF REPAIR Left 08/30/2012   Procedure: ROTATOR CUFF REPAIR SHOULDER OPEN, POSSIBLE GRAFT AND ANCHORS;  Surgeon: Jacki Cones, MD;  Location: [facility];  Service: Orthopedics;  Laterality: Left;   TOTAL ABDOMINAL HYSTERECTOMY W/ BILATERAL SALPINGOOPHORECTOMY  1990's   TOTAL KNEE ARTHROPLASTY Right 09/05/2019   Procedure: RIGHT TOTAL KNEE ARTHROPLASTY;  Surgeon: Marcene Corning, MD;  Location: [facility];  Service: Orthopedics;  Laterality: Right;   Patient Active Problem List   Diagnosis Date Noted   Pre-op evaluation 06/10/2022   Greater trochanteric bursitis, left 05/20/2022   Myalgia 03/11/2022    Overactive bladder 03/11/2022   Right shoulder pain 01/28/2022   Degenerative arthritis of left knee 11/18/2021   Radial head fracture 11/18/2021   Breast pain, left 08/05/2021   Headache 04/15/2021   Acute sinusitis 12/06/2020   Atypical chest pain 09/06/2020   Primary osteoarthritis of right knee 09/05/2019   DDD (degenerative disc disease), lumbar 06/28/2018   Rash 03/18/2018   Venous insufficiency 10/21/2016   Routine general medical examination at a health care facility 02/27/2016   Asthma, mild intermittent 07/19/2015   Essential hypertension 09/28/2010   RENAL CALCULUS 08/08/2010   Hyperlipidemia 02/25/2009   Constipation 10/04/2008   Low back pain 10/04/2008   Chronic allergic rhinitis 09/21/2008   Obesity 06/30/2007   ACID REFLUX DISEASE 06/30/2007    PCP: Myrlene Broker., MD REFERRING PROVIDER: Rodolph Bong, MD  REFERRING DIAG: [phone] (ICD-10-CM) - Left hip pain  Rationale for Evaluation and Treatment: Rehabilitation  THERAPY DIAG:  Other low back pain - Plan: PT plan of care cert/re-cert  Pain in left hip - Plan: PT plan of care cert/re-cert  ONSET DATE: Referral date 11-19-22   SUBJECTIVE:  SUBJECTIVE STATEMENT: Pt reports she has developed a cough and didn't sleep well last night - was in a lot of pain this morning - started to call and cancel this PT appt due to severity of pain.  Says she is feeling a little better now since she has been sitting.  Pt reports she has an appt for an epidural on Thursday this week.  Called about trying to get transportation to the pool for water exercise class - note from [facility] case management in chart states this is not available, as transportation is for medical appts only.  Pt continues to decline trial of Tens unit at this time - wants to  talk to Dr. Denyse Amass first  PERTINENT HISTORY:  lumbar radiculopathy w/ L-side LE weakness; Lt hip trochanteric bursitis - pt reports she received cortisone injection but says it did not help;  lumbar DDD, h/o Rt TKA March 2021  PAIN:  Are you having pain? Yes: NPRS scale: 9-10/10 Pain location: Lt lateral lower - Lt hip and lower back is not as sore Pain description: tight, grimacing, pins and needles in the left lower legs Aggravating factors: sitting on Lt hip makes it worse - cloudy weather makes it worse Relieving factors: Tylenol   PRECAUTIONS: Fall  WEIGHT BEARING RESTRICTIONS: No  FALLS:  Has patient fallen in last 6 months? No  LIVING ENVIRONMENT: Lives with: lives alone Lives in: House/apartment Stairs: No Has following equipment at home: Single point cane and Environmental consultant - 2 wheeled  OCCUPATION: retired  PLOF: Independent with basic ADLs and does not drive - hasn't driven in about a year;  PATIENT GOALS: to try to reduce pain in Lt hip and low back  NEXT MD VISIT: no appt with Dr. Denyse Amass scheduled as of current time:  MRI scheduled 01-03-23 for Lt hip and low back  OBJECTIVE:   DIAGNOSTIC FINDINGS:  MRI scheduled 01-03-23  GAIT: Distance walked: 54' Assistive device utilized: Single point cane Level of assistance: Modified independence Comments: antalgic gait with decreased weight bearing on LLE  TODAY'S TREATMENT:                                                                                                                              DATE:  01-25-23  TherEx: Supine position: Pelvic tilt 5 reps 3 sec hold Hip flexion in hooklying position 8 reps -LLE; 10 reps RLE LLE heel slides 5 reps   Single knee to chest 10 sec hold - 4 reps each leg  Knee to chest in hooklying 5 reps LLE:  5 reps RLE  Trunk rotation stretch in supine - 10 sec hold, then pt reported increased pain in Lt lateral leg so this stretch was discontinued Forward trunk flexion with red  physioball - 10 sec hold attempted - pt reported discomfort in her stomach with this stretch, so this stretch was discontinued  Seated bil. hamstring stretch with pt's Lt foot placed on stool - flexed  foot for more intense stretch  - 15 sec hold x 1 rep Runner's stretch LLE in standing using back of chair - 10 sec hold x 1 rep  In Rt sidelying position - Lt clam shell 6 reps; Lt hip abduction with knee extended 10 reps with min assist due to c/o pain  Nustep exercise - Level 3 x 3" with Ue's and LE's for strengthening and AROM    Modified HEP to include exercises in non-weightbearing position for increased ease and comfort of Lt hip Medbridge Access Code: [email or web address] URL: [email or web address] Date: 01/04/2023 Prepared by: Maebelle Munroe  Exercises - new HEP - 01-04-23 - Supine Posterior Pelvic Tilt  - 1 x daily - 7 x weekly - 1 sets - 10 reps - 3 sec hold - Supine Lower Trunk Rotation  - 1 x daily - 7 x weekly - 1 sets - 4 reps - 15 sec hold - Supine Heel Slide  - 1 x daily - 7 x weekly - 1 sets - 10 reps - Seated Long Arc Quad  - 1 x daily - 7 x weekly - 1 sets - 10 reps - Bent Knee Fallouts  - 1 x daily - 7 x weekly - 1 sets - 10 reps - Supine March  - 1 x daily - 7 x weekly - 1 sets - 10 reps  Medbridge HEP: Access Code: [email or web address] URL: [email or web address] Date: 12/22/2022 Prepared by: Maebelle Munroe  Exercises - Seated Hamstring Stretch  - 1 x daily - 7 x weekly - 1 sets - 1-2 reps - 20-30 sec hold - Seated Hamstring Stretch with Chair  - 1 x daily - 7 x weekly - 1 sets - 1-2 reps - 20-30 sec hold - Beginner Clam  - 1 x daily - 7 x weekly - 1 sets - 10 reps - 3 sec hold hold - Standing Hip Abduction with Counter Support  - 1 x daily - 7 x weekly - 1 sets - 10 reps - Standing Hip Extension with Counter Support  - 1 x daily - 7 x weekly - 1 sets - 10 reps - Standing Hip Flexion AROM  - 1 x daily - 7 x weekly - 1 sets - 10 reps - Seated Hamstring Curl with  Anchored Resistance  - 1 x daily - 7 x weekly - 1 sets - 10 reps   PATIENT EDUCATION:  Education details:  Luna Fuse Person educated: Patient Education method: Explanation Education comprehension: needs further education   HOME EXERCISE PROGRAM: Medbridge   ASSESSMENT:  CLINICAL IMPRESSION: PT session focused on stretching and strengthening exercises for LLE stretching and strengthening exercises.  Pt reported increased left hip pain today and less low back pain.  Session was limited by c/o pain with LLE AROM and with c/o abdominal pain with stretching with use of physioball in seated position.  Pt experiencing a cough today which she states started over the weekend - says the coughing results in an upset stomach.  Cont with POC.   OBJECTIVE IMPAIRMENTS: Abnormal gait, decreased balance, difficulty walking, decreased strength, and pain.   ACTIVITY LIMITATIONS: carrying, bending, sitting, standing, squatting, and locomotion level  PARTICIPATION LIMITATIONS: meal prep, cleaning, laundry, driving, shopping, community activity, and church  PERSONAL FACTORS: Age, Fitness, Past/current experiences, Time since onset of injury/illness/exacerbation, Transportation, and 1 comorbidity: Lt hip pain of unknown etiology  are also affecting patient's functional outcome.   REHAB POTENTIAL: Fair due to chronicity of pain and lack of benefit from  previous cortisone injections  CLINICAL DECISION MAKING: Evolving/moderate complexity  EVALUATION COMPLEXITY: Moderate   GOALS: Goals reviewed with patient? Yes   LONG TERM GOALS: Target date: 01-01-23;  extend 01-11-23  Pt will be independent in HEP for LLE strengthening and ITB stretching. Baseline:  Goal status: Goal met - tolerance/performance of HEP is limited by pain -01-25-23  2.  Pt will report decreased pain in Lt hip to <6/10 at rest. Baseline: 10/10 intensity on 12-04-22 & on 01-04-23; 9-10/10 on 01-25-23 Goal status: Ongoing  3.   Trial of TENS unit for assist with pain management. Baseline:  Goal status: Deferred until MRI results obtained - 01-04-23; pt declines trial of Tens until she gets results of MRI - 01-25-23   NEW LONG TERM GOALS: Target date:  03-12-23 (Extended date due to lack of schedule availability)   Pt will be independent in updated HEP for LLE strengthening and ITB stretching. Baseline:  Goal status: Goal revised/updated - tolerance/performance of HEP is limited by pain -01-25-23  2.  Pt will report decreased pain in Lt hip to <6/10 at rest. Baseline: 10/10 intensity on 12-04-22 & on 01-04-23; 9-10/10 on 01-25-23 Goal status: Ongoing  3.  Trial of TENS unit for assist with pain management. Baseline:  Goal status: Deferred until MRI results obtained - 01-04-23; pt declines trial of Tens until she gets results of MRI - 01-25-23  4.  Pt will ambulate 230' with SPC with c/o low back and Lt hip pain </= 7/10 intensity.    Baseline:  pain intensity 9-10/10 on 01-25-23 with 60' amb. With use of SPC    Goal status:  NEW    5.  Participate in aquatic therapy session if pt able to get transportation to [facility].    Baseline:    Goal status:  NEW  PLAN:  PT FREQUENCY: 1x/week  PT DURATION: 4 weeks  PLANNED INTERVENTIONS: Therapeutic exercises, Therapeutic activity, Neuromuscular re-education, Balance training, Gait training, Patient/Family education, Self Care, Electrical stimulation, Moist heat, Ultrasound, Ionotophoresis 4mg /ml Dexamethasone, and Manual therapy.  PLAN FOR NEXT SESSION: cont with Lt hip and low back strengthening & stretching:  Tens trial?   Kary Kos, PT 01/25/2023, 10:15 AM

## 2023-01-26 ENCOUNTER — Ambulatory Visit: Payer: Medicare PPO | Admitting: Family Medicine

## 2023-01-26 DIAGNOSIS — Z79890 Hormone replacement therapy (postmenopausal): Secondary | ICD-10-CM | POA: Diagnosis not present

## 2023-01-26 DIAGNOSIS — Z780 Asymptomatic menopausal state: Secondary | ICD-10-CM | POA: Diagnosis not present

## 2023-01-26 DIAGNOSIS — Z01419 Encounter for gynecological examination (general) (routine) without abnormal findings: Secondary | ICD-10-CM | POA: Diagnosis not present

## 2023-01-26 DIAGNOSIS — Z1231 Encounter for screening mammogram for malignant neoplasm of breast: Secondary | ICD-10-CM | POA: Diagnosis not present

## 2023-01-27 ENCOUNTER — Ambulatory Visit (INDEPENDENT_AMBULATORY_CARE_PROVIDER_SITE_OTHER): Payer: Medicare PPO

## 2023-01-27 ENCOUNTER — Encounter: Payer: Self-pay | Admitting: Internal Medicine

## 2023-01-27 ENCOUNTER — Ambulatory Visit: Payer: Medicare PPO | Admitting: Internal Medicine

## 2023-01-27 VITALS — BP 126/80 | HR 65 | Temp 97.9°F | Ht 61.0 in | Wt 182.0 lb

## 2023-01-27 DIAGNOSIS — R052 Subacute cough: Secondary | ICD-10-CM | POA: Diagnosis not present

## 2023-01-27 DIAGNOSIS — J0110 Acute frontal sinusitis, unspecified: Secondary | ICD-10-CM | POA: Diagnosis not present

## 2023-01-27 DIAGNOSIS — R918 Other nonspecific abnormal finding of lung field: Secondary | ICD-10-CM | POA: Diagnosis not present

## 2023-01-27 DIAGNOSIS — T7840XA Allergy, unspecified, initial encounter: Secondary | ICD-10-CM

## 2023-01-27 DIAGNOSIS — J4520 Mild intermittent asthma, uncomplicated: Secondary | ICD-10-CM

## 2023-01-27 DIAGNOSIS — I10 Essential (primary) hypertension: Secondary | ICD-10-CM

## 2023-01-27 DIAGNOSIS — R0602 Shortness of breath: Secondary | ICD-10-CM | POA: Diagnosis not present

## 2023-01-27 DIAGNOSIS — J449 Chronic obstructive pulmonary disease, unspecified: Secondary | ICD-10-CM | POA: Diagnosis not present

## 2023-01-27 MED ORDER — PREDNISONE 10 MG PO TABS: ORAL_TABLET | ORAL | 0 refills | Status: DC

## 2023-01-27 MED ORDER — AZITHROMYCIN 250 MG PO TABS: ORAL_TABLET | ORAL | 1 refills | Status: AC

## 2023-01-27 MED ORDER — METHYLPREDNISOLONE ACETATE 80 MG/ML IJ SUSP
80.0000 mg | Freq: Once | INTRAMUSCULAR | Status: AC
Start: 2023-01-27 — End: 2023-01-27
  Administered 2023-01-27: 80 mg via INTRAMUSCULAR

## 2023-01-27 MED ORDER — HYDROCODONE BIT-HOMATROP MBR 5-1.5 MG/5ML PO SOLN: 5.0000 mL | Freq: Four times a day (QID) | ORAL | 0 refills | Status: AC | PRN

## 2023-01-27 NOTE — Progress Notes (Signed)
Patient ID: Catherine Hoffman, female   DOB: 1941-01-19, 82 y.o.   MRN: [id number]        Chief Complaint: follow up URI, allergies with cough, asthma, htn       HPI:  Catherine Hoffman is a 82 y.o. female  Here with 2-3 days acute onset fever, facial pain, pressure, headache, general weakness and malaise, and greenish d/c, with mild ST and cough, but pt denies chest pain, wheezing, increased sob or doe, orthopnea, PND, increased LE swelling, palpitations, dizziness or syncope.  Does have several wks ongoing nasal allergy symptoms with clearish congestion, itch and sneezing, without fever, pain, ST, cough, swelling or wheezing.         Wt Readings from Last 3 Encounters:  01/27/23 182 lb (82.6 kg)  11/19/22 180 lb (81.6 kg)  10/13/22 185 lb (83.9 kg)   BP Readings from Last 3 Encounters:  01/27/23 126/80  11/19/22 118/82  10/13/22 122/84         Past Medical History:  Diagnosis Date   Arthritis    Cataract    removed both eyes    Episodic tension type headache    Esophageal reflux    GERD (gastroesophageal reflux disease)    Glaucoma    Heart murmur    as young adult   Hyperlipidemia    Hypertension    Internal hemorrhoids    Kidney stone on left side Hx-date unknown   Left rotator cuff tear    Past Surgical History:  Procedure Laterality Date   COLONOSCOPY  11/20/2008   COLONOSCOPY     KNEE ARTHROSCOPY Left 2004   SHOULDER OPEN ROTATOR CUFF REPAIR Left 08/30/2012   Procedure: ROTATOR CUFF REPAIR SHOULDER OPEN, POSSIBLE GRAFT AND ANCHORS;  Surgeon: Jacki Cones, MD;  Location: [facility] [place];  Service: Orthopedics;  Laterality: Left;   TOTAL ABDOMINAL HYSTERECTOMY W/ BILATERAL SALPINGOOPHORECTOMY  1990's   TOTAL KNEE ARTHROPLASTY Right 09/05/2019   Procedure: RIGHT TOTAL KNEE ARTHROPLASTY;  Surgeon: Marcene Corning, MD;  Location: [facility];  Service: Orthopedics;  Laterality: Right;    reports that she has never smoked. She has never used smokeless tobacco. She reports  that she does not drink alcohol and does not use drugs. family history includes Cancer in her mother; Diabetes in her mother; Hypertension in her mother; Varicose Veins in her maternal uncle. Allergies  Allergen Reactions   Hydrocodone-Acetaminophen Nausea And Vomiting   Tramadol Itching   Current Outpatient Medications on File Prior to Visit  Medication Sig Dispense Refill   acetaminophen (TYLENOL) 500 MG tablet Take 1,000 mg by mouth every 6 (six) hours as needed for moderate pain.      albuterol (VENTOLIN HFA) 108 (90 Base) MCG/ACT inhaler Inhale 2 puffs into the lungs every 6 (six) hours as needed for wheezing or shortness of breath. 8 g 1   aspirin ([employer] ASPIRIN ADULT LOW DOSE) 81 MG chewable tablet Chew 1 tablet (81 mg total) by mouth daily. 108 tablet 1   calcium-vitamin D (OSCAL WITH D) 500-200 MG-UNIT per tablet Take 1 tablet by mouth daily with breakfast.     cetirizine (ZYRTEC) 10 MG tablet TAKE 1 TABLET BY MOUTH EVERY DAY 90 tablet 3   Cholecalciferol (VITAMIN D3) 50 MCG (2000 UT) TABS Take 2,000 Units by mouth daily.      dextromethorphan (DELSYM) 30 MG/5ML liquid Take 5 mLs (30 mg total) by mouth 2 (two) times daily. 89 mL 0   estradiol (ESTRACE) 0.5 MG  tablet Take by mouth.     fluticasone (FLONASE) 50 MCG/ACT nasal spray PLACE 2 SPRAYS INTO BOTH NOSTRILS DAILY. SHAKE LIQUID AND USE 2 SPRAYS IN EACH NOSTRIL DAILY 48 mL 1   guaiFENesin-codeine 100-10 MG/5ML syrup Take 5 mLs by mouth every 6 (six) hours as needed for cough (Use also prior to MRI to reduce cough). 120 mL 0   latanoprost (XALATAN) 0.005 % ophthalmic solution Place 1 drop into both eyes at bedtime.     meloxicam (MOBIC) 15 MG tablet TAKE 1 TABLET BY MOUTH EVERY DAY 30 tablet 5   omeprazole (PRILOSEC) 40 MG capsule TAKE 1 CAPSULE BY MOUTH TWICE DAILY 30 MINUTES BEFORE FIRST AND LAST MEAL OF THE DAY 180 capsule 1   oxybutynin (DITROPAN-XL) 5 MG 24 hr tablet TAKE 1 TABLET BY MOUTH EVERYDAY AT BEDTIME 90 tablet 1    potassium chloride SA (KLOR-CON M20) 20 MEQ tablet TAKE 1 TABLET BY MOUTH EVERY DAY 90 tablet 1   spironolactone (ALDACTONE) 50 MG tablet Take 1 tablet (50 mg total) by mouth daily. 90 tablet 3   triamcinolone cream (KENALOG) 0.1 % Apply 1 Application topically 2 (two) times daily. 100 g 0   No current facility-administered medications on file prior to visit.        ROS:  All others reviewed and negative.  Objective        PE:  BP 126/80 (BP Location: Right Arm, Patient Position: Sitting, Cuff Size: Normal)   Pulse 65   Temp 97.9 F (36.6 C) (Oral)   Ht 5\' 1"  (1.549 m)   Wt 182 lb (82.6 kg)   SpO2 98%   BMI 34.39 kg/m                 Constitutional: Pt appears in NAD               HENT: Head: NCAT.                Right Ear: External ear normal.                 Left Ear: External ear normal. Bilat tm's with mild erythema.  Max sinus areas mild tender.  Pharynx with mild erythema, no exudate                Eyes: . Pupils are equal, round, and reactive to light. Conjunctivae and EOM are normal               Nose: without d/c or deformity               Neck: Neck supple. Gross normal ROM               Cardiovascular: Normal rate and regular rhythm.                 Pulmonary/Chest: Effort normal and breath sounds without rales or wheezing.                Abd:  Soft, NT, ND, + BS, no organomegaly               Neurological: Pt is alert. At baseline orientation, motor grossly intact               Skin: Skin is warm. No rashes, no other new lesions, LE edema - none               Psychiatric: Pt behavior is normal without agitation   Micro: none  Cardiac tracings I have personally interpreted today:  none  Pertinent Radiological findings (summarize): none   Lab Results  Component Value Date   WBC 5.2 12/02/2021   HGB 12.7 12/02/2021   HCT 39.0 12/02/2021   PLT 191.0 12/02/2021   GLUCOSE 100 (H) 03/10/2022   CHOL 212 (H) 08/01/2020   TRIG 56.0 08/01/2020   HDL 53.50 08/01/2020    LDLDIRECT 150.2 05/30/2012   LDLCALC 148 (H) 08/01/2020   ALT 8 03/10/2022   AST 13 03/10/2022   NA 136 03/10/2022   K 4.4 03/10/2022   CL 103 03/10/2022   CREATININE 1.13 03/10/2022   BUN 22 03/10/2022   CO2 22 03/10/2022   TSH 1.40 08/17/2014   INR 0.9 08/29/2019   HGBA1C 5.8 08/01/2020   Assessment/Plan:  Catherine Hoffman is a 82 y.o. Black or African American [2] female with  has a past medical history of Arthritis, Cataract, Episodic tension type headache, Esophageal reflux, GERD (gastroesophageal reflux disease), Glaucoma, Heart murmur, Hyperlipidemia, Hypertension, Internal hemorrhoids, Kidney stone on left side (Hx-date unknown), and Left rotator cuff tear.  Acute sinusitis Mild to mod, for antibx course zpack,  to f/u any worsening symptoms or concerns  Allergies Mild to mod, for depomedro 80 mg IM, and prednisone taper, to f/u any worsening symptoms or concerns  Asthma, mild intermittent Stable overall, cont inhaler prn  Essential hypertension BP Readings from Last 3 Encounters:  01/27/23 126/80  11/19/22 118/82  10/13/22 122/84   Stable, pt to continue medical treatment  - diet, wt control   Subacute cough Also for cxr  Followup: Return if symptoms worsen or fail to improve.  Oliver Barre, MD 01/30/2023 6:51 AM [facility]

## 2023-01-27 NOTE — Discharge Instructions (Signed)

## 2023-01-27 NOTE — Patient Instructions (Signed)
You had the steroid shot today  Please take all new medication as prescribed - the antibiotic, cough medicine, and prednisone  You may wish to also take OTC Allegra 180 mg per day for allergies as well  Please continue all other medications as before, and refills have been done if requested.  Please have the pharmacy call with any other refills you may need.  Please keep your appointments with your specialists as you may have planned  Please go to the XRAY Department in the first floor for the x-ray testing  You will be contacted by phone if any changes need to be made immediately.  Otherwise, you will receive a letter about your results with an explanation, but please check with MyChart first.

## 2023-01-28 ENCOUNTER — Inpatient Hospital Stay
Admission: RE | Admit: 2023-01-28 | Discharge: 2023-01-28 | Disposition: A | Discharge status: Home / Self Care | Payer: Medicare PPO | Source: Ambulatory Visit | Attending: Family Medicine | Admitting: Family Medicine

## 2023-01-30 ENCOUNTER — Encounter: Payer: Self-pay | Admitting: Internal Medicine

## 2023-01-30 DIAGNOSIS — R052 Subacute cough: Secondary | ICD-10-CM | POA: Insufficient documentation

## 2023-01-30 DIAGNOSIS — T7840XA Allergy, unspecified, initial encounter: Secondary | ICD-10-CM | POA: Insufficient documentation

## 2023-01-30 NOTE — Assessment & Plan Note (Signed)
Mild to mod, for depomedro 80 mg IM, and prednisone taper, to f/u any worsening symptoms or concerns

## 2023-01-30 NOTE — Assessment & Plan Note (Signed)
Also for cxr

## 2023-01-30 NOTE — Assessment & Plan Note (Signed)
Stable overall, cont inhaler prn 

## 2023-01-30 NOTE — Assessment & Plan Note (Signed)
Mild to mod, for antibx course zpack,  to f/u any worsening symptoms or concerns 

## 2023-01-30 NOTE — Assessment & Plan Note (Signed)
BP Readings from Last 3 Encounters:  01/27/23 126/80  11/19/22 118/82  10/13/22 122/84   Stable, pt to continue medical treatment  - diet, wt control

## 2023-02-01 ENCOUNTER — Ambulatory Visit: Payer: Self-pay

## 2023-02-01 NOTE — Patient Outreach (Signed)
  Care Coordination   Follow Up Visit Note   02/01/2023 Name: Catherine Hoffman MRN: [id number] DOB: [birth year]  Catherine Hoffman is a 82 y.o. year old female who sees Catherine Broker, MD for primary care. I spoke with  Catherine Hoffman by phone today.  What matters to the patients health and wellness today?  Discussed the patient would like to apply for Medicaid in order to utilize Medicaid transportation [employer]. Determined the patient is unsure what her monthly income amount is. Reviewed options to apply for Medicaid - patient would like to engage with DSS workers located at the [employer] satellite office. Referral sent to [facility] who will contact the patient to assist with an application.  SDOH assessments and interventions completed:  No  Care Coordination Interventions:  Yes, provided   Interventions Today    Flowsheet Row Most Recent Value  Chronic Disease   Chronic disease during today's visit Other  [Trabsportation Barriers]  General Interventions   General Interventions Discussed/Reviewed General Interventions Discussed, Tax adviser to [employer] office at [facility] [facility]]  Education Interventions   Education Provided Provided Education  Provided Verbal Education On Insurance Plans        Follow up plan: No further intervention required.   Encounter Outcome:  Pt. Visit Completed   Catherine Hoffman, BSW, CDP Social Worker, Certified Dementia Practitioner [facility] Care Management  Care Coordination [phone]

## 2023-02-01 NOTE — Patient Instructions (Signed)
Visit Information  Thank you for taking time to visit with me today. Please don't hesitate to contact me if I can be of assistance to you.   Following are the goals we discussed today:  - Work with [employer] to complete a Medicaid application   If you are experiencing a Mental Health or Behavioral Health Crisis or need someone to talk to, please call 1-800-273-TALK (toll free, 24 hour hotline) go to [facility] [address], [place] [phone]) call 911  The patient verbalized understanding of instructions, educational materials, and care plan provided today and DECLINED offer to receive copy of patient instructions, educational materials, and care plan.   No further follow up required: Please contact me as needed.  Bevelyn Ngo, BSW, CDP Social Worker, Certified Dementia Practitioner [facility] Care Management  Care Coordination [phone]

## 2023-02-03 ENCOUNTER — Encounter: Payer: Self-pay | Admitting: Internal Medicine

## 2023-02-09 ENCOUNTER — Other Ambulatory Visit: Payer: Self-pay | Admitting: Family Medicine

## 2023-02-09 ENCOUNTER — Other Ambulatory Visit: Payer: Self-pay | Admitting: Internal Medicine

## 2023-02-11 ENCOUNTER — Ambulatory Visit: Payer: Medicare PPO | Attending: Internal Medicine | Admitting: Physical Therapy

## 2023-02-11 DIAGNOSIS — R262 Difficulty in walking, not elsewhere classified: Secondary | ICD-10-CM | POA: Insufficient documentation

## 2023-02-11 DIAGNOSIS — M25552 Pain in left hip: Secondary | ICD-10-CM | POA: Insufficient documentation

## 2023-02-15 ENCOUNTER — Encounter: Payer: Self-pay | Admitting: Physical Therapy

## 2023-02-15 ENCOUNTER — Ambulatory Visit: Payer: Medicare PPO | Admitting: Physical Therapy

## 2023-02-15 DIAGNOSIS — M25552 Pain in left hip: Secondary | ICD-10-CM

## 2023-02-15 DIAGNOSIS — R262 Difficulty in walking, not elsewhere classified: Secondary | ICD-10-CM

## 2023-02-15 NOTE — Therapy (Addendum)
OUTPATIENT PHYSICAL THERAPY THORACOLUMBAR TREATMENT NOTE/DISCHARGE SUMMARY   Patient Name: Catherine Hoffman MRN: [id number] DOB:Jun 04, 1941, 82 y.o., female Today's Date: 02/15/2023  END OF SESSION:  PT End of Session - 02/15/23 0800     Visit Number 7    Number of Visits 10   4 additional visits requested   Date for PT Re-Evaluation 01/08/23    Authorization Type [employer]    Authorization Time Period 12-03-22 - 02-02-23;  01-25-23 - 03-27-23    Authorization - Visit Number 7    Authorization - Number of Visits 10   4 additional visits requested 01-25-23   PT Start Time 0801    PT Stop Time 0850    PT Time Calculation (min) 49 min    Activity Tolerance Patient tolerated treatment well    Behavior During Therapy [facility] for tasks assessed/performed                  Past Medical History:  Diagnosis Date   Arthritis    Cataract    removed both eyes    Episodic tension type headache    Esophageal reflux    GERD (gastroesophageal reflux disease)    Glaucoma    Heart murmur    as young adult   Hyperlipidemia    Hypertension    Internal hemorrhoids    Kidney stone on left side Hx-date unknown   Left rotator cuff tear    Past Surgical History:  Procedure Laterality Date   COLONOSCOPY  11/20/2008   COLONOSCOPY     KNEE ARTHROSCOPY Left 2004   SHOULDER OPEN ROTATOR CUFF REPAIR Left 08/30/2012   Procedure: ROTATOR CUFF REPAIR SHOULDER OPEN, POSSIBLE GRAFT AND ANCHORS;  Surgeon: Jacki Cones, MD;  Location: [facility];  Service: Orthopedics;  Laterality: Left;   TOTAL ABDOMINAL HYSTERECTOMY W/ BILATERAL SALPINGOOPHORECTOMY  1990's   TOTAL KNEE ARTHROPLASTY Right 09/05/2019   Procedure: RIGHT TOTAL KNEE ARTHROPLASTY;  Surgeon: Marcene Corning, MD;  Location: [facility];  Service: Orthopedics;  Laterality: Right;   Patient Active Problem List   Diagnosis Date Noted   Allergies 01/30/2023   Subacute cough 01/30/2023   Pre-op evaluation 06/10/2022   Greater  trochanteric bursitis, left 05/20/2022   Myalgia 03/11/2022   Overactive bladder 03/11/2022   Right shoulder pain 01/28/2022   Degenerative arthritis of left knee 11/18/2021   Radial head fracture 11/18/2021   Breast pain, left 08/05/2021   Headache 04/15/2021   Acute sinusitis 12/06/2020   Atypical chest pain 09/06/2020   Primary osteoarthritis of right knee 09/05/2019   DDD (degenerative disc disease), lumbar 06/28/2018   Rash 03/18/2018   Venous insufficiency 10/21/2016   Routine general medical examination at a health care facility 02/27/2016   Asthma, mild intermittent 07/19/2015   Essential hypertension 09/28/2010   RENAL CALCULUS 08/08/2010   Hyperlipidemia 02/25/2009   Constipation 10/04/2008   Low back pain 10/04/2008   Chronic allergic rhinitis 09/21/2008   Obesity 06/30/2007   ACID REFLUX DISEASE 06/30/2007    PCP: Myrlene Broker., MD REFERRING PROVIDER: Rodolph Bong, MD  REFERRING DIAG: [phone] (ICD-10-CM) - Left hip pain  Rationale for Evaluation and Treatment: Rehabilitation  THERAPY DIAG:  Pain in left hip  Difficulty in walking, not elsewhere classified  ONSET DATE: Referral date 11-19-22   SUBJECTIVE:  SUBJECTIVE STATEMENT: Pt reports she went to [facility] on 02-01-23 for a few days and felt great - says the weather was beautiful - not cloudy or rainy and she had no hip pain.  Was able to walk without her cane when she was there - came back home to cloudy weather and says the pain returned.  Pt reports she rubbed an analgesic cream on her Lt hip and lateral thigh this morning before coming to PT and says that took the pain away - reports no pain at this time.  Pt reports she has appt on Thursday this week for epidural injection.   PERTINENT HISTORY:  lumbar  radiculopathy w/ L-side LE weakness; Lt hip trochanteric bursitis - pt reports she received cortisone injection but says it did not help;  lumbar DDD, h/o Rt TKA March 2021  PAIN:   NO Pain on 02-15-23   Are you having pain? Yes: NPRS scale: 0/10 Pain location: Lt lateral lower - Lt hip and lower back is not as sore Pain description: tight, grimacing, pins and needles in the left lower legs Aggravating factors: sitting on Lt hip makes it worse - cloudy weather makes it worse Relieving factors: Tylenol   PRECAUTIONS: Fall  WEIGHT BEARING RESTRICTIONS: No  FALLS:  Has patient fallen in last 6 months? No  LIVING ENVIRONMENT: Lives with: lives alone Lives in: House/apartment Stairs: No Has following equipment at home: Single point cane and Environmental consultant - 2 wheeled  OCCUPATION: retired  PLOF: Independent with basic ADLs and does not drive - hasn't driven in about a year;  PATIENT GOALS: to try to reduce pain in Lt hip and low back  NEXT MD VISIT: no appt with Dr. Denyse Amass scheduled as of current time:  MRI scheduled 01-03-23 for Lt hip and low back  OBJECTIVE:   DIAGNOSTIC FINDINGS:  MRI scheduled 01-03-23  GAIT: Distance walked: 66' Assistive device utilized: Single point cane Level of assistance: Modified independence Comments: antalgic gait with decreased weight bearing on LLE  TODAY'S TREATMENT:                                                                                                                              DATE:  02-15-23  TherEx: Supine position: Pelvic tilt 10 reps 5 sec hold Hip flexion in hooklying position 10 reps -LLE; 10 reps RLE LLE heel slides 5 reps RLE:  10 reps LLE SLR LLE 8 reps - pt felt some discomfort in Lt hip after 8th rep so this exercise was discontinued Hip abduction LLE 10 reps - in Rt sidelying position Clam shell LLE in Rt sidelying position 10 reps Trunk rotation stretch in supine - 10 sec hold to Rt and to Lt side  Step up exercise 10 reps  RLE and 10 reps LLE onto 6" step with bil. UE support   Seated bil. hamstring stretch with pt's Lt foot placed on stool - flexed foot for more intense  stretch  - 15 sec hold x 1 rep  Nustep exercise - Level 2.5 x 6" with Ue's and LE's for strengthening and AROM   Pt amb. From mat table to steps (approx. 25')   Modified HEP to include exercises in non-weightbearing position for increased ease and comfort of Lt hip Medbridge Access Code: [email or web address] URL: [email or web address] Date: 01/04/2023 Prepared by: Maebelle Munroe  Exercises - new HEP - 01-04-23 - Supine Posterior Pelvic Tilt  - 1 x daily - 7 x weekly - 1 sets - 10 reps - 3 sec hold - Supine Lower Trunk Rotation  - 1 x daily - 7 x weekly - 1 sets - 4 reps - 15 sec hold - Supine Heel Slide  - 1 x daily - 7 x weekly - 1 sets - 10 reps - Seated Long Arc Quad  - 1 x daily - 7 x weekly - 1 sets - 10 reps - Bent Knee Fallouts  - 1 x daily - 7 x weekly - 1 sets - 10 reps - Supine March  - 1 x daily - 7 x weekly - 1 sets - 10 reps  Medbridge HEP: Access Code: [email or web address] URL: [email or web address] Date: 12/22/2022 Prepared by: Maebelle Munroe  Exercises - Seated Hamstring Stretch  - 1 x daily - 7 x weekly - 1 sets - 1-2 reps - 20-30 sec hold - Seated Hamstring Stretch with Chair  - 1 x daily - 7 x weekly - 1 sets - 1-2 reps - 20-30 sec hold - Beginner Clam  - 1 x daily - 7 x weekly - 1 sets - 10 reps - 3 sec hold hold - Standing Hip Abduction with Counter Support  - 1 x daily - 7 x weekly - 1 sets - 10 reps - Standing Hip Extension with Counter Support  - 1 x daily - 7 x weekly - 1 sets - 10 reps - Standing Hip Flexion AROM  - 1 x daily - 7 x weekly - 1 sets - 10 reps - Seated Hamstring Curl with Anchored Resistance  - 1 x daily - 7 x weekly - 1 sets - 10 reps   PATIENT EDUCATION:  Education details:  Luna Fuse Person educated: Patient Education method: Explanation Education comprehension: needs  further education   HOME EXERCISE PROGRAM: Medbridge   ASSESSMENT:  CLINICAL IMPRESSION: PT session focused on stretching and strengthening Lt hip and low back musc.  Pt reporting no Lt hip pain for first time in today's PT session and ambulated approx. 25' from mat table to steps without use of SPC.  Pt's mobility significantly increased in today's session without c/o pain.  Cont to recommend aquatic exercise, which pt is agreeable to pursuing when she is able to return to driving.  Transportation limited at this time as pt has been informed not to drive due to LLE weakness.  Cont with POC.   OBJECTIVE IMPAIRMENTS: Abnormal gait, decreased balance, difficulty walking, decreased strength, and pain.   ACTIVITY LIMITATIONS: carrying, bending, sitting, standing, squatting, and locomotion level  PARTICIPATION LIMITATIONS: meal prep, cleaning, laundry, driving, shopping, community activity, and church  PERSONAL FACTORS: Age, Fitness, Past/current experiences, Time since onset of injury/illness/exacerbation, Transportation, and 1 comorbidity: Lt hip pain of unknown etiology  are also affecting patient's functional outcome.   REHAB POTENTIAL: Fair due to chronicity of pain and lack of benefit from previous cortisone injections  CLINICAL DECISION MAKING: Evolving/moderate complexity  EVALUATION COMPLEXITY: Moderate   GOALS: Goals reviewed with patient?  Yes   LONG TERM GOALS: Target date: 01-01-23;  extend 01-11-23  Pt will be independent in HEP for LLE strengthening and ITB stretching. Baseline:  Goal status: Goal met - tolerance/performance of HEP is limited by pain -01-25-23  2.  Pt will report decreased pain in Lt hip to <6/10 at rest. Baseline: 10/10 intensity on 12-04-22 & on 01-04-23; 9-10/10 on 01-25-23 Goal status: Ongoing  3.  Trial of TENS unit for assist with pain management. Baseline:  Goal status: Deferred until MRI results obtained - 01-04-23; pt declines trial of Tens until  she gets results of MRI - 01-25-23   NEW LONG TERM GOALS: Target date:  03-12-23 (Extended date due to lack of schedule availability)   Pt will be independent in updated HEP for LLE strengthening and ITB stretching. Baseline:  Goal status: Goal met 02-22-23  2.  Pt will report decreased pain in Lt hip to <6/10 at rest. Baseline: 10/10 intensity on 12-04-22 & on 01-04-23; 9-10/10 on 01-25-23 Goal status: Ongoing  3.  Trial of TENS unit for assist with pain management. Baseline:  Goal status: Deferred until MRI results obtained - 01-04-23; pt declines trial of Tens until she gets results of MRI - 01-25-23  4.  Pt will ambulate 230' with SPC with c/o low back and Lt hip pain </= 7/10 intensity.    Baseline:  pain intensity 9-10/10 on 01-25-23 with 60' amb. With use of SPC    Goal status:  NEW    5.  Participate in aquatic therapy session if pt able to get transportation to [facility].    Baseline:    Goal status:   Deferred - pt does not have transportation to [place] at this time - 02-22-23  PLAN:  PT FREQUENCY: 1x/week  PT DURATION: 4 weeks  PLANNED INTERVENTIONS: Therapeutic exercises, Therapeutic activity, Neuromuscular re-education, Balance training, Gait training, Patient/Family education, Self Care, Electrical stimulation, Moist heat, Ultrasound, Ionotophoresis 4mg /ml Dexamethasone, and Manual therapy.  PLAN FOR NEXT SESSION: cont with Lt hip and low back strengthening & stretching:  Tens trial?    Jamirah Zelaya Suzanne, PT 02/15/2023, 8:19 PM    PHYSICAL THERAPY DISCHARGE SUMMARY  Visits from Start of Care: 7  Current functional level related to goals / functional outcomes: LTG's not formally assessed as pt cancelled the last 2 scheduled appts due to having had epidural injection on Thursday, Sept. 12.  Pt called and cancelled today's appt and the last scheduled appt next week.  Pt reports she was instructed to hold exercise, including no strenuous lifting for  2 weeks.   Remaining deficits: Pt reported no hip pain vial phone conversation on 02-22-23;  pt reports back pain continues, possibly due in part to cloudy weather   Education / Equipment: Pt has been instructed in HEP for low back and LE stretching and strengthening exercises.   Patient agrees to discharge. Patient goals were partially met. Patient is being discharged due to the patient's request.    Kerry Fort, PT 02/22/2023, 8:37 AM

## 2023-02-17 NOTE — Discharge Instructions (Signed)
Post Procedure Spinal Discharge Instruction Sheet  You may resume a regular diet and any medications that you routinely take (including pain medications) unless otherwise noted by MD.  No driving day of procedure.  Light activity throughout the rest of the day.  Do not do any strenuous work, exercise, bending or lifting.  The day following the procedure, you can resume normal physical activity but you should refrain from exercising or physical therapy for at least three days thereafter.  You may apply ice to the injection site, 20 minutes on, 20 minutes off, as needed. Do not apply ice directly to skin.    Common Side Effects:  Headaches- take your usual medications as directed by your physician.  Increase your fluid intake.  Caffeinated beverages may be helpful.  Lie flat in bed until your headache resolves.  Restlessness or inability to sleep- you may have trouble sleeping for the next few days.  Ask your referring physician if you need any medication for sleep.  Facial flushing or redness- should subside within a few days.  Increased pain- a temporary increase in pain a day or two following your procedure is not unusual.  Take your pain medication as prescribed by your referring physician.  Leg cramps  Please contact our office at [phone] for the following symptoms: Fever greater than 100 degrees. Headaches unresolved with medication after 2-3 days. Increased swelling, pain, or redness at injection site.   Thank you for visiting [facility] today.    YOU MAY RESUME YOUR ASPIRIN TODAY, POST PROCEDURE.

## 2023-02-18 ENCOUNTER — Ambulatory Visit
Admission: RE | Admit: 2023-02-18 | Discharge: 2023-02-18 | Disposition: A | Discharge status: Home / Self Care | Payer: Medicare PPO | Source: Ambulatory Visit | Attending: Family Medicine | Admitting: Family Medicine

## 2023-02-18 DIAGNOSIS — M4727 Other spondylosis with radiculopathy, lumbosacral region: Secondary | ICD-10-CM | POA: Diagnosis not present

## 2023-02-18 DIAGNOSIS — M5416 Radiculopathy, lumbar region: Secondary | ICD-10-CM

## 2023-02-18 DIAGNOSIS — M48061 Spinal stenosis, lumbar region without neurogenic claudication: Secondary | ICD-10-CM | POA: Diagnosis not present

## 2023-02-18 MED ORDER — IOPAMIDOL (ISOVUE-M 200) INJECTION 41%
1.0000 mL | Freq: Once | INTRAMUSCULAR | Status: AC
  Administered 2023-02-18: 1 mL via EPIDURAL

## 2023-02-18 MED ORDER — METHYLPREDNISOLONE ACETATE 40 MG/ML INJ SUSP (RADIOLOG
80.0000 mg | Freq: Once | INTRAMUSCULAR | Status: AC
  Administered 2023-02-18: 80 mg via EPIDURAL

## 2023-02-22 ENCOUNTER — Other Ambulatory Visit: Payer: Self-pay | Admitting: Internal Medicine

## 2023-02-22 ENCOUNTER — Ambulatory Visit: Payer: Self-pay

## 2023-02-22 ENCOUNTER — Ambulatory Visit: Payer: Medicare PPO | Admitting: Physical Therapy

## 2023-02-22 NOTE — Patient Outreach (Signed)
Care Coordination   Follow Up Visit Note   02/22/2023 Name: DYUTHI EUSTACHE MRN: [id number] DOB: 1940-06-16  MARLEANA ALMQUIST is a 82 y.o. year old female who sees Myrlene Broker, MD for primary care. I spoke with  [name] by phone today.  What matters to the patients health and wellness today?  Reports getting series injections for Lumbar radiculopathy-she is followed by Sports Medicine provider. She reports it is beginning to help some. She states provided has limited exercise at this time. She states her Motorized wheel chair has been approved and she will be receiving Motorized wheel chair coming 04/09/23. She denies any questions or any care management needs. Patient confirms she has RNCM's contact number and will call if a higher level of care or any other care management need in the future.   Goals Addressed             This Visit's Progress    COMPLETED: Care Coordination Activities       Interventions Today    Flowsheet Row Most Recent Value  Chronic Disease   Chronic disease during today's visit Other  [Lumbar radiculopathy]  General Interventions   General Interventions Discussed/Reviewed General Interventions Reviewed, Durable Medical Equipment (DME)  Durable Medical Equipment (DME) Wheelchair  Wheelchair Motorized  [Confirmed with patient that her motorized wheelchair has been processed and she is expecting it on 04/09/23.]  Exercise Interventions   Exercise Discussed/Reviewed Exercise Discussed  Education Interventions   Education Provided Provided Education  Provided Verbal Education On When to see the doctor, Medication, Exercise  [advised to continue to take medications as prescribed, attend provider visits as scheduled, discussed [place] Aquatic therapy per provider referral if provider recommends in the future.]  Pharmacy Interventions   Pharmacy Dicussed/Reviewed Pharmacy Topics Reviewed  Safety Interventions   Safety Discussed/Reviewed Safety Reviewed,  Fall Risk             SDOH assessments and interventions completed:  No  Care Coordination Interventions:  Yes, provided   Follow up plan: No further intervention required.   Encounter Outcome:  Patient Visit Completed   Kathyrn Sheriff, RN, MSN, BSN, CCM Care Management Coordinator [phone]

## 2023-02-22 NOTE — Patient Instructions (Signed)
Visit Information  Thank you for taking time to visit with me today. Please don't hesitate to contact me if I can be of assistance to you.   Following are the goals we discussed today:  Continue to attend provider visits as scheduled Continue to take your medications as prescribed Contact your provider with any health questions or concerns   If you are experiencing a Mental Health or Behavioral Health Crisis or need someone to talk to, please call the Suicide and Crisis Lifeline: 988 call the [place] National Suicide Prevention Lifeline: 3197981564 or TTY: 807-233-2124 TTY (272)460-5698) to talk to a trained counselor call 1-800-273-TALK (toll free, 24 hour hotline)  Kathyrn Sheriff, RN, MSN, BSN, CCM Care Management Coordinator [phone]

## 2023-03-01 ENCOUNTER — Ambulatory Visit: Payer: Medicare PPO | Admitting: Physical Therapy

## 2023-03-25 ENCOUNTER — Ambulatory Visit: Payer: Medicare PPO | Admitting: Internal Medicine

## 2023-03-26 ENCOUNTER — Ambulatory Visit (INDEPENDENT_AMBULATORY_CARE_PROVIDER_SITE_OTHER): Payer: Medicare PPO | Admitting: Internal Medicine

## 2023-03-26 ENCOUNTER — Other Ambulatory Visit: Payer: Self-pay | Admitting: Internal Medicine

## 2023-03-26 ENCOUNTER — Encounter: Payer: Self-pay | Admitting: Internal Medicine

## 2023-03-26 VITALS — BP 138/100 | HR 71 | Temp 97.8°F | Ht 61.0 in | Wt 180.0 lb

## 2023-03-26 DIAGNOSIS — M51362 Other intervertebral disc degeneration, lumbar region with discogenic back pain and lower extremity pain: Secondary | ICD-10-CM | POA: Diagnosis not present

## 2023-03-26 MED ORDER — MONTELUKAST SODIUM 10 MG PO TABS: 10.0000 mg | ORAL_TABLET | Freq: Every day | ORAL | 3 refills | Status: DC

## 2023-03-26 NOTE — Patient Instructions (Signed)
We will get the papers faxed today to the mobility place.

## 2023-03-26 NOTE — Progress Notes (Signed)
Subjective:   Patient ID: Catherine Hoffman, female    DOB: December 11, 1940, 82 y.o.   MRN: [id number]   The patient is an 82 YO female coming in for purpose of mobility examination. She has seen PT and assessment this is copy and pasted below and I do agree with the assessment which is reviewed with patient and provider during visit.  OUTPATIENT PHYSICAL THERAPY WHEELCHAIR EVALUATION     Patient Name: Catherine Hoffman MRN: [id number] DOB:Oct 27, 1940, 82 y.o., female Today's Date: 01/05/2023   END OF SESSION:   PT End of Session - 01/05/23 1239       Visit Number 1     Number of Visits 1     Date for PT Re-Evaluation 01/05/23     Authorization Type [employer]     PT Start Time 1235     PT Stop Time 1330     PT Time Calculation (min) 55 min     Equipment Utilized During Treatment Gait belt     Activity Tolerance Patient tolerated treatment well     Behavior During Therapy WFL for tasks assessed/performed                       Past Medical History:  Diagnosis Date   Arthritis     Cataract      removed both eyes    Episodic tension type headache     Esophageal reflux     GERD (gastroesophageal reflux disease)     Glaucoma     Heart murmur      as young adult   Hyperlipidemia     Hypertension     Internal hemorrhoids     Kidney stone on left side Hx-date unknown   Left rotator cuff tear               Past Surgical History:  Procedure Laterality Date   COLONOSCOPY   11/20/2008   COLONOSCOPY       KNEE ARTHROSCOPY Left 2004   SHOULDER OPEN ROTATOR CUFF REPAIR Left 08/30/2012    Procedure: ROTATOR CUFF REPAIR SHOULDER OPEN, POSSIBLE GRAFT AND ANCHORS;  Surgeon: Jacki Cones, MD;  Location: [facility];  Service: Orthopedics;  Laterality: Left;   TOTAL ABDOMINAL HYSTERECTOMY W/ BILATERAL SALPINGOOPHORECTOMY   1990's   TOTAL KNEE ARTHROPLASTY Right 09/05/2019    Procedure: RIGHT TOTAL KNEE ARTHROPLASTY;  Surgeon: Marcene Corning, MD;  Location: [facility];   Service: Orthopedics;  Laterality: Right;            Patient Active Problem List    Diagnosis Date Noted   Pre-op evaluation 06/10/2022   Greater trochanteric bursitis, left 05/20/2022   Myalgia 03/11/2022   Overactive bladder 03/11/2022   Right shoulder pain 01/28/2022   Degenerative arthritis of left knee 11/18/2021   Radial head fracture 11/18/2021   Breast pain, left 08/05/2021   Headache 04/15/2021   Acute sinusitis 12/06/2020   Atypical chest pain 09/06/2020   Primary osteoarthritis of right knee 09/05/2019   DDD (degenerative disc disease), lumbar 06/28/2018   Rash 03/18/2018   Venous insufficiency 10/21/2016   Routine general medical examination at a health care facility 02/27/2016   Asthma, mild intermittent 07/19/2015   Essential hypertension 09/28/2010   RENAL CALCULUS 08/08/2010   Hyperlipidemia 02/25/2009   Constipation 10/04/2008   Low back pain 10/04/2008   Chronic allergic rhinitis 09/21/2008   Obesity 06/30/2007   ACID REFLUX DISEASE 06/30/2007  PCP: Myrlene Broker, MD   REFERRING PROVIDER: Myrlene Broker, MD   THERAPY DIAG:  Difficulty in walking, not elsewhere classified   Pain in left hip   Other low back pain   Rationale for Evaluation and Treatment Rehabilitation   SUBJECTIVE:                                                                                                                                                                                            SUBJECTIVE STATEMENT: Pt presents for wheelchair evaluation with daughter Margarette [place]. Patiet reports PMH of osteoarthritis, low back pain, L rotator cuff repair, degenerative disc disease, and significant L hip pain. Patient currently using [facility] for mobility but reports about one near fall a day over the last 6 months. Patient is looking for a way to more safely move around home while also managing her severe pain.    PRECAUTIONS: Fall   RED FLAGS: None        WEIGHT BEARING RESTRICTIONS No      OCCUPATION: Retired   PLOF:   Requires some assistance with ADLs from ADLs   PATIENT GOALS: "Have a way to get around easier."      MEDICAL HISTORY:      Primary diagnosis onset: 10/13/2022 (referral date)     Medical Diagnosis with ICD-10 code: M51.36 (ICD-10-CM) - DDD (degenerative disc disease), lumbar M17.12 (ICD-10-CM) - Primary osteoarthritis of left knee    [] Progressive disease  Relevant future surgeries:     Height: 5'1' Weight: 180 lbs Explain recent changes or trends in weight:  Denies major fluctuations     History:      Past Medical History:  Diagnosis Date   Arthritis     Cataract      removed both eyes    Episodic tension type headache     Esophageal reflux     GERD (gastroesophageal reflux disease)     Glaucoma     Heart murmur      as young adult   Hyperlipidemia     Hypertension     Internal hemorrhoids     Kidney stone on left side Hx-date unknown   Left rotator cuff tear                         Cardio Status:           Functional Limitations:   [x] Intact  []  Impaired      Respiratory Status:           Functional Limitations:   [x] Intact  [] Impaired   [] SOB [] COPD []   O2 Dependent ______LPM  [] Ventilator Dependent  Resp equip:                                                     Objective Measure(s):   Orthotics:   [] Amputee:                                                             [] Prosthesis:          HOME ENVIRONMENT:  [] House [] Condo/town home [x] Apartment [] Asst living [] LTCF         [] Own  [x] Rent   [x] Lives alone [] Lives with others -                             Hours without assistance: 8+  [x] Home is accessible to patient:   Level entrance no stairs; hardwood floors                              Storage of wheelchair:  [x] In home   [] Other Comments:          COMMUNITY :     TRANSPORTATION:  [x] Car [] Retail buyer [] Adapted w/c Lift []  Ambulance [] Other:                      [] Sits in wheelchair during transport   Where is w/c stored during transport? In trunk [x] Tie Downs  []  EZ Lock  r   [] Self-Driver       Drive while in  Biomedical scientist [] yes [x] no   Employment and/or school:  Specific requirements pertaining to mobility    Patient is wanting to use the wheelchair when she gets up in the morning and is more unstable, is wanting a device to go to medical appointments and out with family for longer distances, is unable to currently go get her mail or take out her trash given mobility impairments     Other:  COMMUNICATION:  Verbal Communication  [x] WFL [] receptive [] WFL [] expressive [] Understandable  [] Difficult to understand  [] non-communicative  Primary Language:__English____________ 2nd:_____________  Communication provided by:[x] Patient [x] Family [] Caregiver [] Translator   [] Uses an augmentative communication device     Manufacturer/Model :                                                                                       MOBILITY/BALANCE:  Sitting Balance  Standing Balance  Transfers  Ambulation   [x] WFL      [] WFL  [] Independent  []  Independent   [] Uses UE for balance in sitting Comments:  [x] Uses UE/device for stability Comments: SPC but is unsteady and limited by pain [x]  Min assist - requires use of SPC, patient unsteady with transfers []  Ambulates independently with  device:___________________      []  Mod assist  []  Able to ambulate ______ feet        safely/functionally/independently   []  Min assist  []  Min assist  []  Max assist  [x]  Non-functional ambulator         History/High risk of falls   []  Mod assist  []  Mod assist  []  Dependent  []  Unable to ambulate   []  Max  assist  []  Max assist  Transfer method:[] 1 person [] 2 person [] sliding board [] squat pivot [x] stand pivot [] mechanical patient lift  [] other:   []  Unable  []  Unable    Fall History: # of falls in the past 6 months? No # of "near" falls in the past 6 months? About one near  fall a day when getting up in morning per patient and daughter, also one major near fall last Sunday at church    CURRENT SEATING / MOBILITY:      Current Mobility Device: [] None [x] Cane/Walker [] Manual [] Dependent [] Dependent w/ Tilt rScooter  [] Power (type of control):   Manufacturer: Unknown Model:  Serial #:   Size:  Color:  Age:   Purchased by whom:   Current condition of mobility base:    Current seating system:                                                                       Age of seating system:    Describe posture in present seating system:    Is the current mobility meeting medical necessity?:  [] Yes [x] No Describe:         Ability to complete Mobility-Related Activities of Daily Living (MRADL's) with Current Mobility Device:   Move room to room  [] Independent  [x] Min [] Mod [] Max assist  [] Unable  Comments: Requires raised toilet seat to help make  it easier on toilet, uses SPC and 2WW for transfers but is a high falls risk and reports multiple near falls; patient reports pain greatly reduces speed and safety  Meal prep  [] Independent  [x] Min [] Mod [] Max assist  [] Unable    Feeding  [x] Independent  [] Min [] Mod [] Max assist  [] Unable    Bathing  [x] Independent  [] Min [] Mod [] Max assist  [] Unable    Grooming  [x] Independent  [] Min [] Mod [] Max assist  [] Unable    UE dressing  [x] Independent  [] Min [] Mod [] Max assist  [] Unable    LE dressing  [x] Independent   [] Min [] Mod [] Max assist  [] Unable    Toileting  [] Independent  [x] Min [] Mod [] Max assist  [] Unable    Bowel Mgt: [x]  Continent []  Incontinent []  Accidents []  Diapers []  Colostomy []  Bowel Program:  Bladder Mgt: [x]  Continent []  Incontinent []  Accidents []  Diapers []  Urinal []  Intermittent Cath []  Indwelling Cath []  Supra-pubic Cath                  Current Mobility Equipment Trialed/ Ruled Out:    Does not meet mobility needs due to:    Loraine Leriche all boxes that indicate inability to use the specific equipment listed      Meets needs for safe  independent functional  ambulation  / mobility    Risk of  Falling or History of Falls    Enviromental limitations  Cognition    Safety concerns with  physical ability    Decreased / limitations endurance  & strength     Decreased / limitations  motor skills  & coordination    Pain    Pace /  Speed    Cardiac and/or  respiratory condition    Contra - indicated by diagnosis   Cane/Crutches  []   [x]   []   []   [x]   [x]   [x]   [x]   [x]   []   []    Walker / Rollator  []  NA   []   [x]   []   []   [x]   [x]   [x]   [x]   [x]   []   []     Manual Wheelchair [id number]:  []  NA  []   []   []   []   [x]   []   []   [x]   [x]   []   []    Manual W/C (K0005) with power assist  []  NA  []   []   []   []   [x]   []   []   [x]   [x]   []   []    Scooter  []  NA  []   []   [x]   []   [x]   []   []   [x]   []   []   []    Power Wheelchair: standard joystick  []  NA  [x]   []   []   []   []   []   []   []   []   []   []    Power Wheelchair: alternative controls  [x]  NA  []   []   []   []   []   []   []   []   []   []   []    Summary:  The least costly alternative for independent functional mobility was found to be:    []  Crutch/Cane  []  Walker []  Manual w/c  []  Manual w/c with power assist   []  Scooter   [x]  Power w/c std joystick   []  Power w/c alternative control        []  Requires dependent care mobility device   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[employer] skills are adequate for safe mobility equipment operation  [x]   Yes []   No  Patient is willing and motivated to use recommended mobility equipment  [x]   Yes []   No       []  Patient is unable to safely operate mobility equipment independently and requires dependent care equipment Comments:                      SENSATION and SKIN ISSUES:      Sensation []  Intact  [x]  Impaired []  Absent []  Hyposensate []  Hypersensate  []  Defensiveness  Location(s) of impairment: L leg down from back and below knee on R lower extremity     Pressure Relief Method(s):  [x]  Lean  side to side to offload (without risk of falling)  []   W/C push up (4+ times/hour for 15+ seconds) []  Stand up (without risk of falling)    []  Other: (Describe): Effective pressure relief method(s) above can be performed consistently throughout the day: [] Yes  []  No If not, Why?:   Skin Integrity Risk:       []  Low risk           [x]  Moderate risk            []  High risk  If high risk, explain: Decreased sensation along Left lower extremity places at increased risk for pressure sores and breakedown  Skin Issues/Skin Integrity  Current skin Issues  []  Yes [x]  No [x]  Intact  []   Red area   []   Open area  []  Scar tissue  []  At risk from prolonged sitting  Where: History of Skin Issues  []  Yes [x]  No Where : When: Stage: Hx of skin flap surgeries  []  Yes [x]  No Where:  When:  Pain: [x]  Yes []  No   Pain Location(s): both hands cramp and lock up, low back, and L leg and hip Intensity scale: (0-10): 10/10 How does pain interfere with mobility and/or MRADLs? - increases falls risk particularly when getting up in the morning and in pain        MAT EVALUATION:     Neuro-Muscular Status: (Tone, Reflexive, Responses, etc.)     [x]   Intact   []  Spasticity:  []  Hypotonicity  []  Fluctuating  []  Muscle Spasms  []  Poor Righting Reactions/Poor Equilibrium Reactions  []  Primal Reflex(s):     Comments:   Grossly WFL however, notable guarding of LLE; would not allow therapist to move through full ROM due to pain, Sits with legs swept to the right with hip internally rotated and knee extended          COMMENTS:     POSTURE:         Comments:  Pelvis Anterior/Posterior:   []  Neutral   [x]  Posterior  []  Anterior   []  Fixed - No movement [x]  Tendency away from neutral [x]  Flexible [x]  Self-correction []  External correction Obliquity (viewed from front)   []  WFL []  R Obliquity [x]  L Obliquity   []  Fixed - No movement [x]  Tendency away from neutral [x]  Flexible [x]   Self-correction []  External correction Rotation   [x]  WFL []  R anterior []  L anterior   []  Fixed - No movement []  Tendency away from neutral []  Flexible []  Self-correction []  External correction Tonal Influence Pelvis:   [x]  Normal []  Flaccid []  Low tone []  Spasticity []  Dystonia []  Pelvis thrust []  Other:     Trunk Anterior/Posterior:   []  WFL [x]  Thoracic kyphosis []  Lumbar lordosis   []  Fixed - No movement [x]  Tendency away from neutral []  Flexible []  Self-correction []  External correction   [x]  WFL []  Convex to left  []  Convex to right []  S-curve   []  C-curve []  Multiple curves []  Tendency away from neutral []  Flexible []  Self-correction []  External correction Rotation of shoulders and upper trunk:   []  Neutral [x]  Left-anterior [x]  Right- anterior []  Fixed- no movement [x]  Tendency away from neutral [x]  Flexible [x]  Self correction []  External correction Tonal influence Trunk:   [x]  Normal []  Flaccid []  Low tone []  Spasticity []  Dystonia []  Other:   Head & Neck   [x]  Functional []  Flexed    []  Extended []  Rotated right  []  Rotated left []  Laterally flexed right []  Laterally flexed left []  Cervical hyperextension     [x]  Good head control []  Adequate head control []  Limited head control []  Absent head control Describe tone/movement of head and neck: [facility]        Lower Extremity Measurements: LE ROM:   Active ROM Right 01/05/2023 Left 01/05/2023  Hip flexion WFL 100 degrees, would not allow therapist or self to move farther due to pain  Hip extension      Hip abduction      Hip adduction      Knee flexion WFL 60 degrees limited due to pain, would not allow therapist or self to move farther  Knee extension [facility]  Ankle dorsiflexion  Ankle plantarflexion       (Blank rows = not tested)   LE MMT:   MMT Right 01/05/2023 Left 01/05/2023  Hip flexion 4-/5 Unable to assess due to pain  Hip extension      Hip abduction       Hip adduction      Knee flexion 4-/5 Unable to assess due to pain  Knee extension 4-/5 Unable to assess due to pain  Ankle dorsiflexion 4-/5 Unable to assess due to pain  Ankle plantarflexion       (Blank rows = not tested)   Hip positions:  []  Neutral   []  Abducted   [x]  Adducted  []  Subluxed   []  Dislocated   []  Fixed   []  Tendency away from neutral [x]  Flexible [x]  Self-correction []  External correction     Hip Windswept:[]  Neutral  [x]  Right    []  Left  []  Subluxed   []  Dislocated   []  Fixed   []  Tendency away from neutral [x]  Flexible [x]  Self-correction []  External correction              L leg extended     Foot positioning: ROM Concerns: Dorsiflexed: []  Right   []  Left Plantar flexed: []  Right    [x]  Left Inversion: []  Right    [x]  Left Eversion: []  Right    []  Left   LE Edema: [x]  1+ (Barely detectable impression when finger is pressed into skin) []  2+ (slight indentation. 15 seconds to rebound) []  3+ (deeper indentation. 30 seconds to rebound) []  4+ (>30 seconds to rebound)   UE Measurements:  UPPER EXTREMITY ROM:    Grossly WFL - limited by L rotator cuff injury and intermittent cramping in bilateral hands   UPPER EXTREMITY MMT:   Grossly WFL   Shoulder Posture:   Right Tendency towards Left  []   Functional []    []   Elevation []    []   Depression []    [x]   Protraction [x]    []   Retraction []    [x]   Internal rotation [x]    []   External rotation []    []   Subluxed []      UE Tone: [x]  Normal []  Flaccid []  Low tone []  Spasticity  []  Dystonia []  Other:    UE Edema: Abscent   Wrist/Hand: Handedness: [x]  Right   []  Left   []  NA: Comments:  Right   Left  [x]   WNL [x]    []   Limitations []    []   Contractures []    []   Fisting []    []   Tremors []    []   Weak grasp []    []   Poor dexterity []    []   Hand movement non functional []    []   Paralysis []        GAIT SPEED DURING 10 METER WALKING TEST: 0.4 m/s with AD (SBA) TUG: 29.8 seconds with  SPC (SBA)     MOBILITY BASE RECOMMENDATIONS and JUSTIFICATION:      MOBILITY BASE  JUSTIFICATION   Manufacturer:   Games developer:   [employer]                           Color: Black Seat Width: 18" Seat Depth: 17"   []  Manual mobility base (continue below)   []  Scooter/POV  [x]  Power mobility base    Number of hours per day spent in above selected mobility base: 4 hours day   Typical daily mobility base  use Schedule: Used throughout house particularly for transfers around the house     [x]  is not a safe, functional ambulator  [x]  limitation prevents from completing a MRADL(s) within a reasonable time frame    [x]  limitation places at high risk of morbidity or mortality secondary to  the attempts to perform a    MRADL(s)  [x]  limitation prevents accomplishing a MRADL(s) entirely  [x]  provide independent mobility  [x]  equipment is a lifetime medical need  [x]  walker or cane inadequate  [x]  any type manual wheelchair      inadequate  [x]  scooter/POV inadequate       []  requires dependent mobility             POWER MOBILITY       []  Scooter/POV    []  can safely operate   []  can safely transfer   []  has adequate trunk stability   []  cannot functionally propel  manual wheelchair     [x]  Power mobility base    [x]  non-ambulatory   [x]  cannot functionally propel manual wheelchair   [x]  cannot functionally and safely      operate scooter/POV  [x]  can safely operate power       wheelchair  [x]  home is accessible  [x]  willing to use power wheelchair     Tilt  []  Powered tilt on powered chair  []  Powered tilt on manual chair  []  Manual tilt on manual chair Comments:  []  change position for pressure      []  elief/cannot weight shift   []  change position against      gravitational force on head and      shoulders   []  decrease pain  []  blood pressure management   []  control autonomic dysreflexia  []  decrease respiratory distress  []  management of spasticity  []  management  of low tone  []  facilitate postural control   []  rest periods   []  control edema  []  increase sitting tolerance   []  aid with transfers            Recline   []  Power recline on power chair  []  Manual recline on manual chair  Comments:    []  intermittent catheterization  []  manage spasticity  []  accommodate femur to back angle  []  change position for pressure relief/cannot weight shift rhigh risk of pressure sore development  []  tilt alone does not accomplish     effective pressure relief, maximum pressure relief achieved at -      _______ degrees tilt   _______ degrees recline    []  difficult to transfer to and from bed []  rest periods and sleeping in chair  []  repositioning for transfers  []  bring to full recline for ADL care  []  clothing/diaper changes in chair  []  gravity PEG tube feeding  []  head positioning  []  decrease pain  []  blood pressure management   []  control autonomic dysreflexia  []  decrease respiratory distress  []  user on ventilator     Elevator on mobility base  []  Power wheelchair  []  Scooter  []  increase Indep in transfers   []  increase Indep in ADLs    []  bathroom function and safety  []  kitchen/cooking function and safety  []  shopping  []  raise height for communication at standing level  []  raise height for eye contact which reduces cervical neck strain and pain  []  drive at raised height for safety and navigating crowds  []  Other:   []  Vertical  position system  (anterior tilt)     (Drive locks-out)    []  Stand       (Drive enabled)  []  independent weight bearing  []  decrease joint contractures  []  decrease/manage spasticity  []  decrease/manage spasms  []  pressure distribution away from   scapula, sacrum, coccyx, and ischial tuberosity  []  increase digestion and elimination   []  access to counters and cabinets  []  increase reach  []  increase interaction with others at eye level, reduces neck strain  []  increase performance of        MRADL(s)       Power elevating legrest    []  Center mount (Single) 85-170 degrees       []  Standard (Pair) 100-170 degrees  []  position legs at 90 degrees, not available with std power ELR  []  center mount tucks into chair to decrease turning radius in home, not available with std power ELR  []  provide change in position for LE  []  elevate legs during recline    []  maintain placement of feet on      footplate  []  decrease edema  []  improve circulation  []  actuator needed to elevate legrest  []  actuator needed to articulate legrest preventing knees from flexing  []  Increase ground clearance over      curbs  []   STD (pair) independently                     elevate legrest   POWER WHEELCHAIR CONTROLS       Controls/input device  []  Expandable  []  Non-expandable  []  Proportional  []  Right Hand []  Left Hand  []  Non-proportional/switches/head-array  []  Electrical/proximity         []   Mechanical      Manufacturer:___________________   Type:________________________ []  provides access for controlling wheelchair  []  programming for accurate control  []  progressive disease/changing condition  []  required for alternative drive      controls       []  lacks motor control to operate  proportional drive control  []  unable to understand proportional controls  []  limited movement/strength  []  extraneous movement / tremors / ataxic / spastic       []  Upgraded electronics controller/harness    []  Single power (tilt or recline)   []  Expandable    []  Non-expandable plus   []  Multi-power (tilt, recline, power legrest, power seat lift, vertical positioning system, stand)  []  allows input device to communicate with drive motors  []  harness provides necessary connections between the controller, input device, and seat functions      []  needed in order to operate power seat functions through joystick/ input device  []  required for alternative drive controls     []  Enhanced display  []  required  to connect all alternative drive controls   []  required for upgraded joystick      (lite-throw, heavy duty, micro)  []  Allows user to see in which mode and drive the wheelchair is set; necessary for alternate controls       []  Upgraded tracking electronics  []  correct tracking when on uneven surfaces makes switch driving more efficient and less fatiguing  []  increase safety when driving  []  increase ability to traverse thresholds    []  Safety / reset / mode switches     Type:    []  Used to change modes and stop the wheelchair when driving     [x]  [facility] for joystick / input device/switches  [  x] swing away for access or transfers   []  attaches joystick / input device / switches to wheelchair   []  provides for consistent access  []  midline for optimal placement     []  Attendant controlled joystick plus     mount  []  safety  []  long distance driving  []  operation of seat functions  []  compliance with transportation regulations     [x]  Battery  [x]  required to power (power assist / scooter/ power wc / other):   []  Power inverter (24V to 12V)  []  required for ventilator / respiratory equipment / other:          CHAIR OPTIONS MANUAL & POWER       Armrests   [x]  adjustable height []  removable  []  swing away []  fixed  [x]  flip back  []  reclining  [x]  full length pads []  desk []  tube arms []  gel pads  [x]  provide support with elbow at 90    [x]  remove/flip back/swing away for transfers  [x]  provide support and positioning of upper body    [x]  allow to come closer to table top  [x]  remove for access to tables  []  provide support for w/c tray  []  change of height/angles for variable activities   []  Elbow support / Elbow stop  []  keep elbow positioned on arm pad  []  keep arms from falling off arm pad  during tilt and/or recline   Upper Extremity Support  []  Arm trough  []   R  []   L  Style:  []  swivel mount []  fixed mount   []  posterior hand support  []   tray  []  full tray  []  joystick  cut out  []   R  []   L  Style:  []  decrease gravitational pull on      shoulders  []  provide support to increase UE  function  []  provide hand support in natural    position  []  position flaccid UE  []  decrease subluxation    []  decrease edema       []  manage spasticity   []  provide midline positioning  []  provide work surface  []  placement for AAC/ Computer/ EADL             Hangers/ Legrests   []  ______ degree  [x]  Elevating []  articulating  [x]  swing away []  fixed []  lift off  []  heavy duty  []  adjustable knee angle  []  adjustable calf panel   []  longer extension tube              [x]  provide LE support  [x]  maintain placement of feet on      footplate   []  accommodate lower leg length  []  accommodate to hamstring       tightness  [x]  enable transfers  []  provide change in position for LE's  [x]  elevate legs during recline    [x]  decrease edema  []  durability       Foot support   [x]  footplate [x]  R [x]  L [x]  flip up           []  Depth adjustable   []  angle adjustable  []  foot board/one piece    [x]  provide foot support  [x]  accommodate to ankle ROM  [x]  allow foot to go under wheelchair base  [x]  enable transfers      []  Shoe holders  []  position foot    []  decrease / manage spasticity  []  control position of LE  []   stability             []  safety      []  Ankle strap/heel      loops  []  support foot on foot support  []  decrease extraneous movement  []  provide input to heel   []  protect foot     []  Amputee adapter []  R  []  L     Style:                  Size:  []  Provide support for stump/residual extremity     []  Transportation tie-down  []  to provide crash tested tie-down brackets    []  Crutch/cane holder    []  O2 holder    []  IV hanger   []  Ventilator tray/mount    []  stabilize accessory on wheelchair       Component  Justification                []  Seat cushion - Captain                []  accommodate impaired sensation  []  decubitus ulcers present or  history  []  unable to shift weight  []  increase pressure distribution  []  prevent pelvic extension  []  custom required "off-the-shelf"    seat cushion will not accommodate deformity  []  stabilize/promote pelvis alignment  []  stabilize/promote femur alignment  []  accommodate obliquity  []  accommodate multiple deformity  []  incontinent/accidents  []  low maintenance      []  seat mounts                 []  fixed []  removable  []  attach seat platform/cushion to wheelchair frame    []  Seat wedge              []  provide increased aggressiveness of seat shape to decrease sliding  down in the seat  []  accommodate ROM        []  Cover replacement   []  protect back or seat cushion  []  incontinent/accidents    []  Solid seat / insert     []  support cushion to prevent      hammocking  []  allows attachment of cushion to mobility base    []  Lateral pelvic/thigh/hip     support (Guides)      []  decrease abduction  []  accommodate pelvis  []  position upper legs  []  accommodate spasticity  []  removable for transfers      []  Lateral pelvic/thigh      supports mounts  []  fixed   []  swing-away   []  removable  []  mounts lateral pelvic/thigh supports     []  mounts lateral pelvic/thigh supports swing-away or removable for transfers    []  Medial thigh support (Pommel)  [] decrease adduction  [] accommodate ROM  []  remove for transfers   []  alignment           []  Medial thigh             []  fixed      support mounts      []  swing-away   []  removable  []  mounts medial thigh supports   []  Mounts medial supports swing- away or removable for transfers             Component  Justification   []  Back       []  provide posterior trunk support []  facilitate tone  []  provide lumbar/sacral support []  accommodate deformity  []  support trunk in midline          []   custom required "off-the-shelf" back support will not accommodate deformity   []  provide lateral trunk support []  accommodate or decrease tone                              []  Back mounts  []  fixed  []  removable  []  attach back rest/cushion to wheelchair frame   []  Lateral trunk      supports  []  R []  L  []  decrease lateral trunk leaning  []  accommodate asymmetry       []  contour for increased contact  []  safety    []  control of tone     []  Lateral trunk      supports mounts  []  fixed  []  swing-away   []  removable  []  mounts lateral trunk supports     []  Mounts lateral trunk supports swing-away or removable for transfers   []  Anterior chest      strap, vest     []  decrease forward movement of shoulder  []  decrease forward movement of trunk  []  safety/stability  []  added abdominal support  []  trunk alignment  []  assistance with shoulder control   []  decrease shoulder elevation     []  Headrest                []  provide posterior head support  []  provide posterior neck support  []  provide lateral head support  []  provide anterior head support  []  support during tilt and recline  []  improve feeding           []  improve respiration  []  placement of switches  []  safety    []  accommodate ROM   []  accommodate tone  []  improve visual orientation   []  Headrest           []  fixed []  removable []  flip down      Mounting hardware   []  swing-away laterals/switches  []  mount headrest   []  mounts headrest flip down or  removable for transfers  []  mount headrest swing-away laterals   []  mount switches     []  Neck Support           []  decrease neck rotation  []  decrease forward neck flexion   Pelvic Positioner       []  std hip belt          []  padded hip belt  []  dual pull hip belt  []  four point hip belt  []  stabilize tone  []  decrease falling out of chair  []  prevent excessive extension  []  special pull angle to control      rotation  []  pad for protection over boney   prominence  []  promote comfort     []  Essential needs                   bag/pouch   []  medicines []  special food rorthotics []  clothing changes  []  diapers  []   catheter/hygiene []  ostomy supplies   The above equipment has a life- long use expectancy.  Growth and changes in medical and/or functional conditions would be the exceptions.   SUMMARY:  Why mobility device was selected; include why a lower level device is not appropriate:   ASSESSMENT:   CLINICAL IMPRESSION: Patient is a 82 y.o. female who was seen today for physical therapy evaluation and treatment for PMH of osteoarthritis, low back pain, L rotator cuff repair, degenerative disc disease, and significant  L hip pain. Patient is currently using a mix of SPC and walker to get around house; however, patient reporting multiple near falls. Patient also at a high risk of falls as indicated by TUG speed of  29.8 seconds with SPC and gait speed of 0.4 m/s also indicating falls risk and risk for future hospitalizations. Cane and walker not appropriate to accommodate falls risk. A scooter is not appropriate to accommodate patient postural needs as indicated by lack of knee flexion due to pain as well as will not meet patient's household mobility needs. Rotator cuff injury rules out appropriateness of manual wheelchair. For these reasons, the [facility] Go Chair Med is a medical necessity for the patient as it accomodates mobility needs, decreased falls risk, and will help minimize pain. Patient will also benefit from leg rest elevators to accommodate reduced LLE ROM knees given patient's severity of pain and address LE edema as well. The [facility] Go Chair Med also breaks down into component parts making it more easy to transport into the community: a feature that patient's daughter and patient verbalize is one they are looking for as they are hoping to store the chair in their trunk for community access. In short, power wheelchair is medical necessity to maximize patient's independence and function.    OBJECTIVE IMPAIRMENTS decreased balance, decreased mobility, difficulty walking, decreased ROM, decreased strength,  impaired sensation, impaired UE functional use, and pain.    ACTIVITY LIMITATIONS carrying, stairs, transfers, and locomotion level   PARTICIPATION LIMITATIONS: meal prep, cleaning, laundry, community activity, and yard work   PERSONAL FACTORS Age and 3+ comorbidities: see above  are also affecting patient's functional outcome.      CLINICAL DECISION MAKING: Stable/uncomplicated   EVALUATION COMPLEXITY: Low                                    GOALS: One time visit. No goals established.   PLAN: PT FREQUENCY: one time visit   Carmelia Bake, PT, DPT 01/05/2023, 3:05 PM          The patient does have mobility limitations and cannot perform food preparation and moving from room to room and toileting. A power wheelchair would let her perform these activities without assistance as she currently requires.   A cane will not help with mobility due to risk of falls. A walker will not help with mobility due to weakness this will cause a risk of falls.   My patient does not have sufficient upper body strength to propel a manual wheelchair due to severe back arthritis which limits her strength.   The patient cannot safely use a scooter in the home due to this will not resolve her mobility limitations safely without risk of fall and unable to maintain postural stability on the scooter.   My patient does have the mental and physical capabilities to use a power wheelchair.   My patient can safely transfer on and off the power wheelchair.   Using the power wheelchair will significantly improve my patient's ability to participate in MRADLs in the home on a daily basis as she will use this daily and it will improve this ability as stated above. She will be able to prepare foods currently she is unable to stand long enough to prepare food but could prepare from the power wheelchair and move independently between rooms and to the toilet.   Objective measurements agree with PT  assessment above and see the measurements.   My patient's mobility has not changed since assessment 01/05/23 and this assessment is still accurate and power wheelchair is needed for quality of life to improve MDADLs.   Allergies as of 03/26/2023       Reactions   Hydrocodone-acetaminophen Nausea And Vomiting   Tramadol Itching        Medication List        Accurate as of March 26, 2023  8:53 AM. If you have any questions, ask your nurse or doctor.          acetaminophen 500 MG tablet Commonly known as: TYLENOL Take 1,000 mg by mouth every 6 (six) hours as needed for moderate pain.   albuterol 108 (90 Base) MCG/ACT inhaler Commonly known as: VENTOLIN HFA Inhale 2 puffs into the lungs every 6 (six) hours as needed for wheezing or shortness of breath.   aspirin 81 MG chewable tablet Commonly known as: [employer] Aspirin Adult Low Dose Chew 1 tablet (81 mg total) by mouth daily.   calcium-vitamin D 500-200 MG-UNIT tablet Commonly known as: OSCAL WITH D Take 1 tablet by mouth daily with breakfast.   cetirizine 10 MG tablet Commonly known as: ZYRTEC TAKE 1 TABLET BY MOUTH EVERY DAY   dextromethorphan 30 MG/5ML liquid Commonly known as: Delsym Take 5 mLs (30 mg total) by mouth 2 (two) times daily.   estradiol 0.5 MG tablet Commonly known as: ESTRACE Take by mouth.   fluticasone 50 MCG/ACT nasal spray Commonly known as: FLONASE PLACE 2 SPRAYS INTO BOTH NOSTRILS DAILY. SHAKE LIQUID AND USE 2 SPRAYS IN EACH NOSTRIL DAILY   guaiFENesin-codeine 100-10 MG/5ML syrup Take 5 mLs by mouth every 6 (six) hours as needed for cough (Use also prior to MRI to reduce cough).   latanoprost 0.005 % ophthalmic solution Commonly known as: XALATAN Place 1 drop into both eyes at bedtime.   meloxicam 15 MG tablet Commonly known as: MOBIC TAKE 1 TABLET BY MOUTH EVERY DAY   omeprazole 40 MG capsule Commonly known as: PRILOSEC TAKE 1 CAPSULE BY MOUTH TWICE DAILY 30 MINUTES BEFORE FIRST  AND LAST MEAL OF THE DAY   oxybutynin 5 MG 24 hr tablet Commonly known as: DITROPAN-XL TAKE 1 TABLET BY MOUTH EVERYDAY AT BEDTIME   potassium chloride SA 20 MEQ tablet Commonly known as: Klor-Con M20 TAKE 1 TABLET BY MOUTH EVERY DAY   predniSONE 10 MG tablet Commonly known as: DELTASONE 3 tabs by mouth per day for 3 days,2tabs per day for 3 days,1tab per day for 3 days   spironolactone 50 MG tablet Commonly known as: ALDACTONE TAKE 1 TABLET BY MOUTH EVERY DAY   triamcinolone cream 0.1 % Commonly known as: KENALOG APPLY TO AFFECTED AREA TWICE A DAY   Vitamin D3 50 MCG (2000 UT) Tabs Take 2,000 Units by mouth daily.       Review of Systems  Constitutional: Negative.   HENT: Negative.    Eyes: Negative.   Respiratory:  Positive for cough. Negative for chest tightness and shortness of breath.   Cardiovascular:  Negative for chest pain, palpitations and leg swelling.  Gastrointestinal:  Negative for abdominal distention, abdominal pain, constipation, diarrhea, nausea and vomiting.  Musculoskeletal:  Positive for arthralgias, gait problem and myalgias.  Skin: Negative.   Neurological:  Positive for numbness.  Psychiatric/Behavioral: Negative.      Objective:  Physical Exam Constitutional:      Appearance: Normal appearance.  HENT:     Head: Normocephalic.  Cardiovascular:     Rate and Rhythm: Normal rate and regular rhythm.  Pulmonary:     Effort: Pulmonary effort is normal.  Musculoskeletal:        General: Normal range of motion.  Skin:    General: Skin is warm and dry.  Neurological:     General: No focal deficit present.     Mental Status: She is alert and oriented to person, place, and time.     Motor: Weakness present.     Coordination: Coordination abnormal.     Vitals:   03/26/23 0830 03/26/23 0833  BP: (!) 138/100 (!) 138/100  Pulse: 71   Temp: 97.8 F (36.6 C)   TempSrc: Oral   SpO2: 98%   Weight: 180 lb (81.6 kg)   Height: 5\' 1"  (1.549 m)      Assessment & Plan:  Visit time 25 minutes in face to face communication with patient and coordination of care, additional 5 minutes spent in record review, coordination or care, ordering tests, communicating/referring to other healthcare professionals, documenting in medical records all on the same day of the visit for total time 30 minutes spent on the visit.

## 2023-03-26 NOTE — Assessment & Plan Note (Signed)
She has significant MRADL deficiency which can be improved with power wheelchair. I will prescribe this and complete forms as needed by home health. PT assessment is done and I agree with findings and have confirmed no change in condition since evaluation.

## 2023-04-05 ENCOUNTER — Encounter: Payer: Self-pay | Admitting: Internal Medicine

## 2023-04-05 ENCOUNTER — Ambulatory Visit: Payer: Medicare PPO | Admitting: Internal Medicine

## 2023-04-05 VITALS — BP 120/80 | HR 64 | Temp 98.0°F | Ht 61.0 in | Wt 180.0 lb

## 2023-04-05 DIAGNOSIS — R052 Subacute cough: Secondary | ICD-10-CM

## 2023-04-05 MED ORDER — METHYLPREDNISOLONE ACETATE 40 MG/ML IJ SUSP
40.0000 mg | Freq: Once | INTRAMUSCULAR | Status: AC
Start: 2023-04-05 — End: 2023-04-05
  Administered 2023-04-05: 40 mg via INTRAMUSCULAR

## 2023-04-05 MED ORDER — BENZONATATE 200 MG PO CAPS: 200.0000 mg | ORAL_CAPSULE | Freq: Three times a day (TID) | ORAL | 0 refills | Status: DC | PRN

## 2023-04-05 MED ORDER — AZITHROMYCIN 250 MG PO TABS: ORAL_TABLET | ORAL | 0 refills | Status: AC

## 2023-04-05 NOTE — Assessment & Plan Note (Signed)
Suspect infection given worsening course and mild SOB with rhonchi. Rx azithromycin and cough medicine. Given depo-medrol 40 mg IM at visit to help as well.

## 2023-04-05 NOTE — Patient Instructions (Signed)
We have given you the steroid shot today.  We have sent in azithromycin to take 2 pills today, then starting tomorrow take 1 pill daily.  We have sent in the tessalon perles to take up to 3 times a day.

## 2023-04-05 NOTE — Progress Notes (Signed)
Subjective:   Patient ID: Catherine Hoffman, female    DOB: 1941/03/21, 82 y.o.   MRN: [id number]  HPI The patient is an 82 YO female coming in coming in for sinuses and going on for 2 months at this time. Using flonase and zyrtec without relief. We started singulair about 10 days ago and this has not helped much. Cough and sinuses. Cough is productive and some SOB.   Review of Systems  Constitutional:  Positive for activity change and appetite change. Negative for chills, fatigue, fever and unexpected weight change.  HENT:  Positive for congestion, postnasal drip, rhinorrhea and sinus pressure. Negative for ear discharge, ear pain, sinus pain, sneezing, sore throat, tinnitus, trouble swallowing and voice change.   Eyes: Negative.   Respiratory:  Positive for cough. Negative for chest tightness, shortness of breath and wheezing.   Cardiovascular: Negative.   Gastrointestinal: Negative.   Musculoskeletal:  Positive for myalgias.  Neurological: Negative.     Objective:  Physical Exam Constitutional:      Appearance: She is well-developed.  HENT:     Head: Normocephalic and atraumatic.     Comments: Oropharynx with redness and clear drainage, nose with swollen turbinates, TMs normal bilaterally.  Neck:     Thyroid: No thyromegaly.  Cardiovascular:     Rate and Rhythm: Normal rate and regular rhythm.  Pulmonary:     Effort: Pulmonary effort is normal. No respiratory distress.     Breath sounds: Rhonchi present. No wheezing or rales.  Abdominal:     Palpations: Abdomen is soft.  Musculoskeletal:        General: Tenderness present.     Cervical back: Normal range of motion.  Lymphadenopathy:     Cervical: No cervical adenopathy.  Skin:    General: Skin is warm and dry.  Neurological:     Mental Status: She is alert and oriented to person, place, and time.     Vitals:   04/05/23 0951  BP: 120/80  Pulse: 64  Temp: 98 F (36.7 C)  TempSrc: Oral  SpO2: 99%  Weight: 180 lb  (81.6 kg)  Height: 5\' 1"  (1.549 m)    Assessment & Plan:  Depo-medrol 40 mg IM given at visit

## 2023-04-27 ENCOUNTER — Other Ambulatory Visit: Payer: Self-pay | Admitting: Internal Medicine

## 2023-05-04 ENCOUNTER — Ambulatory Visit (INDEPENDENT_AMBULATORY_CARE_PROVIDER_SITE_OTHER): Payer: Medicare PPO | Admitting: *Deleted

## 2023-05-04 DIAGNOSIS — Z0000 Encounter for general adult medical examination without abnormal findings: Secondary | ICD-10-CM

## 2023-05-04 NOTE — Progress Notes (Signed)
Subjective:   Catherine Hoffman is a 82 y.o. female who presents for Medicare Annual (Subsequent) preventive examination.  Visit Complete: Virtual I connected with  [name] on 05/04/23 by a audio enabled telemedicine application and verified that I am speaking with the correct person using two identifiers.  Patient Location: Home  Provider Location: Home Office  I discussed the limitations of evaluation and management by telemedicine. The patient expressed understanding and agreed to proceed.  Vital Signs: Because this visit was a virtual/telehealth visit, some criteria may be missing or patient reported. Any vitals not documented were not able to be obtained and vitals that have been documented are patient reported.  Cardiac Risk Factors include: advanced age (>28men, >64 women);hypertension;sedentary lifestyle     Objective:    Today's Vitals   05/04/23 1014  PainSc: 6    There is no height or weight on file to calculate BMI.     05/04/2023   10:21 AM 12/04/2022    3:55 PM 05/21/2022    3:58 PM 09/05/2019    2:00 PM 08/29/2019   11:12 AM 08/12/2017   10:26 AM 03/07/2016    4:03 PM  Advanced Directives  Does Patient Have a Medical Advance Directive? No Yes No No No No No  Type of Advance Directive  Living will       Does patient want to make changes to medical advance directive?  No - Patient declined       Would patient like information on creating a medical advance directive? No - Patient declined  No - Patient declined No - Patient declined No - Patient declined Yes (ED - Information included in AVS)     Current Medications (verified) Outpatient Encounter Medications as of 05/04/2023  Medication Sig   acetaminophen (TYLENOL) 500 MG tablet Take 1,000 mg by mouth every 6 (six) hours as needed for moderate pain.    albuterol (VENTOLIN HFA) 108 (90 Base) MCG/ACT inhaler Inhale 2 puffs into the lungs every 6 (six) hours as needed for wheezing or shortness of breath.    aspirin ([employer] ASPIRIN ADULT LOW DOSE) 81 MG chewable tablet Chew 1 tablet (81 mg total) by mouth daily.   benzonatate (TESSALON) 200 MG capsule Take 1 capsule (200 mg total) by mouth 3 (three) times daily as needed.   calcium-vitamin D (OSCAL WITH D) 500-200 MG-UNIT per tablet Take 1 tablet by mouth daily with breakfast.   cetirizine (ZYRTEC) 10 MG tablet TAKE 1 TABLET BY MOUTH EVERY DAY   Cholecalciferol (VITAMIN D3) 50 MCG (2000 UT) TABS Take 2,000 Units by mouth daily.    dextromethorphan (DELSYM) 30 MG/5ML liquid Take 5 mLs (30 mg total) by mouth 2 (two) times daily.   estradiol (ESTRACE) 0.5 MG tablet Take by mouth.   fluticasone (FLONASE) 50 MCG/ACT nasal spray PLACE 2 SPRAYS INTO BOTH NOSTRILS DAILY. SHAKE LIQUID AND USE 2 SPRAYS IN EACH NOSTRIL DAILY   guaiFENesin-codeine 100-10 MG/5ML syrup Take 5 mLs by mouth every 6 (six) hours as needed for cough (Use also prior to MRI to reduce cough). (Patient not taking: Reported on 03/26/2023)   latanoprost (XALATAN) 0.005 % ophthalmic solution Place 1 drop into both eyes at bedtime.   meloxicam (MOBIC) 15 MG tablet TAKE 1 TABLET BY MOUTH EVERY DAY   montelukast (SINGULAIR) 10 MG tablet Take 1 tablet (10 mg total) by mouth at bedtime.   omeprazole (PRILOSEC) 40 MG capsule TAKE 1 CAPSULE BY MOUTH TWICE DAILY 30 MINUTES BEFORE FIRST  AND LAST MEAL OF THE DAY   oxybutynin (DITROPAN-XL) 5 MG 24 hr tablet TAKE 1 TABLET BY MOUTH EVERYDAY AT BEDTIME   potassium chloride SA (KLOR-CON M20) 20 MEQ tablet TAKE 1 TABLET BY MOUTH EVERY DAY   spironolactone (ALDACTONE) 50 MG tablet TAKE 1 TABLET BY MOUTH EVERY DAY   triamcinolone cream (KENALOG) 0.1 % APPLY TO AFFECTED AREA TWICE A DAY   No facility-administered encounter medications on file as of 05/04/2023.    Allergies (verified) Hydrocodone-acetaminophen and Tramadol   History: Past Medical History:  Diagnosis Date   Arthritis    Cataract    removed both eyes    Episodic tension type headache     Esophageal reflux    GERD (gastroesophageal reflux disease)    Glaucoma    Heart murmur    as young adult   Hyperlipidemia    Hypertension    Internal hemorrhoids    Kidney stone on left side Hx-date unknown   Left rotator cuff tear    Past Surgical History:  Procedure Laterality Date   COLONOSCOPY  11/20/2008   COLONOSCOPY     KNEE ARTHROSCOPY Left 2004   SHOULDER OPEN ROTATOR CUFF REPAIR Left 08/30/2012   Procedure: ROTATOR CUFF REPAIR SHOULDER OPEN, POSSIBLE GRAFT AND ANCHORS;  Surgeon: Jacki Cones, MD;  Location: [facility];  Service: Orthopedics;  Laterality: Left;   TOTAL ABDOMINAL HYSTERECTOMY W/ BILATERAL SALPINGOOPHORECTOMY  1990's   TOTAL KNEE ARTHROPLASTY Right 09/05/2019   Procedure: RIGHT TOTAL KNEE ARTHROPLASTY;  Surgeon: Marcene Corning, MD;  Location: [facility];  Service: Orthopedics;  Laterality: Right;   Family History  Problem Relation Age of Onset   Hypertension Mother    Diabetes Mother    Cancer Mother        in leg   Varicose Veins Maternal Uncle    Colon cancer Neg Hx    Colon polyps Neg Hx    Esophageal cancer Neg Hx    Stomach cancer Neg Hx    Rectal cancer Neg Hx    Social History   Socioeconomic History   Marital status: Widowed    Spouse name: Not on file   Number of children: 2   Years of education: Not on file   Highest education level: Not on file  Occupational History   Occupation: Lexicographer  Tobacco Use   Smoking status: Never   Smokeless tobacco: Never  Vaping Use   Vaping status: Never Used  Substance and Sexual Activity   Alcohol use: No   Drug use: No   Sexual activity: Never  Other Topics Concern   Not on file  Social History Narrative   Not on file   Social Determinants of Health   Financial Resource Strain: Low Risk  (05/04/2023)   Overall Financial Resource Strain (CARDIA)    Difficulty of Paying Living Expenses: Not hard at all  Food Insecurity: No Food Insecurity (05/04/2023)   Hunger Vital  Sign    Worried About Running Out of Food in the Last Year: Never true    Ran Out of Food in the Last Year: Never true  Transportation Needs: Unmet Transportation Needs (05/04/2023)   PRAPARE - Transportation    Lack of Transportation (Medical): Yes    Lack of Transportation (Non-Medical): Yes  Physical Activity: Inactive (05/04/2023)   Exercise Vital Sign    Days of Exercise per Week: 0 days    Minutes of Exercise per Session: 0 min  Stress: No Stress Concern  Present (05/04/2023)   [employer] of Occupational Health - Occupational Stress Questionnaire    Feeling of Stress : Not at all  Social Connections: Moderately Integrated (05/04/2023)   Social Connection and Isolation Panel [NHANES]    Frequency of Communication with Friends and Family: More than three times a week    Frequency of Social Gatherings with Friends and Family: Twice a week    Attends Religious Services: More than 4 times per year    Active Member of [employer] or Organizations: Yes    Attends Banker Meetings: 1 to 4 times per year    Marital Status: Widowed    Tobacco Counseling Counseling given: Not Answered   Clinical Intake:  Pre-visit preparation completed: Yes  Pain : 0-10 Pain Score: 6  Pain Type: Chronic pain Pain Location: Leg Pain Orientation: Left Pain Descriptors / Indicators: Burning, Constant, Aching, Sharp, Tingling Pain Onset: More than a month ago Pain Frequency: Constant Pain Relieving Factors: tylenol,mobic  Pain Relieving Factors: tylenol,mobic  Diabetes: No  How often do you need to have someone help you when you read instructions, pamphlets, or other written materials from your doctor or pharmacy?: 1 - Never  Interpreter Needed?: No  Information entered by :: Remi Haggard LPN   Activities of Daily Living    05/04/2023   10:20 AM 05/21/2022    4:00 PM  In your present state of health, do you have any difficulty performing the following activities:   Hearing? 0 0  Vision? 0 0  Difficulty concentrating or making decisions? 0 0  Walking or climbing stairs? 1 0  Dressing or bathing? 1 0  Doing errands, shopping? 1 0  Preparing Food and eating ? N N  Using the Toilet? N N  In the past six months, have you accidently leaked urine? Y N  Do you have problems with loss of bowel control? N N  Managing your Medications? N N  Managing your Finances? N N  Housekeeping or managing your Housekeeping? Y N    Patient Care Team: Myrlene Broker, MD as PCP - General (Internal Medicine)  Indicate any recent Medical Services you may have received from other than [facility] providers in the past year (date may be approximate).     Assessment:   This is a routine wellness examination for Catherine Hoffman.  Hearing/Vision screen Hearing Screening - Comments:: No trouble hearing Vision Screening - Comments:: Not up to date   Goals Addressed             This Visit's Progress    Patient Stated       Continue current lifestyle       Depression Screen    05/04/2023   10:18 AM 01/27/2023    3:05 PM 10/13/2022    3:52 PM 07/27/2022    1:09 PM 06/10/2022   10:42 AM 05/21/2022    4:00 PM 08/05/2021   10:00 AM  PHQ 2/9 Scores  PHQ - 2 Score 0 0 0 0 0 0 0  PHQ- 9 Score 4  0 0 0      Fall Risk    05/04/2023   10:19 AM 04/05/2023    9:53 AM 01/27/2023    3:04 PM 10/13/2022    3:52 PM 07/27/2022    1:09 PM  Fall Risk   Falls in the past year? 0  0 0 0  Number falls in past yr: 0 0 0 0 0  Injury with Fall? 0 0 0 0  0  Risk for fall due to : Impaired mobility;Impaired balance/gait  No Fall Risks    Follow up Falls evaluation completed;Education provided;Falls prevention discussed Falls evaluation completed Falls evaluation completed Falls evaluation completed Falls evaluation completed    MEDICARE RISK AT HOME: Medicare Risk at Home Any stairs in or around the home?: No If so, are there any without handrails?: No Home free of loose throw rugs in  walkways, pet beds, electrical cords, etc?: Yes Adequate lighting in your home to reduce risk of falls?: Yes Life alert?: No Use of a cane, walker or w/c?: Yes Grab bars in the bathroom?: No Shower chair or bench in shower?: No Elevated toilet seat or a handicapped toilet?: No  TIMED UP AND GO:  Was the test performed?  No    Cognitive Function:    08/12/2017   10:34 AM  MMSE - Mini Mental State Exam  Orientation to time 5  Orientation to Place 5  Registration 3  Attention/ Calculation 4  Recall 2  Language- name 2 objects 2  Language- repeat 1  Language- follow 3 step command 3  Language- read & follow direction 1  Write a sentence 1  Copy design 1  Total score 28        05/04/2023   10:22 AM 05/21/2022    4:00 PM  6CIT Screen  What Year? 0 points 0 points  What month? 0 points 0 points  What time? 0 points 0 points  Count back from 20 0 points 0 points  Months in reverse 2 points 0 points  Repeat phrase 2 points 0 points  Total Score 4 points 0 points    Immunizations Immunization History  Administered Date(s) Administered   Fluad Quad(high Dose 65+) 03/09/2019, 08/01/2020, 03/08/2021, 03/10/2022, 03/24/2023   Influenza Split 05/30/2012   Influenza Whole 07/08/2009, 02/26/2010, 03/09/2011   Influenza, High Dose Seasonal PF 03/09/2017, 03/17/2018   Influenza,inj,Quad PF,6+ Mos 02/07/2013, 08/17/2014, 02/27/2015, 01/30/2016   PFIZER Comirnaty(Gray Top)Covid-19 Tri-Sucrose Vaccine 11/26/2020   PFIZER(Purple Top)SARS-COV-2 Vaccination 07/13/2019, 08/03/2019, 03/28/2020   Pfizer Covid-19 Vaccine Bivalent Booster 25yrs & up 03/26/2021   Pfizer(Comirnaty)Fall Seasonal Vaccine 12 years and older 03/17/2023   Pneumococcal Conjugate-13 08/17/2014   Pneumococcal Polysaccharide-23 07/26/2015   Td 02/25/2009   Tdap 05/25/2016      Flu Vaccine status: Due, Education has been provided regarding the importance of this vaccine. Advised may receive this vaccine at local  pharmacy or Health Dept. Aware to provide a copy of the vaccination record if obtained from local pharmacy or Health Dept. Verbalized acceptance and understanding.  Pneumococcal vaccine status: Up to date  Covid-19 vaccine status: Completed vaccines  Qualifies for Shingles Vaccine? Yes   Zostavax completed No   Shingrix Completed?: No.    Education has been provided regarding the importance of this vaccine. Patient has been advised to call insurance company to determine out of pocket expense if they have not yet received this vaccine. Advised may also receive vaccine at local pharmacy or Health Dept. Verbalized acceptance and understanding.  Screening Tests Health Maintenance  Topic Date Due   Zoster Vaccines- Shingrix (1 of 2) 08/04/2023 (Originally [birth year]/1991)   DEXA SCAN  05/03/2024 (Originally 01/30/2006)   COVID-19 Vaccine (7 - 2023-24 season) 07/18/2023   Medicare Annual Wellness (AWV)  05/03/2024   DTaP/Tdap/Td (3 - Td or Tdap) 05/25/2026   Pneumonia Vaccine 58+ Years old  Completed   INFLUENZA VACCINE  Completed   HPV VACCINES  Aged Out  Health Maintenance  There are no preventive care reminders to display for this patient.   Colorectal cancer screening: No longer required.   Mammogram status: No longer required due to age.  Bone Density   declined  Lung Cancer Screening: (Low Dose CT Chest recommended if Age 67-80 years, 20 pack-year currently smoking OR have quit w/in 15years.) does not qualify.   Lung Cancer Screening Referral:  Additional Screening:  Hepatitis C Screening  never done  Vision Screening: Recommended annual ophthalmology exams for early detection of glaucoma and other disorders of the eye. Is the patient up to date with their annual eye exam?  No  Who is the provider or what is the name of the office in which the patient attends annual eye exams? Is calling to schedule If pt is not established with a provider, would they like to be referred to a  provider to establish care? No .   Dental Screening: Recommended annual dental exams for proper oral hygiene    Community Resource Referral / Chronic Care Management: CRR required this visit?  No   CCM required this visit?  No     Plan:     I have personally reviewed and noted the following in the patient's chart:   Medical and social history Use of alcohol, tobacco or illicit drugs  Current medications and supplements including opioid prescriptions. Patient is not currently taking opioid prescriptions. Functional ability and status Nutritional status Physical activity Advanced directives List of other physicians Hospitalizations, surgeries, and ER visits in previous 12 months Vitals Screenings to include cognitive, depression, and falls Referrals and appointments  In addition, I have reviewed and discussed with patient certain preventive protocols, quality metrics, and best practice recommendations. A written personalized care plan for preventive services as well as general preventive health recommendations were provided to patient.     Remi Haggard, LPN   [id number]   After Visit Summary: (MyChart) Due to this being a telephonic visit, the after visit summary with patients personalized plan was offered to patient via MyChart   Nurse Notes:

## 2023-05-04 NOTE — Patient Instructions (Signed)
Catherine Hoffman , Thank you for taking time to come for your Medicare Wellness Visit. I appreciate your ongoing commitment to your health goals. Please review the following plan we discussed and let me know if I can assist you in the future.   Screening recommendations/referrals: Colonoscopy: no longer required Mammogram: no longer required Recommended yearly ophthalmology/optometry visit for glaucoma screening and checkup Recommended yearly dental visit for hygiene and checkup  Vaccinations: Influenza vaccine: up to date Pneumococcal vaccine: up to date Tdap vaccine: up to date Shingles vaccine: Education provided    Advanced directives: Education provided      Preventive Care 65 Years and Older, Female Preventive care refers to lifestyle choices and visits with your health care provider that can promote health and wellness. What does preventive care include? A yearly physical exam. This is also called an annual well check. Dental exams once or twice a year. Routine eye exams. Ask your health care provider how often you should have your eyes checked. Personal lifestyle choices, including: Daily care of your teeth and gums. Regular physical activity. Eating a healthy diet. Avoiding tobacco and drug use. Limiting alcohol use. Practicing safe sex. Taking low-dose aspirin every day. Taking vitamin and mineral supplements as recommended by your health care provider. What happens during an annual well check? The services and screenings done by your health care provider during your annual well check will depend on your age, overall health, lifestyle risk factors, and family history of disease. Counseling  Your health care provider may ask you questions about your: Alcohol use. Tobacco use. Drug use. Emotional well-being. Home and relationship well-being. Sexual activity. Eating habits. History of falls. Memory and ability to understand (cognition). Work and work  Astronomer. Reproductive health. Screening  You may have the following tests or measurements: Height, weight, and BMI. Blood pressure. Lipid and cholesterol levels. These may be checked every 5 years, or more frequently if you are over 74 years old. Skin check. Lung cancer screening. You may have this screening every year starting at age 73 if you have a 30-pack-year history of smoking and currently smoke or have quit within the past 15 years. Fecal occult blood test (FOBT) of the stool. You may have this test every year starting at age 84. Flexible sigmoidoscopy or colonoscopy. You may have a sigmoidoscopy every 5 years or a colonoscopy every 10 years starting at age 63. Hepatitis C blood test. Hepatitis B blood test. Sexually transmitted disease (STD) testing. Diabetes screening. This is done by checking your blood sugar (glucose) after you have not eaten for a while (fasting). You may have this done every 1-3 years. Bone density scan. This is done to screen for osteoporosis. You may have this done starting at age 64. Mammogram. This may be done every 1-2 years. Talk to your health care provider about how often you should have regular mammograms. Talk with your health care provider about your test results, treatment options, and if necessary, the need for more tests. Vaccines  Your health care provider may recommend certain vaccines, such as: Influenza vaccine. This is recommended every year. Tetanus, diphtheria, and acellular pertussis (Tdap, Td) vaccine. You may need a Td booster every 10 years. Zoster vaccine. You may need this after age 67. Pneumococcal 13-valent conjugate (PCV13) vaccine. One dose is recommended after age 27. Pneumococcal polysaccharide (PPSV23) vaccine. One dose is recommended after age 37. Talk to your health care provider about which screenings and vaccines you need and how often you need them.  This information is not intended to replace advice given to you by  your health care provider. Make sure you discuss any questions you have with your health care provider. Document Released: 06/21/2015 Document Revised: 02/12/2016 Document Reviewed: 03/26/2015 Elsevier Interactive Patient Education  2017 [employer].  Fall Prevention in the Home Falls can cause injuries. They can happen to people of all ages. There are many things you can do to make your home safe and to help prevent falls. What can I do on the outside of my home? Regularly fix the edges of walkways and driveways and fix any cracks. Remove anything that might make you trip as you walk through a door, such as a raised step or threshold. Trim any bushes or trees on the path to your home. Use bright outdoor lighting. Clear any walking paths of anything that might make someone trip, such as rocks or tools. Regularly check to see if handrails are loose or broken. Make sure that both sides of any steps have handrails. Any raised decks and porches should have guardrails on the edges. Have any leaves, snow, or ice cleared regularly. Use sand or salt on walking paths during winter. Clean up any spills in your garage right away. This includes oil or grease spills. What can I do in the bathroom? Use night lights. Install grab bars by the toilet and in the tub and shower. Do not use towel bars as grab bars. Use non-skid mats or decals in the tub or shower. If you need to sit down in the shower, use a plastic, non-slip stool. Keep the floor dry. Clean up any water that spills on the floor as soon as it happens. Remove soap buildup in the tub or shower regularly. Attach bath mats securely with double-sided non-slip rug tape. Do not have throw rugs and other things on the floor that can make you trip. What can I do in the bedroom? Use night lights. Make sure that you have a light by your bed that is easy to reach. Do not use any sheets or blankets that are too big for your bed. They should not hang  down onto the floor. Have a firm chair that has side arms. You can use this for support while you get dressed. Do not have throw rugs and other things on the floor that can make you trip. What can I do in the kitchen? Clean up any spills right away. Avoid walking on wet floors. Keep items that you use a lot in easy-to-reach places. If you need to reach something above you, use a strong step stool that has a grab bar. Keep electrical cords out of the way. Do not use floor polish or wax that makes floors slippery. If you must use wax, use non-skid floor wax. Do not have throw rugs and other things on the floor that can make you trip. What can I do with my stairs? Do not leave any items on the stairs. Make sure that there are handrails on both sides of the stairs and use them. Fix handrails that are broken or loose. Make sure that handrails are as long as the stairways. Check any carpeting to make sure that it is firmly attached to the stairs. Fix any carpet that is loose or worn. Avoid having throw rugs at the top or bottom of the stairs. If you do have throw rugs, attach them to the floor with carpet tape. Make sure that you have a light switch at the  top of the stairs and the bottom of the stairs. If you do not have them, ask someone to add them for you. What else can I do to help prevent falls? Wear shoes that: Do not have high heels. Have rubber bottoms. Are comfortable and fit you well. Are closed at the toe. Do not wear sandals. If you use a stepladder: Make sure that it is fully opened. Do not climb a closed stepladder. Make sure that both sides of the stepladder are locked into place. Ask someone to hold it for you, if possible. Clearly mark and make sure that you can see: Any grab bars or handrails. First and last steps. Where the edge of each step is. Use tools that help you move around (mobility aids) if they are needed. These  include: Canes. Walkers. Scooters. Crutches. Turn on the lights when you go into a dark area. Replace any light bulbs as soon as they burn out. Set up your furniture so you have a clear path. Avoid moving your furniture around. If any of your floors are uneven, fix them. If there are any pets around you, be aware of where they are. Review your medicines with your doctor. Some medicines can make you feel dizzy. This can increase your chance of falling. Ask your doctor what other things that you can do to help prevent falls. This information is not intended to replace advice given to you by your health care provider. Make sure you discuss any questions you have with your health care provider. Document Released: 03/21/2009 Document Revised: 10/31/2015 Document Reviewed: 06/29/2014 Elsevier Interactive Patient Education  2017 [employer].

## 2023-05-29 DIAGNOSIS — M1712 Unilateral primary osteoarthritis, left knee: Secondary | ICD-10-CM | POA: Diagnosis not present

## 2023-06-16 ENCOUNTER — Ambulatory Visit: Payer: Medicare PPO | Admitting: Family Medicine

## 2023-06-16 VITALS — BP 130/84 | HR 67 | Ht 61.0 in | Wt 177.0 lb

## 2023-06-16 DIAGNOSIS — M48062 Spinal stenosis, lumbar region with neurogenic claudication: Secondary | ICD-10-CM | POA: Diagnosis not present

## 2023-06-16 DIAGNOSIS — M5416 Radiculopathy, lumbar region: Secondary | ICD-10-CM | POA: Diagnosis not present

## 2023-06-16 MED ORDER — GABAPENTIN 100 MG PO CAPS: 100.0000 mg | ORAL_CAPSULE | Freq: Every evening | ORAL | 3 refills | Status: AC | PRN

## 2023-06-16 NOTE — Progress Notes (Signed)
 Catherine Ileana Collet, Catherine Hoffman, Catherine Hoffman, Catherine Hoffman acting as a scribe for Artist Lloyd, MD.  Catherine Hoffman is a 83 y.o. female who presents to [facility] at [facility] today for hip and low back pain w/ MRI review. Pt was last seen by Dr. Lloyd on 11/19/22 and was given a L GT steroid injection and MRI's were ordered. Based on findings, ESI was ordered, done on 02/18/23  Today, pt reports cont'd low back and leg pain. No relief from [facility] in Sept. Pt locates pain to bilat buttocks w/ radiating pain into both legs, somewhat laterally. She also notes a pins and needles type of pain in her legs. Weakness present. She is also having pain along the lateral aspect of her L hip. Pain is disturbing sleep   The worst pain is pain radiating down both legs to the lateral calf left worse than right.  Dx imaging: 01/03/23 L-spine & L hip MRI 11/19/22 L hip XR 07/10/22 L-spine XR  04/22/22 L-spine XR  Pertinent review of systems: No fevers or chills  Relevant historical information: Hypertension   Exam:  BP 130/84   Pulse 67   Ht 5' 1 (1.549 m)   Wt 177 lb (80.3 kg)   SpO2 96%   BMI 33.44 kg/m  General: Well Developed, well nourished, and in no acute distress.   MSK: L-spine decreased lumbar motion.  Lower extremity strength is intact.    Lab and Radiology Results  EXAM: MRI LUMBAR SPINE WITHOUT CONTRAST   TECHNIQUE: Multiplanar, multisequence MR imaging of the lumbar spine was performed. No intravenous contrast was administered.   COMPARISON:  Lumbar MRI 05/14/2019.  Lumbar radiographs 07/10/2022.   FINDINGS: Segmentation:  Normal, same numbering system used in 2020.   Alignment: Levoconvex lumbar scoliosis is mild-to-moderate and is more apparent than on the 2020 MRI. Otherwise stable chronic straightening of lumbar lordosis. There is subtle anterolisthesis of L4 on L5.   Vertebrae: Progressed since 2020 and severe widespread chronic lumbar endplate degeneration including a  chronic Schmorl's nodes and endplate spurring. Intermittent faint superimposed degenerative endplate marrow edema including inferiorly at L2 (series 111, image 10) and L5-S1 (image 7). But background bone marrow signal remains within normal limits. Intact visible sacrum and SI joints. And no other acute osseous abnormality identified.   Conus medullaris and cauda equina: Conus extends to the L1 level. No lower spinal cord or conus signal abnormality.   Paraspinal and other soft tissues: Stable and negative visible abdominal viscera. Paraspinal soft tissues are within normal limits.   Disc levels:   Visible lower thoracic levels through L1-L2 remain normal for age.   L2-L3: Moderate to severe disc space loss and endplate irregularity has progressed since 2020. Bulky circumferential disc or disc osteophyte complex. Chronic epidural lipomatosis. Up to moderate facet and ligament flavum hypertrophy has increased.   Combined there is progressed and severe spinal stenosis (series 117, image 17) with lateral recess stenosis greater on the right (L3 nerve level). Mild left L2 foraminal stenosis is stable but moderate to severe right L2 foraminal stenosis has progressed (series 109, image 6).   L3-L4: Chronic moderate to severe disc space loss with progressed endplate degeneration since 2020. Bulky circumferential disc osteophyte complex eccentric to the right appears increased. Mild to moderate facet and ligament flavum hypertrophy is more stable. Mild epidural lipomatosis persists. Moderate to severe spinal stenosis with fairly symmetric lateral recess stenosis has not significantly changed (series 109, image 9). Mild left L3 foraminal stenosis  is stable. Moderate to severe right L3 foraminal stenosis appears progressed on series 109, image 6.   L4-L5: Better preserved disc space height. Bulky chronic circumferential disc bulge with posterior component eccentric to the left. Moderate  facet and ligament flavum hypertrophy is chronic. Chronic facet joint fluid, decreased. Chronic epidural lipomatosis. Moderate to severe spinal and severe left lateral recess stenosis appears stable (left L5 nerve level). Moderate to severe left L4 foraminal stenosis is stable. Mild to moderate right L4 foraminal stenosis has mildly progressed.   L5-S1: Better preserved disc space height. Bulky circumferential disc or disc osteophyte complex with a broad-based posterior component. Chronic but increased moderate to severe facet and ligament flavum hypertrophy. Chronic degenerative facet joint fluid. Increased moderate spinal and moderate to severe bilateral lateral recess stenosis (S1 nerve levels). Moderate to severe left L5 foraminal stenosis is stable, but moderate to severe right L5 foraminal stenosis has progressed (series 109, image 3).   IMPRESSION: 1. Progressed lumbar disc and endplate degeneration since the 2020 MRI, especially at L2-L3 and L3-L4. Underlying chronic levoconvex lumbar scoliosis. Mild degenerative endplate marrow edema at several levels.   2. Progressed multifactorial spinal stenosis since 2020 is severe at L2-L3 and moderate at L5-S1. Severe lateral recess stenosis at both levels (L3 and S1 nerve levels). Moderate to severe spinal stenosis at L3-L4 and L4-L5 has not significantly changed, with severe left lateral recess stenosis at the left L5 nerve level. Associated progressed moderate to severe neural foraminal stenosis at the right L2, right L3, left L4, and right L5 nerve levels.     Electronically Signed   By: VEAR Hurst M.D.   On: 01/12/2023 08:25  EXAM: MR OF THE LEFT HIP WITHOUT CONTRAST   TECHNIQUE: Multiplanar, multisequence MR imaging was performed. No intravenous contrast was administered.   COMPARISON:  Radiographs 11/19/2022.   FINDINGS: Bones: There is no evidence of acute fracture, dislocation or femoral head osteonecrosis. The  visualized bony pelvis appears normal. Minimal sacroiliac degenerative changes bilaterally. Lumbar spine findings are dictated separately.   Articular cartilage and labrum   Articular cartilage: No focal chondral defect or subchondral signal abnormality identified.   Labrum: There is no gross labral tear or paralabral abnormality.   Joint or bursal effusion   Joint effusion: No significant hip joint effusion.   Bursae: No focal periarticular fluid collection.   Muscles and tendons   Muscles and tendons: Mild gluteus tendinosis bilaterally, right greater than left. No evidence of tendon tear. The common hamstring and iliopsoas tendons are intact. No focal muscular atrophy or edema. The piriformis muscles appear symmetric.   Other findings   Miscellaneous: Status post hysterectomy. The visualized internal pelvic contents are otherwise unremarkable.   IMPRESSION: 1. No acute findings or explanation for the patient's symptoms in the pelvis or hips. 2. Mild gluteus tendinosis bilaterally, right greater than left. 3. Lumbar spine findings are dictated separately.     Electronically Signed   By: Elsie Perone M.D.   On: 01/12/2023 08:13 I, Artist Lloyd, personally (independently) visualized and performed the interpretation of the images attached in this note.  FLUOROSCOPY: Radiation Exposure Index (as provided by the fluoroscopic device): 3.20 mGy Kerma   PROCEDURE: The procedure, risks, benefits, and alternatives were explained to the patient. Questions regarding the procedure were encouraged and answered. The patient understands and consents to the procedure.   LUMBAR EPIDURAL INJECTION:   An interlaminar approach was performed on the left at L4-5. The overlying skin was cleansed and anesthetized.  A 3.5 inch 20 gauge epidural needle was advanced using loss-of-resistance technique.   DIAGNOSTIC EPIDURAL INJECTION   Injection of Isovue -M 200 shows a good epidural  pattern with spread above and below the level of needle placement primarily on the left. No vascular opacification is seen.   THERAPEUTIC EPIDURAL INJECTION:   80 mg of Depo-Medrol  mixed with 2.5 mL of 1% lidocaine  were instilled. The procedure was well-tolerated, and the patient was discharged thirty minutes following the injection in good condition.   COMPLICATIONS: None immediate   IMPRESSION: Technically successful interlaminar epidural injection on the left at L4-5.     Electronically Signed   By: Dasie Hamburg M.D.   On: 02/18/2023 09:52  Assessment and Plan: 83 y.o. female with chronic low back pain radiating down both legs left worse than right.  Pain due to spinal stenosis and lumbar radiculopathy worse at the L5 nerve roots left worse than right.  She had a trial of an interlaminar epidural steroid injection in September at L4-5 which did not help much.  I reviewed her MRIs as well as her remaining options.  I think potentially future epidural steroid injections could be helpful but I am not entirely optimistic.  Will refer to pain management for second opinion regarding further injection options and for future epidural steroid injection options or what ever procedures could be beneficial.  Her remaining options after that would be surgery.  She is 83 years old and a big laminectomy decompression and fusion is less ideal but may be necessary.  Gabapentin  prescribed for temporary pain control especially at bedtime.   PDMP not reviewed this encounter. Orders Placed This Encounter  Procedures   Ambulatory referral to Pain Clinic    Referral Priority:   Routine    Referral Type:   Consultation    Referral Reason:   Specialty Services Required    Referred to Provider:   Darlis Deatrice RAMAN, MD    Requested Specialty:   Pain Medicine    Number of Visits Requested:   1   Meds ordered this encounter  Medications   gabapentin  (NEURONTIN ) 100 MG capsule    Sig: Take 1-3 capsules  (100-300 mg total) by mouth at bedtime as needed (nerve pain).    Dispense:  90 capsule    Refill:  3     Discussed warning signs or symptoms. Please see discharge instructions. Patient expresses understanding.   The above documentation has been reviewed and is accurate and complete Artist Lloyd, M.D.

## 2023-06-16 NOTE — Patient Instructions (Addendum)
 Thank you for coming in today.   You should hear from Dr Kathlene Cote office here in [place].   Let me know if you dont hear from him.   Next step after that would be a surgical consultation.   Try the gabapentin mostly at bedtime.

## 2023-06-23 DIAGNOSIS — M48062 Spinal stenosis, lumbar region with neurogenic claudication: Secondary | ICD-10-CM | POA: Diagnosis not present

## 2023-06-23 DIAGNOSIS — M5417 Radiculopathy, lumbosacral region: Secondary | ICD-10-CM | POA: Diagnosis not present

## 2023-06-26 ENCOUNTER — Other Ambulatory Visit: Payer: Self-pay | Admitting: Internal Medicine

## 2023-06-27 ENCOUNTER — Other Ambulatory Visit: Payer: Self-pay | Admitting: Internal Medicine

## 2023-06-29 DIAGNOSIS — M1712 Unilateral primary osteoarthritis, left knee: Secondary | ICD-10-CM | POA: Diagnosis not present

## 2023-07-07 ENCOUNTER — Encounter: Payer: Self-pay | Admitting: Family Medicine

## 2023-07-07 ENCOUNTER — Ambulatory Visit: Payer: Self-pay | Admitting: Internal Medicine

## 2023-07-07 ENCOUNTER — Ambulatory Visit: Payer: Medicare PPO | Admitting: Family Medicine

## 2023-07-07 ENCOUNTER — Ambulatory Visit (INDEPENDENT_AMBULATORY_CARE_PROVIDER_SITE_OTHER): Payer: Medicare PPO

## 2023-07-07 VITALS — BP 126/84 | HR 91 | Temp 98.0°F | Ht 61.0 in | Wt 186.0 lb

## 2023-07-07 DIAGNOSIS — J45901 Unspecified asthma with (acute) exacerbation: Secondary | ICD-10-CM | POA: Diagnosis not present

## 2023-07-07 DIAGNOSIS — R0602 Shortness of breath: Secondary | ICD-10-CM | POA: Diagnosis not present

## 2023-07-07 DIAGNOSIS — R062 Wheezing: Secondary | ICD-10-CM

## 2023-07-07 DIAGNOSIS — R051 Acute cough: Secondary | ICD-10-CM | POA: Diagnosis not present

## 2023-07-07 DIAGNOSIS — U071 COVID-19: Secondary | ICD-10-CM

## 2023-07-07 DIAGNOSIS — R519 Headache, unspecified: Secondary | ICD-10-CM

## 2023-07-07 DIAGNOSIS — I10 Essential (primary) hypertension: Secondary | ICD-10-CM | POA: Diagnosis not present

## 2023-07-07 DIAGNOSIS — R059 Cough, unspecified: Secondary | ICD-10-CM | POA: Diagnosis not present

## 2023-07-07 DIAGNOSIS — J9811 Atelectasis: Secondary | ICD-10-CM | POA: Diagnosis not present

## 2023-07-07 LAB — BASIC METABOLIC PANEL
BUN: 19 mg/dL (ref 6–23)
CO2: 24 meq/L (ref 19–32)
Calcium: 9.4 mg/dL (ref 8.4–10.5)
Chloride: 105 meq/L (ref 96–112)
Creatinine, Ser: 1.55 mg/dL — ABNORMAL HIGH (ref 0.40–1.20)
GFR: 31.03 mL/min — ABNORMAL LOW (ref >60.00)
Glucose, Bld: 92 mg/dL (ref 70–99)
Potassium: 5 meq/L (ref 3.5–5.1)
Sodium: 136 meq/L (ref 135–145)

## 2023-07-07 LAB — CBC WITH DIFFERENTIAL/PLATELET
Basophils Absolute: 0.1 10*3/uL (ref 0.0–0.1)
Basophils Relative: 0.7 % (ref 0.0–3.0)
Eosinophils Absolute: 0.1 10*3/uL (ref 0.0–0.7)
Eosinophils Relative: 1.8 % (ref 0.0–5.0)
HCT: 37 % (ref 36.0–46.0)
Hemoglobin: 12 g/dL (ref 12.0–15.0)
Lymphocytes Relative: 11.3 % — ABNORMAL LOW (ref 12.0–46.0)
Lymphs Abs: 0.8 10*3/uL (ref 0.7–4.0)
MCHC: 32.5 g/dL (ref 30.0–36.0)
MCV: 96.7 fL (ref 78.0–100.0)
Monocytes Absolute: 0.8 10*3/uL (ref 0.1–1.0)
Monocytes Relative: 12.5 % — ABNORMAL HIGH (ref 3.0–12.0)
Neutro Abs: 4.9 10*3/uL (ref 1.4–7.7)
Neutrophils Relative %: 73.7 % (ref 43.0–77.0)
Platelets: 177 10*3/uL (ref 150.0–400.0)
RBC: 3.83 Mil/uL — ABNORMAL LOW (ref 3.87–5.11)
RDW: 12.6 % (ref 11.5–15.5)
WBC: 6.7 10*3/uL (ref 4.0–10.5)

## 2023-07-07 LAB — POCT RESPIRATORY SYNCYTIAL VIRUS: RSV Rapid Ag: NEGATIVE

## 2023-07-07 LAB — POC COVID19 BINAXNOW: SARS Coronavirus 2 Ag: POSITIVE — AB

## 2023-07-07 LAB — POCT INFLUENZA A/B
Influenza A, POC: NEGATIVE
Influenza B, POC: NEGATIVE

## 2023-07-07 MED ORDER — METHYLPREDNISOLONE ACETATE 40 MG/ML IJ SUSP
40.0000 mg | Freq: Once | INTRAMUSCULAR | Status: AC
Start: 2023-07-07 — End: 2023-07-07
  Administered 2023-07-07: 40 mg via INTRAMUSCULAR

## 2023-07-07 MED ORDER — NIRMATRELVIR/RITONAVIR (PAXLOVID) TABLET (RENAL DOSING)
2.0000 | ORAL_TABLET | Freq: Two times a day (BID) | ORAL | 0 refills | Status: AC
Start: 2023-07-07 — End: 2023-07-12

## 2023-07-07 MED ORDER — ALBUTEROL SULFATE HFA 108 (90 BASE) MCG/ACT IN AERS
2.0000 | INHALATION_SPRAY | Freq: Four times a day (QID) | RESPIRATORY_TRACT | 0 refills | Status: AC | PRN
Start: 2023-07-07 — End: ?

## 2023-07-07 NOTE — Progress Notes (Signed)
Please let her know that I sent in Paxlovid to treat her Covid. Please start taking it tonight with food.

## 2023-07-07 NOTE — Progress Notes (Addendum)
Subjective:  Catherine Hoffman is a 83 y.o. female who presents for a 4-day history of headache, chills, cough, chest congestion, shortness of breath. No documented fever.  Denies body aches, dizziness, ear pain, sore throat, chest pain, palpitations, abdominal pain, nausea, vomiting, diarrhea.  She has underlying asthma.  States she has been out of her inhalers.   ROS as in subjective.   Objective: Vitals:   07/07/23 1442  BP: 126/84  Pulse: 91  Temp: 98 F (36.7 C)  SpO2: 98%    General appearance: Alert, WD/WN, no distress, mildly ill appearing                             Skin: warm, no rash                           Head: no sinus tenderness                            Eyes: conjunctiva normal, corneas clear, PERRLA                            Ears: pearly TMs, external ear canals normal                                       Mouth/throat: MMM, tongue normal, mild pharyngeal erythema                           Neck: supple, no adenopathy, no thyromegaly, nontender                          Heart: RRR                         Lungs: exp wheezes, scattered rhonchi      Assessment: COVID-19 virus infection - Plan: methylPREDNISolone acetate (DEPO-MEDROL) injection 40 mg, CBC with Differential/Platelet, Basic metabolic panel, Basic metabolic panel, CBC with Differential/Platelet, nirmatrelvir/ritonavir, renal dosing, (PAXLOVID) 10 x 150 MG & 10 x 100MG  TABS  Acute cough - Plan: DG Chest 2 View, POC COVID-19 BinaxNow, POCT Influenza A/B, POCT respiratory syncytial virus  Mild asthma with acute exacerbation, unspecified whether persistent - Plan: DG Chest 2 View, albuterol (VENTOLIN HFA) 108 (90 Base) MCG/ACT inhaler  Acute nonintractable headache, unspecified headache type - Plan: POC COVID-19 BinaxNow, POCT Influenza A/B, POCT respiratory syncytial virus  Essential hypertension - Plan: CBC with Differential/Platelet, Basic metabolic panel, Basic metabolic panel, CBC with  Differential/Platelet, CANCELED: CBC with Differential/Platelet, CANCELED: Basic metabolic panel  Shortness of breath - Plan: DG Chest 2 View, POC COVID-19 BinaxNow, POCT Influenza A/B, POCT respiratory syncytial virus, methylPREDNISolone acetate (DEPO-MEDROL) injection 40 mg, CBC with Differential/Platelet, Basic metabolic panel, Basic metabolic panel, CBC with Differential/Platelet, CANCELED: CBC with Differential/Platelet, CANCELED: Basic metabolic panel  Wheezing - Plan: POC COVID-19 BinaxNow, POCT Influenza A/B, POCT respiratory syncytial virus, methylPREDNISolone acetate (DEPO-MEDROL) injection 40 mg, albuterol (VENTOLIN HFA) 108 (90 Base) MCG/ACT inhaler   Plan: Covid + She is quite ill appearing  STAT BMP (to get renal function) and CBC done.  STAT Chest X ray Paxlovid (renal dosing) prescribed.  Discussed symptomatic management.  Refilled albuterol inhaler.  Depo-Medrol 40 mg IM injection given in office.  Follow up if worsening or if not improving in the next 3-4 days.  Recommend follow up with PCP in the next 2-4 weeks for asthma and worsening renal function.

## 2023-07-07 NOTE — Telephone Encounter (Signed)
Copied from CRM [id number]. Topic: Clinical - Red Word Triage >> Jul 07, 2023  9:12 AM Prudencio Pair wrote: Red Word that prompted transfer to Nurse Triage: Patient states she is wheezing & coughing bad. Head hurts as well & can't hardly rest. Wants to see if PCP can prescribe her something or do she needs to be seen.   Chief Complaint: Cough Symptoms: Headache, Congestion, Wheezing, Cough Frequency: Acute Pertinent Negatives: Patient denies chest pain or dyspnea.  Disposition: [] ED /[] Urgent Care (no appt availability in office) / [x] Appointment(In office/virtual)/ []  [facility]/ [] Home Care/ [] Refused Recommended Disposition /[]  Mobile Bus/ []  Follow-up with PCP Additional Notes: LL is a 83 year old female being triaged for a cough and wheezing. The patient reports a headache and congestion as well. The patient has a contributing respiratory history. Same day appointment made for today.     Reason for Disposition  Wheezing is present  Answer Assessment - Initial Assessment Questions 1. ONSET: "When did the cough begin?"      *No Answer* 2. SEVERITY: "How bad is the cough today?"      Persistent, Strong  3. SPUTUM: "Describe the color of your sputum" (none, dry cough; clear, white, yellow, green)     None  4. HEMOPTYSIS: "Are you coughing up any blood?" If so ask: "How much?" (flecks, streaks, tablespoons, etc.)      None  5. DIFFICULTY BREATHING: "Are you having difficulty breathing?" If Yes, ask: "How bad is it?" (e.g., mild, moderate, severe)    - MILD: No SOB at rest, mild SOB with walking, speaks normally in sentences, can lie down, no retractions, pulse < 100.    - MODERATE: SOB at rest, SOB with minimal exertion and prefers to sit, cannot lie down flat, speaks in phrases, mild retractions, audible wheezing, pulse 100-120.    - SEVERE: Very SOB at rest, speaks in single words, struggling to breathe, sitting hunched forward, retractions, pulse > 120       No  6. FEVER: "Do you have a fever?" If Yes, ask: "What is your temperature, how was it measured, and when did it start?"     No  7. CARDIAC HISTORY: "Do you have any history of heart disease?" (e.g., heart attack, congestive heart failure)      No  8. LUNG HISTORY: "Do you have any history of lung disease?"  (e.g., pulmonary embolus, asthma, emphysema)     Lung History  9. PE RISK FACTORS: "Do you have a history of blood clots?" (or: recent major surgery, recent prolonged travel, bedridden)     No  10. OTHER SYMPTOMS: "Do you have any other symptoms?" (e.g., runny nose, wheezing, chest pain)       Wheezing, Headache (On the side)  11. PREGNANCY: "Is there any chance you are pregnant?" "When was your last menstrual period?"       No  12. TRAVEL: "Have you traveled out of the country in the last month?" (e.g., travel history, exposures)       No  Protocols used: Cough - Acute Non-Productive-A-AH

## 2023-07-07 NOTE — Progress Notes (Signed)
Please ask her how she is doing Thursday. Her chest X ray is ok. Did she start the Paxlovid last night?

## 2023-07-07 NOTE — Patient Instructions (Signed)
You are positive for COVID  Please go downstairs for labs and a chest x-ray before you leave.  You will hear from me regarding whether or not I can prescribe the Paxlovid based on your kidney functions.  You would need to start this back tomorrow morning in order for it to be effective for you.  Use albuterol inhaler as needed.  You received a steroid injection in the office today.  If you are getting significantly worse, chest pain, shortness of breath, dehydration or weakness then you should go to the emergency department.

## 2023-07-08 ENCOUNTER — Telehealth: Payer: Self-pay

## 2023-07-08 NOTE — Telephone Encounter (Signed)
Called and notified pt medication is at pharmacy, pt verbalized understanding

## 2023-07-08 NOTE — Telephone Encounter (Signed)
Called pharmacy and confirmed they do have medication.   ATC pt but VM box was not set up and was unable to leave VM. Will try again later.

## 2023-07-08 NOTE — Telephone Encounter (Signed)
Copied from CRM [id number]. Topic: Clinical - Prescription Issue >> Jul 08, 2023  9:51 AM Prudencio Pair wrote: Reason for CRM: Patient states that she was prescribed some medication from her visit on 07/07/23 but the pharmacy states they haven't gotten it. Medication is Paxlovid. Checked chart & in the progress notes from Dr. Suezanne Jacquet, she stated she would have to check if she could prescribe it first due to pt's kidney functions & the pt would hear back from her. Pt has not heard back & is in need of something for the horrible coughing that she is having. Please give patient a call back to advise. CB #: [phone].

## 2023-07-08 NOTE — Telephone Encounter (Signed)
Copied from CRM [id number]. Topic: Clinical - Medication Refill >> Jul 07, 2023  5:17 PM Aletta Edouard wrote: Most Recent Primary Care Visit:  Provider: Laurey Arrow  Department: [facility]  Visit Type: MEDICARE AWV, SEQUENTIAL  Date: 05/04/2023  Medication: COUGH SYRUP   Has the patient contacted their pharmacy? Yes (Agent: If no, request that the patient contact the pharmacy for the refill. If patient does not wish to contact the pharmacy document the reason why and proceed with request.) (Agent: If yes, when and what did the pharmacy advise?)  Is this the correct pharmacy for this prescription? Yes If no, delete pharmacy and type the correct one.  This is the patient's preferred pharmacy: YES [id number] - [place], [place] - [address] AT [facility] [address] [place] [address] Phone: [phone] Fax: [phone]   Has the prescription been filled recently? No  Is the patient out of the medication? Yes  Has the patient been seen for an appointment in the last year OR does the patient have an upcoming appointment? Yes  Can we respond through MyChart? No  Agent: Please be advised that Rx refills may take up to 3 business days. We ask that you follow-up with your pharmacy.

## 2023-07-30 DIAGNOSIS — M1712 Unilateral primary osteoarthritis, left knee: Secondary | ICD-10-CM | POA: Diagnosis not present

## 2023-08-05 DIAGNOSIS — M48062 Spinal stenosis, lumbar region with neurogenic claudication: Secondary | ICD-10-CM | POA: Diagnosis not present

## 2023-08-05 DIAGNOSIS — Z6834 Body mass index (BMI) 34.0-34.9, adult: Secondary | ICD-10-CM | POA: Diagnosis not present

## 2023-08-26 ENCOUNTER — Other Ambulatory Visit: Payer: Self-pay | Admitting: Internal Medicine

## 2023-08-27 DIAGNOSIS — M1712 Unilateral primary osteoarthritis, left knee: Secondary | ICD-10-CM | POA: Diagnosis not present

## 2023-09-06 ENCOUNTER — Other Ambulatory Visit: Payer: Self-pay | Admitting: Neurological Surgery

## 2023-09-08 NOTE — Progress Notes (Signed)
 Surgical Instructions   Your procedure is scheduled on Friday, April 4th, 2025. Report to [facility] Main Entrance "A" at 5:30 A.M., then check in with the Admitting office. Any questions or running late day of surgery: call [phone]  Questions prior to your surgery date: call [phone], Monday-Friday, 8am-4pm. If you experience any cold or flu symptoms such as cough, fever, chills, shortness of breath, etc. between now and your scheduled surgery, please notify us at the above number.     Remember:  Do not eat or drink after midnight the night before your surgery    Take these medicines the morning of surgery with A SIP OF WATER: Cetirizine (Zyrtec) Estradiol (Estrace) Omeprazole (Prilosec) Oxybutynin (Ditroplan-XL)   May take these medicines IF NEEDED: Acetaminophen (Tylenol) Albuterol (Ventolin) Inhaler - please bring with you Fluticasone (Flonase)  Please follow your surgeon's instructions on when to stop your Aspirin.  If no instructions received, please reach out to your surgeon's office.     One week prior to surgery, STOP taking any Aleve, Naproxen, Ibuprofen, Motrin, Advil, Goody's, BC's, all herbal medications, fish oil, and non-prescription vitamins.                     Do NOT Smoke (Tobacco/Vaping) for 24 hours prior to your procedure.  If you use a CPAP at night, you may bring your mask/headgear for your overnight stay.   You will be asked to remove any contacts, glasses, piercing's, hearing aid's, dentures/partials prior to surgery. Please bring cases for these items if needed.    Patients discharged the day of surgery will not be allowed to drive home, and someone needs to stay with them for 24 hours.  SURGICAL WAITING ROOM VISITATION Patients may have no more than 2 support people in the waiting area - these visitors may rotate.   Pre-op nurse will coordinate an appropriate time for 1 ADULT support person, who may not rotate, to accompany patient in  pre-op.  Children under the age of 34 must have an adult with them who is not the patient and must remain in the main waiting area with an adult.  If the patient needs to stay at the hospital during part of their recovery, the visitor guidelines for inpatient rooms apply.  Please refer to the [facility] website for the visitor guidelines for any additional information.   If you received a COVID test during your pre-op visit  it is requested that you wear a mask when out in public, stay away from anyone that may not be feeling well and notify your surgeon if you develop symptoms. If you have been in contact with anyone that has tested positive in the last 10 days please notify you surgeon.      Pre-operative 5 CHG Bathing Instructions   You can play a key role in reducing the risk of infection after surgery. Your skin needs to be as free of germs as possible. You can reduce the number of germs on your skin by washing with CHG (chlorhexidine gluconate) soap before surgery. CHG is an antiseptic soap that kills germs and continues to kill germs even after washing.   DO NOT use if you have an allergy to chlorhexidine/CHG or antibacterial soaps. If your skin becomes reddened or irritated, stop using the CHG and notify one of our RNs at [phone].   Please shower with the CHG soap starting 4 days before surgery using the following schedule:     Please keep  in mind the following:  DO NOT shave, including legs and underarms, starting the day of your first shower.   You may shave your face at any point before/day of surgery.  Place clean sheets on your bed the day you start using CHG soap. Use a clean washcloth (not used since being washed) for each shower. DO NOT sleep with pets once you start using the CHG.   CHG Shower Instructions:  Wash your face and private area with normal soap. If you choose to wash your hair, wash first with your normal shampoo.  After you use shampoo/soap, rinse  your hair and body thoroughly to remove shampoo/soap residue.  Turn the water OFF and apply about 3 tablespoons (45 ml) of CHG soap to a CLEAN washcloth.  Apply CHG soap ONLY FROM YOUR NECK DOWN TO YOUR TOES (washing for 3-5 minutes)  DO NOT use CHG soap on face, private areas, open wounds, or sores.  Pay special attention to the area where your surgery is being performed.  If you are having back surgery, having someone wash your back for you may be helpful. Wait 2 minutes after CHG soap is applied, then you may rinse off the CHG soap.  Pat dry with a clean towel  Put on clean clothes/pajamas   If you choose to wear lotion, please use ONLY the CHG-compatible lotions that are listed below.  Additional instructions for the day of surgery: DO NOT APPLY any lotions, deodorants, cologne, or perfumes.   Do not bring valuables to the hospital. [facility] is not responsible for any belongings/valuables. Do not wear nail polish, gel polish, artificial nails, or any other type of covering on natural nails (fingers and toes) Do not wear jewelry or makeup Put on clean/comfortable clothes.  Please brush your teeth.  Ask your nurse before applying any prescription medications to the skin.     CHG Compatible Lotions   Aveeno Moisturizing lotion  Cetaphil Moisturizing Cream  Cetaphil Moisturizing Lotion  Clairol Herbal Essence Moisturizing Lotion, Dry Skin  Clairol Herbal Essence Moisturizing Lotion, Extra Dry Skin  Clairol Herbal Essence Moisturizing Lotion, Normal Skin  Curel Age Defying Therapeutic Moisturizing Lotion with Alpha Hydroxy  Curel Extreme Care Body Lotion  Curel Soothing Hands Moisturizing Hand Lotion  Curel Therapeutic Moisturizing Cream, Fragrance-Free  Curel Therapeutic Moisturizing Lotion, Fragrance-Free  Curel Therapeutic Moisturizing Lotion, Original Formula  Eucerin Daily Replenishing Lotion  Eucerin Dry Skin Therapy Plus Alpha Hydroxy Crme  Eucerin Dry Skin Therapy  Plus Alpha Hydroxy Lotion  Eucerin Original Crme  Eucerin Original Lotion  Eucerin Plus Crme Eucerin Plus Lotion  Eucerin TriLipid Replenishing Lotion  Keri Anti-Bacterial Hand Lotion  Keri Deep Conditioning Original Lotion Dry Skin Formula Softly Scented  Keri Deep Conditioning Original Lotion, Fragrance Free Sensitive Skin Formula  Keri Lotion Fast Absorbing Fragrance Free Sensitive Skin Formula  Keri Lotion Fast Absorbing Softly Scented Dry Skin Formula  Keri Original Lotion  Keri Skin Renewal Lotion Keri Silky Smooth Lotion  Keri Silky Smooth Sensitive Skin Lotion  Nivea Body Creamy Conditioning Oil  Nivea Body Extra Enriched Lotion  Nivea Body Original Lotion  Nivea Body Sheer Moisturizing Lotion Nivea Crme  Nivea Skin Firming Lotion  NutraDerm 30 Skin Lotion  NutraDerm Skin Lotion  NutraDerm Therapeutic Skin Cream  NutraDerm Therapeutic Skin Lotion  ProShield Protective Hand Cream  Provon moisturizing lotion  Please read over the following fact sheets that you were given.

## 2023-09-09 ENCOUNTER — Encounter (HOSPITAL_COMMUNITY)
Admission: RE | Admit: 2023-09-09 | Discharge: 2023-09-09 | Disposition: A | Discharge status: Home / Self Care | Source: Ambulatory Visit | Attending: Neurological Surgery | Admitting: Neurological Surgery

## 2023-09-09 ENCOUNTER — Other Ambulatory Visit: Payer: Self-pay

## 2023-09-09 ENCOUNTER — Encounter (HOSPITAL_COMMUNITY): Payer: Self-pay

## 2023-09-09 ENCOUNTER — Other Ambulatory Visit: Payer: Self-pay | Admitting: Neurological Surgery

## 2023-09-09 VITALS — BP 129/63 | HR 65 | Temp 97.9°F | Resp 18 | Ht 61.0 in | Wt 179.8 lb

## 2023-09-09 DIAGNOSIS — Z01818 Encounter for other preprocedural examination: Secondary | ICD-10-CM | POA: Diagnosis not present

## 2023-09-09 LAB — BASIC METABOLIC PANEL WITH GFR
Anion gap: 9 (ref 5–15)
BUN: 19 mg/dL (ref 8–23)
CO2: 23 mmol/L (ref 22–32)
Calcium: 9.9 mg/dL (ref 8.9–10.3)
Chloride: 107 mmol/L (ref 98–111)
Creatinine, Ser: 1.24 mg/dL — ABNORMAL HIGH (ref 0.44–1.00)
GFR, Estimated: 43 mL/min — ABNORMAL LOW (ref >60)
Glucose, Bld: 105 mg/dL — ABNORMAL HIGH (ref 70–99)
Potassium: 4.1 mmol/L (ref 3.5–5.1)
Sodium: 139 mmol/L (ref 135–145)

## 2023-09-09 LAB — SURGICAL PCR SCREEN
MRSA, PCR: NEGATIVE
Staphylococcus aureus: NEGATIVE

## 2023-09-09 LAB — CBC
HCT: 38.2 % (ref 36.0–46.0)
Hemoglobin: 11.9 g/dL — ABNORMAL LOW (ref 12.0–15.0)
MCH: 29.9 pg (ref 26.0–34.0)
MCHC: 31.2 g/dL (ref 30.0–36.0)
MCV: 96 fL (ref 80.0–100.0)
Platelets: 201 10*3/uL (ref 150–400)
RBC: 3.98 MIL/uL (ref 3.87–5.11)
RDW: 12.3 % (ref 11.5–15.5)
WBC: 6 10*3/uL (ref 4.0–10.5)
nRBC: 0 % (ref 0.0–0.2)

## 2023-09-09 LAB — PROTIME-INR
INR: 1 (ref 0.8–1.2)
Prothrombin Time: 12.9 s (ref 11.4–15.2)

## 2023-09-09 NOTE — Progress Notes (Signed)
 Surgical Instructions   Your procedure is scheduled on Friday, April 4th, 2025. Report to [facility] Main Entrance "A" at 5:30 A.M., then check in with the Admitting office. Any questions or running late day of surgery: call [phone]  Questions prior to your surgery date: call [phone], Monday-Friday, 8am-4pm. If you experience any cold or flu symptoms such as cough, fever, chills, shortness of breath, etc. between now and your scheduled surgery, please notify us at the above number.     Remember:  Do not eat or drink after midnight the night before your surgery    Take these medicines the morning of surgery with A SIP OF WATER: Cetirizine (Zyrtec) Estradiol (Estrace) Omeprazole (Prilosec) Oxybutynin (Ditroplan-XL)   May take these medicines IF NEEDED: Acetaminophen (Tylenol) Albuterol (Ventolin) Inhaler - please bring with you Fluticasone (Flonase)  Please follow your surgeon's instructions on when to stop your Aspirin.  If no instructions received, please reach out to your surgeon's office.     One week prior to surgery, STOP taking any Meloxicam(Mobic), Aleve, Naproxen, Ibuprofen, Motrin, Advil, Goody's, BC's, all herbal medications, fish oil, and non-prescription vitamins.T                     Do NOT Smoke (Tobacco/Vaping) for 24 hours prior to your procedure.  If you use a CPAP at night, you may bring your mask/headgear for your overnight stay.   You will be asked to remove any contacts, glasses, piercing's, hearing aid's, dentures/partials prior to surgery. Please bring cases for these items if needed.    Patients discharged the day of surgery will not be allowed to drive home, and someone needs to stay with them for 24 hours.  SURGICAL WAITING ROOM VISITATION Patients may have no more than 2 support people in the waiting area - these visitors may rotate.   Pre-op nurse will coordinate an appropriate time for 1 ADULT support person, who may not rotate, to  accompany patient in pre-op.  Children under the age of 69 must have an adult with them who is not the patient and must remain in the main waiting area with an adult.  If the patient needs to stay at the hospital during part of their recovery, the visitor guidelines for inpatient rooms apply.  Please refer to the [facility] website for the visitor guidelines for any additional information.   If you received a COVID test during your pre-op visit  it is requested that you wear a mask when out in public, stay away from anyone that may not be feeling well and notify your surgeon if you develop symptoms. If you have been in contact with anyone that has tested positive in the last 10 days please notify you surgeon.      Pre-operative 5 CHG Bathing Instructions   You can play a key role in reducing the risk of infection after surgery. Your skin needs to be as free of germs as possible. You can reduce the number of germs on your skin by washing with CHG (chlorhexidine gluconate) soap before surgery. CHG is an antiseptic soap that kills germs and continues to kill germs even after washing.   DO NOT use if you have an allergy to chlorhexidine/CHG or antibacterial soaps. If your skin becomes reddened or irritated, stop using the CHG and notify one of our RNs at [phone].   Please shower with the CHG soap starting 4 days before surgery using the following schedule:     Please  keep in mind the following:  DO NOT shave, including legs and underarms, starting the day of your first shower.   You may shave your face at any point before/day of surgery.  Place clean sheets on your bed the day you start using CHG soap. Use a clean washcloth (not used since being washed) for each shower. DO NOT sleep with pets once you start using the CHG.   CHG Shower Instructions:  Wash your face and private area with normal soap. If you choose to wash your hair, wash first with your normal shampoo.  After you use  shampoo/soap, rinse your hair and body thoroughly to remove shampoo/soap residue.  Turn the water OFF and apply about 3 tablespoons (45 ml) of CHG soap to a CLEAN washcloth.  Apply CHG soap ONLY FROM YOUR NECK DOWN TO YOUR TOES (washing for 3-5 minutes)  DO NOT use CHG soap on face, private areas, open wounds, or sores.  Pay special attention to the area where your surgery is being performed.  If you are having back surgery, having someone wash your back for you may be helpful. Wait 2 minutes after CHG soap is applied, then you may rinse off the CHG soap.  Pat dry with a clean towel  Put on clean clothes/pajamas   If you choose to wear lotion, please use ONLY the CHG-compatible lotions that are listed below.  Additional instructions for the day of surgery: DO NOT APPLY any lotions, deodorants, cologne, or perfumes.   Do not bring valuables to the hospital. [facility] is not responsible for any belongings/valuables. Do not wear nail polish, gel polish, artificial nails, or any other type of covering on natural nails (fingers and toes) Do not wear jewelry or makeup Put on clean/comfortable clothes.  Please brush your teeth.  Ask your nurse before applying any prescription medications to the skin.     CHG Compatible Lotions   Aveeno Moisturizing lotion  Cetaphil Moisturizing Cream  Cetaphil Moisturizing Lotion  Clairol Herbal Essence Moisturizing Lotion, Dry Skin  Clairol Herbal Essence Moisturizing Lotion, Extra Dry Skin  Clairol Herbal Essence Moisturizing Lotion, Normal Skin  Curel Age Defying Therapeutic Moisturizing Lotion with Alpha Hydroxy  Curel Extreme Care Body Lotion  Curel Soothing Hands Moisturizing Hand Lotion  Curel Therapeutic Moisturizing Cream, Fragrance-Free  Curel Therapeutic Moisturizing Lotion, Fragrance-Free  Curel Therapeutic Moisturizing Lotion, Original Formula  Eucerin Daily Replenishing Lotion  Eucerin Dry Skin Therapy Plus Alpha Hydroxy Crme  Eucerin  Dry Skin Therapy Plus Alpha Hydroxy Lotion  Eucerin Original Crme  Eucerin Original Lotion  Eucerin Plus Crme Eucerin Plus Lotion  Eucerin TriLipid Replenishing Lotion  Keri Anti-Bacterial Hand Lotion  Keri Deep Conditioning Original Lotion Dry Skin Formula Softly Scented  Keri Deep Conditioning Original Lotion, Fragrance Free Sensitive Skin Formula  Keri Lotion Fast Absorbing Fragrance Free Sensitive Skin Formula  Keri Lotion Fast Absorbing Softly Scented Dry Skin Formula  Keri Original Lotion  Keri Skin Renewal Lotion Keri Silky Smooth Lotion  Keri Silky Smooth Sensitive Skin Lotion  Nivea Body Creamy Conditioning Oil  Nivea Body Extra Enriched Lotion  Nivea Body Original Lotion  Nivea Body Sheer Moisturizing Lotion Nivea Crme  Nivea Skin Firming Lotion  NutraDerm 30 Skin Lotion  NutraDerm Skin Lotion  NutraDerm Therapeutic Skin Cream  NutraDerm Therapeutic Skin Lotion  ProShield Protective Hand Cream  Provon moisturizing lotion  Please read over the following fact sheets that you were given.

## 2023-09-09 NOTE — Anesthesia Preprocedure Evaluation (Signed)
 Anesthesia Evaluation  Patient identified by MRN, date of birth, ID band Patient awake    Reviewed: Allergy & Precautions, NPO status , Patient's Chart, lab work & pertinent test results  Airway Mallampati: III  TM Distance: >3 FB Neck ROM: Full    Dental  (+) Dental Advisory Given, Upper Dentures, Missing   Pulmonary asthma    Pulmonary exam normal breath sounds clear to auscultation       Cardiovascular hypertension, Pt. on medications (-) angina (-) Past MI Normal cardiovascular exam Rhythm:Regular Rate:Normal     Neuro/Psych  Headaches Spinal stenosis, lumbar region, with neurogenic claudication  negative psych ROS   GI/Hepatic Neg liver ROS,GERD  Medicated and Controlled,,  Endo/Other  Obesity   Renal/GU negative Renal ROS     Musculoskeletal  (+) Arthritis ,    Abdominal   Peds  Hematology  (+) Blood dyscrasia, anemia   Anesthesia Other Findings   Reproductive/Obstetrics                             Anesthesia Physical Anesthesia Plan  ASA: 3  Anesthesia Plan: General   Post-op Pain Management: Tylenol PO (pre-op)*   Induction: Intravenous  PONV Risk Score and Plan: 3 and Dexamethasone and Ondansetron  Airway Management Planned: Oral ETT  Additional Equipment: ClearSight  Intra-op Plan:   Post-operative Plan: Extubation in OR  Informed Consent: I have reviewed the patients History and Physical, chart, labs and discussed the procedure including the risks, benefits and alternatives for the proposed anesthesia with the patient or authorized representative who has indicated his/her understanding and acceptance.     Dental advisory given  Plan Discussed with: CRNA  Anesthesia Plan Comments: (2nd PIV)       Anesthesia Quick Evaluation

## 2023-09-10 ENCOUNTER — Ambulatory Visit (HOSPITAL_COMMUNITY)
Admission: RE | Disposition: A | Discharge status: Skilled Nursing Facility | Payer: Self-pay | Source: Home / Self Care | Attending: Neurological Surgery

## 2023-09-10 ENCOUNTER — Encounter (HOSPITAL_COMMUNITY): Payer: Self-pay | Admitting: Neurological Surgery

## 2023-09-10 ENCOUNTER — Ambulatory Visit (HOSPITAL_COMMUNITY)

## 2023-09-10 ENCOUNTER — Other Ambulatory Visit: Payer: Self-pay

## 2023-09-10 ENCOUNTER — Other Ambulatory Visit: Payer: Self-pay | Admitting: Internal Medicine

## 2023-09-10 ENCOUNTER — Observation Stay (HOSPITAL_COMMUNITY)
Admission: RE | Admit: 2023-09-10 | Discharge: 2023-09-13 | Disposition: A | Discharge status: Skilled Nursing Facility | Attending: Neurological Surgery | Admitting: Neurological Surgery

## 2023-09-10 ENCOUNTER — Ambulatory Visit (HOSPITAL_BASED_OUTPATIENT_CLINIC_OR_DEPARTMENT_OTHER): Payer: Self-pay | Admitting: Anesthesiology

## 2023-09-10 ENCOUNTER — Ambulatory Visit (HOSPITAL_COMMUNITY): Payer: Self-pay | Admitting: Physician Assistant

## 2023-09-10 DIAGNOSIS — M5416 Radiculopathy, lumbar region: Secondary | ICD-10-CM | POA: Diagnosis not present

## 2023-09-10 DIAGNOSIS — M48061 Spinal stenosis, lumbar region without neurogenic claudication: Secondary | ICD-10-CM | POA: Diagnosis not present

## 2023-09-10 DIAGNOSIS — J45909 Unspecified asthma, uncomplicated: Secondary | ICD-10-CM | POA: Diagnosis not present

## 2023-09-10 DIAGNOSIS — I10 Essential (primary) hypertension: Secondary | ICD-10-CM | POA: Diagnosis not present

## 2023-09-10 DIAGNOSIS — Z7982 Long term (current) use of aspirin: Secondary | ICD-10-CM | POA: Diagnosis not present

## 2023-09-10 DIAGNOSIS — Z0189 Encounter for other specified special examinations: Secondary | ICD-10-CM | POA: Diagnosis not present

## 2023-09-10 DIAGNOSIS — Z96651 Presence of right artificial knee joint: Secondary | ICD-10-CM | POA: Diagnosis not present

## 2023-09-10 DIAGNOSIS — Z79899 Other long term (current) drug therapy: Secondary | ICD-10-CM | POA: Insufficient documentation

## 2023-09-10 DIAGNOSIS — M48062 Spinal stenosis, lumbar region with neurogenic claudication: Secondary | ICD-10-CM | POA: Diagnosis not present

## 2023-09-10 DIAGNOSIS — Z9889 Other specified postprocedural states: Principal | ICD-10-CM

## 2023-09-10 DIAGNOSIS — E785 Hyperlipidemia, unspecified: Secondary | ICD-10-CM | POA: Diagnosis not present

## 2023-09-10 DIAGNOSIS — J4520 Mild intermittent asthma, uncomplicated: Secondary | ICD-10-CM | POA: Diagnosis not present

## 2023-09-10 HISTORY — PX: LUMBAR LAMINECTOMY/DECOMPRESSION MICRODISCECTOMY: SHX5026

## 2023-09-10 SURGERY — LUMBAR LAMINECTOMY/DECOMPRESSION MICRODISCECTOMY 4 LEVEL
Anesthesia: General | Site: Back | Laterality: Left

## 2023-09-10 MED ORDER — FENTANYL CITRATE (PF) 250 MCG/5ML IJ SOLN
INTRAMUSCULAR | Status: DC | PRN
  Administered 2023-09-10 (×5): 50 ug via INTRAVENOUS

## 2023-09-10 MED ORDER — MENTHOL 3 MG MT LOZG: 1.0000 | LOZENGE | OROMUCOSAL | Status: DC | PRN

## 2023-09-10 MED ORDER — PROPOFOL 10 MG/ML IV BOLUS
INTRAVENOUS | Status: DC | PRN
  Administered 2023-09-10: 125 mg via INTRAVENOUS

## 2023-09-10 MED ORDER — THROMBIN 5000 UNITS EX KIT
PACK | CUTANEOUS | Status: AC
Start: 2023-09-10 — End: ?
  Filled 2023-09-10: qty 1

## 2023-09-10 MED ORDER — ONDANSETRON HCL 4 MG PO TABS: 4.0000 mg | ORAL_TABLET | Freq: Four times a day (QID) | ORAL | Status: DC | PRN

## 2023-09-10 MED ORDER — DEXAMETHASONE SODIUM PHOSPHATE 10 MG/ML IJ SOLN
INTRAMUSCULAR | Status: DC | PRN
Start: 2023-09-10 — End: 2023-09-10
  Administered 2023-09-10: 10 mg via INTRAVENOUS

## 2023-09-10 MED ORDER — THROMBIN (RECOMBINANT) 5000 UNITS EX SOLR
CUTANEOUS | Status: AC
  Filled 2023-09-10: qty 5000

## 2023-09-10 MED ORDER — OXYCODONE HCL 5 MG PO TABS
5.0000 mg | ORAL_TABLET | ORAL | Status: DC | PRN
  Administered 2023-09-10 – 2023-09-11 (×3): 5 mg via ORAL
  Filled 2023-09-10 (×2): qty 1

## 2023-09-10 MED ORDER — CHLORHEXIDINE GLUCONATE 0.12 % MT SOLN
15.0000 mL | Freq: Once | OROMUCOSAL | Status: AC
  Administered 2023-09-10: 15 mL via OROMUCOSAL
  Filled 2023-09-10: qty 15

## 2023-09-10 MED ORDER — SODIUM CHLORIDE 0.9% FLUSH
3.0000 mL | INTRAVENOUS | Status: DC | PRN
  Administered 2023-09-10: 10 mL via INTRAVENOUS

## 2023-09-10 MED ORDER — CHLORHEXIDINE GLUCONATE CLOTH 2 % EX PADS: 6.0000 | MEDICATED_PAD | Freq: Once | CUTANEOUS | Status: DC

## 2023-09-10 MED ORDER — BUPIVACAINE HCL (PF) 0.25 % IJ SOLN
INTRAMUSCULAR | Status: AC
  Filled 2023-09-10: qty 30

## 2023-09-10 MED ORDER — MORPHINE SULFATE (PF) 2 MG/ML IV SOLN: 2.0000 mg | INTRAVENOUS | Status: DC | PRN

## 2023-09-10 MED ORDER — ACETAMINOPHEN 500 MG PO TABS
1000.0000 mg | ORAL_TABLET | Freq: Four times a day (QID) | ORAL | Status: AC
  Administered 2023-09-10 – 2023-09-11 (×3): 1000 mg via ORAL
  Filled 2023-09-10 (×3): qty 2

## 2023-09-10 MED ORDER — SUGAMMADEX SODIUM 200 MG/2ML IV SOLN
INTRAVENOUS | Status: DC | PRN
  Administered 2023-09-10: 200 mg via INTRAVENOUS

## 2023-09-10 MED ORDER — DEXAMETHASONE SODIUM PHOSPHATE 4 MG/ML IJ SOLN: 4.0000 mg | Freq: Four times a day (QID) | INTRAMUSCULAR | Status: DC

## 2023-09-10 MED ORDER — MAGNESIUM OXIDE -MG SUPPLEMENT 400 (240 MG) MG PO TABS: 200.0000 mg | ORAL_TABLET | Freq: Every day | ORAL | Status: DC | PRN

## 2023-09-10 MED ORDER — METHOCARBAMOL 500 MG PO TABS
500.0000 mg | ORAL_TABLET | Freq: Four times a day (QID) | ORAL | Status: DC | PRN
  Administered 2023-09-11 (×2): 500 mg via ORAL
  Filled 2023-09-10 (×2): qty 1

## 2023-09-10 MED ORDER — LACTATED RINGERS IV SOLN: INTRAVENOUS | Status: DC | PRN

## 2023-09-10 MED ORDER — FENTANYL CITRATE (PF) 100 MCG/2ML IJ SOLN: 25.0000 ug | INTRAMUSCULAR | Status: DC | PRN

## 2023-09-10 MED ORDER — SPIRONOLACTONE 25 MG PO TABS
50.0000 mg | ORAL_TABLET | Freq: Every day | ORAL | Status: DC
  Administered 2023-09-11 – 2023-09-13 (×3): 50 mg via ORAL
  Filled 2023-09-10 (×3): qty 2

## 2023-09-10 MED ORDER — BUPIVACAINE HCL (PF) 0.25 % IJ SOLN
INTRAMUSCULAR | Status: DC | PRN
  Administered 2023-09-10: 10 mL

## 2023-09-10 MED ORDER — DEXAMETHASONE 4 MG PO TABS
4.0000 mg | ORAL_TABLET | Freq: Four times a day (QID) | ORAL | Status: DC
  Administered 2023-09-10 – 2023-09-12 (×7): 4 mg via ORAL
  Filled 2023-09-10 (×7): qty 1

## 2023-09-10 MED ORDER — ONDANSETRON HCL 4 MG/2ML IJ SOLN: 4.0000 mg | Freq: Once | INTRAMUSCULAR | Status: DC | PRN

## 2023-09-10 MED ORDER — SODIUM CHLORIDE 0.9% FLUSH: 3.0000 mL | INTRAVENOUS | Status: DC | PRN

## 2023-09-10 MED ORDER — METHOCARBAMOL 500 MG PO TABS
ORAL_TABLET | ORAL | Status: AC
  Administered 2023-09-10: 500 mg via ORAL
  Filled 2023-09-10: qty 1

## 2023-09-10 MED ORDER — LIDOCAINE 2% (20 MG/ML) 5 ML SYRINGE
INTRAMUSCULAR | Status: AC
  Filled 2023-09-10: qty 5

## 2023-09-10 MED ORDER — OXYCODONE HCL 5 MG PO TABS
ORAL_TABLET | ORAL | Status: AC
Start: 2023-09-10 — End: 2023-09-11
  Filled 2023-09-10: qty 1

## 2023-09-10 MED ORDER — EPHEDRINE 5 MG/ML INJ
INTRAVENOUS | Status: AC
  Filled 2023-09-10: qty 5

## 2023-09-10 MED ORDER — DEXAMETHASONE SODIUM PHOSPHATE 10 MG/ML IJ SOLN
INTRAMUSCULAR | Status: AC
  Filled 2023-09-10: qty 1

## 2023-09-10 MED ORDER — ESTRADIOL 0.5 MG PO TABS
0.5000 mg | ORAL_TABLET | ORAL | Status: DC
  Administered 2023-09-12: 0.5 mg via ORAL
  Filled 2023-09-10: qty 1

## 2023-09-10 MED ORDER — PHENYLEPHRINE 80 MCG/ML (10ML) SYRINGE FOR IV PUSH (FOR BLOOD PRESSURE SUPPORT)
PREFILLED_SYRINGE | INTRAVENOUS | Status: AC
  Filled 2023-09-10: qty 10

## 2023-09-10 MED ORDER — LIDOCAINE 2% (20 MG/ML) 5 ML SYRINGE
INTRAMUSCULAR | Status: DC | PRN
  Administered 2023-09-10: 50 mg via INTRAVENOUS

## 2023-09-10 MED ORDER — ACETAMINOPHEN 500 MG PO TABS
1000.0000 mg | ORAL_TABLET | Freq: Once | ORAL | Status: AC
Start: 2023-09-10 — End: 2023-09-10
  Administered 2023-09-10: 1000 mg via ORAL
  Filled 2023-09-10: qty 2

## 2023-09-10 MED ORDER — PHENYLEPHRINE HCL-NACL 20-0.9 MG/250ML-% IV SOLN
INTRAVENOUS | Status: DC | PRN
  Administered 2023-09-10: 50 ug/min via INTRAVENOUS

## 2023-09-10 MED ORDER — SODIUM CHLORIDE 0.9% FLUSH
3.0000 mL | Freq: Two times a day (BID) | INTRAVENOUS | Status: DC
Start: 2023-09-10 — End: 2023-09-13
  Administered 2023-09-10: 3 mL via INTRAVENOUS
  Administered 2023-09-11: 10 mL via INTRAVENOUS
  Administered 2023-09-11: 3 mL via INTRAVENOUS
  Administered 2023-09-12: 10 mL via INTRAVENOUS
  Administered 2023-09-12: 3 mL via INTRAVENOUS
  Administered 2023-09-13: 10 mL via INTRAVENOUS

## 2023-09-10 MED ORDER — THROMBIN 20000 UNITS EX SOLR
CUTANEOUS | Status: AC
  Filled 2023-09-10: qty 20000

## 2023-09-10 MED ORDER — THROMBIN 20000 UNITS EX SOLR
CUTANEOUS | Status: DC | PRN
  Administered 2023-09-10: 20 mL via TOPICAL

## 2023-09-10 MED ORDER — CELECOXIB 200 MG PO CAPS
200.0000 mg | ORAL_CAPSULE | Freq: Two times a day (BID) | ORAL | Status: DC
  Administered 2023-09-10 – 2023-09-13 (×6): 200 mg via ORAL
  Filled 2023-09-10 (×6): qty 1

## 2023-09-10 MED ORDER — PROPOFOL 10 MG/ML IV BOLUS
INTRAVENOUS | Status: AC
  Filled 2023-09-10: qty 20

## 2023-09-10 MED ORDER — ALBUTEROL SULFATE HFA 108 (90 BASE) MCG/ACT IN AERS: 2.0000 | INHALATION_SPRAY | Freq: Four times a day (QID) | RESPIRATORY_TRACT | Status: DC | PRN

## 2023-09-10 MED ORDER — CEFAZOLIN SODIUM-DEXTROSE 2-4 GM/100ML-% IV SOLN
2.0000 g | Freq: Three times a day (TID) | INTRAVENOUS | Status: AC
  Administered 2023-09-10: 2 g via INTRAVENOUS
  Filled 2023-09-10: qty 100

## 2023-09-10 MED ORDER — ROCURONIUM BROMIDE 10 MG/ML (PF) SYRINGE
PREFILLED_SYRINGE | INTRAVENOUS | Status: DC | PRN
  Administered 2023-09-10: 60 mg via INTRAVENOUS

## 2023-09-10 MED ORDER — CEFAZOLIN SODIUM-DEXTROSE 2-4 GM/100ML-% IV SOLN: 2.0000 g | INTRAVENOUS | Status: DC

## 2023-09-10 MED ORDER — ROCURONIUM BROMIDE 10 MG/ML (PF) SYRINGE
PREFILLED_SYRINGE | INTRAVENOUS | Status: AC
  Filled 2023-09-10: qty 10

## 2023-09-10 MED ORDER — CEFAZOLIN SODIUM-DEXTROSE 2-4 GM/100ML-% IV SOLN
2.0000 g | INTRAVENOUS | Status: AC
  Administered 2023-09-10: 2 g via INTRAVENOUS
  Filled 2023-09-10: qty 100

## 2023-09-10 MED ORDER — ALBUMIN HUMAN 5 % IV SOLN
INTRAVENOUS | Status: DC | PRN
Start: 2023-09-10 — End: 2023-09-10

## 2023-09-10 MED ORDER — SENNA 8.6 MG PO TABS
1.0000 | ORAL_TABLET | Freq: Two times a day (BID) | ORAL | Status: DC
  Administered 2023-09-10 – 2023-09-13 (×6): 8.6 mg via ORAL
  Filled 2023-09-10 (×6): qty 1

## 2023-09-10 MED ORDER — OXYBUTYNIN CHLORIDE ER 5 MG PO TB24
5.0000 mg | ORAL_TABLET | Freq: Every day | ORAL | Status: DC
  Administered 2023-09-11 – 2023-09-13 (×3): 5 mg via ORAL
  Filled 2023-09-10 (×3): qty 1

## 2023-09-10 MED ORDER — PHENOL 1.4 % MT LIQD: 1.0000 | OROMUCOSAL | Status: DC | PRN

## 2023-09-10 MED ORDER — ORAL CARE MOUTH RINSE: 15.0000 mL | Freq: Once | OROMUCOSAL | Status: AC

## 2023-09-10 MED ORDER — ONDANSETRON HCL 4 MG/2ML IJ SOLN
INTRAMUSCULAR | Status: AC
  Filled 2023-09-10: qty 2

## 2023-09-10 MED ORDER — FENTANYL CITRATE (PF) 250 MCG/5ML IJ SOLN
INTRAMUSCULAR | Status: AC
  Filled 2023-09-10: qty 5

## 2023-09-10 MED ORDER — METHOCARBAMOL 1000 MG/10ML IJ SOLN: 500.0000 mg | Freq: Four times a day (QID) | INTRAMUSCULAR | Status: DC | PRN

## 2023-09-10 MED ORDER — ONDANSETRON HCL 4 MG/2ML IJ SOLN
INTRAMUSCULAR | Status: DC | PRN
  Administered 2023-09-10: 4 mg via INTRAVENOUS

## 2023-09-10 MED ORDER — SODIUM CHLORIDE 0.9 % IV SOLN: 250.0000 mL | INTRAVENOUS | Status: DC

## 2023-09-10 MED ORDER — THROMBIN 5000 UNITS EX KIT
PACK | CUTANEOUS | Status: AC
  Filled 2023-09-10: qty 1

## 2023-09-10 MED ORDER — THROMBIN 5000 UNITS EX SOLR
OROMUCOSAL | Status: DC | PRN
  Administered 2023-09-10 (×2): 5 mL via TOPICAL

## 2023-09-10 MED ORDER — SODIUM CHLORIDE 0.9% FLUSH: 3.0000 mL | Freq: Two times a day (BID) | INTRAVENOUS | Status: DC

## 2023-09-10 MED ORDER — 0.9 % SODIUM CHLORIDE (POUR BTL) OPTIME
TOPICAL | Status: DC | PRN
  Administered 2023-09-10: 1000 mL

## 2023-09-10 MED ORDER — ONDANSETRON HCL 4 MG/2ML IJ SOLN: 4.0000 mg | Freq: Four times a day (QID) | INTRAMUSCULAR | Status: DC | PRN

## 2023-09-10 SURGICAL SUPPLY — 40 items
BAG COUNTER SPONGE SURGICOUNT (BAG) ×2 IMPLANT
BAND RUBBER #18 3X1/16 STRL (MISCELLANEOUS) ×4 IMPLANT
BENZOIN TINCTURE PRP APPL 2/3 (GAUZE/BANDAGES/DRESSINGS) ×2 IMPLANT
BUR CARBIDE MATCH 3.0 (BURR) ×2 IMPLANT
CANISTER SUCT 3000ML PPV (MISCELLANEOUS) ×2 IMPLANT
DRAPE LAPAROTOMY 100X72X124 (DRAPES) ×2 IMPLANT
DRAPE MICROSCOPE SLANT 54X150 (MISCELLANEOUS) ×2 IMPLANT
DRAPE SURG 17X23 STRL (DRAPES) ×2 IMPLANT
DRSG OPSITE POSTOP 4X6 (GAUZE/BANDAGES/DRESSINGS) IMPLANT
DURAPREP 26ML APPLICATOR (WOUND CARE) ×2 IMPLANT
ELECT REM PT RETURN 9FT ADLT (ELECTROSURGICAL) ×1 IMPLANT
ELECTRODE REM PT RTRN 9FT ADLT (ELECTROSURGICAL) ×2 IMPLANT
GAUZE 4X4 16PLY ~~LOC~~+RFID DBL (SPONGE) IMPLANT
GAUZE SPONGE 4X4 12PLY STRL (GAUZE/BANDAGES/DRESSINGS) IMPLANT
GLOVE BIO SURGEON STRL SZ7 (GLOVE) IMPLANT
GLOVE BIO SURGEON STRL SZ8 (GLOVE) ×2 IMPLANT
GLOVE BIOGEL PI IND STRL 7.0 (GLOVE) IMPLANT
GOWN STRL REUS W/ TWL LRG LVL3 (GOWN DISPOSABLE) IMPLANT
GOWN STRL REUS W/ TWL XL LVL3 (GOWN DISPOSABLE) ×2 IMPLANT
GOWN STRL REUS W/TWL 2XL LVL3 (GOWN DISPOSABLE) IMPLANT
HEMOSTAT POWDER KIT SURGIFOAM (HEMOSTASIS) ×2 IMPLANT
KIT BASIN OR (CUSTOM PROCEDURE TRAY) ×2 IMPLANT
KIT TURNOVER KIT B (KITS) ×2 IMPLANT
NDL HYPO 25X1 1.5 SAFETY (NEEDLE) ×2 IMPLANT
NDL SPNL 20GX3.5 QUINCKE YW (NEEDLE) IMPLANT
NEEDLE HYPO 25X1 1.5 SAFETY (NEEDLE) ×1 IMPLANT
NEEDLE SPNL 20GX3.5 QUINCKE YW (NEEDLE) IMPLANT
NS IRRIG 1000ML POUR BTL (IV SOLUTION) ×2 IMPLANT
PACK LAMINECTOMY NEURO (CUSTOM PROCEDURE TRAY) ×2 IMPLANT
PAD ARMBOARD POSITIONER FOAM (MISCELLANEOUS) ×6 IMPLANT
POWDER MYRIAD MORCLLS FINE 500 (Miscellaneous) IMPLANT
PWDR MYRIAD MORCELLS FINE 500 (Miscellaneous) ×1 IMPLANT
SPONGE SURGIFOAM ABS GEL 100 (HEMOSTASIS) IMPLANT
STRIP CLOSURE SKIN 1/2X4 (GAUZE/BANDAGES/DRESSINGS) ×2 IMPLANT
SUT VIC AB 0 CT1 18XCR BRD8 (SUTURE) ×2 IMPLANT
SUT VIC AB 2-0 CP2 18 (SUTURE) ×2 IMPLANT
SUT VIC AB 3-0 SH 8-18 (SUTURE) ×2 IMPLANT
TOWEL GREEN STERILE (TOWEL DISPOSABLE) ×2 IMPLANT
TOWEL GREEN STERILE FF (TOWEL DISPOSABLE) ×2 IMPLANT
WATER STERILE IRR 1000ML POUR (IV SOLUTION) ×2 IMPLANT

## 2023-09-10 NOTE — Anesthesia Postprocedure Evaluation (Signed)
 Anesthesia Post Note  Patient: KEARSTON PUTMAN  Procedure(s) Performed: LUMBAR LAMINECTOMY/FORAMINATOMY LEFT LUMBAR TWO-LUMBAR THREE LUMBAR THREE-LUMBAR FOUR LUMBAR FOUR-LUMBAR FIVE BILATERAL LUMBAR FIVE-SACRAL-ONE (Left: Back)     Patient location during evaluation: PACU Anesthesia Type: General Level of consciousness: awake and alert Pain management: pain level controlled Vital Signs Assessment: post-procedure vital signs reviewed and stable Respiratory status: spontaneous breathing, nonlabored ventilation and respiratory function stable Cardiovascular status: blood pressure returned to baseline and stable Postop Assessment: no apparent nausea or vomiting Anesthetic complications: no   There were no known notable events for this encounter.  Last Vitals:  Vitals:   09/10/23 1215 09/10/23 1623  BP: (!) 97/56 126/68  Pulse: (!) 48 (!) 57  Resp: 13 13  Temp: 36.4 C 36.6 C  SpO2: 100% 98%    Last Pain:  Vitals:   09/10/23 1623  TempSrc:   PainSc: 0-No pain                 Collene Schlichter

## 2023-09-10 NOTE — Anesthesia Procedure Notes (Signed)
 Procedure Name: Intubation Date/Time: 09/10/2023 7:48 AM  Performed by: Gloris Ham, CRNAPre-anesthesia Checklist: Patient identified, Emergency Drugs available, Suction available and Patient being monitored Patient Re-evaluated:Patient Re-evaluated prior to induction Oxygen Delivery Method: Circle System Utilized Preoxygenation: Pre-oxygenation with 100% oxygen Induction Type: IV induction Ventilation: Mask ventilation without difficulty Laryngoscope Size: Miller and 2 Grade View: Grade I Tube type: Oral Tube size: 7.0 mm Number of attempts: 1 Airway Equipment and Method: Stylet and Oral airway Placement Confirmation: ETT inserted through vocal cords under direct vision, positive ETCO2 and breath sounds checked- equal and bilateral Secured at: 19 cm Tube secured with: Tape Dental Injury: Teeth and Oropharynx as per pre-operative assessment

## 2023-09-10 NOTE — Transfer of Care (Signed)
 Immediate Anesthesia Transfer of Care Note  Patient: Catherine Hoffman  Procedure(s) Performed: LUMBAR LAMINECTOMY/FORAMINATOMY LEFT LUMBAR TWO-LUMBAR THREE LUMBAR THREE-LUMBAR FOUR LUMBAR FOUR-LUMBAR FIVE BILATERAL LUMBAR FIVE-SACRAL-ONE (Left: Back)  Patient Location: PACU  Anesthesia Type:General  Level of Consciousness: awake, alert , and oriented  Airway & Oxygen Therapy: Patient Spontanous Breathing and Patient connected to face mask oxygen  Post-op Assessment: Report given to RN and Post -op Vital signs reviewed and stable  Post vital signs: Reviewed and stable  Last Vitals:  Vitals Value Taken Time  BP 123/62 09/10/23 1045  Temp 36.4 C 09/10/23 1045  Pulse 56 09/10/23 1046  Resp 13 09/10/23 1046  SpO2 98 % 09/10/23 1046  Vitals shown include unfiled device data.  Last Pain:  Vitals:   09/10/23 1045  TempSrc:   PainSc: Asleep         Complications: There were no known notable events for this encounter.

## 2023-09-10 NOTE — Op Note (Signed)
 09/10/2023  10:36 AM  PATIENT:  Catherine Hoffman  83 y.o. female  PRE-OPERATIVE DIAGNOSIS: Lumbar spinal stenosis L2-3 L3-4 L4-5 L5-S1 with back pain and left more than right radiculopathy  POST-OPERATIVE DIAGNOSIS:  same  PROCEDURE: Decompressive lumbar hemilaminectomy medial facetectomy and foraminotomies L2-3 L3-4 L4-5 and L5-S1 on the left, decompressive lumbar hemilaminectomy medial facetectomy and foraminotomies L5-S1 on the right, sublaminar decompression L2-3 L3-4 L4-5  SURGEON:  Marikay Alar, MD  ASSISTANTS: Verlin Dike, FNP  ANESTHESIA:   General  EBL: 260 ml  Total I/O In: 250 [IV Piggyback:250] Out: 260 [Blood:260]  BLOOD ADMINISTERED: none  DRAINS: Medium Hemovac  SPECIMEN:  none  INDICATION FOR PROCEDURE: This patient presented with back pain with left more than right leg pain. Imaging showed spinal stenosis L2-S1. The patient tried conservative measures without relief. Pain was debilitating. Recommended decompressive laminectomy. Patient understood the risks, benefits, and alternatives and potential outcomes and wished to proceed.  PROCEDURE DETAILS: The patient was taken to the operating room and after induction of adequate generalized endotracheal anesthesia, the patient was rolled into the prone position on the Wilson frame and all pressure points were padded. The lumbar region was cleaned and then prepped with DuraPrep and draped in the usual sterile fashion. 5 cc of local anesthesia was injected and then a dorsal midline incision was made and carried down to the lumbo sacral fascia. The fascia was opened and the paraspinous musculature was taken down in a subperiosteal fashion to expose L2-3 L3-4 L4-5  on the left and L5-S1 bilaterally. Intraoperative x-ray confirmed my level, and then I used a combination of the high-speed drill and the Kerrison punches to perform a hemilaminectomy, medial facetectomy, and foraminotomy at L2-3 L3-4 L4-5 and L5-S1 on the left.   Performed the same decompression at L5-S1 on the right.  The underlying yellow ligament was opened and removed in a piecemeal fashion to expose the underlying dura and exiting nerve root. I undercut the lateral recess and dissected down until I was medial to and distal to the pedicle. The nerve root was well decompressed at each level.  At L2-3 L3-4 and L4-5 I drilled up under the spinous process and the opposite lamina and remove the yellow ligament from the opposite side to perform a sublaminar decompression at each of the 3 levels.  I then palpated with a coronary dilator along the nerve root and into the foramen to assure adequate decompression at each level. I felt no more compression of the nerve root. I irrigated with saline solution containing bacitracin.  I placed a medium Hemovac drain on the left.  Achieved hemostasis with bipolar cautery and Surgifoam, lined the dura with Gelfoam, and then closed the fascia with 0 Vicryl. I closed the subcutaneous tissues with 2-0 Vicryl and the subcuticular tissues with 3-0 Vicryl. The skin was then closed with benzoin and Steri-Strips. The drapes were removed, a sterile dressing was applied.  My nurse practitioner was involved in the exposure, safe retraction of the neural elements, and the closure. the patient was awakened from general anesthesia and transferred to the recovery room in stable condition. At the end of the procedure all sponge, needle and instrument counts were correct.    PLAN OF CARE: Admit for overnight observation  PATIENT DISPOSITION:  PACU - hemodynamically stable.   Delay start of Pharmacological VTE agent (>24hrs) due to surgical blood loss or risk of bleeding:  yes

## 2023-09-10 NOTE — H&P (Signed)
 Subjective: Patient is a 83 y.o. female admitted for back and leg pain. Onset of symptoms was several months ago, gradually worsening since that time.  The pain is rated severe, and is located at the across the lower back and radiates to legs L>R. The pain is described as aching and occurs all day. The symptoms have been progressive. Symptoms are exacerbated by exercise, standing, and walking for more than a few minutes. MRI or CT showed lumbar stenosis   Past Medical History:  Diagnosis Date   Arthritis    Cataract    removed both eyes    Episodic tension type headache    Esophageal reflux    GERD (gastroesophageal reflux disease)    Glaucoma    Heart murmur    as young adult   Hyperlipidemia    Hypertension    Internal hemorrhoids    Kidney stone on left side Hx-date unknown   Left rotator cuff tear     Past Surgical History:  Procedure Laterality Date   COLONOSCOPY  11/20/2008   COLONOSCOPY     EYE SURGERY Bilateral    cataract   KNEE ARTHROSCOPY Left 2004   SHOULDER OPEN ROTATOR CUFF REPAIR Left 08/30/2012   Procedure: ROTATOR CUFF REPAIR SHOULDER OPEN, POSSIBLE GRAFT AND ANCHORS;  Surgeon: Jacki Cones, MD;  Location: [facility];  Service: Orthopedics;  Laterality: Left;   TOTAL ABDOMINAL HYSTERECTOMY W/ BILATERAL SALPINGOOPHORECTOMY  1990's   TOTAL KNEE ARTHROPLASTY Right 09/05/2019   Procedure: RIGHT TOTAL KNEE ARTHROPLASTY;  Surgeon: Marcene Corning, MD;  Location: [facility];  Service: Orthopedics;  Laterality: Right;    Prior to Admission medications   Medication Sig Start Date End Date Taking? Authorizing Provider  acetaminophen (TYLENOL) 500 MG tablet Take 1,000 mg by mouth every 6 (six) hours as needed (pain.).   Yes [provider]  aspirin ([employer] ASPIRIN ADULT LOW DOSE) 81 MG chewable tablet Chew 1 tablet (81 mg total) by mouth daily. 06/16/22  Yes Myrlene Broker, MD  cetirizine (ZYRTEC) 10 MG tablet TAKE 1 TABLET BY MOUTH EVERY DAY  08/26/21  Yes Myrlene Broker, MD  Cholecalciferol (VITAMIN D3) 50 MCG (2000 UT) TABS Take 2,000 Units by mouth in the morning.   Yes [provider]  estradiol (ESTRACE) 0.5 MG tablet Take 0.5 mg by mouth every other day. 10/11/22  Yes [provider]  fluticasone (FLONASE) 50 MCG/ACT nasal spray PLACE 2 SPRAYS INTO BOTH NOSTRILS DAILY. SHAKE LIQUID AND USE 2 SPRAYS IN EACH NOSTRIL DAILY Patient taking differently: Place 2 sprays into both nostrils daily as needed for allergies. 01/15/20  Yes Myrlene Broker, MD  MAGNESIUM PO Take 1 capsule by mouth daily as needed (cramps).   Yes [provider]  meloxicam (MOBIC) 15 MG tablet TAKE 1 TABLET BY MOUTH EVERY DAY 08/26/23  Yes Myrlene Broker, MD  Omega-3 Fatty Acids (FISH OIL) 1000 MG CAPS Take 1,000 mg by mouth in the morning.   Yes [provider]  omeprazole (PRILOSEC) 40 MG capsule TAKE 1 CAPSULE BY MOUTH TWICE DAILY 30 MINUTES BEFORE FIRST AND LAST MEAL OF THE DAY Patient taking differently: Take 40 mg by mouth in the morning. 02/09/23  Yes Myrlene Broker, MD  oxybutynin (DITROPAN-XL) 5 MG 24 hr tablet TAKE 1 TABLET BY MOUTH EVERYDAY AT BEDTIME Patient taking differently: Take 5 mg by mouth in the morning. 02/09/23  Yes Myrlene Broker, MD  potassium chloride SA (KLOR-CON M20) 20 MEQ tablet  TAKE 1 TABLET BY MOUTH EVERY DAY 02/09/23  Yes Myrlene Broker, MD  spironolactone (ALDACTONE) 50 MG tablet TAKE 1 TABLET BY MOUTH EVERY DAY 02/09/23  Yes Myrlene Broker, MD  albuterol (VENTOLIN HFA) 108 (90 Base) MCG/ACT inhaler Inhale 2 puffs into the lungs every 6 (six) hours as needed for wheezing or shortness of breath. 07/07/23   Henson, Vickie L, NP-C  gabapentin (NEURONTIN) 100 MG capsule Take 1-3 capsules (100-300 mg total) by mouth at bedtime as needed (nerve pain). Patient taking differently: Take 100 mg by mouth at bedtime as needed (nerve pain). 06/16/23   Rodolph Bong, MD   guaiFENesin-codeine 100-10 MG/5ML syrup Take 5 mLs by mouth every 6 (six) hours as needed for cough (Use also prior to MRI to reduce cough). 12/16/22   Rodolph Bong, MD  latanoprost (XALATAN) 0.005 % ophthalmic solution Place 1 drop into both eyes daily as needed (eye weakness).    [provider]  montelukast (SINGULAIR) 10 MG tablet TAKE 1 TABLET BY MOUTH EVERYDAY AT BEDTIME Patient taking differently: Take 10 mg by mouth daily as needed (allergies.). 06/28/23   Myrlene Broker, MD   Allergies  Allergen Reactions   Hydrocodone-Acetaminophen Nausea And Vomiting   Other Hives and Rash    Fresh Fruit-facial rash/hives (bananas, grapes,etc)  Red meat-upset stomach   Tramadol Itching    Social History   Tobacco Use   Smoking status: Never   Smokeless tobacco: Never  Substance Use Topics   Alcohol use: No    Family History  Problem Relation Age of Onset   Hypertension Mother    Diabetes Mother    Cancer Mother        in leg   Varicose Veins Maternal Uncle    Colon cancer Neg Hx    Colon polyps Neg Hx    Esophageal cancer Neg Hx    Stomach cancer Neg Hx    Rectal cancer Neg Hx      Review of Systems  Positive ROS: neg  All other systems have been reviewed and were otherwise negative with the exception of those mentioned in the HPI and as above.  Objective: Vital signs in last 24 hours: Temp:  [97.7 F (36.5 C)-97.9 F (36.6 C)] 97.7 F (36.5 C) (04/04 0557) Pulse Rate:  [65-68] 68 (04/04 0557) Resp:  [17-18] 17 (04/04 0557) BP: (129-138)/(63-75) 138/75 (04/04 0557) SpO2:  [96 %-99 %] 96 % (04/04 0557) Weight:  [81.2 kg-81.6 kg] 81.2 kg (04/04 0557)  General Appearance: Alert, cooperative, no distress, appears stated age Head: Normocephalic, without obvious abnormality, atraumatic Eyes: PERRL, conjunctiva/corneas clear, EOM's intact    Neck: Supple, symmetrical, trachea midline Back: Symmetric, no curvature, ROM normal, no CVA tenderness Lungs:   respirations unlabored Heart: Regular rate and rhythm Abdomen: Soft, non-tender Extremities: Extremities normal, atraumatic, no cyanosis or edema Pulses: 2+ and symmetric all extremities Skin: Skin color, texture, turgor normal, no rashes or lesions  NEUROLOGIC:   Mental status: Alert and oriented x4,  no aphasia, good attention span, fund of knowledge, and memory Motor Exam - grossly normal Sensory Exam - grossly normal Reflexes: 1+ Coordination - grossly normal Gait - grossly normal Balance - grossly normal Cranial Nerves: I: smell Not tested  II: visual acuity  OS: nl    OD: nl  II: visual fields Full to confrontation  II: pupils Equal, round, reactive to light  III,VII: ptosis None  III,IV,VI: extraocular muscles  Full ROM  V: mastication Normal  V: facial light touch sensation  Normal  V,VII: corneal reflex  Present  VII: facial muscle function - upper  Normal  VII: facial muscle function - lower Normal  VIII: hearing Not tested  IX: soft palate elevation  Normal  IX,X: gag reflex Present  XI: trapezius strength  5/5  XI: sternocleidomastoid strength 5/5  XI: neck flexion strength  5/5  XII: tongue strength  Normal    Data Review Lab Results  Component Value Date   WBC 6.0 09/09/2023   HGB 11.9 (L) 09/09/2023   HCT 38.2 09/09/2023   MCV 96.0 09/09/2023   PLT 201 09/09/2023   Lab Results  Component Value Date   NA 139 09/09/2023   K 4.1 09/09/2023   CL 107 09/09/2023   CO2 23 09/09/2023   BUN 19 09/09/2023   CREATININE 1.24 (H) 09/09/2023   GLUCOSE 105 (H) 09/09/2023   Lab Results  Component Value Date   INR 1.0 09/09/2023    Assessment/Plan:  Estimated body mass index is 33.82 kg/m as calculated from the following:   Height as of this encounter: 5\' 1"  (1.549 m).   Weight as of this encounter: 81.2 kg. Patient admitted for laminectomy for stenosis. Patient has failed a reasonable attempt at conservative therapy.  I explained the condition and  procedure to the patient and answered any questions.  Patient wishes to proceed with procedure as planned. Understands risks/ benefits and typical outcomes of procedure.   Tia Alert 09/10/2023 7:25 AM

## 2023-09-10 NOTE — Plan of Care (Signed)

## 2023-09-11 DIAGNOSIS — Z79899 Other long term (current) drug therapy: Secondary | ICD-10-CM | POA: Diagnosis not present

## 2023-09-11 DIAGNOSIS — M5416 Radiculopathy, lumbar region: Secondary | ICD-10-CM | POA: Diagnosis not present

## 2023-09-11 DIAGNOSIS — I10 Essential (primary) hypertension: Secondary | ICD-10-CM | POA: Diagnosis not present

## 2023-09-11 DIAGNOSIS — Z7982 Long term (current) use of aspirin: Secondary | ICD-10-CM | POA: Diagnosis not present

## 2023-09-11 DIAGNOSIS — Z96651 Presence of right artificial knee joint: Secondary | ICD-10-CM | POA: Diagnosis not present

## 2023-09-11 DIAGNOSIS — M48061 Spinal stenosis, lumbar region without neurogenic claudication: Secondary | ICD-10-CM | POA: Diagnosis not present

## 2023-09-11 NOTE — Plan of Care (Signed)

## 2023-09-11 NOTE — Progress Notes (Signed)
 NEUROSURGERY PROGRESS NOTE  Doing ok, has a lot of back pain but controlled with pain medication. Has tingling in the bottom of her right foot when she stands. States that she feels like her legs are weak and giving out. Has pain down her legs as well. Will have her work with therapy today and see how she does getting up. Hemovac in place, have discussed possible re-exploration given her weakness. Will reassess her tomorrow.  Temp:  [97.6 F (36.4 C)-98.7 F (37.1 C)] 98.7 F (37.1 C) (04/05 0851) Pulse Rate:  [44-60] 54 (04/05 0851) Resp:  [10-21] 17 (04/05 0851) BP: (97-136)/(44-70) 102/56 (04/05 0851) SpO2:  [93 %-100 %] 96 % (04/05 0851) Weight:  [85.9 kg] 85.9 kg (04/05 0623)   Sherryl Manges, NP 09/11/2023 9:33 AM

## 2023-09-11 NOTE — Evaluation (Signed)
 Occupational Therapy Evaluation Patient Details Name: Catherine Hoffman MRN: [id number] DOB: 1941/03/25 Today's Date: 09/11/2023   History of Present Illness   Pt is an 83 y.o. female s/p lumbar laminectomy/foraminotomy L2-L5 on the left; bilateral L5-S1 laminectomy/foraminotomy. PMH significant for arthritis, GERD, glaucoma, HLD, HTN, L rotator cuff tear.     Clinical Impressions PTA, pt lived alone with intermittent assist from friends for transportation to grocery store and doctor appts. Upon eval, pt with decreased strength, balance, power, and activity tolerance. Pt educated regarding back precautions but needing up to mod cues to maintain. Pt needing min A and significantly increased time for bed mobility as well as min A for SPT transfer. Pt with report of no one who can physically assist her at home as she normally transfers to power chair and walks short distances without assist. Due to decr support and functional change in status, Patient will benefit from continued inpatient follow up therapy, <3 hours/day      If plan is discharge home, recommend the following:   A little help with walking and/or transfers;A lot of help with bathing/dressing/bathroom;Assistance with cooking/housework;Assist for transportation;Help with stairs or ramp for entrance     Functional Status Assessment   Patient has had a recent decline in their functional status and demonstrates the ability to make significant improvements in function in a reasonable and predictable amount of time.     Equipment Recommendations   Tub/shower bench     Recommendations for Other Services   PT consult     Precautions/Restrictions   Precautions Precautions: Back Precaution Booklet Issued: Yes (comment) Recall of Precautions/Restrictions: Intact Precaution/Restrictions Comments: reviewed within the context of ADL Required Braces or Orthoses:  (no brace needed order) Restrictions Weight Bearing Restrictions  Per Provider Order: No     Mobility Bed Mobility Overal bed mobility: Needs Assistance Bed Mobility: Rolling, Sidelying to Sit Rolling: Min assist Sidelying to sit: Min assist       General bed mobility comments: significantly increased time and repeated cues for optimal use of log rolln technique after initial education    Transfers Overall transfer level: Needs assistance Equipment used: Rolling walker (2 wheels), Ambulation equipment used Transfers: Sit to/from Stand, Bed to chair/wheelchair/BSC Sit to Stand: Min assist, Contact guard assist Stand pivot transfers: Min assist         General transfer comment: pt reporting it took 3 persons to help her stand yesterday, so began session using stedy to help build confidence with constant report of fear of knee bucking. Pt needing CGA wiith stedy so transitioned to RW and pt needed min A with RW for STS and for steps L and R to simulate SPT      Balance Overall balance assessment: Needs assistance Sitting-balance support: No upper extremity supported, Feet supported Sitting balance-Leahy Scale: Good Sitting balance - Comments: statically   Standing balance support: Bilateral upper extremity supported, During functional activity Standing balance-Leahy Scale: Poor Standing balance comment: reliant on device                           ADL either performed or assessed with clinical judgement   ADL Overall ADL's : Needs assistance/impaired Eating/Feeding: Set up;Sitting   Grooming: Set up;Sitting   Upper Body Bathing: Set up;Sitting   Lower Body Bathing: Moderate assistance;Sit to/from stand   Upper Body Dressing : Set up;Sitting   Lower Body Dressing: Maximal assistance;Sit to/from stand   Toilet Transfer: Minimal assistance;Stand-pivot;Rolling  walker (2 wheels)   Toileting- Clothing Manipulation and Hygiene: Moderate assistance;Sit to/from stand       Functional mobility during ADLs: Minimal  assistance;Rolling walker (2 wheels)       Vision Baseline Vision/History: 3 Glaucoma Ability to See in Adequate Light: 1 Impaired Patient Visual Report: No change from baseline       Perception         Praxis         Pertinent Vitals/Pain Pain Assessment Pain Assessment: Faces Faces Pain Scale: Hurts even more Pain Location: back, l hip, R knee Pain Descriptors / Indicators: Discomfort, Guarding Pain Intervention(s): Limited activity within patient's tolerance, Monitored during session     Extremity/Trunk Assessment Upper Extremity Assessment Upper Extremity Assessment: Generalized weakness   Lower Extremity Assessment Lower Extremity Assessment: Defer to PT evaluation   Cervical / Trunk Assessment Cervical / Trunk Assessment: Back Surgery   Communication Communication Communication: No apparent difficulties   Cognition Arousal: Alert Behavior During Therapy: WFL for tasks assessed/performed Cognition: No apparent impairments                               Following commands: Intact       Cueing  General Comments   Cueing Techniques: Verbal cues;Gestural cues;Tactile cues      Exercises     Shoulder Instructions      Home Living Family/patient expects to be discharged to:: Private residence Living Arrangements: Alone Available Help at Discharge: Friend(s);Available PRN/intermittently Type of Home: Apartment Home Access: Level entry     Home Layout: One level     Bathroom Shower/Tub: Tub/shower unit         Home Equipment: Agricultural consultant (2 wheels);Rollator (4 wheels);Wheelchair - manual;Wheelchair - power          Prior Functioning/Environment Prior Level of Function : Independent/Modified Independent;Needs assist             Mobility Comments: pt reports use of power chair in and out of the home; able to pivot with her RW; walk short distances to door of grocery store to use electric cart ADLs Comments: modified  independent in ADL; receiveds assist with transportation    OT Problem List: Decreased strength;Impaired balance (sitting and/or standing);Decreased activity tolerance;Decreased knowledge of use of DME or AE;Decreased knowledge of precautions   OT Treatment/Interventions: Self-care/ADL training;Therapeutic exercise;DME and/or AE instruction;Therapeutic activities;Patient/family education;Balance training      OT Goals(Current goals can be found in the care plan section)   Acute Rehab OT Goals Patient Stated Goal: get better OT Goal Formulation: With patient Time For Goal Achievement: 09/25/23 Potential to Achieve Goals: Good   OT Frequency:  Min 2X/week    Co-evaluation              AM-PAC OT "6 Clicks" Daily Activity     Outcome Measure Help from another person eating meals?: None Help from another person taking care of personal grooming?: A Little Help from another person toileting, which includes using toliet, bedpan, or urinal?: A Lot Help from another person bathing (including washing, rinsing, drying)?: A Lot Help from another person to put on and taking off regular upper body clothing?: A Little Help from another person to put on and taking off regular lower body clothing?: A Lot 6 Click Score: 16   End of Session Equipment Utilized During Treatment: Gait belt;Rolling walker (2 wheels) Nurse Communication: Mobility status  Activity Tolerance: Patient tolerated  treatment well Patient left: in chair;with call bell/phone within reach;with chair alarm set  OT Visit Diagnosis: Unsteadiness on feet (R26.81);Muscle weakness (generalized) (M62.81);Pain Pain - part of body:  (back, L hip, R knee)                Time: 4098-1191 OT Time Calculation (min): 41 min Charges:  OT General Charges $OT Visit: 1 Visit OT Evaluation $OT Eval Low Complexity: 1 Low OT Treatments $Self Care/Home Management : 23-37 mins  Tyler Deis, OTR/L [facility] Acute  Rehabilitation Office: [phone]   Myrla Halsted 09/11/2023, 9:09 AM

## 2023-09-11 NOTE — Plan of Care (Signed)

## 2023-09-11 NOTE — Care Management Obs Status (Signed)
 MEDICARE OBSERVATION STATUS NOTIFICATION   Patient Details  Name: Catherine Hoffman MRN: [id number] Date of Birth: [birth year]   Medicare Observation Status Notification Given:  Yes    Isaias Cowman, RN 09/11/2023, 1:14 PM

## 2023-09-11 NOTE — Evaluation (Signed)
 Physical Therapy Evaluation Patient Details Name: Catherine Hoffman MRN: [id number] DOB: [birth year] Today's Date: 09/11/2023  History of Present Illness  Pt is an 83 y.o. female s/p lumbar laminectomy/foraminotomy L2-L5 on the left; bilateral L5-S1 laminectomy/foraminotomy. PMH significant for arthritis, GERD, glaucoma, HLD, HTN, L rotator cuff tear.  Clinical Impression  Patient is s/p above surgery resulting in the deficits listed below (see PT Problem List). PTA pt lived at home alone, mod I with AD vs electric w/c. On eval, pt required min assist bed mobility, and min assist transfers with RW. Patient will benefit from acute skilled PT to increase their independence and safety with mobility (while adhering to their precautions) to allow discharge. Post acute, pt would benefit from further therapy in inpatient setting due to limited assist at home.         If plan is discharge home, recommend the following: A little help with walking and/or transfers;A little help with bathing/dressing/bathroom;Assistance with cooking/housework;Assist for transportation;Help with stairs or ramp for entrance   Can travel by private vehicle   Yes    Equipment Recommendations None recommended by PT  Recommendations for Other Services       Functional Status Assessment Patient has had a recent decline in their functional status and demonstrates the ability to make significant improvements in function in a reasonable and predictable amount of time.     Precautions / Restrictions Precautions Precautions: Back Recall of Precautions/Restrictions: Intact Precaution/Restrictions Comments: reviewed 3/3 back precautions Restrictions Other Position/Activity Restrictions: no brace needed      Mobility  Bed Mobility Overal bed mobility: Needs Assistance Bed Mobility: Sit to Sidelying, Rolling Rolling: Min assist Sidelying to sit: Min assist, Used rails       General bed mobility comments: cues for  sequencing, assist with BLE into bed    Transfers Overall transfer level: Needs assistance Equipment used: Rolling walker (2 wheels) Transfers: Sit to/from Stand, Bed to chair/wheelchair/BSC Sit to Stand: Min assist Stand pivot transfers: Min assist         General transfer comment: assist to power up, cues for sequencing    Ambulation/Gait               General Gait Details: gait deferred due to pt wanting to eat lunch  Stairs            Wheelchair Mobility     Tilt Bed    Modified Rankin (Stroke Patients Only)       Balance Overall balance assessment: Needs assistance Sitting-balance support: No upper extremity supported, Feet supported Sitting balance-Leahy Scale: Good     Standing balance support: Bilateral upper extremity supported, During functional activity, Reliant on assistive device for balance Standing balance-Leahy Scale: Poor                               Pertinent Vitals/Pain Pain Assessment Pain Assessment: Faces Faces Pain Scale: Hurts even more Pain Location: back Pain Descriptors / Indicators: Grimacing, Guarding, Discomfort Pain Intervention(s): Monitored during session, Repositioned, Limited activity within patient's tolerance    Home Living Family/patient expects to be discharged to:: Private residence Living Arrangements: Alone Available Help at Discharge: Friend(s);Available PRN/intermittently Type of Home: Apartment Home Access: Level entry       Home Layout: One level Home Equipment: Agricultural consultant (2 wheels);Rollator (4 wheels);Wheelchair - Engineer, technical sales - power;Cane - single point      Prior Function Prior Level of Function : Independent/Modified Independent;Needs  assist             Mobility Comments: pt reports use of power chair in and out of the home; able to pivot with her RW; walk short distances to door of grocery store to use electric cart ADLs Comments: modified independent in ADL;  receiveds assist with transportation     Extremity/Trunk Assessment   Upper Extremity Assessment Upper Extremity Assessment: Generalized weakness    Lower Extremity Assessment Lower Extremity Assessment: Generalized weakness    Cervical / Trunk Assessment Cervical / Trunk Assessment: Back Surgery  Communication   Communication Communication: No apparent difficulties    Cognition Arousal: Alert Behavior During Therapy: WFL for tasks assessed/performed   PT - Cognitive impairments: No apparent impairments                         Following commands: Intact       Cueing Cueing Techniques: Verbal cues, Gestural cues, Tactile cues     General Comments      Exercises     Assessment/Plan    PT Assessment Patient needs continued PT services  PT Problem List Decreased strength;Decreased balance;Decreased knowledge of precautions;Pain;Decreased mobility;Decreased activity tolerance       PT Treatment Interventions Functional mobility training;Balance training;Patient/family education;Therapeutic activities;Gait training;Therapeutic exercise    PT Goals (Current goals can be found in the Care Plan section)  Acute Rehab PT Goals Patient Stated Goal: home PT Goal Formulation: With patient Time For Goal Achievement: 09/25/23 Potential to Achieve Goals: Good    Frequency Min 5X/week     Co-evaluation               AM-PAC PT "6 Clicks" Mobility  Outcome Measure Help needed turning from your back to your side while in a flat bed without using bedrails?: A Little Help needed moving from lying on your back to sitting on the side of a flat bed without using bedrails?: A Little Help needed moving to and from a bed to a chair (including a wheelchair)?: A Little Help needed standing up from a chair using your arms (e.g., wheelchair or bedside chair)?: A Little Help needed to walk in hospital room?: Total Help needed climbing 3-5 steps with a railing? :  Total 6 Click Score: 14    End of Session Equipment Utilized During Treatment: Gait belt Activity Tolerance: Patient tolerated treatment well Patient left: in bed;with call bell/phone within reach;with family/visitor present Nurse Communication: Mobility status PT Visit Diagnosis: Pain;Muscle weakness (generalized) (M62.81);Other abnormalities of gait and mobility (R26.89)    Time: 7829-5621 PT Time Calculation (min) (ACUTE ONLY): 20 min   Charges:   PT Evaluation $PT Eval Moderate Complexity: 1 Mod   PT General Charges $$ ACUTE PT VISIT: 1 Visit         Ferd Glassing., PT  Office # [phone]   Ilda Foil 09/11/2023, 1:18 PM

## 2023-09-12 DIAGNOSIS — M48061 Spinal stenosis, lumbar region without neurogenic claudication: Secondary | ICD-10-CM | POA: Diagnosis not present

## 2023-09-12 NOTE — Progress Notes (Signed)
 Physical Therapy Treatment Patient Details Name: Catherine Hoffman MRN: [id number] DOB: 03-Feb-1941 Today's Date: 09/12/2023   History of Present Illness Pt is an 83 y.o. female s/p lumbar laminectomy/foraminotomy L2-L5 on the left; bilateral L5-S1 laminectomy/foraminotomy. PMH significant for arthritis, GERD, glaucoma, HLD, HTN, L rotator cuff tear.    PT Comments  Pt is slowly progressing towards goals. Currently pt is Min A for bed mobility, Min A with cueing for safe sit to stand, CGA to Mod A for 40 ft of gait which is a non-functional home distance. Pt required Mod A to prevent fall with AD during short distance gait. Pt only was able to remember 1/3 spinal precautions in order to protect integrity of surgical site. Due to pt current functional status, home set up and available assistance at home recommending skilled physical therapy services < 3 hours/day in order to address strength, balance and functional mobility to decrease risk for falls, injury, immobility, skin break down and re-hospitalization.      If plan is discharge home, recommend the following: A little help with walking and/or transfers;Assistance with cooking/housework;Assist for transportation;Help with stairs or ramp for entrance   Can travel by private vehicle     Yes  Equipment Recommendations  None recommended by PT       Precautions / Restrictions Precautions Precautions: Back Precaution Booklet Issued: No Recall of Precautions/Restrictions: Impaired Precaution/Restrictions Comments: remembered 1/3 back precautions Restrictions Weight Bearing Restrictions Per Provider Order: No Other Position/Activity Restrictions: no brace needed     Mobility  Bed Mobility Overal bed mobility: Needs Assistance Bed Mobility: Sit to Sidelying, Rolling Rolling: Min assist       Sit to sidelying: Min assist General bed mobility comments: Cues for sequencing, assist with BLE into bed    Transfers Overall transfer level:  Needs assistance Equipment used: Rolling walker (2 wheels) Transfers: Sit to/from Stand, Bed to chair/wheelchair/BSC Sit to Stand: Min assist   Step pivot transfers: Min assist       General transfer comment: Physical assist for initial momentum to get to standing and verbal cues for safe hand placement.    Ambulation/Gait Ambulation/Gait assistance: Contact guard assist, Mod assist Gait Distance (Feet): 40 Feet (2x) Assistive device: Rolling walker (2 wheels) Gait Pattern/deviations: Step-to pattern, Decreased stance time - left, Decreased step length - right Gait velocity: decreased Gait velocity interpretation: <1.31 ft/sec, indicative of household ambulator   General Gait Details: Leading with LLE, Pt was able to ambulates 40 ft 2x at CGA. Pt had 1x LOB requiring Mod a to prevent fall with cueing to keep RW close to proximity of body due to pt was trying to leave RW while going to sit EOB despite Max cues.    Balance Overall balance assessment: Needs assistance Sitting-balance support: No upper extremity supported, Feet supported Sitting balance-Leahy Scale: Good     Standing balance support: Bilateral upper extremity supported, During functional activity, Reliant on assistive device for balance Standing balance-Leahy Scale: Poor Standing balance comment: reliant on device, 1x knee buckling on the L and 1x LOB requiring Mod A to prevent fall        Communication Communication Communication: No apparent difficulties  Cognition Arousal: Alert Behavior During Therapy: WFL for tasks assessed/performed   PT - Cognitive impairments: No apparent impairments         Following commands: Intact      Cueing Cueing Techniques: Verbal cues, Tactile cues     General Comments General comments (skin integrity, edema, etc.): No  signs/symptoms of cardiac/respiratory distress throughout session      Pertinent Vitals/Pain Pain Assessment Pain Assessment: Faces Faces Pain  Scale: Hurts little more Pain Location: back Pain Descriptors / Indicators: Grimacing, Guarding, Discomfort Pain Intervention(s): Monitored during session, Limited activity within patient's tolerance     PT Goals (current goals can now be found in the care plan section) Acute Rehab PT Goals Patient Stated Goal: home PT Goal Formulation: With patient Time For Goal Achievement: 09/25/23 Potential to Achieve Goals: Good Progress towards PT goals: Progressing toward goals    Frequency    Min 5X/week      PT Plan  Continue with current POC     AM-PAC PT "6 Clicks" Mobility   Outcome Measure  Help needed turning from your back to your side while in a flat bed without using bedrails?: A Little Help needed moving from lying on your back to sitting on the side of a flat bed without using bedrails?: A Little Help needed moving to and from a bed to a chair (including a wheelchair)?: A Little Help needed standing up from a chair using your arms (e.g., wheelchair or bedside chair)?: A Little Help needed to walk in hospital room?: A Lot Help needed climbing 3-5 steps with a railing? : Total 6 Click Score: 15    End of Session Equipment Utilized During Treatment: Gait belt Activity Tolerance: Patient tolerated treatment well Patient left: in bed;with call bell/phone within reach;with bed alarm set Nurse Communication: Mobility status PT Visit Diagnosis: Pain;Muscle weakness (generalized) (M62.81);Other abnormalities of gait and mobility (R26.89) Pain - Right/Left:  (back)     Time: 9147-8295 PT Time Calculation (min) (ACUTE ONLY): 17 min  Charges:    $Therapeutic Activity: 8-22 mins PT General Charges $$ ACUTE PT VISIT: 1 Visit                     Harrel Carina, DPT, CLT  Acute Rehabilitation Services Office: [phone] (Secure chat preferred)    Claudia Desanctis 09/12/2023, 3:31 PM

## 2023-09-12 NOTE — Progress Notes (Signed)
 Patient ID: Catherine Hoffman, female   DOB: Oct 04, 1940, 83 y.o.   MRN: [id number] She is moving her legs better today.  She looks fairly comfortable.  I think she is going to need to go to skilled nursing.  She has nobody at home and therefore I think she will need a short-term stay in skilled nursing facility.  Her pain is better controlled today.  I am pleased with her progress.

## 2023-09-12 NOTE — TOC Initial Note (Signed)
 Transition of Care [facility]) - Initial/Assessment Note    Patient Details  Name: Catherine Hoffman MRN: [id number] Date of Birth: Aug 13, 1940  Transition of [facility]) CM/SW Contact:    Jimmy Picket, LCSW Phone Number: 09/12/2023, 12:11 PM  Clinical Narrative:                  CSW received SNF consult. CSW met with pt at bedside. CSW introduced self and explained role at the hospital. Pt reports that PTA She lives at home alone. She uses a cane, rollator and wheelchair to ambulate at home and in the community. Pt was able to complete ADL's independently.   CSW reviewed PT/OT recommendations for SNF. Pt reports she is fine going to a SNF. Pt gave CSW permission to fax out to facilities in the area. Pt has no preference of facility at this time. CSW gave pt medicare.gov rating list to review. CSW explained insurance auth process.  CSW will continue to follow.  Expected Discharge Plan: Skilled Nursing Facility Barriers to Discharge: Continued Medical Work up   Patient Goals and CMS Choice Patient states their goals for this hospitalization and ongoing recovery are:: TO go to rehab CMS Medicare.gov Compare Post Acute Care list provided to:: Patient Choice offered to / list presented to : Patient      Expected Discharge Plan and Services       Living arrangements for the past 2 months: Apartment                                      Prior Living Arrangements/Services Living arrangements for the past 2 months: Apartment Lives with:: Self Patient language and need for interpreter reviewed:: Yes Do you feel safe going back to the place where you live?: Yes      Need for Family Participation in Patient Care: Yes (Comment) Care giver support system in place?: Yes (comment)   Criminal Activity/Legal Involvement Pertinent to Current Situation/Hospitalization: No - Comment as needed  Activities of Daily Living   ADL Screening (condition at time of admission) Independently  performs ADLs?: Yes (appropriate for developmental age) Is the patient deaf or have difficulty hearing?: No Does the patient have difficulty seeing, even when wearing glasses/contacts?: No Does the patient have difficulty concentrating, remembering, or making decisions?: No  Permission Sought/Granted Permission sought to share information with : Facility Industrial/product designer granted to share information with : Yes, Verbal Permission Granted     Permission granted to share info w AGENCY: SNF        Emotional Assessment Appearance:: Appears stated age Attitude/Demeanor/Rapport: Engaged Affect (typically observed): Appropriate Orientation: : Oriented to Self, Oriented to Place, Oriented to  Time, Oriented to Situation Alcohol / Substance Use: Not Applicable Psych Involvement: No (comment)  Admission diagnosis:  Spinal stenosis, lumbar region, with neurogenic claudication [M48.062] S/P lumbar laminectomy [Z98.890] Patient Active Problem List   Diagnosis Date Noted   S/P lumbar laminectomy 09/10/2023   Allergies 01/30/2023   Subacute cough 01/30/2023   Pre-op evaluation 06/10/2022   Greater trochanteric bursitis, left 05/20/2022   Myalgia 03/11/2022   Overactive bladder 03/11/2022   Right shoulder pain 01/28/2022   Degenerative arthritis of left knee 11/18/2021   Radial head fracture 11/18/2021   Breast pain, left 08/05/2021   Headache 04/15/2021   Acute sinusitis 12/06/2020   Atypical chest pain 09/06/2020   Primary osteoarthritis of right knee 09/05/2019  DDD (degenerative disc disease), lumbar 06/28/2018   Rash 03/18/2018   Venous insufficiency 10/21/2016   Routine general medical examination at a health care facility 02/27/2016   Asthma, mild intermittent 07/19/2015   Essential hypertension 09/28/2010   RENAL CALCULUS 08/08/2010   Hyperlipidemia 02/25/2009   Constipation 10/04/2008   Low back pain 10/04/2008   Chronic allergic rhinitis 09/21/2008    Obesity 06/30/2007   ACID REFLUX DISEASE 06/30/2007   PCP:  Myrlene Broker, MD Pharmacy:   [phone] [place], [place] - [address] AT [facility] [address] [place] [address] Phone: [phone] Fax: [phone]     Social Drivers of Health (SDOH) Social History: SDOH Screenings   Food Insecurity: No Food Insecurity (09/10/2023)  Housing: Low Risk  (09/10/2023)  Transportation Needs: No Transportation Needs (09/10/2023)  Utilities: Not At Risk (09/10/2023)  Alcohol Screen: Low Risk  (05/04/2023)  Depression (PHQ2-9): Low Risk  (05/04/2023)  Financial Resource Strain: Low Risk  (05/04/2023)  Physical Activity: Inactive (05/04/2023)  Social Connections: Moderately Isolated (09/10/2023)  Stress: No Stress Concern Present (05/04/2023)  Tobacco Use: Low Risk  (09/10/2023)  Health Literacy: Adequate Health Literacy (05/04/2023)   SDOH Interventions:     Readmission Risk Interventions     No data to display

## 2023-09-12 NOTE — NC FL2 (Signed)
 [place] MEDICAID FL2 LEVEL OF CARE FORM     IDENTIFICATION  Patient Name: Catherine Hoffman Birthdate: 02/12/1941 Sex: female Admission Date (Current Location): 09/10/2023  [facility] and [place] Number:  Producer, television/film/video and Address:  [facility] [place], [address], [place], [place] [address]      Provider Number: [id number]  Attending Physician Name and Address:  Arman Bogus, MD  Relative Name and Phone Number:       Current Level of Care: Hospital Recommended Level of Care: Skilled Nursing Facility Prior Approval Number:    Date Approved/Denied:   PASRR Number: [id number]  Discharge Plan: SNF    Current Diagnoses: Patient Active Problem List   Diagnosis Date Noted   S/P lumbar laminectomy 09/10/2023   Allergies 01/30/2023   Subacute cough 01/30/2023   Pre-op evaluation 06/10/2022   Greater trochanteric bursitis, left 05/20/2022   Myalgia 03/11/2022   Overactive bladder 03/11/2022   Right shoulder pain 01/28/2022   Degenerative arthritis of left knee 11/18/2021   Radial head fracture 11/18/2021   Breast pain, left 08/05/2021   Headache 04/15/2021   Acute sinusitis 12/06/2020   Atypical chest pain 09/06/2020   Primary osteoarthritis of right knee 09/05/2019   DDD (degenerative disc disease), lumbar 06/28/2018   Rash 03/18/2018   Venous insufficiency 10/21/2016   Routine general medical examination at a health care facility 02/27/2016   Asthma, mild intermittent 07/19/2015   Essential hypertension 09/28/2010   RENAL CALCULUS 08/08/2010   Hyperlipidemia 02/25/2009   Constipation 10/04/2008   Low back pain 10/04/2008   Chronic allergic rhinitis 09/21/2008   Obesity 06/30/2007   ACID REFLUX DISEASE 06/30/2007    Orientation RESPIRATION BLADDER Height & Weight     Self, Time, Situation  Normal Continent Weight: 189 lb 6 oz (85.9 kg) Height:  5\' 1"  (154.9 cm)  BEHAVIORAL SYMPTOMS/MOOD NEUROLOGICAL BOWEL NUTRITION STATUS       Continent Diet  AMBULATORY STATUS COMMUNICATION OF NEEDS Skin   Limited Assist Verbally Normal, Surgical wounds                       Personal Care Assistance Level of Assistance  Bathing, Feeding, Dressing Bathing Assistance: Limited assistance Feeding assistance: Independent Dressing Assistance: Limited assistance     Functional Limitations Info  Sight, Hearing, Speech Sight Info: Adequate Hearing Info: Adequate Speech Info: Adequate    SPECIAL CARE FACTORS FREQUENCY  PT (By licensed PT), OT (By licensed OT)     PT Frequency: 5x a week OT Frequency: 5x a week            Contractures      Additional Factors Info  Allergies, Code Status Code Status Info: Full Allergies Info: Hydrocodone-acetaminophen  Other  Tramadol           Current Medications (09/12/2023):  This is the current hospital active medication list Current Facility-Administered Medications  Medication Dose Route Frequency Provider Last Rate Last Admin   celecoxib (CELEBREX) capsule 200 mg  200 mg Oral Q12H Arman Bogus, MD   200 mg at 09/12/23 5621   estradiol (ESTRACE) tablet 0.5 mg  0.5 mg Oral Fonnie Mu, MD   0.5 mg at 09/12/23 3086   magnesium oxide (MAG-OX) tablet 200 mg  200 mg Oral Daily PRN Arman Bogus, MD       menthol-cetylpyridinium (CEPACOL) lozenge 3 mg  1 lozenge Oral PRN Arman Bogus, MD  Or   phenol (CHLORASEPTIC) mouth spray 1 spray  1 spray Mouth/Throat PRN Arman Bogus, MD       methocarbamol (ROBAXIN) tablet 500 mg  500 mg Oral Q6H PRN Arman Bogus, MD   500 mg at 09/11/23 1610   Or   methocarbamol (ROBAXIN) injection 500 mg  500 mg Intravenous Q6H PRN Arman Bogus, MD       morphine (PF) 2 MG/ML injection 2 mg  2 mg Intravenous Q2H PRN Arman Bogus, MD       ondansetron [facility]) tablet 4 mg  4 mg Oral Q6H PRN Arman Bogus, MD       Or   ondansetron [facility]) injection 4 mg  4 mg Intravenous Q6H PRN Arman Bogus, MD       oxybutynin (DITROPAN-XL) 24 hr tablet 5 mg  5 mg Oral Daily Arman Bogus, MD   5 mg at 09/12/23 9604   oxyCODONE (Oxy IR/ROXICODONE) immediate release tablet 5 mg  5 mg Oral Q3H PRN Arman Bogus, MD   5 mg at 09/11/23 2111   senna (SENOKOT) tablet 8.6 mg  1 tablet Oral BID Arman Bogus, MD   8.6 mg at 09/12/23 5409   sodium chloride flush (NS) 0.9 % injection 3-10 mL  3-10 mL Intravenous Q12H Arman Bogus, MD   10 mL at 09/12/23 8119   sodium chloride flush (NS) 0.9 % injection 3-10 mL  3-10 mL Intravenous PRN Arman Bogus, MD   10 mL at 09/10/23 2158   spironolactone (ALDACTONE) tablet 50 mg  50 mg Oral Daily Arman Bogus, MD   50 mg at 09/12/23 1478     Discharge Medications: Please see discharge summary for a list of discharge medications.  Relevant Imaging Results:  Relevant Lab Results:   Additional Information SSN [id number]  Jimmy Picket, LCSW

## 2023-09-12 NOTE — Plan of Care (Signed)

## 2023-09-13 ENCOUNTER — Encounter (HOSPITAL_COMMUNITY): Payer: Self-pay | Admitting: Neurological Surgery

## 2023-09-13 DIAGNOSIS — R296 Repeated falls: Secondary | ICD-10-CM | POA: Diagnosis not present

## 2023-09-13 DIAGNOSIS — J309 Allergic rhinitis, unspecified: Secondary | ICD-10-CM | POA: Diagnosis not present

## 2023-09-13 DIAGNOSIS — Z7401 Bed confinement status: Secondary | ICD-10-CM | POA: Diagnosis not present

## 2023-09-13 DIAGNOSIS — I959 Hypotension, unspecified: Secondary | ICD-10-CM | POA: Diagnosis not present

## 2023-09-13 DIAGNOSIS — M1712 Unilateral primary osteoarthritis, left knee: Secondary | ICD-10-CM | POA: Diagnosis not present

## 2023-09-13 DIAGNOSIS — M549 Dorsalgia, unspecified: Secondary | ICD-10-CM | POA: Diagnosis not present

## 2023-09-13 DIAGNOSIS — I872 Venous insufficiency (chronic) (peripheral): Secondary | ICD-10-CM | POA: Diagnosis not present

## 2023-09-13 DIAGNOSIS — E785 Hyperlipidemia, unspecified: Secondary | ICD-10-CM | POA: Diagnosis not present

## 2023-09-13 DIAGNOSIS — M6281 Muscle weakness (generalized): Secondary | ICD-10-CM | POA: Diagnosis not present

## 2023-09-13 DIAGNOSIS — I10 Essential (primary) hypertension: Secondary | ICD-10-CM | POA: Diagnosis not present

## 2023-09-13 DIAGNOSIS — Z9889 Other specified postprocedural states: Secondary | ICD-10-CM | POA: Diagnosis not present

## 2023-09-13 DIAGNOSIS — J4520 Mild intermittent asthma, uncomplicated: Secondary | ICD-10-CM | POA: Diagnosis not present

## 2023-09-13 DIAGNOSIS — M159 Polyosteoarthritis, unspecified: Secondary | ICD-10-CM | POA: Diagnosis not present

## 2023-09-13 DIAGNOSIS — M48061 Spinal stenosis, lumbar region without neurogenic claudication: Secondary | ICD-10-CM | POA: Diagnosis not present

## 2023-09-13 DIAGNOSIS — R2689 Other abnormalities of gait and mobility: Secondary | ICD-10-CM | POA: Diagnosis not present

## 2023-09-13 DIAGNOSIS — K219 Gastro-esophageal reflux disease without esophagitis: Secondary | ICD-10-CM | POA: Diagnosis not present

## 2023-09-13 DIAGNOSIS — M1711 Unilateral primary osteoarthritis, right knee: Secondary | ICD-10-CM | POA: Diagnosis not present

## 2023-09-13 DIAGNOSIS — M48062 Spinal stenosis, lumbar region with neurogenic claudication: Secondary | ICD-10-CM | POA: Diagnosis not present

## 2023-09-13 MED ORDER — OXYCODONE HCL 5 MG PO TABS
5.0000 mg | ORAL_TABLET | Freq: Four times a day (QID) | ORAL | 0 refills | Status: DC | PRN
Start: 2023-09-13 — End: 2023-09-29

## 2023-09-13 MED FILL — Thrombin For Soln 5000 Unit: CUTANEOUS | Qty: 5000 | Status: AC

## 2023-09-13 NOTE — Progress Notes (Signed)
 PT Cancellation Note  Patient Details Name: Catherine Hoffman MRN: [id number] DOB: [birth year]   Cancelled Treatment:    Reason Eval/Treat Not Completed: Fatigue/lethargy limiting ability to participate. PT attempted x 3. Pt reports not resting well last night. Declining PT participation stating she needs to sleep.   Ilda Foil 09/13/2023, 12:26 PM

## 2023-09-13 NOTE — Progress Notes (Signed)
 Nursing Discharge Note  Name: Catherine Hoffman MRN: [id number] DOB: [birth year]  Admit Date: 09/10/2023 Discharge Date: 09/13/2023  [name] to be discharged to a Skilled Nursing Facility per MD order.  AVS completed, placed in discharge packet for facility review. Discharge packet compiled for facility. Non-emergency ambulance transport arranged. Report called to Hiram Gash LPN at [facility]/ [phone].   Discharge Instructions   None      Allergies as of 09/13/2023       Reactions   Hydrocodone-acetaminophen Nausea And Vomiting   Other Hives, Rash   Fresh Fruit-facial rash/hives (bananas, grapes,etc) Red meat-upset stomach   Tramadol Itching        Medication List     TAKE these medications    acetaminophen 500 MG tablet Commonly known as: TYLENOL Take 1,000 mg by mouth every 6 (six) hours as needed (pain.).   albuterol 108 (90 Base) MCG/ACT inhaler Commonly known as: VENTOLIN HFA Inhale 2 puffs into the lungs every 6 (six) hours as needed for wheezing or shortness of breath.   aspirin 81 MG chewable tablet Commonly known as: [facility] Aspirin Adult Low Dose Chew 1 tablet (81 mg total) by mouth daily.   cetirizine 10 MG tablet Commonly known as: ZYRTEC TAKE 1 TABLET BY MOUTH EVERY DAY   estradiol 0.5 MG tablet Commonly known as: ESTRACE Take 0.5 mg by mouth every other day.   Fish Oil 1000 MG Caps Take 1,000 mg by mouth in the morning.   fluticasone 50 MCG/ACT nasal spray Commonly known as: FLONASE PLACE 2 SPRAYS INTO BOTH NOSTRILS DAILY. SHAKE LIQUID AND USE 2 SPRAYS IN EACH NOSTRIL DAILY What changed:  when to take this reasons to take this additional instructions   gabapentin 100 MG capsule Commonly known as: NEURONTIN Take 1-3 capsules (100-300 mg total) by mouth at bedtime as needed (nerve pain).   guaiFENesin-codeine 100-10 MG/5ML syrup Take 5 mLs by mouth every 6 (six) hours as needed for cough (Use also prior to MRI to reduce  cough).   latanoprost 0.005 % ophthalmic solution Commonly known as: XALATAN Place 1 drop into both eyes daily as needed (eye weakness).   MAGNESIUM PO Take 1 capsule by mouth daily as needed (cramps).   meloxicam 15 MG tablet Commonly known as: MOBIC TAKE 1 TABLET BY MOUTH EVERY DAY   montelukast 10 MG tablet Commonly known as: SINGULAIR TAKE 1 TABLET BY MOUTH EVERYDAY AT BEDTIME   omeprazole 40 MG capsule Commonly known as: PRILOSEC TAKE 1 CAPSULE BY MOUTH TWICE DAILY 30 MINUTES BEFORE FIRST AND LAST MEAL OF THE DAY What changed: See the new instructions.   oxybutynin 5 MG 24 hr tablet Commonly known as: DITROPAN-XL TAKE 1 TABLET BY MOUTH EVERYDAY AT BEDTIME What changed: See the new instructions.   oxyCODONE 5 MG immediate release tablet Commonly known as: Oxy IR/ROXICODONE Take 1 tablet (5 mg total) by mouth every 6 (six) hours as needed for moderate pain (pain score 4-6).   potassium chloride SA 20 MEQ tablet Commonly known as: Klor-Con M20 TAKE 1 TABLET BY MOUTH EVERY DAY   spironolactone 50 MG tablet Commonly known as: ALDACTONE TAKE 1 TABLET BY MOUTH EVERY DAY   Vitamin D3 50 MCG (2000 UT) Tabs Take 2,000 Units by mouth in the morning.         Discharge Instructions     Call MD for:  difficulty breathing, headache or visual disturbances   Complete by: As directed    Call  MD for:  persistant nausea and vomiting   Complete by: As directed    Call MD for:  redness, tenderness, or signs of infection (pain, swelling, redness, odor or green/yellow discharge around incision site)   Complete by: As directed    Call MD for:  severe uncontrolled pain   Complete by: As directed    Call MD for:  temperature >100.4   Complete by: As directed    Diet - low sodium heart healthy   Complete by: As directed    Increase activity slowly   Complete by: As directed    Remove dressing in 48 hours   Complete by: As directed           [facility] to provide  transportation to facility for patient. Non-emergency ambulance transport at bedside. Handoff completed with [facility] staff/EMTs.   Patient discharged from hospital unit via stretcher. Stable at time of discharge.

## 2023-09-13 NOTE — Discharge Summary (Signed)
 Physician Discharge Summary  Patient ID: Catherine Hoffman MRN: [id number] DOB/AGE: 10-Mar-1941 83 y.o.  Admit date: 09/10/2023 Discharge date: 09/13/2023  Admission Diagnoses: Spinal stenosis with radiculopathy    Discharge Diagnoses: Same   Discharged Condition: good  Hospital Course: The patient was admitted on 09/10/2023 and taken to the operating room where the patient underwent decompressive laminectomy L2-3 L3-4 L4-5 L5-S1. The patient tolerated the procedure well and was taken to the recovery room and then to the floor in stable condition. The hospital course was routine. There were no complications. The wound remained clean dry and intact. Pt had appropriate back soreness. No complaints of leg pain or new N/T/W. The patient remained afebrile with stable vital signs, and tolerated a regular diet. The patient continued to increase activities, and pain was well controlled with oral pain medications.  She made strides with physical and Occupational Therapy and was discharged to a skilled nursing facility on 09/13/2023  Consults: None  Significant Diagnostic Studies:  Results for orders placed or performed during the hospital encounter of 09/09/23  Surgical pcr screen   Collection Time: 09/09/23 10:18 AM   Specimen: Nasal Mucosa; Nasal Swab  Result Value Ref Range   MRSA, PCR NEGATIVE NEGATIVE   Staphylococcus aureus NEGATIVE NEGATIVE  Basic metabolic panel per protocol   Collection Time: 09/09/23 10:27 AM  Result Value Ref Range   Sodium 139 135 - 145 mmol/L   Potassium 4.1 3.5 - 5.1 mmol/L   Chloride 107 98 - 111 mmol/L   CO2 23 22 - 32 mmol/L   Glucose, Bld 105 (H) 70 - 99 mg/dL   BUN 19 8 - 23 mg/dL   Creatinine, Ser 4.09 (H) 0.44 - 1.00 mg/dL   Calcium 9.9 8.9 - 81.1 mg/dL   GFR, Estimated 43 (L) >60 mL/min   Anion gap 9 5 - 15  CBC per protocol   Collection Time: 09/09/23 10:27 AM  Result Value Ref Range   WBC 6.0 4.0 - 10.5 K/uL   RBC 3.98 3.87 - 5.11 MIL/uL   Hemoglobin  11.9 (L) 12.0 - 15.0 g/dL   HCT 91.4 78.2 - 95.6 %   MCV 96.0 80.0 - 100.0 fL   MCH 29.9 26.0 - 34.0 pg   MCHC 31.2 30.0 - 36.0 g/dL   RDW 21.3 08.6 - 57.8 %   Platelets 201 150 - 400 K/uL   nRBC 0.0 0.0 - 0.2 %  Protime-INR   Collection Time: 09/09/23 10:27 AM  Result Value Ref Range   Prothrombin Time 12.9 11.4 - 15.2 seconds   INR 1.0 0.8 - 1.2    DG Lumbar Spine 2-3 Views Result Date: 09/10/2023 CLINICAL DATA:  Elective surgery.  Intraoperative localization. EXAM: LUMBAR SPINE - 2-3 VIEW COMPARISON:  Lumbar spine radiograph dated 08/05/2023. FINDINGS: Two lateral images provided. On the later image the tip of the probe is at the level of the superior endplate of L4 vertebra. IMPRESSION: Intraoperative localization as above. Electronically Signed   By: Elgie Collard M.D.   On: 09/10/2023 14:33    Antibiotics:  Anti-infectives (From admission, onward)    Start     Dose/Rate Route Frequency Ordered Stop   09/11/23 0600  ceFAZolin (ANCEF) IVPB 2g/100 mL premix  Status:  Discontinued        2 g 200 mL/hr over 30 Minutes Intravenous On call to O.R. 09/10/23 1322 09/10/23 1726   09/10/23 1330  ceFAZolin (ANCEF) IVPB 2g/100 mL premix  2 g 200 mL/hr over 30 Minutes Intravenous Every 8 hours 09/10/23 1322 09/11/23 0529   09/10/23 0600  ceFAZolin (ANCEF) IVPB 2g/100 mL premix        2 g 200 mL/hr over 30 Minutes Intravenous On call to O.R. 09/10/23 0544 09/10/23 0752       Discharge Exam: Blood pressure (!) 103/54, pulse 63, temperature 99.8 F (37.7 C), temperature source Oral, resp. rate 16, height 5\' 1"  (1.549 m), weight 85.9 kg, SpO2 96%. Neurologic: Grossly normal Dressing clean dry and intact  Discharge Medications:   Allergies as of 09/13/2023       Reactions   Hydrocodone-acetaminophen Nausea And Vomiting   Other Hives, Rash   Fresh Fruit-facial rash/hives (bananas, grapes,etc) Red meat-upset stomach   Tramadol Itching        Medication List     TAKE  these medications    acetaminophen 500 MG tablet Commonly known as: TYLENOL Take 1,000 mg by mouth every 6 (six) hours as needed (pain.).   albuterol 108 (90 Base) MCG/ACT inhaler Commonly known as: VENTOLIN HFA Inhale 2 puffs into the lungs every 6 (six) hours as needed for wheezing or shortness of breath.   aspirin 81 MG chewable tablet Commonly known as: CVS Aspirin Adult Low Dose Chew 1 tablet (81 mg total) by mouth daily.   cetirizine 10 MG tablet Commonly known as: ZYRTEC TAKE 1 TABLET BY MOUTH EVERY DAY   estradiol 0.5 MG tablet Commonly known as: ESTRACE Take 0.5 mg by mouth every other day.   Fish Oil 1000 MG Caps Take 1,000 mg by mouth in the morning.   fluticasone 50 MCG/ACT nasal spray Commonly known as: FLONASE PLACE 2 SPRAYS INTO BOTH NOSTRILS DAILY. SHAKE LIQUID AND USE 2 SPRAYS IN EACH NOSTRIL DAILY What changed:  when to take this reasons to take this additional instructions   gabapentin 100 MG capsule Commonly known as: NEURONTIN Take 1-3 capsules (100-300 mg total) by mouth at bedtime as needed (nerve pain).   guaiFENesin-codeine 100-10 MG/5ML syrup Take 5 mLs by mouth every 6 (six) hours as needed for cough (Use also prior to MRI to reduce cough).   latanoprost 0.005 % ophthalmic solution Commonly known as: XALATAN Place 1 drop into both eyes daily as needed (eye weakness).   MAGNESIUM PO Take 1 capsule by mouth daily as needed (cramps).   meloxicam 15 MG tablet Commonly known as: MOBIC TAKE 1 TABLET BY MOUTH EVERY DAY   montelukast 10 MG tablet Commonly known as: SINGULAIR TAKE 1 TABLET BY MOUTH EVERYDAY AT BEDTIME   omeprazole 40 MG capsule Commonly known as: PRILOSEC TAKE 1 CAPSULE BY MOUTH TWICE DAILY 30 MINUTES BEFORE FIRST AND LAST MEAL OF THE DAY What changed: See the new instructions.   oxybutynin 5 MG 24 hr tablet Commonly known as: DITROPAN-XL TAKE 1 TABLET BY MOUTH EVERYDAY AT BEDTIME What changed: See the new  instructions.   oxyCODONE 5 MG immediate release tablet Commonly known as: Oxy IR/ROXICODONE Take 1 tablet (5 mg total) by mouth every 6 (six) hours as needed for moderate pain (pain score 4-6).   potassium chloride SA 20 MEQ tablet Commonly known as: Klor-Con M20 TAKE 1 TABLET BY MOUTH EVERY DAY   spironolactone 50 MG tablet Commonly known as: ALDACTONE TAKE 1 TABLET BY MOUTH EVERY DAY   Vitamin D3 50 MCG (2000 UT) Tabs Take 2,000 Units by mouth in the morning.        Disposition: SNF   Final Dx:  Lumbar laminectomy for stenosis  L2-3 L3-4 L4-5 L5-S1  Discharge Instructions     Call MD for:  difficulty breathing, headache or visual disturbances   Complete by: As directed    Call MD for:  persistant nausea and vomiting   Complete by: As directed    Call MD for:  redness, tenderness, or signs of infection (pain, swelling, redness, odor or green/yellow discharge around incision site)   Complete by: As directed    Call MD for:  severe uncontrolled pain   Complete by: As directed    Call MD for:  temperature >100.4   Complete by: As directed    Diet - low sodium heart healthy   Complete by: As directed    Increase activity slowly   Complete by: As directed    Remove dressing in 48 hours   Complete by: As directed           Signed: Tia Alert 09/13/2023, 12:41 PM

## 2023-09-13 NOTE — Progress Notes (Signed)
 Hemovac drain removed at this time with no issues. Occlusive dressing placed at insertion site.  Surgical incision dressing changed at this time.  Post-op visible/honeycomb dressing is clean dry and intact. Steri-strips remain at incision.

## 2023-09-13 NOTE — Progress Notes (Signed)
 Subjective: Patient reports doing better today, still feels like her legs are weak but she ambulated the hallways yesterday   Objective: Vital signs in last 24 hours: Temp:  [97.7 F (36.5 C)-98.3 F (36.8 C)] 98.3 F (36.8 C) (04/07 0747) Pulse Rate:  [55-88] 58 (04/07 0747) Resp:  [18-20] 18 (04/06 2326) BP: (112-128)/(51-63) 119/53 (04/07 0747) SpO2:  [94 %-100 %] 97 % (04/07 0747)  Intake/Output from previous day: 04/06 0701 - 04/07 0700 In: 3 [I.V.:3] Out: 32 [Urine:1; Drains:30; Stool:1] Intake/Output this shift: No intake/output data recorded.  Neurologic: Grossly normal  Lab Results: Lab Results  Component Value Date   WBC 6.0 09/09/2023   HGB 11.9 (L) 09/09/2023   HCT 38.2 09/09/2023   MCV 96.0 09/09/2023   PLT 201 09/09/2023   Lab Results  Component Value Date   INR 1.0 09/09/2023   BMET Lab Results  Component Value Date   NA 139 09/09/2023   K 4.1 09/09/2023   CL 107 09/09/2023   CO2 23 09/09/2023   GLUCOSE 105 (H) 09/09/2023   BUN 19 09/09/2023   CREATININE 1.24 (H) 09/09/2023   CALCIUM 9.9 09/09/2023    Studies/Results: No results found.  Assessment/Plan: Postop day 3 lumbar lami. Awaiting SNF placement. Continue therapy for now. Ok to discharge when bed is ready.   LOS: 0 days    Tiana Loft Chelesea Weiand 09/13/2023, 10:26 AM

## 2023-09-13 NOTE — TOC Transition Note (Signed)
 Transition of Care [facility]) - Discharge Note   Patient Details  Name: Catherine Hoffman MRN: [id number] Date of Birth: 21-Jan-1941  Transition [facility]) CM/SW Contact:  Baldemar Lenis, LCSW Phone Number: 09/13/2023, 1:54 PM   Clinical Narrative:   CSW met with patient to discuss SNF offers, patient chose [facility]. CSW confirmed with [facility] that they have a bed available. CSW requested CMA to initiate insurance authorization, and patient was approved. Patient stable to discharge to SNF today. Patient in agreement. Transport arranged with [facility] for next available.  Nurse to call report to [phone], Room 406B.    Final next level of care: Skilled Nursing Facility Barriers to Discharge: Barriers Resolved   Patient Goals and CMS Choice Patient states their goals for this hospitalization and ongoing recovery are:: TO go to rehab CMS Medicare.gov Compare Post Acute Care list provided to:: Patient Choice offered to / list presented to : Patient      Discharge Placement              Patient chooses bed at: [facility] Patient to be transferred to facility by: [facility] Name of family member notified: Self Patient and family notified of of transfer: 09/13/23  Discharge Plan and Services Additional resources added to the After Visit Summary for                                       Social Drivers of Health (SDOH) Interventions SDOH Screenings   Food Insecurity: No Food Insecurity (09/10/2023)  Housing: Low Risk  (09/10/2023)  Transportation Needs: No Transportation Needs (09/10/2023)  Utilities: Not At Risk (09/10/2023)  Alcohol Screen: Low Risk  (05/04/2023)  Depression (PHQ2-9): Low Risk  (05/04/2023)  Financial Resource Strain: Low Risk  (05/04/2023)  Physical Activity: Inactive (05/04/2023)  Social Connections: Moderately Isolated (09/10/2023)  Stress: No Stress Concern Present (05/04/2023)  Tobacco Use: Low Risk  (09/10/2023)  Health Literacy: Adequate Health  Literacy (05/04/2023)     Readmission Risk Interventions     No data to display

## 2023-09-14 DIAGNOSIS — M6281 Muscle weakness (generalized): Secondary | ICD-10-CM | POA: Diagnosis not present

## 2023-09-14 DIAGNOSIS — Z9889 Other specified postprocedural states: Secondary | ICD-10-CM | POA: Diagnosis not present

## 2023-09-14 DIAGNOSIS — I10 Essential (primary) hypertension: Secondary | ICD-10-CM | POA: Diagnosis not present

## 2023-09-14 DIAGNOSIS — R2689 Other abnormalities of gait and mobility: Secondary | ICD-10-CM | POA: Diagnosis not present

## 2023-09-14 DIAGNOSIS — M48061 Spinal stenosis, lumbar region without neurogenic claudication: Secondary | ICD-10-CM | POA: Diagnosis not present

## 2023-09-14 DIAGNOSIS — M159 Polyosteoarthritis, unspecified: Secondary | ICD-10-CM | POA: Diagnosis not present

## 2023-09-14 DIAGNOSIS — K219 Gastro-esophageal reflux disease without esophagitis: Secondary | ICD-10-CM | POA: Diagnosis not present

## 2023-09-14 DIAGNOSIS — J4520 Mild intermittent asthma, uncomplicated: Secondary | ICD-10-CM | POA: Diagnosis not present

## 2023-09-16 DIAGNOSIS — M159 Polyosteoarthritis, unspecified: Secondary | ICD-10-CM | POA: Diagnosis not present

## 2023-09-16 DIAGNOSIS — Z9889 Other specified postprocedural states: Secondary | ICD-10-CM | POA: Diagnosis not present

## 2023-09-16 DIAGNOSIS — K219 Gastro-esophageal reflux disease without esophagitis: Secondary | ICD-10-CM | POA: Diagnosis not present

## 2023-09-16 DIAGNOSIS — I10 Essential (primary) hypertension: Secondary | ICD-10-CM | POA: Diagnosis not present

## 2023-09-16 DIAGNOSIS — M48061 Spinal stenosis, lumbar region without neurogenic claudication: Secondary | ICD-10-CM | POA: Diagnosis not present

## 2023-09-17 DIAGNOSIS — R2689 Other abnormalities of gait and mobility: Secondary | ICD-10-CM | POA: Diagnosis not present

## 2023-09-17 DIAGNOSIS — J4520 Mild intermittent asthma, uncomplicated: Secondary | ICD-10-CM | POA: Diagnosis not present

## 2023-09-17 DIAGNOSIS — Z9889 Other specified postprocedural states: Secondary | ICD-10-CM | POA: Diagnosis not present

## 2023-09-17 DIAGNOSIS — M6281 Muscle weakness (generalized): Secondary | ICD-10-CM | POA: Diagnosis not present

## 2023-09-20 DIAGNOSIS — M159 Polyosteoarthritis, unspecified: Secondary | ICD-10-CM | POA: Diagnosis not present

## 2023-09-20 DIAGNOSIS — I10 Essential (primary) hypertension: Secondary | ICD-10-CM | POA: Diagnosis not present

## 2023-09-20 DIAGNOSIS — Z9889 Other specified postprocedural states: Secondary | ICD-10-CM | POA: Diagnosis not present

## 2023-09-20 DIAGNOSIS — M48061 Spinal stenosis, lumbar region without neurogenic claudication: Secondary | ICD-10-CM | POA: Diagnosis not present

## 2023-09-20 DIAGNOSIS — K219 Gastro-esophageal reflux disease without esophagitis: Secondary | ICD-10-CM | POA: Diagnosis not present

## 2023-09-21 DIAGNOSIS — J4520 Mild intermittent asthma, uncomplicated: Secondary | ICD-10-CM | POA: Diagnosis not present

## 2023-09-21 DIAGNOSIS — M159 Polyosteoarthritis, unspecified: Secondary | ICD-10-CM | POA: Diagnosis not present

## 2023-09-21 DIAGNOSIS — Z9889 Other specified postprocedural states: Secondary | ICD-10-CM | POA: Diagnosis not present

## 2023-09-21 DIAGNOSIS — I10 Essential (primary) hypertension: Secondary | ICD-10-CM | POA: Diagnosis not present

## 2023-09-21 DIAGNOSIS — R2689 Other abnormalities of gait and mobility: Secondary | ICD-10-CM | POA: Diagnosis not present

## 2023-09-21 DIAGNOSIS — M48061 Spinal stenosis, lumbar region without neurogenic claudication: Secondary | ICD-10-CM | POA: Diagnosis not present

## 2023-09-21 DIAGNOSIS — K219 Gastro-esophageal reflux disease without esophagitis: Secondary | ICD-10-CM | POA: Diagnosis not present

## 2023-09-21 DIAGNOSIS — M6281 Muscle weakness (generalized): Secondary | ICD-10-CM | POA: Diagnosis not present

## 2023-09-22 DIAGNOSIS — M159 Polyosteoarthritis, unspecified: Secondary | ICD-10-CM | POA: Diagnosis not present

## 2023-09-22 DIAGNOSIS — K219 Gastro-esophageal reflux disease without esophagitis: Secondary | ICD-10-CM | POA: Diagnosis not present

## 2023-09-22 DIAGNOSIS — I10 Essential (primary) hypertension: Secondary | ICD-10-CM | POA: Diagnosis not present

## 2023-09-22 DIAGNOSIS — M48061 Spinal stenosis, lumbar region without neurogenic claudication: Secondary | ICD-10-CM | POA: Diagnosis not present

## 2023-09-22 DIAGNOSIS — Z9889 Other specified postprocedural states: Secondary | ICD-10-CM | POA: Diagnosis not present

## 2023-09-22 DIAGNOSIS — R296 Repeated falls: Secondary | ICD-10-CM | POA: Diagnosis not present

## 2023-09-24 DIAGNOSIS — M48061 Spinal stenosis, lumbar region without neurogenic claudication: Secondary | ICD-10-CM | POA: Diagnosis not present

## 2023-09-24 DIAGNOSIS — I10 Essential (primary) hypertension: Secondary | ICD-10-CM | POA: Diagnosis not present

## 2023-09-24 DIAGNOSIS — J4520 Mild intermittent asthma, uncomplicated: Secondary | ICD-10-CM | POA: Diagnosis not present

## 2023-09-24 DIAGNOSIS — K219 Gastro-esophageal reflux disease without esophagitis: Secondary | ICD-10-CM | POA: Diagnosis not present

## 2023-09-24 DIAGNOSIS — M6281 Muscle weakness (generalized): Secondary | ICD-10-CM | POA: Diagnosis not present

## 2023-09-24 DIAGNOSIS — M159 Polyosteoarthritis, unspecified: Secondary | ICD-10-CM | POA: Diagnosis not present

## 2023-09-24 DIAGNOSIS — Z9889 Other specified postprocedural states: Secondary | ICD-10-CM | POA: Diagnosis not present

## 2023-09-24 DIAGNOSIS — R2689 Other abnormalities of gait and mobility: Secondary | ICD-10-CM | POA: Diagnosis not present

## 2023-09-26 ENCOUNTER — Other Ambulatory Visit: Payer: Self-pay | Admitting: Internal Medicine

## 2023-09-27 DIAGNOSIS — M1712 Unilateral primary osteoarthritis, left knee: Secondary | ICD-10-CM | POA: Diagnosis not present

## 2023-09-28 ENCOUNTER — Other Ambulatory Visit: Payer: Self-pay | Admitting: Neurological Surgery

## 2023-09-28 ENCOUNTER — Encounter (HOSPITAL_COMMUNITY): Payer: Self-pay | Admitting: Neurological Surgery

## 2023-09-28 ENCOUNTER — Other Ambulatory Visit: Payer: Self-pay

## 2023-09-28 NOTE — Progress Notes (Signed)
 Pt made aware of surgery time change for 09/29/23 1258-1405, aarival 1025, stoop drinking clear liquids by 0955, and to follow all porevious instructions given.

## 2023-09-28 NOTE — Progress Notes (Signed)
 SDW CALL  Patient was given pre-op instructions over the phone. The opportunity was given for the patient to ask questions. No further questions asked. Patient verbalized understanding of instructions given.   PCP - Stephan Edwards Cardiologist - denies  PPM/ICD - denies Device Orders -  Rep Notified -   Chest x-ray - 07/07/23 EKG - 09/09/23 Stress Test - denies ECHO - denies Cardiac Cath - denies  Sleep Study - denies CPAP -   Fasting Blood Sugar - na Checks Blood Sugar _____ times a day  Blood Thinner Instructions:na Aspirin  Instructions:hlod DOS  ERAS Protcol -clear liquids until 1045 PRE-SURGERY Ensure or G2-   COVID TEST- na   Anesthesia review: no  Patient denies shortness of breath, fever, cough and chest pain over the phone call   Special instructions:    Oral Hygiene is also important to reduce your risk of infection.  Remember - BRUSH YOUR TEETH THE MORNING OF SURGERY WITH YOUR REGULAR TOOTHPASTE

## 2023-09-29 ENCOUNTER — Other Ambulatory Visit: Payer: Self-pay

## 2023-09-29 ENCOUNTER — Encounter (HOSPITAL_COMMUNITY): Payer: Self-pay | Admitting: Neurological Surgery

## 2023-09-29 ENCOUNTER — Inpatient Hospital Stay (HOSPITAL_COMMUNITY)

## 2023-09-29 ENCOUNTER — Encounter (HOSPITAL_COMMUNITY)
Admission: RE | Disposition: A | Discharge status: Home / Self Care | Payer: Self-pay | Source: Home / Self Care | Attending: Neurological Surgery

## 2023-09-29 ENCOUNTER — Ambulatory Visit (HOSPITAL_COMMUNITY)
Admission: RE | Admit: 2023-09-29 | Discharge: 2023-09-29 | Disposition: A | Discharge status: Home / Self Care | Attending: Neurological Surgery | Admitting: Neurological Surgery

## 2023-09-29 ENCOUNTER — Inpatient Hospital Stay (HOSPITAL_BASED_OUTPATIENT_CLINIC_OR_DEPARTMENT_OTHER)

## 2023-09-29 DIAGNOSIS — J45909 Unspecified asthma, uncomplicated: Secondary | ICD-10-CM | POA: Diagnosis not present

## 2023-09-29 DIAGNOSIS — I10 Essential (primary) hypertension: Secondary | ICD-10-CM | POA: Insufficient documentation

## 2023-09-29 DIAGNOSIS — T81328A Disruption or dehiscence of closure of other specified internal operation (surgical) wound, initial encounter: Secondary | ICD-10-CM | POA: Diagnosis not present

## 2023-09-29 DIAGNOSIS — Y838 Other surgical procedures as the cause of abnormal reaction of the patient, or of later complication, without mention of misadventure at the time of the procedure: Secondary | ICD-10-CM | POA: Insufficient documentation

## 2023-09-29 DIAGNOSIS — E669 Obesity, unspecified: Secondary | ICD-10-CM | POA: Insufficient documentation

## 2023-09-29 DIAGNOSIS — Z9889 Other specified postprocedural states: Secondary | ICD-10-CM | POA: Diagnosis not present

## 2023-09-29 DIAGNOSIS — Z6831 Body mass index (BMI) 31.0-31.9, adult: Secondary | ICD-10-CM | POA: Insufficient documentation

## 2023-09-29 DIAGNOSIS — E785 Hyperlipidemia, unspecified: Secondary | ICD-10-CM | POA: Diagnosis not present

## 2023-09-29 DIAGNOSIS — K219 Gastro-esophageal reflux disease without esophagitis: Secondary | ICD-10-CM | POA: Diagnosis not present

## 2023-09-29 DIAGNOSIS — T8131XA Disruption of external operation (surgical) wound, not elsewhere classified, initial encounter: Secondary | ICD-10-CM | POA: Insufficient documentation

## 2023-09-29 HISTORY — PX: LUMBAR WOUND DEBRIDEMENT: SHX1988

## 2023-09-29 SURGERY — LUMBAR WOUND DEBRIDEMENT
Anesthesia: General

## 2023-09-29 MED ORDER — LIDOCAINE 2% (20 MG/ML) 5 ML SYRINGE
INTRAMUSCULAR | Status: DC | PRN
  Administered 2023-09-29: 80 mg via INTRAVENOUS

## 2023-09-29 MED ORDER — ACETAMINOPHEN 10 MG/ML IV SOLN
INTRAVENOUS | Status: DC | PRN
Start: 2023-09-29 — End: 2023-09-29
  Administered 2023-09-29: 1000 mg via INTRAVENOUS

## 2023-09-29 MED ORDER — PROPOFOL 10 MG/ML IV BOLUS
INTRAVENOUS | Status: DC | PRN
  Administered 2023-09-29: 80 mg via INTRAVENOUS

## 2023-09-29 MED ORDER — LIDOCAINE 2% (20 MG/ML) 5 ML SYRINGE
INTRAMUSCULAR | Status: AC
  Filled 2023-09-29: qty 5

## 2023-09-29 MED ORDER — OXYCODONE HCL 5 MG PO TABS: 5.0000 mg | ORAL_TABLET | Freq: Once | ORAL | Status: AC | PRN

## 2023-09-29 MED ORDER — SUGAMMADEX SODIUM 200 MG/2ML IV SOLN
INTRAVENOUS | Status: DC | PRN
  Administered 2023-09-29: 200 mg via INTRAVENOUS

## 2023-09-29 MED ORDER — CHLORHEXIDINE GLUCONATE CLOTH 2 % EX PADS: 6.0000 | MEDICATED_PAD | Freq: Once | CUTANEOUS | Status: DC

## 2023-09-29 MED ORDER — FENTANYL CITRATE (PF) 250 MCG/5ML IJ SOLN
INTRAMUSCULAR | Status: AC
  Filled 2023-09-29: qty 5

## 2023-09-29 MED ORDER — OXYCODONE HCL 5 MG/5ML PO SOLN
ORAL | Status: AC
  Filled 2023-09-29: qty 5

## 2023-09-29 MED ORDER — ONDANSETRON HCL 4 MG/2ML IJ SOLN
INTRAMUSCULAR | Status: DC | PRN
  Administered 2023-09-29: 4 mg via INTRAVENOUS

## 2023-09-29 MED ORDER — FENTANYL CITRATE (PF) 100 MCG/2ML IJ SOLN
INTRAMUSCULAR | Status: AC
  Filled 2023-09-29: qty 2

## 2023-09-29 MED ORDER — PROPOFOL 10 MG/ML IV BOLUS
INTRAVENOUS | Status: AC
  Filled 2023-09-29: qty 20

## 2023-09-29 MED ORDER — PHENYLEPHRINE 80 MCG/ML (10ML) SYRINGE FOR IV PUSH (FOR BLOOD PRESSURE SUPPORT)
PREFILLED_SYRINGE | INTRAVENOUS | Status: DC | PRN
  Administered 2023-09-29 (×4): 80 ug via INTRAVENOUS

## 2023-09-29 MED ORDER — OXYCODONE HCL 5 MG PO TABS: 5.0000 mg | ORAL_TABLET | Freq: Four times a day (QID) | ORAL | 0 refills | Status: DC | PRN

## 2023-09-29 MED ORDER — CHLORHEXIDINE GLUCONATE 0.12 % MT SOLN
15.0000 mL | Freq: Once | OROMUCOSAL | Status: AC
  Administered 2023-09-29: 15 mL via OROMUCOSAL
  Filled 2023-09-29: qty 15

## 2023-09-29 MED ORDER — ONDANSETRON HCL 4 MG/2ML IJ SOLN
INTRAMUSCULAR | Status: AC
  Filled 2023-09-29: qty 2

## 2023-09-29 MED ORDER — LACTATED RINGERS IV SOLN: INTRAVENOUS | Status: DC

## 2023-09-29 MED ORDER — ORAL CARE MOUTH RINSE: 15.0000 mL | Freq: Once | OROMUCOSAL | Status: AC

## 2023-09-29 MED ORDER — ACETAMINOPHEN 10 MG/ML IV SOLN
INTRAVENOUS | Status: AC
  Filled 2023-09-29: qty 100

## 2023-09-29 MED ORDER — FENTANYL CITRATE (PF) 100 MCG/2ML IJ SOLN
25.0000 ug | INTRAMUSCULAR | Status: DC | PRN
  Administered 2023-09-29 (×2): 25 ug via INTRAVENOUS
  Administered 2023-09-29: 50 ug via INTRAVENOUS

## 2023-09-29 MED ORDER — DEXAMETHASONE SODIUM PHOSPHATE 10 MG/ML IJ SOLN
INTRAMUSCULAR | Status: AC
  Filled 2023-09-29: qty 1

## 2023-09-29 MED ORDER — ROCURONIUM BROMIDE 10 MG/ML (PF) SYRINGE
PREFILLED_SYRINGE | INTRAVENOUS | Status: DC | PRN
  Administered 2023-09-29: 60 mg via INTRAVENOUS

## 2023-09-29 MED ORDER — BUPIVACAINE HCL (PF) 0.25 % IJ SOLN
INTRAMUSCULAR | Status: DC | PRN
  Administered 2023-09-29: 10 mL

## 2023-09-29 MED ORDER — OXYCODONE HCL 5 MG/5ML PO SOLN
5.0000 mg | Freq: Once | ORAL | Status: AC | PRN
  Administered 2023-09-29: 5 mg via ORAL

## 2023-09-29 MED ORDER — DROPERIDOL 2.5 MG/ML IJ SOLN: 0.6250 mg | Freq: Once | INTRAMUSCULAR | Status: DC | PRN

## 2023-09-29 MED ORDER — VASHE WOUND IRRIGATION OPTIME
TOPICAL | Status: DC | PRN
  Administered 2023-09-29: 34 [oz_av]

## 2023-09-29 MED ORDER — DEXAMETHASONE SODIUM PHOSPHATE 10 MG/ML IJ SOLN
INTRAMUSCULAR | Status: DC | PRN
  Administered 2023-09-29: 5 mg via INTRAVENOUS

## 2023-09-29 MED ORDER — PHENYLEPHRINE 80 MCG/ML (10ML) SYRINGE FOR IV PUSH (FOR BLOOD PRESSURE SUPPORT)
PREFILLED_SYRINGE | INTRAVENOUS | Status: AC
  Filled 2023-09-29: qty 10

## 2023-09-29 MED ORDER — 0.9 % SODIUM CHLORIDE (POUR BTL) OPTIME
TOPICAL | Status: DC | PRN
  Administered 2023-09-29: 1000 mL

## 2023-09-29 MED ORDER — BUPIVACAINE HCL (PF) 0.25 % IJ SOLN
INTRAMUSCULAR | Status: AC
  Filled 2023-09-29: qty 30

## 2023-09-29 MED ORDER — FENTANYL CITRATE (PF) 250 MCG/5ML IJ SOLN
INTRAMUSCULAR | Status: DC | PRN
  Administered 2023-09-29: 50 ug via INTRAVENOUS
  Administered 2023-09-29: 100 ug via INTRAVENOUS

## 2023-09-29 MED ORDER — ROCURONIUM BROMIDE 10 MG/ML (PF) SYRINGE
PREFILLED_SYRINGE | INTRAVENOUS | Status: AC
  Filled 2023-09-29: qty 10

## 2023-09-29 MED ORDER — CEFAZOLIN SODIUM-DEXTROSE 2-4 GM/100ML-% IV SOLN
2.0000 g | INTRAVENOUS | Status: AC
  Administered 2023-09-29: 2 g via INTRAVENOUS
  Filled 2023-09-29: qty 100

## 2023-09-29 SURGICAL SUPPLY — 43 items
BAG COUNTER SPONGE SURGICOUNT (BAG) ×2 IMPLANT
BENZOIN TINCTURE PRP APPL 2/3 (GAUZE/BANDAGES/DRESSINGS) ×2 IMPLANT
CANISTER SUCT 3000ML PPV (MISCELLANEOUS) ×2 IMPLANT
CLEANSER WND VASHE 34 (WOUND CARE) IMPLANT
DRAPE LAPAROTOMY 100X72X124 (DRAPES) ×2 IMPLANT
DRESSING PEEL AND PLC PRVNA 13 (GAUZE/BANDAGES/DRESSINGS) IMPLANT
DURAPREP 26ML APPLICATOR (WOUND CARE) IMPLANT
DURAPREP 6ML APPLICATOR 50/CS (WOUND CARE) IMPLANT
ELECTRODE REM PT RTRN 9FT ADLT (ELECTROSURGICAL) ×2 IMPLANT
GAUZE 4X4 16PLY ~~LOC~~+RFID DBL (SPONGE) IMPLANT
GLOVE BIO SURGEON STRL SZ7 (GLOVE) IMPLANT
GLOVE BIO SURGEON STRL SZ8 (GLOVE) ×2 IMPLANT
GLOVE BIOGEL PI IND STRL 7.0 (GLOVE) IMPLANT
GOWN STRL REUS W/ TWL LRG LVL3 (GOWN DISPOSABLE) IMPLANT
GOWN STRL REUS W/ TWL XL LVL3 (GOWN DISPOSABLE) IMPLANT
GOWN STRL REUS W/TWL 2XL LVL3 (GOWN DISPOSABLE) ×2 IMPLANT
KIT BASIN OR (CUSTOM PROCEDURE TRAY) ×2 IMPLANT
KIT DRSG PREVENA PLUS 7DAY 125 (MISCELLANEOUS) IMPLANT
KIT TURNOVER KIT B (KITS) ×2 IMPLANT
NDL HYPO 18GX1.5 BLUNT FILL (NEEDLE) IMPLANT
NDL HYPO 25X1 1.5 SAFETY (NEEDLE) ×2 IMPLANT
NDL SPNL 20GX3.5 QUINCKE YW (NEEDLE) IMPLANT
NEEDLE HYPO 18GX1.5 BLUNT FILL (NEEDLE) IMPLANT
NEEDLE HYPO 25X1 1.5 SAFETY (NEEDLE) IMPLANT
NEEDLE SPNL 20GX3.5 QUINCKE YW (NEEDLE) IMPLANT
NS IRRIG 1000ML POUR BTL (IV SOLUTION) ×2 IMPLANT
PACK LAMINECTOMY NEURO (CUSTOM PROCEDURE TRAY) ×2 IMPLANT
PAD ARMBOARD POSITIONER FOAM (MISCELLANEOUS) ×6 IMPLANT
PATTIES SURGICAL .5 X.5 (GAUZE/BANDAGES/DRESSINGS) ×2 IMPLANT
PATTIES SURGICAL 1X1 (DISPOSABLE) ×2 IMPLANT
POWDER MYRIAD MORCELLS 1000MG (Miscellaneous) IMPLANT
POWDER MYRIAD MORCLLS FINE 500 (Miscellaneous) IMPLANT
STRIP CLOSURE SKIN 1/2X4 (GAUZE/BANDAGES/DRESSINGS) ×2 IMPLANT
SUT VIC AB 0 CT1 18XCR BRD8 (SUTURE) ×2 IMPLANT
SUT VIC AB 2-0 CP2 18 (SUTURE) ×2 IMPLANT
SUT VIC AB 3-0 SH 8-18 (SUTURE) ×2 IMPLANT
SWAB COLLECTION DEVICE MRSA (MISCELLANEOUS) ×2 IMPLANT
SWAB CULTURE ESWAB REG 1ML (MISCELLANEOUS) ×2 IMPLANT
SYR 30ML SLIP (SYRINGE) ×2 IMPLANT
SYR 3ML LL SCALE MARK (SYRINGE) IMPLANT
TOWEL GREEN STERILE (TOWEL DISPOSABLE) ×2 IMPLANT
TOWEL GREEN STERILE FF (TOWEL DISPOSABLE) ×2 IMPLANT
WATER STERILE IRR 1000ML POUR (IV SOLUTION) ×2 IMPLANT

## 2023-09-29 NOTE — Anesthesia Procedure Notes (Signed)
 Procedure Name: Intubation Date/Time: 09/29/2023 2:24 PM  Performed by: Adyline Huberty J, CRNAPre-anesthesia Checklist: Patient identified, Emergency Drugs available, Suction available and Patient being monitored Patient Re-evaluated:Patient Re-evaluated prior to induction Oxygen Delivery Method: Circle System Utilized Preoxygenation: Pre-oxygenation with 100% oxygen Induction Type: IV induction Ventilation: Mask ventilation without difficulty Laryngoscope Size: Miller and 3 Grade View: Grade I Tube type: Oral Tube size: 7.0 mm Number of attempts: 1 Airway Equipment and Method: Stylet and Oral airway Placement Confirmation: ETT inserted through vocal cords under direct vision, positive ETCO2 and breath sounds checked- equal and bilateral Secured at: 21 cm Tube secured with: Tape Dental Injury: Teeth and Oropharynx as per pre-operative assessment

## 2023-09-29 NOTE — Anesthesia Preprocedure Evaluation (Addendum)
 Anesthesia Evaluation  Patient identified by MRN, date of birth, ID band Patient awake    Reviewed: Allergy  & Precautions, H&P , NPO status , Patient's Chart, lab work & pertinent test results  Airway Mallampati: III  TM Distance: >3 FB Neck ROM: Full    Dental  (+) Dental Advisory Given, Upper Dentures, Missing   Pulmonary asthma    Pulmonary exam normal breath sounds clear to auscultation       Cardiovascular hypertension, Pt. on medications (-) angina (-) Past MI Normal cardiovascular exam Rhythm:Regular Rate:Normal     Neuro/Psych  Headaches Spinal stenosis, lumbar region, with neurogenic claudication  negative psych ROS   GI/Hepatic Neg liver ROS,GERD  Medicated and Controlled,,  Endo/Other  negative endocrine ROS  Obesity   Renal/GU Renal disease  negative genitourinary   Musculoskeletal  (+) Arthritis ,    Abdominal   Peds negative pediatric ROS (+)  Hematology negative hematology ROS (+)   Anesthesia Other Findings Original surgery on 4/4. Return for wound dehiscence.   Reproductive/Obstetrics negative OB ROS                             Anesthesia Physical Anesthesia Plan  ASA: 3  Anesthesia Plan: General   Post-op Pain Management: Ofirmev  IV (intra-op)*   Induction: Intravenous  PONV Risk Score and Plan: 3 and Dexamethasone  and Ondansetron   Airway Management Planned: Oral ETT  Additional Equipment:   Intra-op Plan:   Post-operative Plan: Extubation in OR  Informed Consent: I have reviewed the patients History and Physical, chart, labs and discussed the procedure including the risks, benefits and alternatives for the proposed anesthesia with the patient or authorized representative who has indicated his/her understanding and acceptance.     Dental advisory given  Plan Discussed with: CRNA  Anesthesia Plan Comments:        Anesthesia Quick Evaluation

## 2023-09-29 NOTE — Anesthesia Postprocedure Evaluation (Signed)
 Anesthesia Post Note  Patient: Catherine Hoffman  Procedure(s) Performed: Irrigation and debridement of lumbar wound     Patient location during evaluation: PACU Anesthesia Type: General Level of consciousness: awake and alert Pain management: pain level controlled Vital Signs Assessment: post-procedure vital signs reviewed and stable Respiratory status: spontaneous breathing, nonlabored ventilation, respiratory function stable and patient connected to nasal cannula oxygen Cardiovascular status: blood pressure returned to baseline and stable Postop Assessment: no apparent nausea or vomiting Anesthetic complications: no   No notable events documented.  Last Vitals:  Vitals:   09/29/23 1615 09/29/23 1630  BP: (!) 115/45 (!) 117/55  Pulse: 63 (!) 57  Resp: 15 16  Temp:  37 C  SpO2: 96% 98%    Last Pain:  Vitals:   09/29/23 1545  TempSrc:   PainSc: 8                  Lethaniel Rave

## 2023-09-29 NOTE — Discharge Summary (Signed)
 Physician Discharge Summary  Patient ID: Catherine Hoffman MRN: [id number] DOB/AGE: May 04, 1941 83 y.o.  Admit date: 09/29/2023 Discharge date: 09/29/2023  Admission Diagnoses: Wound dehiscence    Discharge Diagnoses: Same   Discharged Condition: good  Hospital Course: The patient was admitted on 09/29/2023 and taken to the operating room where the patient underwent irrigation and debridement of lumbar wound with placement of external wound VAC. The patient tolerated the procedure well and was taken to the recovery room in stable condition. The hospital course was routine. There were no complications. The wound remained clean dry and intact. Pt had appropriate back soreness. No complaints of leg pain or new N/T/W. The patient remained afebrile with stable vital signs, and tolerated a regular diet. The patient continued to increase activities, and pain was well controlled with oral pain medications.   Consults: None  Significant Diagnostic Studies:  Results for orders placed or performed during the hospital encounter of 09/09/23  Surgical pcr screen   Collection Time: 09/09/23 10:18 AM   Specimen: Nasal Mucosa; Nasal Swab  Result Value Ref Range   MRSA, PCR NEGATIVE NEGATIVE   Staphylococcus aureus NEGATIVE NEGATIVE  Basic metabolic panel per protocol   Collection Time: 09/09/23 10:27 AM  Result Value Ref Range   Sodium 139 135 - 145 mmol/L   Potassium 4.1 3.5 - 5.1 mmol/L   Chloride 107 98 - 111 mmol/L   CO2 23 22 - 32 mmol/L   Glucose, Bld 105 (H) 70 - 99 mg/dL   BUN 19 8 - 23 mg/dL   Creatinine, Ser 1.19 (H) 0.44 - 1.00 mg/dL   Calcium 9.9 8.9 - 14.7 mg/dL   GFR, Estimated 43 (L) >60 mL/min   Anion gap 9 5 - 15  CBC per protocol   Collection Time: 09/09/23 10:27 AM  Result Value Ref Range   WBC 6.0 4.0 - 10.5 K/uL   RBC 3.98 3.87 - 5.11 MIL/uL   Hemoglobin 11.9 (L) 12.0 - 15.0 g/dL   HCT 82.9 56.2 - 13.0 %   MCV 96.0 80.0 - 100.0 fL   MCH 29.9 26.0 - 34.0 pg   MCHC 31.2  30.0 - 36.0 g/dL   RDW 86.5 78.4 - 69.6 %   Platelets 201 150 - 400 K/uL   nRBC 0.0 0.0 - 0.2 %  Protime-INR   Collection Time: 09/09/23 10:27 AM  Result Value Ref Range   Prothrombin Time 12.9 11.4 - 15.2 seconds   INR 1.0 0.8 - 1.2    DG Lumbar Spine 2-3 Views Result Date: 09/10/2023 CLINICAL DATA:  Elective surgery.  Intraoperative localization. EXAM: LUMBAR SPINE - 2-3 VIEW COMPARISON:  Lumbar spine radiograph dated 08/05/2023. FINDINGS: Two lateral images provided. On the later image the tip of the probe is at the level of the superior endplate of L4 vertebra. IMPRESSION: Intraoperative localization as above. Electronically Signed   By: Angus Bark M.D.   On: 09/10/2023 14:33    Antibiotics:  Anti-infectives (From admission, onward)    Start     Dose/Rate Route Frequency Ordered Stop   09/29/23 1030  ceFAZolin  (ANCEF ) IVPB 2g/100 mL premix        2 g 200 mL/hr over 30 Minutes Intravenous On call to O.R. 09/29/23 1024 09/29/23 1431       Discharge Exam: Blood pressure 126/65, pulse 73, temperature 98.1 F (36.7 C), temperature source Oral, resp. rate 18, height 5\' 1"  (1.549 m), weight 76.7 kg, SpO2 98%. Neurologic: Grossly normal   Discharge  Medications:   Allergies as of 09/29/2023       Reactions   Hydrocodone -acetaminophen  Nausea And Vomiting   Other Hives, Rash   Fresh Fruit-facial rash/hives (bananas, grapes,etc) Red meat-upset stomach   Tramadol  Itching        Medication List     TAKE these medications    acetaminophen  500 MG tablet Commonly known as: TYLENOL  Take 1,000 mg by mouth every 6 (six) hours as needed (pain.).   albuterol  108 (90 Base) MCG/ACT inhaler Commonly known as: VENTOLIN  HFA Inhale 2 puffs into the lungs every 6 (six) hours as needed for wheezing or shortness of breath.   aspirin  81 MG chewable tablet Commonly known as: CVS Aspirin  Adult Low Dose Chew 1 tablet (81 mg total) by mouth daily.   cetirizine  10 MG tablet Commonly  known as: ZYRTEC  TAKE 1 TABLET BY MOUTH EVERY DAY   estradiol  0.5 MG tablet Commonly known as: ESTRACE  Take 0.5 mg by mouth every other day.   Fish Oil 1000 MG Caps Take 1,000 mg by mouth in the morning.   fluticasone  50 MCG/ACT nasal spray Commonly known as: FLONASE  PLACE 2 SPRAYS INTO BOTH NOSTRILS DAILY. SHAKE LIQUID AND USE 2 SPRAYS IN EACH NOSTRIL DAILY   gabapentin  100 MG capsule Commonly known as: NEURONTIN  Take 1-3 capsules (100-300 mg total) by mouth at bedtime as needed (nerve pain). What changed: how much to take   guaiFENesin -codeine  100-10 MG/5ML syrup Take 5 mLs by mouth every 6 (six) hours as needed for cough (Use also prior to MRI to reduce cough).   latanoprost  0.005 % ophthalmic solution Commonly known as: XALATAN  Place 1 drop into both eyes daily as needed (eye weakness).   MAGNESIUM  PO Take 1 capsule by mouth daily as needed (cramps).   meloxicam  15 MG tablet Commonly known as: MOBIC  TAKE 1 TABLET BY MOUTH EVERY DAY   montelukast  10 MG tablet Commonly known as: SINGULAIR  TAKE 1 TABLET BY MOUTH EVERYDAY AT BEDTIME   omeprazole  40 MG capsule Commonly known as: PRILOSEC TAKE 1 CAPSULE BY MOUTH TWICE DAILY 30 MINUTES BEFORE FIRST AND LAST MEAL OF THE DAY What changed: See the new instructions.   oxybutynin  5 MG 24 hr tablet Commonly known as: DITROPAN -XL TAKE 1 TABLET BY MOUTH EVERYDAY AT BEDTIME   oxyCODONE  5 MG immediate release tablet Commonly known as: Oxy IR/ROXICODONE  Take 1 tablet (5 mg total) by mouth every 6 (six) hours as needed for moderate pain (pain score 4-6).   potassium chloride  SA 20 MEQ tablet Commonly known as: Klor-Con  M20 TAKE 1 TABLET BY MOUTH EVERY DAY   spironolactone  50 MG tablet Commonly known as: ALDACTONE  TAKE 1 TABLET BY MOUTH EVERY DAY   Vitamin D3 50 MCG (2000 UT) Tabs Take 2,000 Units by mouth in the morning.        Disposition: Home   Final Dx: Irrigation and debridement of lumbar wound with placement  of external wound VAC  Discharge Instructions     Call MD for:  persistant nausea and vomiting   Complete by: As directed    Call MD for:  severe uncontrolled pain   Complete by: As directed    Call MD for:  temperature >100.4   Complete by: As directed    Diet - low sodium heart healthy   Complete by: As directed    Discharge instructions   Complete by: As directed    Remove wound VAC when the battery dies in about 7 days   Increase  activity slowly   Complete by: As directed    No wound care   Complete by: As directed         Follow-up Information     Joaquin Mulberry, MD. Schedule an appointment as soon as possible for a visit in 2 week(s).   Specialty: Neurosurgery Contact information: [address] [place] [address] [phone]                  Signed: Isadora Mar 09/29/2023, 3:22 PM

## 2023-09-29 NOTE — Transfer of Care (Signed)
 Immediate Anesthesia Transfer of Care Note  Patient: Catherine Hoffman  Procedure(s) Performed: Irrigation and debridement of lumbar wound  Patient Location: PACU  Anesthesia Type:General  Level of Consciousness: awake, alert , and oriented  Airway & Oxygen Therapy: Patient Spontanous Breathing and Patient connected to face mask oxygen  Post-op Assessment: Report given to RN and Post -op Vital signs reviewed and stable  Post vital signs: Reviewed and stable  Last Vitals:  Vitals Value Taken Time  BP 115/50 09/29/23 1524  Temp    Pulse 71 09/29/23 1527  Resp 16 09/29/23 1527  SpO2 96 % 09/29/23 1527  Vitals shown include unfiled device data.  Last Pain:  Vitals:   09/29/23 1105  TempSrc:   PainSc: 10-Worst pain ever         Complications: No notable events documented.

## 2023-09-29 NOTE — H&P (Signed)
 Subjective: Patient is a 83 y.o. female admitted for really healing lumbar wound. Onset of symptoms was a few days ago, unchanged since that time.  The pain is rated moderate, and is located at the across the lower back. The pain is described as burning and occurs all day. The symptoms have been progressive. Symptoms are exacerbated by nothing in particular.  She came into her first postoperative visit yesterday and upon wound examination was found to have a small wound dehiscence with eschar for about 4 cm below this.  There was no evidence of significant infection but certainly she had a poorly healing wound and we felt primary wound revision was a better option than healing by secondary intention through wound care.  Past Medical History:  Diagnosis Date   Arthritis    Cataract    removed both eyes    Episodic tension type headache    Esophageal reflux    GERD (gastroesophageal reflux disease)    Glaucoma    Heart murmur    as young adult   Hyperlipidemia    Hypertension    Internal hemorrhoids    Kidney stone on left side Hx-date unknown   Left rotator cuff tear     Past Surgical History:  Procedure Laterality Date   COLONOSCOPY  11/20/2008   COLONOSCOPY     EYE SURGERY Bilateral    cataract   KNEE ARTHROSCOPY Left 2004   LUMBAR LAMINECTOMY/DECOMPRESSION MICRODISCECTOMY Left 09/10/2023   Procedure: LUMBAR LAMINECTOMY/FORAMINATOMY LEFT LUMBAR TWO-LUMBAR THREE LUMBAR THREE-LUMBAR FOUR LUMBAR FOUR-LUMBAR FIVE BILATERAL LUMBAR FIVE-SACRAL-ONE;  Surgeon: Joaquin Mulberry, MD;  Location: [facility] OR;  Service: Neurosurgery;  Laterality: Left;   SHOULDER OPEN ROTATOR CUFF REPAIR Left 08/30/2012   Procedure: ROTATOR CUFF REPAIR SHOULDER OPEN, POSSIBLE GRAFT AND ANCHORS;  Surgeon: Florencia Hunter, MD;  Location: [facility];  Service: Orthopedics;  Laterality: Left;   TOTAL ABDOMINAL HYSTERECTOMY W/ BILATERAL SALPINGOOPHORECTOMY  1990's   TOTAL KNEE ARTHROPLASTY Right  09/05/2019   Procedure: RIGHT TOTAL KNEE ARTHROPLASTY;  Surgeon: Dayne Even, MD;  Location: [facility];  Service: Orthopedics;  Laterality: Right;    Prior to Admission medications   Medication Sig Start Date End Date Taking? Authorizing Provider  acetaminophen  (TYLENOL ) 500 MG tablet Take 1,000 mg by mouth every 6 (six) hours as needed (pain.).   Yes [provider]  aspirin  ([employer]S ASPIRIN  ADULT LOW DOSE) 81 MG chewable tablet Chew 1 tablet (81 mg total) by mouth daily. 06/16/22  Yes Adelia Homestead, MD  cetirizine  (ZYRTEC ) 10 MG tablet TAKE 1 TABLET BY MOUTH EVERY DAY 08/26/21  Yes Adelia Homestead, MD  Cholecalciferol (VITAMIN D3) 50 MCG (2000 UT) TABS Take 2,000 Units by mouth in the morning.   Yes [provider]  estradiol  (ESTRACE ) 0.5 MG tablet Take 0.5 mg by mouth every other day. 10/11/22  Yes [provider]  gabapentin  (NEURONTIN ) 100 MG capsule Take 1-3 capsules (100-300 mg total) by mouth at bedtime as needed (nerve pain). Patient taking differently: Take 100 mg by mouth at bedtime as needed (nerve pain). 06/16/23  Yes Syliva Even, MD  latanoprost  (XALATAN ) 0.005 % ophthalmic solution Place 1 drop into both eyes daily as needed (eye weakness).   Yes [provider]  MAGNESIUM  PO Take 1 capsule by mouth daily as needed (cramps).   Yes [provider]  meloxicam  (MOBIC ) 15 MG tablet TAKE 1 TABLET BY MOUTH EVERY DAY 08/26/23  Yes Adelia Homestead, MD  montelukast  (  SINGULAIR ) 10 MG tablet TAKE 1 TABLET BY MOUTH EVERYDAY AT BEDTIME 09/27/23  Yes Adelia Homestead, MD  Omega-3 Fatty Acids (FISH OIL) 1000 MG CAPS Take 1,000 mg by mouth in the morning.   Yes [provider]  omeprazole  (PRILOSEC) 40 MG capsule TAKE 1 CAPSULE BY MOUTH TWICE DAILY 30 MINUTES BEFORE FIRST AND LAST MEAL OF THE DAY Patient taking differently: Take 40 mg by mouth in the morning. 02/09/23  Yes Adelia Homestead, MD  oxybutynin  (DITROPAN -XL) 5 MG 24  hr tablet TAKE 1 TABLET BY MOUTH EVERYDAY AT BEDTIME 09/13/23  Yes Adelia Homestead, MD  oxyCODONE  (OXY IR/ROXICODONE ) 5 MG immediate release tablet Take 1 tablet (5 mg total) by mouth every 6 (six) hours as needed for moderate pain (pain score 4-6). 09/13/23  Yes Joaquin Mulberry, MD  potassium chloride  SA (KLOR-CON  M20) 20 MEQ tablet TAKE 1 TABLET BY MOUTH EVERY DAY 02/09/23  Yes Adelia Homestead, MD  spironolactone  (ALDACTONE ) 50 MG tablet TAKE 1 TABLET BY MOUTH EVERY DAY 02/09/23  Yes Adelia Homestead, MD  albuterol  (VENTOLIN  HFA) 108 (90 Base) MCG/ACT inhaler Inhale 2 puffs into the lungs every 6 (six) hours as needed for wheezing or shortness of breath. 07/07/23   Henson, Vickie L, NP-C  fluticasone  (FLONASE ) 50 MCG/ACT nasal spray PLACE 2 SPRAYS INTO BOTH NOSTRILS DAILY. SHAKE LIQUID AND USE 2 SPRAYS IN EACH NOSTRIL DAILY Patient taking differently: Place 2 sprays into both nostrils daily as needed for allergies. 01/15/20   Adelia Homestead, MD  guaiFENesin -codeine  100-10 MG/5ML syrup Take 5 mLs by mouth every 6 (six) hours as needed for cough (Use also prior to MRI to reduce cough). 12/16/22   Syliva Even, MD   Allergies  Allergen Reactions   Hydrocodone -Acetaminophen  Nausea And Vomiting   Other Hives and Rash    Fresh Fruit-facial rash/hives (bananas, grapes,etc)  Red meat-upset stomach   Tramadol  Itching    Social History   Tobacco Use   Smoking status: Never   Smokeless tobacco: Never  Substance Use Topics   Alcohol use: No    Family History  Problem Relation Age of Onset   Hypertension Mother    Diabetes Mother    Cancer Mother        in leg   Varicose Veins Maternal Uncle    Colon cancer Neg Hx    Colon polyps Neg Hx    Esophageal cancer Neg Hx    Stomach cancer Neg Hx    Rectal cancer Neg Hx      Review of Systems  Positive ROS: neg  All other systems have been reviewed and were otherwise negative with the exception of those mentioned in the HPI  and as above.  Objective: Vital signs in last 24 hours: Temp:  [98.1 F (36.7 C)] 98.1 F (36.7 C) (04/23 1046) Pulse Rate:  [73] 73 (04/23 1046) Resp:  [18] 18 (04/23 1046) BP: (126)/(65) 126/65 (04/23 1046) SpO2:  [98 %] 98 % (04/23 1046) Weight:  [76.7 kg] 76.7 kg (04/23 1046)  General Appearance: Alert, cooperative, no distress, appears stated age Head: Normocephalic, without obvious abnormality, atraumatic Eyes: PERRL, conjunctiva/corneas clear, EOM's intact    Neck: Supple, symmetrical, trachea midline Back: Symmetric, no curvature, ROM normal, no CVA tenderness Lungs:  respirations unlabored Heart: Regular rate and rhythm Abdomen: Soft, non-tender Extremities: Extremities normal, atraumatic, no cyanosis or edema Pulses: 2+ and symmetric all extremities Skin: Skin color, texture, turgor normal, no rashes or lesions  NEUROLOGIC:   Mental status: Alert and oriented x4,  no aphasia, good attention span, fund of knowledge, and memory Motor Exam - grossly normal Sensory Exam - grossly normal Reflexes: 1+ Coordination - grossly normal Gait - grossly normal Balance - grossly normal Cranial Nerves: I: smell Not tested  II: visual acuity  OS: nl    OD: nl  II: visual fields Full to confrontation  II: pupils Equal, round, reactive to light  III,VII: ptosis None  III,IV,VI: extraocular muscles  Full ROM  V: mastication Normal  V: facial light touch sensation  Normal  V,VII: corneal reflex  Present  VII: facial muscle function - upper  Normal  VII: facial muscle function - lower Normal  VIII: hearing Not tested  IX: soft palate elevation  Normal  IX,X: gag reflex Present  XI: trapezius strength  5/5  XI: sternocleidomastoid strength 5/5  XI: neck flexion strength  5/5  XII: tongue strength  Normal    Data Review Lab Results  Component Value Date   WBC 6.0 09/09/2023   HGB 11.9 (L) 09/09/2023   HCT 38.2 09/09/2023   MCV 96.0 09/09/2023   PLT 201 09/09/2023    Lab Results  Component Value Date   NA 139 09/09/2023   K 4.1 09/09/2023   CL 107 09/09/2023   CO2 23 09/09/2023   BUN 19 09/09/2023   CREATININE 1.24 (H) 09/09/2023   GLUCOSE 105 (H) 09/09/2023   Lab Results  Component Value Date   INR 1.0 09/09/2023    Assessment/Plan:  Estimated body mass index is 31.93 kg/m as calculated from the following:   Height as of this encounter: 5\' 1"  (1.549 m).   Weight as of this encounter: 76.7 kg. Patient admitted for wound revision and placement of an external negative pressure wound dressing. Patient has failed a reasonable attempt at conservative therapy.  I explained the condition and procedure to the patient and answered any questions.  Patient wishes to proceed with procedure as planned. Understands risks/ benefits and typical outcomes of procedure.   Isadora Mar 09/29/2023 1:42 PM

## 2023-09-29 NOTE — Op Note (Signed)
 09/29/2023  3:15 PM  PATIENT:  Catherine Hoffman  83 y.o. female  PRE-OPERATIVE DIAGNOSIS: Lumbar wound dehiscence  POST-OPERATIVE DIAGNOSIS:  same  PROCEDURE: Irrigation and debridement of lumbar wound with primary closure and placement of negative pressure wound dressing  SURGEON:  Waymond Hailey, MD  ASSISTANTS: None  ANESTHESIA:   General  EBL: 25 ml  No intake/output data recorded.  BLOOD ADMINISTERED: none  DRAINS: None  SPECIMEN:  none, operative cultures  INDICATION FOR PROCEDURE: This patient presented with a poorly healing lumbar wound with wound dehiscence 2 weeks after a 5 level lumbar decompressive laminectomy.  Recommended irrigation and debridement of lumbar wound with primary closure. Patient understood the risks, benefits, and alternatives and potential outcomes and wished to proceed.  PROCEDURE DETAILS: The patient was taken to the operating room and after induction of adequate generalized endotracheal anesthesia she was rolled into the prone position on chest rolls and all pressure points were padded.  Her lumbar region was cleaned with Betadine  scrub and prepped with DuraPrep and then draped in the usual sterile fashion.  A timeout was performed.  Her incision was then ellipsed out and carried down to the fascia.  It did not require as I go through the fascia.  I saw no signs of infection.  We did do intraoperative cultures.  I debrided the tissues with both blunt and sharp dissection.  The length of the incision was 10 cm and the depth of the incision was 4 cm.  Irrigated with saline solution followed by Vashe solution.  I dried all bleeding points.  I placed 1500 g of.  Fine morsels into the wound and then closed the wound in layers of 2-0 Vicryl in the subcutaneous tissue and 3-0 Vicryl in the subcuticular tissue.  A negative pressure external wound VAC was then placed over the wound and hooked to suction.  The patient was then awakened from general anesthesia and  transferred to the cover room in stable condition.  At the end of the procedure all sponge needle and instrument counts were correct.   PLAN OF CARE: Discharge to home after PACU  PATIENT DISPOSITION:  PACU - hemodynamically stable.   Delay start of Pharmacological VTE agent (>24hrs) due to surgical blood loss or risk of bleeding:  yes

## 2023-09-30 ENCOUNTER — Encounter (HOSPITAL_COMMUNITY): Payer: Self-pay | Admitting: Neurological Surgery

## 2023-09-30 DIAGNOSIS — M51361 Other intervertebral disc degeneration, lumbar region with lower extremity pain only: Secondary | ICD-10-CM | POA: Diagnosis not present

## 2023-09-30 DIAGNOSIS — E785 Hyperlipidemia, unspecified: Secondary | ICD-10-CM | POA: Diagnosis not present

## 2023-09-30 DIAGNOSIS — H259 Unspecified age-related cataract: Secondary | ICD-10-CM | POA: Diagnosis not present

## 2023-09-30 DIAGNOSIS — J4520 Mild intermittent asthma, uncomplicated: Secondary | ICD-10-CM | POA: Diagnosis not present

## 2023-09-30 DIAGNOSIS — I872 Venous insufficiency (chronic) (peripheral): Secondary | ICD-10-CM | POA: Diagnosis not present

## 2023-09-30 DIAGNOSIS — Z4789 Encounter for other orthopedic aftercare: Secondary | ICD-10-CM | POA: Diagnosis not present

## 2023-09-30 DIAGNOSIS — N3281 Overactive bladder: Secondary | ICD-10-CM | POA: Diagnosis not present

## 2023-09-30 DIAGNOSIS — M48061 Spinal stenosis, lumbar region without neurogenic claudication: Secondary | ICD-10-CM | POA: Diagnosis not present

## 2023-09-30 DIAGNOSIS — I10 Essential (primary) hypertension: Secondary | ICD-10-CM | POA: Diagnosis not present

## 2023-10-02 ENCOUNTER — Encounter: Payer: Self-pay | Admitting: Surgery

## 2023-10-02 ENCOUNTER — Telehealth: Payer: Self-pay

## 2023-10-02 NOTE — Telephone Encounter (Signed)
 Received a call from [facility], that [name] from [facility], had called and said  the patients proveena is not working. From previous issues with other patients, if the patient needs another proveena placed, they will have to come to the hospital and check in so they can have a  account number to charge the provena to. Clance Crigler will call her attending to see if this is what is needed or if they can do something different.

## 2023-10-04 ENCOUNTER — Encounter: Payer: Self-pay | Admitting: Surgery

## 2023-10-04 DIAGNOSIS — M48061 Spinal stenosis, lumbar region without neurogenic claudication: Secondary | ICD-10-CM | POA: Diagnosis not present

## 2023-10-04 DIAGNOSIS — M51361 Other intervertebral disc degeneration, lumbar region with lower extremity pain only: Secondary | ICD-10-CM | POA: Diagnosis not present

## 2023-10-04 DIAGNOSIS — H259 Unspecified age-related cataract: Secondary | ICD-10-CM | POA: Diagnosis not present

## 2023-10-04 DIAGNOSIS — E785 Hyperlipidemia, unspecified: Secondary | ICD-10-CM | POA: Diagnosis not present

## 2023-10-04 DIAGNOSIS — N3281 Overactive bladder: Secondary | ICD-10-CM | POA: Diagnosis not present

## 2023-10-04 DIAGNOSIS — I10 Essential (primary) hypertension: Secondary | ICD-10-CM | POA: Diagnosis not present

## 2023-10-04 DIAGNOSIS — J4520 Mild intermittent asthma, uncomplicated: Secondary | ICD-10-CM | POA: Diagnosis not present

## 2023-10-04 DIAGNOSIS — I872 Venous insufficiency (chronic) (peripheral): Secondary | ICD-10-CM | POA: Diagnosis not present

## 2023-10-04 DIAGNOSIS — Z4789 Encounter for other orthopedic aftercare: Secondary | ICD-10-CM | POA: Diagnosis not present

## 2023-10-04 LAB — AEROBIC/ANAEROBIC CULTURE W GRAM STAIN (SURGICAL/DEEP WOUND)

## 2023-10-05 DIAGNOSIS — E785 Hyperlipidemia, unspecified: Secondary | ICD-10-CM | POA: Diagnosis not present

## 2023-10-05 DIAGNOSIS — I10 Essential (primary) hypertension: Secondary | ICD-10-CM | POA: Diagnosis not present

## 2023-10-05 DIAGNOSIS — Z4789 Encounter for other orthopedic aftercare: Secondary | ICD-10-CM | POA: Diagnosis not present

## 2023-10-05 DIAGNOSIS — M48061 Spinal stenosis, lumbar region without neurogenic claudication: Secondary | ICD-10-CM | POA: Diagnosis not present

## 2023-10-05 DIAGNOSIS — N3281 Overactive bladder: Secondary | ICD-10-CM | POA: Diagnosis not present

## 2023-10-05 DIAGNOSIS — M51361 Other intervertebral disc degeneration, lumbar region with lower extremity pain only: Secondary | ICD-10-CM | POA: Diagnosis not present

## 2023-10-05 DIAGNOSIS — J4520 Mild intermittent asthma, uncomplicated: Secondary | ICD-10-CM | POA: Diagnosis not present

## 2023-10-05 DIAGNOSIS — I872 Venous insufficiency (chronic) (peripheral): Secondary | ICD-10-CM | POA: Diagnosis not present

## 2023-10-05 DIAGNOSIS — H259 Unspecified age-related cataract: Secondary | ICD-10-CM | POA: Diagnosis not present

## 2023-10-06 DIAGNOSIS — N3281 Overactive bladder: Secondary | ICD-10-CM | POA: Diagnosis not present

## 2023-10-06 DIAGNOSIS — M48061 Spinal stenosis, lumbar region without neurogenic claudication: Secondary | ICD-10-CM | POA: Diagnosis not present

## 2023-10-06 DIAGNOSIS — J4520 Mild intermittent asthma, uncomplicated: Secondary | ICD-10-CM | POA: Diagnosis not present

## 2023-10-06 DIAGNOSIS — I872 Venous insufficiency (chronic) (peripheral): Secondary | ICD-10-CM | POA: Diagnosis not present

## 2023-10-06 DIAGNOSIS — I10 Essential (primary) hypertension: Secondary | ICD-10-CM | POA: Diagnosis not present

## 2023-10-06 DIAGNOSIS — H259 Unspecified age-related cataract: Secondary | ICD-10-CM | POA: Diagnosis not present

## 2023-10-06 DIAGNOSIS — M51361 Other intervertebral disc degeneration, lumbar region with lower extremity pain only: Secondary | ICD-10-CM | POA: Diagnosis not present

## 2023-10-06 DIAGNOSIS — E785 Hyperlipidemia, unspecified: Secondary | ICD-10-CM | POA: Diagnosis not present

## 2023-10-06 DIAGNOSIS — Z4789 Encounter for other orthopedic aftercare: Secondary | ICD-10-CM | POA: Diagnosis not present

## 2023-10-08 DIAGNOSIS — I872 Venous insufficiency (chronic) (peripheral): Secondary | ICD-10-CM | POA: Diagnosis not present

## 2023-10-08 DIAGNOSIS — N3281 Overactive bladder: Secondary | ICD-10-CM | POA: Diagnosis not present

## 2023-10-08 DIAGNOSIS — H259 Unspecified age-related cataract: Secondary | ICD-10-CM | POA: Diagnosis not present

## 2023-10-08 DIAGNOSIS — M48061 Spinal stenosis, lumbar region without neurogenic claudication: Secondary | ICD-10-CM | POA: Diagnosis not present

## 2023-10-08 DIAGNOSIS — Z4789 Encounter for other orthopedic aftercare: Secondary | ICD-10-CM | POA: Diagnosis not present

## 2023-10-08 DIAGNOSIS — J4520 Mild intermittent asthma, uncomplicated: Secondary | ICD-10-CM | POA: Diagnosis not present

## 2023-10-08 DIAGNOSIS — M51361 Other intervertebral disc degeneration, lumbar region with lower extremity pain only: Secondary | ICD-10-CM | POA: Diagnosis not present

## 2023-10-08 DIAGNOSIS — E785 Hyperlipidemia, unspecified: Secondary | ICD-10-CM | POA: Diagnosis not present

## 2023-10-08 DIAGNOSIS — I10 Essential (primary) hypertension: Secondary | ICD-10-CM | POA: Diagnosis not present

## 2023-10-11 DIAGNOSIS — Z4789 Encounter for other orthopedic aftercare: Secondary | ICD-10-CM | POA: Diagnosis not present

## 2023-10-11 DIAGNOSIS — I872 Venous insufficiency (chronic) (peripheral): Secondary | ICD-10-CM | POA: Diagnosis not present

## 2023-10-11 DIAGNOSIS — H259 Unspecified age-related cataract: Secondary | ICD-10-CM | POA: Diagnosis not present

## 2023-10-11 DIAGNOSIS — M51361 Other intervertebral disc degeneration, lumbar region with lower extremity pain only: Secondary | ICD-10-CM | POA: Diagnosis not present

## 2023-10-11 DIAGNOSIS — M48061 Spinal stenosis, lumbar region without neurogenic claudication: Secondary | ICD-10-CM | POA: Diagnosis not present

## 2023-10-11 DIAGNOSIS — N3281 Overactive bladder: Secondary | ICD-10-CM | POA: Diagnosis not present

## 2023-10-11 DIAGNOSIS — J4520 Mild intermittent asthma, uncomplicated: Secondary | ICD-10-CM | POA: Diagnosis not present

## 2023-10-11 DIAGNOSIS — E785 Hyperlipidemia, unspecified: Secondary | ICD-10-CM | POA: Diagnosis not present

## 2023-10-11 DIAGNOSIS — I10 Essential (primary) hypertension: Secondary | ICD-10-CM | POA: Diagnosis not present

## 2023-10-14 DIAGNOSIS — E785 Hyperlipidemia, unspecified: Secondary | ICD-10-CM | POA: Diagnosis not present

## 2023-10-14 DIAGNOSIS — I10 Essential (primary) hypertension: Secondary | ICD-10-CM | POA: Diagnosis not present

## 2023-10-14 DIAGNOSIS — I872 Venous insufficiency (chronic) (peripheral): Secondary | ICD-10-CM | POA: Diagnosis not present

## 2023-10-14 DIAGNOSIS — M51361 Other intervertebral disc degeneration, lumbar region with lower extremity pain only: Secondary | ICD-10-CM | POA: Diagnosis not present

## 2023-10-14 DIAGNOSIS — Z4789 Encounter for other orthopedic aftercare: Secondary | ICD-10-CM | POA: Diagnosis not present

## 2023-10-14 DIAGNOSIS — N3281 Overactive bladder: Secondary | ICD-10-CM | POA: Diagnosis not present

## 2023-10-14 DIAGNOSIS — H259 Unspecified age-related cataract: Secondary | ICD-10-CM | POA: Diagnosis not present

## 2023-10-14 DIAGNOSIS — M48061 Spinal stenosis, lumbar region without neurogenic claudication: Secondary | ICD-10-CM | POA: Diagnosis not present

## 2023-10-14 DIAGNOSIS — J4520 Mild intermittent asthma, uncomplicated: Secondary | ICD-10-CM | POA: Diagnosis not present

## 2023-10-15 DIAGNOSIS — Z4789 Encounter for other orthopedic aftercare: Secondary | ICD-10-CM | POA: Diagnosis not present

## 2023-10-15 DIAGNOSIS — M51361 Other intervertebral disc degeneration, lumbar region with lower extremity pain only: Secondary | ICD-10-CM | POA: Diagnosis not present

## 2023-10-15 DIAGNOSIS — N3281 Overactive bladder: Secondary | ICD-10-CM | POA: Diagnosis not present

## 2023-10-15 DIAGNOSIS — M48061 Spinal stenosis, lumbar region without neurogenic claudication: Secondary | ICD-10-CM | POA: Diagnosis not present

## 2023-10-15 DIAGNOSIS — I872 Venous insufficiency (chronic) (peripheral): Secondary | ICD-10-CM | POA: Diagnosis not present

## 2023-10-15 DIAGNOSIS — E785 Hyperlipidemia, unspecified: Secondary | ICD-10-CM | POA: Diagnosis not present

## 2023-10-15 DIAGNOSIS — H259 Unspecified age-related cataract: Secondary | ICD-10-CM | POA: Diagnosis not present

## 2023-10-15 DIAGNOSIS — J4520 Mild intermittent asthma, uncomplicated: Secondary | ICD-10-CM | POA: Diagnosis not present

## 2023-10-15 DIAGNOSIS — I10 Essential (primary) hypertension: Secondary | ICD-10-CM | POA: Diagnosis not present

## 2023-10-18 DIAGNOSIS — H259 Unspecified age-related cataract: Secondary | ICD-10-CM | POA: Diagnosis not present

## 2023-10-18 DIAGNOSIS — N3281 Overactive bladder: Secondary | ICD-10-CM | POA: Diagnosis not present

## 2023-10-18 DIAGNOSIS — M51361 Other intervertebral disc degeneration, lumbar region with lower extremity pain only: Secondary | ICD-10-CM | POA: Diagnosis not present

## 2023-10-18 DIAGNOSIS — Z4789 Encounter for other orthopedic aftercare: Secondary | ICD-10-CM | POA: Diagnosis not present

## 2023-10-18 DIAGNOSIS — I872 Venous insufficiency (chronic) (peripheral): Secondary | ICD-10-CM | POA: Diagnosis not present

## 2023-10-18 DIAGNOSIS — J4520 Mild intermittent asthma, uncomplicated: Secondary | ICD-10-CM | POA: Diagnosis not present

## 2023-10-18 DIAGNOSIS — E785 Hyperlipidemia, unspecified: Secondary | ICD-10-CM | POA: Diagnosis not present

## 2023-10-18 DIAGNOSIS — I10 Essential (primary) hypertension: Secondary | ICD-10-CM | POA: Diagnosis not present

## 2023-10-18 DIAGNOSIS — M48061 Spinal stenosis, lumbar region without neurogenic claudication: Secondary | ICD-10-CM | POA: Diagnosis not present

## 2023-10-20 DIAGNOSIS — H259 Unspecified age-related cataract: Secondary | ICD-10-CM | POA: Diagnosis not present

## 2023-10-20 DIAGNOSIS — E785 Hyperlipidemia, unspecified: Secondary | ICD-10-CM | POA: Diagnosis not present

## 2023-10-20 DIAGNOSIS — M51361 Other intervertebral disc degeneration, lumbar region with lower extremity pain only: Secondary | ICD-10-CM | POA: Diagnosis not present

## 2023-10-20 DIAGNOSIS — I872 Venous insufficiency (chronic) (peripheral): Secondary | ICD-10-CM | POA: Diagnosis not present

## 2023-10-20 DIAGNOSIS — Z4789 Encounter for other orthopedic aftercare: Secondary | ICD-10-CM | POA: Diagnosis not present

## 2023-10-20 DIAGNOSIS — J4520 Mild intermittent asthma, uncomplicated: Secondary | ICD-10-CM | POA: Diagnosis not present

## 2023-10-20 DIAGNOSIS — I10 Essential (primary) hypertension: Secondary | ICD-10-CM | POA: Diagnosis not present

## 2023-10-20 DIAGNOSIS — M48061 Spinal stenosis, lumbar region without neurogenic claudication: Secondary | ICD-10-CM | POA: Diagnosis not present

## 2023-10-20 DIAGNOSIS — N3281 Overactive bladder: Secondary | ICD-10-CM | POA: Diagnosis not present

## 2023-10-22 DIAGNOSIS — I872 Venous insufficiency (chronic) (peripheral): Secondary | ICD-10-CM | POA: Diagnosis not present

## 2023-10-22 DIAGNOSIS — M48061 Spinal stenosis, lumbar region without neurogenic claudication: Secondary | ICD-10-CM | POA: Diagnosis not present

## 2023-10-22 DIAGNOSIS — Z4789 Encounter for other orthopedic aftercare: Secondary | ICD-10-CM | POA: Diagnosis not present

## 2023-10-22 DIAGNOSIS — E785 Hyperlipidemia, unspecified: Secondary | ICD-10-CM | POA: Diagnosis not present

## 2023-10-22 DIAGNOSIS — I10 Essential (primary) hypertension: Secondary | ICD-10-CM | POA: Diagnosis not present

## 2023-10-22 DIAGNOSIS — H259 Unspecified age-related cataract: Secondary | ICD-10-CM | POA: Diagnosis not present

## 2023-10-22 DIAGNOSIS — M51361 Other intervertebral disc degeneration, lumbar region with lower extremity pain only: Secondary | ICD-10-CM | POA: Diagnosis not present

## 2023-10-22 DIAGNOSIS — J4520 Mild intermittent asthma, uncomplicated: Secondary | ICD-10-CM | POA: Diagnosis not present

## 2023-10-22 DIAGNOSIS — N3281 Overactive bladder: Secondary | ICD-10-CM | POA: Diagnosis not present

## 2023-10-25 ENCOUNTER — Other Ambulatory Visit: Payer: Self-pay | Admitting: Internal Medicine

## 2023-10-25 DIAGNOSIS — M51361 Other intervertebral disc degeneration, lumbar region with lower extremity pain only: Secondary | ICD-10-CM | POA: Diagnosis not present

## 2023-10-25 DIAGNOSIS — I10 Essential (primary) hypertension: Secondary | ICD-10-CM | POA: Diagnosis not present

## 2023-10-25 DIAGNOSIS — Z4789 Encounter for other orthopedic aftercare: Secondary | ICD-10-CM | POA: Diagnosis not present

## 2023-10-25 DIAGNOSIS — N3281 Overactive bladder: Secondary | ICD-10-CM | POA: Diagnosis not present

## 2023-10-25 DIAGNOSIS — M48061 Spinal stenosis, lumbar region without neurogenic claudication: Secondary | ICD-10-CM | POA: Diagnosis not present

## 2023-10-25 DIAGNOSIS — E785 Hyperlipidemia, unspecified: Secondary | ICD-10-CM | POA: Diagnosis not present

## 2023-10-25 DIAGNOSIS — J4520 Mild intermittent asthma, uncomplicated: Secondary | ICD-10-CM | POA: Diagnosis not present

## 2023-10-25 DIAGNOSIS — I872 Venous insufficiency (chronic) (peripheral): Secondary | ICD-10-CM | POA: Diagnosis not present

## 2023-10-25 DIAGNOSIS — H259 Unspecified age-related cataract: Secondary | ICD-10-CM | POA: Diagnosis not present

## 2023-10-26 DIAGNOSIS — M51361 Other intervertebral disc degeneration, lumbar region with lower extremity pain only: Secondary | ICD-10-CM | POA: Diagnosis not present

## 2023-10-26 DIAGNOSIS — N3281 Overactive bladder: Secondary | ICD-10-CM | POA: Diagnosis not present

## 2023-10-26 DIAGNOSIS — I872 Venous insufficiency (chronic) (peripheral): Secondary | ICD-10-CM | POA: Diagnosis not present

## 2023-10-26 DIAGNOSIS — E785 Hyperlipidemia, unspecified: Secondary | ICD-10-CM | POA: Diagnosis not present

## 2023-10-26 DIAGNOSIS — H259 Unspecified age-related cataract: Secondary | ICD-10-CM | POA: Diagnosis not present

## 2023-10-26 DIAGNOSIS — J4520 Mild intermittent asthma, uncomplicated: Secondary | ICD-10-CM | POA: Diagnosis not present

## 2023-10-26 DIAGNOSIS — Z4789 Encounter for other orthopedic aftercare: Secondary | ICD-10-CM | POA: Diagnosis not present

## 2023-10-26 DIAGNOSIS — M48061 Spinal stenosis, lumbar region without neurogenic claudication: Secondary | ICD-10-CM | POA: Diagnosis not present

## 2023-10-26 DIAGNOSIS — I10 Essential (primary) hypertension: Secondary | ICD-10-CM | POA: Diagnosis not present

## 2023-10-27 ENCOUNTER — Ambulatory Visit: Admitting: Internal Medicine

## 2023-10-27 ENCOUNTER — Encounter: Payer: Self-pay | Admitting: Internal Medicine

## 2023-10-27 ENCOUNTER — Ambulatory Visit: Payer: Self-pay

## 2023-10-27 VITALS — BP 90/58 | HR 70 | Temp 97.8°F | Ht 61.0 in

## 2023-10-27 DIAGNOSIS — E785 Hyperlipidemia, unspecified: Secondary | ICD-10-CM | POA: Diagnosis not present

## 2023-10-27 DIAGNOSIS — M1712 Unilateral primary osteoarthritis, left knee: Secondary | ICD-10-CM | POA: Diagnosis not present

## 2023-10-27 DIAGNOSIS — T148XXA Other injury of unspecified body region, initial encounter: Secondary | ICD-10-CM

## 2023-10-27 DIAGNOSIS — I959 Hypotension, unspecified: Secondary | ICD-10-CM | POA: Insufficient documentation

## 2023-10-27 DIAGNOSIS — I10 Essential (primary) hypertension: Secondary | ICD-10-CM | POA: Diagnosis not present

## 2023-10-27 DIAGNOSIS — I872 Venous insufficiency (chronic) (peripheral): Secondary | ICD-10-CM | POA: Diagnosis not present

## 2023-10-27 DIAGNOSIS — M48061 Spinal stenosis, lumbar region without neurogenic claudication: Secondary | ICD-10-CM | POA: Diagnosis not present

## 2023-10-27 DIAGNOSIS — R112 Nausea with vomiting, unspecified: Secondary | ICD-10-CM | POA: Insufficient documentation

## 2023-10-27 DIAGNOSIS — E861 Hypovolemia: Secondary | ICD-10-CM

## 2023-10-27 DIAGNOSIS — L089 Local infection of the skin and subcutaneous tissue, unspecified: Secondary | ICD-10-CM | POA: Insufficient documentation

## 2023-10-27 DIAGNOSIS — Z4789 Encounter for other orthopedic aftercare: Secondary | ICD-10-CM | POA: Diagnosis not present

## 2023-10-27 DIAGNOSIS — R634 Abnormal weight loss: Secondary | ICD-10-CM | POA: Diagnosis not present

## 2023-10-27 DIAGNOSIS — N3281 Overactive bladder: Secondary | ICD-10-CM | POA: Diagnosis not present

## 2023-10-27 DIAGNOSIS — M51361 Other intervertebral disc degeneration, lumbar region with lower extremity pain only: Secondary | ICD-10-CM | POA: Diagnosis not present

## 2023-10-27 DIAGNOSIS — J4520 Mild intermittent asthma, uncomplicated: Secondary | ICD-10-CM | POA: Diagnosis not present

## 2023-10-27 DIAGNOSIS — H259 Unspecified age-related cataract: Secondary | ICD-10-CM | POA: Diagnosis not present

## 2023-10-27 DIAGNOSIS — T8131XA Disruption of external operation (surgical) wound, not elsewhere classified, initial encounter: Secondary | ICD-10-CM | POA: Insufficient documentation

## 2023-10-27 LAB — CBC WITH DIFFERENTIAL/PLATELET
Basophils Absolute: 0 10*3/uL (ref 0.0–0.1)
Basophils Relative: 1.3 % (ref 0.0–3.0)
Eosinophils Absolute: 0.3 10*3/uL (ref 0.0–0.7)
Eosinophils Relative: 8.4 % — ABNORMAL HIGH (ref 0.0–5.0)
HCT: 35 % — ABNORMAL LOW (ref 36.0–46.0)
Hemoglobin: 11.1 g/dL — ABNORMAL LOW (ref 12.0–15.0)
Lymphocytes Relative: 41 % (ref 12.0–46.0)
Lymphs Abs: 1.2 10*3/uL (ref 0.7–4.0)
MCHC: 31.7 g/dL (ref 30.0–36.0)
MCV: 91.8 fl (ref 78.0–100.0)
Monocytes Absolute: 0.4 10*3/uL (ref 0.1–1.0)
Monocytes Relative: 12.4 % — ABNORMAL HIGH (ref 3.0–12.0)
Neutro Abs: 1.1 10*3/uL — ABNORMAL LOW (ref 1.4–7.7)
Neutrophils Relative %: 36.9 % — ABNORMAL LOW (ref 43.0–77.0)
Platelets: 240 10*3/uL (ref 150.0–400.0)
RBC: 3.81 Mil/uL — ABNORMAL LOW (ref 3.87–5.11)
RDW: 15.6 % — ABNORMAL HIGH (ref 11.5–15.5)
WBC: 3 10*3/uL — ABNORMAL LOW (ref 4.0–10.5)

## 2023-10-27 LAB — URINALYSIS
Bilirubin Urine: NEGATIVE
Hgb urine dipstick: NEGATIVE
Leukocytes,Ua: NEGATIVE
Nitrite: NEGATIVE
Specific Gravity, Urine: 1.02 (ref 1.000–1.030)
Total Protein, Urine: NEGATIVE
Urine Glucose: NEGATIVE
Urobilinogen, UA: 0.2 (ref 0.0–1.0)
pH: 6 (ref 5.0–8.0)

## 2023-10-27 LAB — COMPREHENSIVE METABOLIC PANEL WITH GFR
ALT: 94 U/L — ABNORMAL HIGH (ref 0–35)
AST: 88 U/L — ABNORMAL HIGH (ref 0–37)
Albumin: 4.6 g/dL (ref 3.5–5.2)
Alkaline Phosphatase: 92 U/L (ref 39–117)
BUN: 31 mg/dL — ABNORMAL HIGH (ref 6–23)
CO2: 18 meq/L — ABNORMAL LOW (ref 19–32)
Calcium: 10 mg/dL (ref 8.4–10.5)
Chloride: 102 meq/L (ref 96–112)
Creatinine, Ser: 2.03 mg/dL — ABNORMAL HIGH (ref 0.40–1.20)
GFR: 22.4 mL/min — ABNORMAL LOW (ref >60.00)
Glucose, Bld: 93 mg/dL (ref 70–99)
Potassium: 6 meq/L — ABNORMAL HIGH (ref 3.5–5.1)
Sodium: 129 meq/L — ABNORMAL LOW (ref 135–145)
Total Bilirubin: 0.4 mg/dL (ref 0.2–1.2)
Total Protein: 8 g/dL (ref 6.0–8.3)

## 2023-10-27 LAB — LIPASE: Lipase: 50 U/L (ref 11.0–59.0)

## 2023-10-27 LAB — TSH: TSH: 2.85 u[IU]/mL (ref 0.35–5.50)

## 2023-10-27 MED ORDER — ONDANSETRON HCL 4 MG PO TABS: 4.0000 mg | ORAL_TABLET | Freq: Three times a day (TID) | ORAL | 0 refills | Status: AC | PRN

## 2023-10-27 NOTE — Assessment & Plan Note (Addendum)
 On Bactrim for wound infection x 3 wks - will stop Labs - CMET, CBC, ESR Abd US  if not better Hold ditropan 

## 2023-10-27 NOTE — Progress Notes (Unsigned)
 Subjective:  Patient ID: Catherine Hoffman, female    DOB: [birth year]  Age: 83 y.o. MRN: [id number]  CC: Medical Management of Chronic Issues (Pt wants to discuss low BP and fatigue (symptoms for the last 2 weeks))   HPI Catherine Hoffman presents for nausea, vomiting, wt loss x 2-3 weeks H/o back surgery in 09/2023 On Bactrim for wound infection x 3 wks   Outpatient Medications Prior to Visit  Medication Sig Dispense Refill  . acetaminophen  (TYLENOL ) 500 MG tablet Take 1,000 mg by mouth every 6 (six) hours as needed (pain.).    . albuterol  (VENTOLIN  HFA) 108 (90 Base) MCG/ACT inhaler Inhale 2 puffs into the lungs every 6 (six) hours as needed for wheezing or shortness of breath. 8 g 0  . aspirin  ([employer]S ASPIRIN  ADULT LOW DOSE) 81 MG chewable tablet Chew 1 tablet (81 mg total) by mouth daily. 108 tablet 1  . cetirizine  (ZYRTEC ) 10 MG tablet TAKE 1 TABLET BY MOUTH EVERY DAY 90 tablet 3  . Cholecalciferol (VITAMIN D3) 50 MCG (2000 UT) TABS Take 2,000 Units by mouth in the morning.    . estradiol  (ESTRACE ) 0.5 MG tablet Take 0.5 mg by mouth every other day.    . fluticasone  (FLONASE ) 50 MCG/ACT nasal spray PLACE 2 SPRAYS INTO BOTH NOSTRILS DAILY. SHAKE LIQUID AND USE 2 SPRAYS IN EACH NOSTRIL DAILY (Patient taking differently: Place 2 sprays into both nostrils daily as needed for allergies.) 48 mL 1  . gabapentin  (NEURONTIN ) 100 MG capsule Take 1-3 capsules (100-300 mg total) by mouth at bedtime as needed (nerve pain). (Patient taking differently: Take 100 mg by mouth at bedtime as needed (nerve pain).) 90 capsule 3  . guaiFENesin -codeine  100-10 MG/5ML syrup Take 5 mLs by mouth every 6 (six) hours as needed for cough (Use also prior to MRI to reduce cough). 120 mL 0  . latanoprost  (XALATAN ) 0.005 % ophthalmic solution Place 1 drop into both eyes daily as needed (eye weakness).    . MAGNESIUM  PO Take 1 capsule by mouth daily as needed (cramps).    . meloxicam  (MOBIC ) 15 MG tablet TAKE 1 TABLET BY MOUTH EVERY  DAY 30 tablet 1  . montelukast  (SINGULAIR ) 10 MG tablet TAKE 1 TABLET BY MOUTH EVERYDAY AT BEDTIME 90 tablet 1  . Omega-3 Fatty Acids (FISH OIL) 1000 MG CAPS Take 1,000 mg by mouth in the morning.    . omeprazole  (PRILOSEC) 40 MG capsule TAKE 1 CAPSULE BY MOUTH TWICE DAILY 30 MINUTES BEFORE FIRST AND LAST MEAL OF THE DAY 180 capsule 0  . oxybutynin  (DITROPAN -XL) 5 MG 24 hr tablet TAKE 1 TABLET BY MOUTH EVERYDAY AT BEDTIME 90 tablet 1  . oxyCODONE  (OXY IR/ROXICODONE ) 5 MG immediate release tablet Take 1 tablet (5 mg total) by mouth every 6 (six) hours as needed for moderate pain (pain score 4-6). 30 tablet 0  . potassium chloride  SA (KLOR-CON  M) 20 MEQ tablet TAKE 1 TABLET BY MOUTH EVERY DAY 90 tablet 0  . spironolactone  (ALDACTONE ) 50 MG tablet TAKE 1 TABLET BY MOUTH EVERY DAY 90 tablet 3   No facility-administered medications prior to visit.    ROS: Review of Systems  Objective:  BP (!) 90/58   Pulse 70   Temp 97.8 F (36.6 C)   Ht 5\' 1"  (1.549 m)   SpO2 99%   BMI 31.93 kg/m   BP Readings from Last 3 Encounters:  10/27/23 (!) 90/58  09/29/23 (!) 117/55  09/13/23 (!) 103/54  Wt Readings from Last 3 Encounters:  09/29/23 169 lb (76.7 kg)  09/11/23 189 lb 6 oz (85.9 kg)  09/09/23 179 lb 12.8 oz (81.6 kg)    Physical Exam  Lab Results  Component Value Date   WBC 6.0 09/09/2023   HGB 11.9 (L) 09/09/2023   HCT 38.2 09/09/2023   PLT 201 09/09/2023   GLUCOSE 105 (H) 09/09/2023   CHOL 212 (H) 08/01/2020   TRIG 56.0 08/01/2020   HDL 53.50 08/01/2020   LDLDIRECT 150.2 05/30/2012   LDLCALC 148 (H) 08/01/2020   ALT 8 03/10/2022   AST 13 03/10/2022   NA 139 09/09/2023   K 4.1 09/09/2023   CL 107 09/09/2023   CREATININE 1.24 (H) 09/09/2023   BUN 19 09/09/2023   CO2 23 09/09/2023   TSH 1.40 08/17/2014   INR 1.0 09/09/2023   HGBA1C 5.8 08/01/2020    No results found.  Assessment & Plan:   Problem List Items Addressed This Visit     Nausea & vomiting - Primary    On Bactrim for wound infection x 3 wks - will stop Labs - CMET, CBC, ESR Abd US  if not better Hold ditropan       Relevant Orders   CBC with Differential/Platelet   Comprehensive metabolic panel with GFR   Lipase   Urinalysis   TSH   Weight loss   On Bactrim for wound infection x 3 wks - ?nausea -will stop Labs - CMET, CBC, ESR, lipase Abd US  if not better      Relevant Orders   CBC with Differential/Platelet   Comprehensive metabolic panel with GFR   Lipase   Urinalysis   TSH   Wound infection   E coli, resistant On Bactrim for wound infection x 3 wks - ?nausea -will stop Bactrim today Appt w/Dr Rochelle Chu tomorrow       Relevant Orders   CBC with Differential/Platelet   Comprehensive metabolic panel with GFR   Lipase   Urinalysis   TSH   Low BP   Pt lost wt, nauseated Hold spironolactone       Relevant Orders   CBC with Differential/Platelet   Comprehensive metabolic panel with GFR   Lipase   Urinalysis   TSH      Meds ordered this encounter  Medications  . ondansetron  (ZOFRAN ) 4 MG tablet    Sig: Take 1 tablet (4 mg total) by mouth every 8 (eight) hours as needed for nausea or vomiting.    Dispense:  20 tablet    Refill:  0      Follow-up: Return in about 2 weeks (around 11/10/2023) for f/u with PCP.  Anitra Barn, MD

## 2023-10-27 NOTE — Telephone Encounter (Signed)
 Chief Complaint: low BP Symptoms: low BP, not eating or drinking  Frequency: about a week Pertinent Negatives: Patient denies chest pain Disposition: [] ED /[] Urgent Care (no appt availability in office) / [x] Appointment(In office/virtual)/ []  [facility]/ [] Home Care/ [] Refused Recommended Disposition /[] [place] [facility]/ []  Follow-up with PCP Additional Notes: Christ PT, states pt had spinal surgery and has not has a f/u appt with her PCP. States pt was doing well for about 3 week but lately has been complaining of fatigue, not eating. States standing BP 85/54 and pt is light headed. States sitting BP 105/60. States this has been going on for a week. States pt is not eating. States pt also has rash on her right arm.  PT states that pt does seem a little more confused to him.  Had him retake BP and reading 109/78 HR 64.  Pt fell after spinal sx and was admitted to skilled nursing  and had multiple falls there but d/c in April back home.    Copied from CRM [id number]. Topic: Clinical - Red Word Triage >> Oct 27, 2023  9:29 AM Dorthula Gavel H wrote: Red Word that prompted transfer to Nurse Triage: Physical Therapist is currently with the pt who is having numerous health issues since spine surgery 6 week ago (not eating or drinking, dizziness, low energy, low blood pressure). Pt has not seen PCP since surgery. Reason for Disposition  [1] Systolic BP 90-110 AND [2] taking blood pressure medications AND [3] dizzy, lightheaded or weak  Protocols used: Blood Pressure - Low-A-AH

## 2023-10-27 NOTE — Assessment & Plan Note (Signed)
 Pt lost wt, nauseated Hold spironolactone 

## 2023-10-27 NOTE — Patient Instructions (Addendum)
 Hold ditropan  Hold spironolactone  Stop Bactrim

## 2023-10-27 NOTE — Assessment & Plan Note (Signed)
 On Bactrim for wound infection x 3 wks - ?nausea -will stop Labs - CMET, CBC, ESR, lipase Abd US  if not better

## 2023-10-27 NOTE — Assessment & Plan Note (Signed)
 E coli, resistant On Bactrim for wound infection x 3 wks - ?nausea -will stop Bactrim today Appt w/Dr Rochelle Chu tomorrow

## 2023-10-29 ENCOUNTER — Ambulatory Visit: Payer: Self-pay | Admitting: Internal Medicine

## 2023-10-29 DIAGNOSIS — R7989 Other specified abnormal findings of blood chemistry: Secondary | ICD-10-CM

## 2023-10-29 DIAGNOSIS — H259 Unspecified age-related cataract: Secondary | ICD-10-CM | POA: Diagnosis not present

## 2023-10-29 DIAGNOSIS — I10 Essential (primary) hypertension: Secondary | ICD-10-CM | POA: Diagnosis not present

## 2023-10-29 DIAGNOSIS — E785 Hyperlipidemia, unspecified: Secondary | ICD-10-CM | POA: Diagnosis not present

## 2023-10-29 DIAGNOSIS — J4520 Mild intermittent asthma, uncomplicated: Secondary | ICD-10-CM | POA: Diagnosis not present

## 2023-10-29 DIAGNOSIS — M51361 Other intervertebral disc degeneration, lumbar region with lower extremity pain only: Secondary | ICD-10-CM | POA: Diagnosis not present

## 2023-10-29 DIAGNOSIS — Z4789 Encounter for other orthopedic aftercare: Secondary | ICD-10-CM | POA: Diagnosis not present

## 2023-10-29 DIAGNOSIS — N3281 Overactive bladder: Secondary | ICD-10-CM | POA: Diagnosis not present

## 2023-10-29 DIAGNOSIS — I872 Venous insufficiency (chronic) (peripheral): Secondary | ICD-10-CM | POA: Diagnosis not present

## 2023-10-29 DIAGNOSIS — R112 Nausea with vomiting, unspecified: Secondary | ICD-10-CM

## 2023-10-29 DIAGNOSIS — M48061 Spinal stenosis, lumbar region without neurogenic claudication: Secondary | ICD-10-CM | POA: Diagnosis not present

## 2023-11-02 ENCOUNTER — Inpatient Hospital Stay: Admitting: Internal Medicine

## 2023-11-03 DIAGNOSIS — H259 Unspecified age-related cataract: Secondary | ICD-10-CM | POA: Diagnosis not present

## 2023-11-03 DIAGNOSIS — M51361 Other intervertebral disc degeneration, lumbar region with lower extremity pain only: Secondary | ICD-10-CM | POA: Diagnosis not present

## 2023-11-03 DIAGNOSIS — Z4789 Encounter for other orthopedic aftercare: Secondary | ICD-10-CM | POA: Diagnosis not present

## 2023-11-03 DIAGNOSIS — M48061 Spinal stenosis, lumbar region without neurogenic claudication: Secondary | ICD-10-CM | POA: Diagnosis not present

## 2023-11-03 DIAGNOSIS — E785 Hyperlipidemia, unspecified: Secondary | ICD-10-CM | POA: Diagnosis not present

## 2023-11-03 DIAGNOSIS — I872 Venous insufficiency (chronic) (peripheral): Secondary | ICD-10-CM | POA: Diagnosis not present

## 2023-11-03 DIAGNOSIS — J4520 Mild intermittent asthma, uncomplicated: Secondary | ICD-10-CM | POA: Diagnosis not present

## 2023-11-03 DIAGNOSIS — N3281 Overactive bladder: Secondary | ICD-10-CM | POA: Diagnosis not present

## 2023-11-03 DIAGNOSIS — I10 Essential (primary) hypertension: Secondary | ICD-10-CM | POA: Diagnosis not present

## 2023-11-09 DIAGNOSIS — J4520 Mild intermittent asthma, uncomplicated: Secondary | ICD-10-CM | POA: Diagnosis not present

## 2023-11-09 DIAGNOSIS — Z4789 Encounter for other orthopedic aftercare: Secondary | ICD-10-CM | POA: Diagnosis not present

## 2023-11-09 DIAGNOSIS — M51361 Other intervertebral disc degeneration, lumbar region with lower extremity pain only: Secondary | ICD-10-CM | POA: Diagnosis not present

## 2023-11-09 DIAGNOSIS — E785 Hyperlipidemia, unspecified: Secondary | ICD-10-CM | POA: Diagnosis not present

## 2023-11-09 DIAGNOSIS — H259 Unspecified age-related cataract: Secondary | ICD-10-CM | POA: Diagnosis not present

## 2023-11-09 DIAGNOSIS — I10 Essential (primary) hypertension: Secondary | ICD-10-CM | POA: Diagnosis not present

## 2023-11-09 DIAGNOSIS — I872 Venous insufficiency (chronic) (peripheral): Secondary | ICD-10-CM | POA: Diagnosis not present

## 2023-11-09 DIAGNOSIS — N3281 Overactive bladder: Secondary | ICD-10-CM | POA: Diagnosis not present

## 2023-11-09 DIAGNOSIS — M48061 Spinal stenosis, lumbar region without neurogenic claudication: Secondary | ICD-10-CM | POA: Diagnosis not present

## 2023-11-11 ENCOUNTER — Ambulatory Visit
Admission: RE | Admit: 2023-11-11 | Discharge: 2023-11-11 | Discharge status: Home / Self Care | Source: Ambulatory Visit | Attending: Internal Medicine | Admitting: Internal Medicine

## 2023-11-11 DIAGNOSIS — H259 Unspecified age-related cataract: Secondary | ICD-10-CM | POA: Diagnosis not present

## 2023-11-11 DIAGNOSIS — E785 Hyperlipidemia, unspecified: Secondary | ICD-10-CM | POA: Diagnosis not present

## 2023-11-11 DIAGNOSIS — N3281 Overactive bladder: Secondary | ICD-10-CM | POA: Diagnosis not present

## 2023-11-11 DIAGNOSIS — R112 Nausea with vomiting, unspecified: Secondary | ICD-10-CM

## 2023-11-11 DIAGNOSIS — I10 Essential (primary) hypertension: Secondary | ICD-10-CM | POA: Diagnosis not present

## 2023-11-11 DIAGNOSIS — M51361 Other intervertebral disc degeneration, lumbar region with lower extremity pain only: Secondary | ICD-10-CM | POA: Diagnosis not present

## 2023-11-11 DIAGNOSIS — M48061 Spinal stenosis, lumbar region without neurogenic claudication: Secondary | ICD-10-CM | POA: Diagnosis not present

## 2023-11-11 DIAGNOSIS — Z4789 Encounter for other orthopedic aftercare: Secondary | ICD-10-CM | POA: Diagnosis not present

## 2023-11-11 DIAGNOSIS — J4520 Mild intermittent asthma, uncomplicated: Secondary | ICD-10-CM | POA: Diagnosis not present

## 2023-11-11 DIAGNOSIS — I872 Venous insufficiency (chronic) (peripheral): Secondary | ICD-10-CM | POA: Diagnosis not present

## 2023-11-11 DIAGNOSIS — K828 Other specified diseases of gallbladder: Secondary | ICD-10-CM | POA: Diagnosis not present

## 2023-11-11 DIAGNOSIS — R7989 Other specified abnormal findings of blood chemistry: Secondary | ICD-10-CM

## 2023-11-12 ENCOUNTER — Ambulatory Visit: Admitting: Internal Medicine

## 2023-11-14 ENCOUNTER — Ambulatory Visit: Payer: Self-pay | Admitting: Internal Medicine

## 2023-11-15 DIAGNOSIS — H259 Unspecified age-related cataract: Secondary | ICD-10-CM | POA: Diagnosis not present

## 2023-11-15 DIAGNOSIS — J4520 Mild intermittent asthma, uncomplicated: Secondary | ICD-10-CM | POA: Diagnosis not present

## 2023-11-15 DIAGNOSIS — I10 Essential (primary) hypertension: Secondary | ICD-10-CM | POA: Diagnosis not present

## 2023-11-15 DIAGNOSIS — I872 Venous insufficiency (chronic) (peripheral): Secondary | ICD-10-CM | POA: Diagnosis not present

## 2023-11-15 DIAGNOSIS — N3281 Overactive bladder: Secondary | ICD-10-CM | POA: Diagnosis not present

## 2023-11-15 DIAGNOSIS — E785 Hyperlipidemia, unspecified: Secondary | ICD-10-CM | POA: Diagnosis not present

## 2023-11-15 DIAGNOSIS — Z4789 Encounter for other orthopedic aftercare: Secondary | ICD-10-CM | POA: Diagnosis not present

## 2023-11-15 DIAGNOSIS — M48061 Spinal stenosis, lumbar region without neurogenic claudication: Secondary | ICD-10-CM | POA: Diagnosis not present

## 2023-11-15 DIAGNOSIS — M51361 Other intervertebral disc degeneration, lumbar region with lower extremity pain only: Secondary | ICD-10-CM | POA: Diagnosis not present

## 2023-11-16 DIAGNOSIS — I10 Essential (primary) hypertension: Secondary | ICD-10-CM | POA: Diagnosis not present

## 2023-11-16 DIAGNOSIS — M51361 Other intervertebral disc degeneration, lumbar region with lower extremity pain only: Secondary | ICD-10-CM | POA: Diagnosis not present

## 2023-11-16 DIAGNOSIS — Z4789 Encounter for other orthopedic aftercare: Secondary | ICD-10-CM | POA: Diagnosis not present

## 2023-11-16 DIAGNOSIS — J4520 Mild intermittent asthma, uncomplicated: Secondary | ICD-10-CM | POA: Diagnosis not present

## 2023-11-16 DIAGNOSIS — N3281 Overactive bladder: Secondary | ICD-10-CM | POA: Diagnosis not present

## 2023-11-16 DIAGNOSIS — E785 Hyperlipidemia, unspecified: Secondary | ICD-10-CM | POA: Diagnosis not present

## 2023-11-16 DIAGNOSIS — M48061 Spinal stenosis, lumbar region without neurogenic claudication: Secondary | ICD-10-CM | POA: Diagnosis not present

## 2023-11-16 DIAGNOSIS — I872 Venous insufficiency (chronic) (peripheral): Secondary | ICD-10-CM | POA: Diagnosis not present

## 2023-11-16 DIAGNOSIS — H259 Unspecified age-related cataract: Secondary | ICD-10-CM | POA: Diagnosis not present

## 2023-11-22 DIAGNOSIS — N3281 Overactive bladder: Secondary | ICD-10-CM | POA: Diagnosis not present

## 2023-11-22 DIAGNOSIS — J4520 Mild intermittent asthma, uncomplicated: Secondary | ICD-10-CM | POA: Diagnosis not present

## 2023-11-22 DIAGNOSIS — M51361 Other intervertebral disc degeneration, lumbar region with lower extremity pain only: Secondary | ICD-10-CM | POA: Diagnosis not present

## 2023-11-22 DIAGNOSIS — H259 Unspecified age-related cataract: Secondary | ICD-10-CM | POA: Diagnosis not present

## 2023-11-22 DIAGNOSIS — I10 Essential (primary) hypertension: Secondary | ICD-10-CM | POA: Diagnosis not present

## 2023-11-22 DIAGNOSIS — M48061 Spinal stenosis, lumbar region without neurogenic claudication: Secondary | ICD-10-CM | POA: Diagnosis not present

## 2023-11-22 DIAGNOSIS — Z4789 Encounter for other orthopedic aftercare: Secondary | ICD-10-CM | POA: Diagnosis not present

## 2023-11-22 DIAGNOSIS — I872 Venous insufficiency (chronic) (peripheral): Secondary | ICD-10-CM | POA: Diagnosis not present

## 2023-11-22 DIAGNOSIS — E785 Hyperlipidemia, unspecified: Secondary | ICD-10-CM | POA: Diagnosis not present

## 2023-11-23 DIAGNOSIS — J4520 Mild intermittent asthma, uncomplicated: Secondary | ICD-10-CM | POA: Diagnosis not present

## 2023-11-23 DIAGNOSIS — N3281 Overactive bladder: Secondary | ICD-10-CM | POA: Diagnosis not present

## 2023-11-23 DIAGNOSIS — E785 Hyperlipidemia, unspecified: Secondary | ICD-10-CM | POA: Diagnosis not present

## 2023-11-23 DIAGNOSIS — M51361 Other intervertebral disc degeneration, lumbar region with lower extremity pain only: Secondary | ICD-10-CM | POA: Diagnosis not present

## 2023-11-23 DIAGNOSIS — Z4789 Encounter for other orthopedic aftercare: Secondary | ICD-10-CM | POA: Diagnosis not present

## 2023-11-23 DIAGNOSIS — I10 Essential (primary) hypertension: Secondary | ICD-10-CM | POA: Diagnosis not present

## 2023-11-23 DIAGNOSIS — I872 Venous insufficiency (chronic) (peripheral): Secondary | ICD-10-CM | POA: Diagnosis not present

## 2023-11-23 DIAGNOSIS — H259 Unspecified age-related cataract: Secondary | ICD-10-CM | POA: Diagnosis not present

## 2023-11-23 DIAGNOSIS — M48061 Spinal stenosis, lumbar region without neurogenic claudication: Secondary | ICD-10-CM | POA: Diagnosis not present

## 2023-11-27 DIAGNOSIS — M1712 Unilateral primary osteoarthritis, left knee: Secondary | ICD-10-CM | POA: Diagnosis not present

## 2023-11-30 ENCOUNTER — Ambulatory Visit: Admitting: Internal Medicine

## 2023-11-30 ENCOUNTER — Encounter: Payer: Self-pay | Admitting: Internal Medicine

## 2023-11-30 VITALS — BP 140/80 | HR 60 | Temp 98.1°F | Ht 61.0 in | Wt 169.0 lb

## 2023-11-30 DIAGNOSIS — R7989 Other specified abnormal findings of blood chemistry: Secondary | ICD-10-CM

## 2023-11-30 DIAGNOSIS — N179 Acute kidney failure, unspecified: Secondary | ICD-10-CM | POA: Diagnosis not present

## 2023-11-30 DIAGNOSIS — E875 Hyperkalemia: Secondary | ICD-10-CM | POA: Diagnosis not present

## 2023-11-30 DIAGNOSIS — E785 Hyperlipidemia, unspecified: Secondary | ICD-10-CM

## 2023-11-30 DIAGNOSIS — Z0000 Encounter for general adult medical examination without abnormal findings: Secondary | ICD-10-CM

## 2023-11-30 DIAGNOSIS — I10 Essential (primary) hypertension: Secondary | ICD-10-CM

## 2023-11-30 LAB — LIPID PANEL
Cholesterol: 268 mg/dL — ABNORMAL HIGH (ref 0–200)
HDL: 49.1 mg/dL (ref >39.00)
LDL Cholesterol: 199 mg/dL — ABNORMAL HIGH (ref 0–99)
NonHDL: 218.53
Total CHOL/HDL Ratio: 5
Triglycerides: 100 mg/dL (ref 0.0–149.0)
VLDL: 20 mg/dL (ref 0.0–40.0)

## 2023-11-30 LAB — CBC WITH DIFFERENTIAL/PLATELET
Basophils Absolute: 0 10*3/uL (ref 0.0–0.1)
Basophils Relative: 0.8 % (ref 0.0–3.0)
Eosinophils Absolute: 0.2 10*3/uL (ref 0.0–0.7)
Eosinophils Relative: 3.7 % (ref 0.0–5.0)
HCT: 33.7 % — ABNORMAL LOW (ref 36.0–46.0)
Hemoglobin: 11 g/dL — ABNORMAL LOW (ref 12.0–15.0)
Lymphocytes Relative: 36.3 % (ref 12.0–46.0)
Lymphs Abs: 2.1 10*3/uL (ref 0.7–4.0)
MCHC: 32.6 g/dL (ref 30.0–36.0)
MCV: 92.7 fl (ref 78.0–100.0)
Monocytes Absolute: 0.5 10*3/uL (ref 0.1–1.0)
Monocytes Relative: 7.8 % (ref 3.0–12.0)
Neutro Abs: 3 10*3/uL (ref 1.4–7.7)
Neutrophils Relative %: 51.4 % (ref 43.0–77.0)
Platelets: 210 10*3/uL (ref 150.0–400.0)
RBC: 3.63 Mil/uL — ABNORMAL LOW (ref 3.87–5.11)
RDW: 15 % (ref 11.5–15.5)
WBC: 5.8 10*3/uL (ref 4.0–10.5)

## 2023-11-30 LAB — BASIC METABOLIC PANEL WITH GFR
BUN: 14 mg/dL (ref 6–23)
CO2: 27 meq/L (ref 19–32)
Calcium: 9.8 mg/dL (ref 8.4–10.5)
Chloride: 109 meq/L (ref 96–112)
Creatinine, Ser: 1.17 mg/dL (ref 0.40–1.20)
GFR: 43.36 mL/min — ABNORMAL LOW (ref >60.00)
Glucose, Bld: 102 mg/dL — ABNORMAL HIGH (ref 70–99)
Potassium: 4 meq/L (ref 3.5–5.1)
Sodium: 142 meq/L (ref 135–145)

## 2023-11-30 LAB — URINALYSIS, ROUTINE W REFLEX MICROSCOPIC
Bilirubin Urine: NEGATIVE
Hgb urine dipstick: NEGATIVE
Ketones, ur: NEGATIVE
Nitrite: NEGATIVE
RBC / HPF: NONE SEEN (ref 0–?)
Specific Gravity, Urine: >=1.03 — AB (ref 1.000–1.030)
Urine Glucose: NEGATIVE
Urobilinogen, UA: 0.2 (ref 0.0–1.0)
pH: 6 (ref 5.0–8.0)

## 2023-11-30 LAB — HEPATIC FUNCTION PANEL
ALT: 9 U/L (ref 0–35)
AST: 16 U/L (ref 0–37)
Albumin: 4.1 g/dL (ref 3.5–5.2)
Alkaline Phosphatase: 76 U/L (ref 39–117)
Bilirubin, Direct: 0.1 mg/dL (ref 0.0–0.3)
Total Bilirubin: 0.4 mg/dL (ref 0.2–1.2)
Total Protein: 7 g/dL (ref 6.0–8.3)

## 2023-11-30 LAB — HEMOGLOBIN A1C: Hgb A1c MFr Bld: 5.3 % (ref 4.6–6.5)

## 2023-11-30 NOTE — Progress Notes (Unsigned)
   Subjective:   Patient ID: Catherine Hoffman, female    DOB: 1941-04-08, 83 y.o.   MRN: [id number]  HPI The patient is an 83 YO female coming in for physical. Also needs follow up from AKI and elevated liver numbers from May.   [facility], [facility], social history reviewed and updated  Review of Systems  Constitutional: Negative.   HENT: Negative.    Eyes: Negative.   Respiratory:  Negative for cough, chest tightness and shortness of breath.   Cardiovascular:  Negative for chest pain, palpitations and leg swelling.  Gastrointestinal:  Negative for abdominal distention, abdominal pain, constipation, diarrhea, nausea and vomiting.  Musculoskeletal: Negative.   Skin: Negative.   Neurological: Negative.   Psychiatric/Behavioral: Negative.      Objective:  Physical Exam Constitutional:      Appearance: She is well-developed.  HENT:     Head: Normocephalic and atraumatic.   Cardiovascular:     Rate and Rhythm: Normal rate and regular rhythm.  Pulmonary:     Effort: Pulmonary effort is normal. No respiratory distress.     Breath sounds: Normal breath sounds. No wheezing or rales.  Abdominal:     General: Bowel sounds are normal. There is no distension.     Palpations: Abdomen is soft.     Tenderness: There is no abdominal tenderness. There is no rebound.   Musculoskeletal:     Cervical back: Normal range of motion.   Skin:    General: Skin is warm and dry.   Neurological:     Mental Status: She is alert and oriented to person, place, and time.     Coordination: Coordination normal.     Vitals:   11/30/23 0858 11/30/23 0905  BP: (!) 140/80 (!) 140/80  Pulse: 60   Temp: 98.1 F (36.7 C)   TempSrc: Oral   SpO2: 99%   Weight: 169 lb (76.7 kg)   Height: 5' 1 (1.549 m)     Assessment & Plan:

## 2023-11-30 NOTE — Patient Instructions (Signed)
 We will recheck the labs and the urine today.

## 2023-12-01 ENCOUNTER — Ambulatory Visit: Payer: Self-pay

## 2023-12-01 DIAGNOSIS — E875 Hyperkalemia: Secondary | ICD-10-CM | POA: Insufficient documentation

## 2023-12-01 DIAGNOSIS — R7989 Other specified abnormal findings of blood chemistry: Secondary | ICD-10-CM | POA: Insufficient documentation

## 2023-12-01 DIAGNOSIS — N179 Acute kidney failure, unspecified: Secondary | ICD-10-CM | POA: Insufficient documentation

## 2023-12-01 LAB — URINE CULTURE: Result:: NO GROWTH

## 2023-12-01 NOTE — Assessment & Plan Note (Signed)
 Checking hepatic panel and there was a slight change in bile duct diameter on recent US . If still elevated further assessment may be required.

## 2023-12-01 NOTE — Telephone Encounter (Signed)
 FYI Only or Action Required?: Action required by provider: clinical question for provider, update on patient condition, and lab or test result follow-up needed.  Patient was last seen in primary care on 11/30/2023 by Rollene Almarie LABOR, MD. Called Nurse Triage reporting Flank Pain. Symptoms began yesterday. Interventions attempted: OTC medications: Tylenol . Symptoms are: stable.  Triage Disposition: Call PCP Now  Patient/caregiver understands and will follow disposition?: Yes  FYI- Pt seen yesterday, says Left flank pain is worsening. Would like to speak with PCP regarding labs collected yesterday to see if she has a UTI or kidney stone that needs to be treated. Says that she will take some Tylenol  and rest for now and wait for callback. This RN did advise that if pain persist then to proceed to the ED or UC         Copied from CRM [id number]. Topic: Clinical - Red Word Triage >> Dec 01, 2023  9:05 AM Catherine Hoffman wrote: Reason for CRM: patient would like to be called regarding her left side for possible kidney stone. Patient stated she mentioned to the doctor on yesterday. Patient stated she has blood done and urine test done. Patient stated she can not walk on the left side. Patient stated MD Macdiarmid told her to contact MD Rollene to see if it is a kidney stone then they can accept her in. Patient is having pain Reason for Disposition  [1] Recent medical visit within 24 hours AND [2] condition / symptoms WORSE  Answer Assessment - Initial Assessment Questions 1. LOCATION: Where does it hurt? (e.g., left, right)     Left side, radiating to hip  2. ONSET: When did the pain start?     Pain originally started in April after Lumbar surgery, now within the last 2 weeks pain is more persistent  3. SEVERITY: How bad is the pain? (e.g., Scale 1-10; mild, moderate, or severe)   - MILD (1-3): doesn't interfere with normal activities    - MODERATE (4-7): interferes with normal activities  or awakens from sleep    - SEVERE (8-10): excruciating pain and patient unable to do normal activities (stays in bed)       Moderate to severe  4. PATTERN: Does the pain come and go, or is it constant?      Started intermittently, now becoming more constant within the last 2 weeks 5. CAUSE: What do you think is causing the pain?     Unsure if its a kidney stone or surgery  6. OTHER SYMPTOMS:  Do you have any other symptoms? (e.g., fever, abdomen pain, vomiting, leg weakness, burning with urination, blood in urine)     No  7. PREGNANCY:  Is there any chance you are pregnant? When was your last menstrual period?     no  Answer Assessment - Initial Assessment Questions 1. MAIN CONCERN OR SYMPTOM:  What is your main concern right now? What question do you have? What's the main symptom you're worried about? (e.g., breathing difficulty, cough, fever. pain)     Left side flank pain 2. ONSET: When did the  Left side flank pain  start?     Getting worse over past 2 weeks  3. BETTER-SAME-WORSE: Are you getting better, staying the same, or getting worse compared to how you felt at your last visit to the doctor (most recent medical visit)?     Getting worse  4. VISIT DATE: When were you seen? (Date)     06/24  5. VISIT  DOCTOR: What is the name of the doctor taking care of you now?     Dr. Rollene  6. VISIT DIAGNOSIS:  What was the main symptom or problem that you were seen for? Were you given a diagnosis?      AKI  7. VISIT MEDICINES: Did the doctor order any new medicines for you to use? If Yes, ask: Have you filled the prescription and started taking the medicine?      No  8. NEXT APPOINTMENT: Have you scheduled a follow-up appointment with your doctor?     No appt scheduled   9. PAIN: Is there any pain? If Yes, ask: How bad is it?  (Scale 0-10; or mild, moderate, severe)    - NONE (0): no pain    - MILD (1-3): doesn't interfere with normal  activities     - MODERATE (4-7): interferes with normal activities or awakens from sleep     - SEVERE (8-10): excruciating pain, unable to do any normal activities     Moderate to severe 10. FEVER: Do you have a fever? If Yes, ask: What is it, how was it measured  and when did it start?       No  11. OTHER SYMPTOMS: Do you have any other symptoms?       Trouble walking due to pain radiating down left hip  Protocols used: Flank Pain-A-AH, Recent Medical Visit for Illness Follow-up Call-A-AH

## 2023-12-01 NOTE — Assessment & Plan Note (Signed)
 Not followed up due to lack of transportation. Checking CMP and adjust as needed. She is on agents known to raise K and change in treatment may be needed.

## 2023-12-01 NOTE — Assessment & Plan Note (Signed)
Flu shot yearly. Pneumonia complete. Shingrix due at pharmacy. Tetanus up to date. Colonoscopy aged out. Mammogram aged out, pap smear aged out and dexa complete. Counseled about sun safety and mole surveillance. Counseled about the dangers of distracted driving. Given 10 year screening recommendations.

## 2023-12-01 NOTE — Assessment & Plan Note (Signed)
 BP is borderline today and checking bmp and adjust spironolactone  as needed.

## 2023-12-01 NOTE — Assessment & Plan Note (Signed)
 Creatinine last labs significantly higher than prior and in midst of GI illness. She did not get follow up labs due to transportation. Checking CMP and CBC today.

## 2023-12-01 NOTE — Assessment & Plan Note (Signed)
 Checking lipid panel and adjust as needed she is not on statin.

## 2023-12-02 NOTE — Telephone Encounter (Signed)
 Can you advise for patient her results for the culture have been released

## 2023-12-03 ENCOUNTER — Ambulatory Visit: Payer: Self-pay | Admitting: Internal Medicine

## 2023-12-06 NOTE — Telephone Encounter (Signed)
 Please advise if patient needs for another visit?

## 2023-12-06 NOTE — Telephone Encounter (Signed)
 The lab culture did not grow any bacteria. Her recent US  abdomen with Dr. Garald did not show stones. I suspect this pain is from her back or muscles.

## 2023-12-07 NOTE — Telephone Encounter (Signed)
 Patient was advise and read back her results

## 2023-12-09 DIAGNOSIS — J309 Allergic rhinitis, unspecified: Secondary | ICD-10-CM | POA: Diagnosis not present

## 2023-12-09 DIAGNOSIS — Z91199 Patient's noncompliance with other medical treatment and regimen due to unspecified reason: Secondary | ICD-10-CM | POA: Diagnosis not present

## 2023-12-09 DIAGNOSIS — M150 Primary generalized (osteo)arthritis: Secondary | ICD-10-CM | POA: Diagnosis not present

## 2023-12-09 DIAGNOSIS — Z1339 Encounter for screening examination for other mental health and behavioral disorders: Secondary | ICD-10-CM | POA: Diagnosis not present

## 2023-12-09 DIAGNOSIS — M545 Low back pain, unspecified: Secondary | ICD-10-CM | POA: Diagnosis not present

## 2023-12-09 DIAGNOSIS — Z1331 Encounter for screening for depression: Secondary | ICD-10-CM | POA: Diagnosis not present

## 2023-12-09 DIAGNOSIS — I10 Essential (primary) hypertension: Secondary | ICD-10-CM | POA: Diagnosis not present

## 2023-12-09 DIAGNOSIS — Z7689 Persons encountering health services in other specified circumstances: Secondary | ICD-10-CM | POA: Diagnosis not present

## 2023-12-09 DIAGNOSIS — J4520 Mild intermittent asthma, uncomplicated: Secondary | ICD-10-CM | POA: Diagnosis not present

## 2023-12-09 DIAGNOSIS — Z1389 Encounter for screening for other disorder: Secondary | ICD-10-CM | POA: Diagnosis not present

## 2023-12-27 DIAGNOSIS — M1712 Unilateral primary osteoarthritis, left knee: Secondary | ICD-10-CM | POA: Diagnosis not present

## 2023-12-30 ENCOUNTER — Ambulatory Visit: Payer: Self-pay

## 2023-12-30 NOTE — Telephone Encounter (Signed)
 FYI Only or Action Required?: FYI only for provider.  Patient was last seen in primary care on 11/30/2023 by Rollene Almarie LABOR, MD.  Called Nurse Triage reporting Leg Pain.  Symptoms began about a month ago.  Interventions attempted: Other: Spironilactone.  Symptoms are: unchanged.  Triage Disposition: See PCP When Office is Open (Within 3 Days)  Patient/caregiver understands and will follow disposition?: Yes  ** Patient scheduled for 7/25**             Copied from CRM #[id number]. Topic: Clinical - Red Word Triage >> Dec 30, 2023 11:12 AM Taleah C wrote: Red Word that prompted transfer to Nurse Triage: fluid in legs, left leg pain, stiffness & swelling in feet as well. Reason for Disposition  [1] MODERATE pain (e.g., interferes with normal activities, limping) AND [2] present > 3 days  Answer Assessment - Initial Assessment Questions 1. ONSET: When did the pain start?       Seen last month for similar symptoms, symptoms persist  2. LOCATION: Where is the pain located?      Left leg pain, fluid  3. PAIN: How bad is the pain?    (Scale 1-10; or mild, moderate, severe)     Moderate  4. WORK OR EXERCISE: Has there been any recent work or exercise that involved this part of the body?      No   5. CAUSE: What do you think is causing the leg pain?     Unknown  6. OTHER SYMPTOMS: Do you have any other symptoms? (e.g., chest pain, back pain, breathing difficulty, swelling, rash, fever, numbness, weakness) No     BIL foot swelling, Left leg stiffness. She is keeping elevated, swelling returns  Protocols used: Leg Pain-A-AH

## 2023-12-31 ENCOUNTER — Telehealth: Payer: Self-pay | Admitting: Internal Medicine

## 2023-12-31 ENCOUNTER — Encounter: Payer: Self-pay | Admitting: Internal Medicine

## 2023-12-31 ENCOUNTER — Ambulatory Visit: Admitting: Internal Medicine

## 2023-12-31 VITALS — BP 120/64 | HR 80 | Temp 98.0°F | Ht 61.0 in | Wt 172.4 lb

## 2023-12-31 DIAGNOSIS — M5442 Lumbago with sciatica, left side: Secondary | ICD-10-CM | POA: Diagnosis not present

## 2023-12-31 DIAGNOSIS — I10 Essential (primary) hypertension: Secondary | ICD-10-CM

## 2023-12-31 DIAGNOSIS — M79662 Pain in left lower leg: Secondary | ICD-10-CM

## 2023-12-31 DIAGNOSIS — M7989 Other specified soft tissue disorders: Secondary | ICD-10-CM

## 2023-12-31 DIAGNOSIS — G8929 Other chronic pain: Secondary | ICD-10-CM

## 2023-12-31 MED ORDER — TORSEMIDE 20 MG PO TABS: 20.0000 mg | ORAL_TABLET | Freq: Every day | ORAL | 3 refills | Status: AC | PRN

## 2023-12-31 MED ORDER — TRAMADOL HCL 50 MG PO TABS: 50.0000 mg | ORAL_TABLET | Freq: Four times a day (QID) | ORAL | 0 refills | Status: AC | PRN

## 2023-12-31 NOTE — Patient Instructions (Signed)
 Please take all new medication as prescribed-  the torsemide 20 mg per day as needed only  Please take all new medication as prescribed  - the tramadol  for pain as needed  Please continue all other medications as before.  Please have the pharmacy call with any other refills you may need.  Please keep your appointments with your specialists as you may have planned  Please make a follow up appt with Dr Joshua Neurosurgury due to the pain worsening again  You will be contacted regarding the referral for: Left leg Venous Doppler to check for blood clot

## 2023-12-31 NOTE — Telephone Encounter (Signed)
**Note De-identified  [place] Obfuscation** Please advise 

## 2023-12-31 NOTE — Progress Notes (Signed)
 Patient ID: Catherine Hoffman, female   DOB: [birth year], 83 y.o.   MRN: [id number]        Chief Complaint: follow up left leg swelling x 3 days, and more chronic left LBP with radiation to LLE below the knee, htn       HPI:  Catherine Hoffman is a 83 y.o. female here with family, with 3 days onset acute whole left leg swelling and discomfort, no hx of DVT.  Does have much milder RLE ankle edema as well it seems for some time.  Also has hx of spinal stenosis and neuritic pain improved after feb 2025 surgury with NS Dr Joshua, but for some reason now the pain is back, more severe than before.  Pt denies chest pain, increased sob or doe, wheezing, orthopnea, PND, increased LE swelling, palpitations, dizziness or syncope.   Pt denies polydipsia, polyuria, or new focal neuro s/s.   Also has not been taking aldactone  50 mg as she could not tolerate the back and forth to the BR with walker.  Does not want to restart       Wt Readings from Last 3 Encounters:  12/31/23 172 lb 6.4 oz (78.2 kg)  11/30/23 169 lb (76.7 kg)  09/29/23 169 lb (76.7 kg)   BP Readings from Last 3 Encounters:  12/31/23 120/64  11/30/23 (!) 140/80  10/27/23 (!) 90/58         Past Medical History:  Diagnosis Date   Arthritis    Cataract    removed both eyes    Episodic tension type headache    Esophageal reflux    GERD (gastroesophageal reflux disease)    Glaucoma    Heart murmur    as young adult   Hyperlipidemia    Hypertension    Internal hemorrhoids    Kidney stone on left side Hx-date unknown   Left rotator cuff tear    Past Surgical History:  Procedure Laterality Date   COLONOSCOPY  11/20/2008   COLONOSCOPY     EYE SURGERY Bilateral    cataract   KNEE ARTHROSCOPY Left 2004   LUMBAR LAMINECTOMY/DECOMPRESSION MICRODISCECTOMY Left 09/10/2023   Procedure: LUMBAR LAMINECTOMY/FORAMINATOMY LEFT LUMBAR TWO-LUMBAR THREE LUMBAR THREE-LUMBAR FOUR LUMBAR FOUR-LUMBAR FIVE BILATERAL LUMBAR FIVE-SACRAL-ONE;  Surgeon: Joshua Alm Hamilton, MD;  Location: [facility] OR;  Service: Neurosurgery;  Laterality: Left;   LUMBAR WOUND DEBRIDEMENT N/A 09/29/2023   Procedure: Irrigation and debridement of lumbar wound;  Surgeon: Joshua Alm Hamilton, MD;  Location: [facility] OR;  Service: Neurosurgery;  Laterality: N/A;  Irrigation and debridement of lumbar wound   SHOULDER OPEN ROTATOR CUFF REPAIR Left 08/30/2012   Procedure: ROTATOR CUFF REPAIR SHOULDER OPEN, POSSIBLE GRAFT AND ANCHORS;  Surgeon: Tanda DELENA Heading, MD;  Location: [facility] ;  Service: Orthopedics;  Laterality: Left;   TOTAL ABDOMINAL HYSTERECTOMY W/ BILATERAL SALPINGOOPHORECTOMY  1990's   TOTAL KNEE ARTHROPLASTY Right 09/05/2019   Procedure: RIGHT TOTAL KNEE ARTHROPLASTY;  Surgeon: Sheril Coy, MD;  Location: [facility] ORS;  Service: Orthopedics;  Laterality: Right;    reports that she has never smoked. She has never used smokeless tobacco. She reports that she does not drink alcohol and does not use drugs. family history includes Cancer in her mother; Diabetes in her mother; Hypertension in her mother; Varicose Veins in her maternal uncle. Allergies  Allergen Reactions   Hydrocodone -Acetaminophen  Nausea And Vomiting   Other Hives and Rash    Fresh Fruit-facial rash/hives (bananas, grapes,etc)  Red meat-upset stomach  Tramadol  Itching   Current Outpatient Medications on File Prior to Visit  Medication Sig Dispense Refill   acetaminophen  (TYLENOL ) 500 MG tablet Take 1,000 mg by mouth every 6 (six) hours as needed (pain.).     albuterol  (VENTOLIN  HFA) 108 (90 Base) MCG/ACT inhaler Inhale 2 puffs into the lungs every 6 (six) hours as needed for wheezing or shortness of breath. 8 g 0   aspirin  ([employer] ASPIRIN  ADULT LOW DOSE) 81 MG chewable tablet Chew 1 tablet (81 mg total) by mouth daily. 108 tablet 1   cetirizine  (ZYRTEC ) 10 MG tablet TAKE 1 TABLET BY MOUTH EVERY DAY 90 tablet 3   Cholecalciferol (VITAMIN D3) 50 MCG (2000 UT) TABS Take 2,000 Units by mouth in the  morning.     estradiol  (ESTRACE ) 0.5 MG tablet Take 0.5 mg by mouth every other day.     fluticasone  (FLONASE ) 50 MCG/ACT nasal spray PLACE 2 SPRAYS INTO BOTH NOSTRILS DAILY. SHAKE LIQUID AND USE 2 SPRAYS IN EACH NOSTRIL DAILY (Patient taking differently: Place 2 sprays into both nostrils daily as needed for allergies.) 48 mL 1   gabapentin  (NEURONTIN ) 100 MG capsule Take 1-3 capsules (100-300 mg total) by mouth at bedtime as needed (nerve pain). (Patient taking differently: Take 100 mg by mouth at bedtime as needed (nerve pain).) 90 capsule 3   guaiFENesin -codeine  100-10 MG/5ML syrup Take 5 mLs by mouth every 6 (six) hours as needed for cough (Use also prior to MRI to reduce cough). 120 mL 0   latanoprost  (XALATAN ) 0.005 % ophthalmic solution Place 1 drop into both eyes daily as needed (eye weakness).     MAGNESIUM  PO Take 1 capsule by mouth daily as needed (cramps).     meloxicam  (MOBIC ) 15 MG tablet TAKE 1 TABLET BY MOUTH EVERY DAY 30 tablet 1   montelukast  (SINGULAIR ) 10 MG tablet TAKE 1 TABLET BY MOUTH EVERYDAY AT BEDTIME 90 tablet 1   Omega-3 Fatty Acids (FISH OIL) 1000 MG CAPS Take 1,000 mg by mouth in the morning.     omeprazole  (PRILOSEC) 40 MG capsule TAKE 1 CAPSULE BY MOUTH TWICE DAILY 30 MINUTES BEFORE FIRST AND LAST MEAL OF THE DAY 180 capsule 0   ondansetron  (ZOFRAN ) 4 MG tablet Take 1 tablet (4 mg total) by mouth every 8 (eight) hours as needed for nausea or vomiting. 20 tablet 0   oxybutynin  (DITROPAN -XL) 5 MG 24 hr tablet TAKE 1 TABLET BY MOUTH EVERYDAY AT BEDTIME 90 tablet 1   potassium chloride  SA (KLOR-CON  M) 20 MEQ tablet TAKE 1 TABLET BY MOUTH EVERY DAY 90 tablet 0   spironolactone  (ALDACTONE ) 50 MG tablet TAKE 1 TABLET BY MOUTH EVERY DAY 90 tablet 3   No current facility-administered medications on file prior to visit.        ROS:  All others reviewed and negative.  Objective        PE:  BP 120/64   Pulse 80   Temp 98 F (36.7 C)   Ht 5' 1 (1.549 m)   Wt 172 lb 6.4  oz (78.2 kg)   SpO2 97%   BMI 32.57 kg/m                 Constitutional: Pt appears in NAD               HENT: Head: NCAT.                Right Ear: External ear normal.  Left Ear: External ear normal.                Eyes: . Pupils are equal, round, and reactive to light. Conjunctivae and EOM are normal               Nose: without d/c or deformity               Neck: Neck supple. Gross normal ROM               Cardiovascular: Normal rate and regular rhythm.                 Pulmonary/Chest: Effort normal and breath sounds without rales or wheezing.                Abd:  Soft, NT, ND, + BS, no organomegaly               Neurological: Pt is alert. At baseline orientation, cn 2-12 intact               Skin: Skin is warm. No rashes, no other new lesions, LE edema - 2-3+ LLE without skin change, trace to 1+ left ankle edema               Psychiatric: Pt behavior is normal without agitation   Micro: none  Cardiac tracings I have personally interpreted today:  none  Pertinent Radiological findings (summarize): none   Lab Results  Component Value Date   WBC 5.8 11/30/2023   HGB 11.0 (L) 11/30/2023   HCT 33.7 (L) 11/30/2023   PLT 210.0 11/30/2023   GLUCOSE 102 (H) 11/30/2023   CHOL 268 (H) 11/30/2023   TRIG 100.0 11/30/2023   HDL 49.10 11/30/2023   LDLDIRECT 150.2 05/30/2012   LDLCALC 199 (H) 11/30/2023   ALT 9 11/30/2023   AST 16 11/30/2023   NA 142 11/30/2023   K 4.0 11/30/2023   CL 109 11/30/2023   CREATININE 1.17 11/30/2023   BUN 14 11/30/2023   CO2 27 11/30/2023   TSH 2.85 10/27/2023   INR 1.0 09/09/2023   HGBA1C 5.3 11/30/2023   Assessment/Plan:  Catherine Hoffman is a 83 y.o. Black or African American [2] female with  has a past medical history of Arthritis, Cataract, Episodic tension type headache, Esophageal reflux, GERD (gastroesophageal reflux disease), Glaucoma, Heart murmur, Hyperlipidemia, Hypertension, Internal hemorrhoids, Kidney stone on left side  (Hx-date unknown), and Left rotator cuff tear.  Essential hypertension BP Readings from Last 3 Encounters:  12/31/23 120/64  11/30/23 (!) 140/80  10/27/23 (!) 90/58   Stable, pt to continue medical treatment aldactone  50 qd   Low back pain Improved after surgury feb 2025, unfortuantely now with pain returned mod to severe with left sciatica, now for tramadol  50 mg prn, and refer back to Dr Joshua NS  Pain and swelling of left lower leg I suspect edema likley related to non compliance with aldactone  and does not want restart, ok to change to torsemide  20 every day prn only, but also check LLE venous doppler r/o dvt  Followup: Return if symptoms worsen or fail to improve.  Lynwood Rush, MD 01/01/2024 8:11 PM [facility]

## 2023-12-31 NOTE — Telephone Encounter (Signed)
 Copied from CRM [id number]. Topic: Appointments - Scheduling Inquiry for Clinic >> Dec 31, 2023 12:50 PM Jasmin G wrote: Reason for CRM: Staff from [facility] needs prior authorization from PCP in order to schedule an appt, staff stated that it would be best to upload through MyChart and they would be able to see it from there

## 2024-01-01 ENCOUNTER — Encounter: Payer: Self-pay | Admitting: Internal Medicine

## 2024-01-01 DIAGNOSIS — M7989 Other specified soft tissue disorders: Secondary | ICD-10-CM | POA: Insufficient documentation

## 2024-01-01 NOTE — Assessment & Plan Note (Addendum)
 I suspect edema likley related to non compliance with aldactone  and does not want restart, ok to change to torsemide  20 every day prn only, but also check LLE venous doppler r/o dvt

## 2024-01-01 NOTE — Assessment & Plan Note (Signed)
 BP Readings from Last 3 Encounters:  12/31/23 120/64  11/30/23 (!) 140/80  10/27/23 (!) 90/58   Stable, pt to continue medical treatment aldactone  50 qd

## 2024-01-01 NOTE — Assessment & Plan Note (Addendum)
 Improved after surgury feb 2025, unfortuantely now with pain returned mod to severe with left sciatica, now for tramadol  50 mg prn, and refer back to Dr Joshua NS

## 2024-01-03 ENCOUNTER — Ambulatory Visit: Payer: Self-pay | Admitting: Internal Medicine

## 2024-01-03 ENCOUNTER — Ambulatory Visit (HOSPITAL_COMMUNITY)
Admission: RE | Admit: 2024-01-03 | Discharge: 2024-01-03 | Disposition: A | Discharge status: Home / Self Care | Source: Ambulatory Visit | Attending: Internal Medicine | Admitting: Internal Medicine

## 2024-01-03 DIAGNOSIS — M79662 Pain in left lower leg: Secondary | ICD-10-CM | POA: Insufficient documentation

## 2024-01-03 DIAGNOSIS — M7989 Other specified soft tissue disorders: Secondary | ICD-10-CM | POA: Insufficient documentation

## 2024-01-03 NOTE — Telephone Encounter (Signed)
 We will need more info about this. Is it a request for a referral to see a provider? Or a PA for an imaging test? That would go to referrals not me. If for a referral we would need to know what condition she is requesting referral for

## 2024-01-10 DIAGNOSIS — Z0001 Encounter for general adult medical examination with abnormal findings: Secondary | ICD-10-CM | POA: Diagnosis not present

## 2024-01-10 DIAGNOSIS — Z7982 Long term (current) use of aspirin: Secondary | ICD-10-CM | POA: Diagnosis not present

## 2024-01-10 DIAGNOSIS — Z1389 Encounter for screening for other disorder: Secondary | ICD-10-CM | POA: Diagnosis not present

## 2024-01-10 DIAGNOSIS — Z1331 Encounter for screening for depression: Secondary | ICD-10-CM | POA: Diagnosis not present

## 2024-01-10 DIAGNOSIS — M150 Primary generalized (osteo)arthritis: Secondary | ICD-10-CM | POA: Diagnosis not present

## 2024-01-10 DIAGNOSIS — Z9989 Dependence on other enabling machines and devices: Secondary | ICD-10-CM | POA: Diagnosis not present

## 2024-01-10 DIAGNOSIS — Z1339 Encounter for screening examination for other mental health and behavioral disorders: Secondary | ICD-10-CM | POA: Diagnosis not present

## 2024-01-14 NOTE — Telephone Encounter (Signed)
 Called and reviewed results with patient

## 2024-01-14 NOTE — Telephone Encounter (Signed)
 Lab results printed and will be sent today. Also will attempt to call patient later today.

## 2024-01-27 DIAGNOSIS — M1712 Unilateral primary osteoarthritis, left knee: Secondary | ICD-10-CM | POA: Diagnosis not present

## 2024-01-31 DIAGNOSIS — Z1231 Encounter for screening mammogram for malignant neoplasm of breast: Secondary | ICD-10-CM | POA: Diagnosis not present

## 2024-01-31 DIAGNOSIS — Z01419 Encounter for gynecological examination (general) (routine) without abnormal findings: Secondary | ICD-10-CM | POA: Diagnosis not present

## 2024-02-22 ENCOUNTER — Other Ambulatory Visit: Payer: Self-pay | Admitting: Internal Medicine

## 2024-05-08 ENCOUNTER — Ambulatory Visit

## 2024-05-08 ENCOUNTER — Ambulatory Visit: Admitting: Internal Medicine

## 2024-05-08 VITALS — Ht 61.0 in | Wt 172.0 lb

## 2024-05-08 DIAGNOSIS — Z0000 Encounter for general adult medical examination without abnormal findings: Secondary | ICD-10-CM

## 2024-05-08 DIAGNOSIS — Z780 Asymptomatic menopausal state: Secondary | ICD-10-CM | POA: Diagnosis not present

## 2024-05-08 NOTE — Progress Notes (Signed)
 Chief Complaint  Patient presents with   Medicare Wellness     Subjective:  Please attest and cosign this visit due to patients primary care provider not being in the office at the time the visit was completed.  (Pt of Dr Almarie Cleveland)   Catherine Hoffman is a 83 y.o. female who presents for a Medicare Annual Wellness Visit.  I connected with  Catherine Hoffman on 05/08/24 by a audio enabled telemedicine application and verified that I am speaking with the correct person using two identifiers.  Patient Location: Home  Provider Location: Office/Clinic  Persons Participating in Visit: Patient.  I discussed the limitations of evaluation and management by telemedicine. The patient expressed understanding and agreed to proceed.  Vital Signs: Because this visit was a virtual/telehealth visit, some criteria may be missing or patient reported. Any vitals not documented were not able to be obtained and vitals that have been documented are patient reported.   Allergies (verified) Hydrocodone -acetaminophen , Other, and Tramadol    History: Past Medical History:  Diagnosis Date   Arthritis    Cataract    removed both eyes    Episodic tension type headache    Esophageal reflux    GERD (gastroesophageal reflux disease)    Glaucoma    Heart murmur    as young adult   Hyperlipidemia    Hypertension    Internal hemorrhoids    Kidney stone on left side Hx-date unknown   Left rotator cuff tear    Past Surgical History:  Procedure Laterality Date   COLONOSCOPY  11/20/2008   COLONOSCOPY     EYE SURGERY Bilateral    cataract   KNEE ARTHROSCOPY Left 2004   LUMBAR LAMINECTOMY/DECOMPRESSION MICRODISCECTOMY Left 09/10/2023   Procedure: LUMBAR LAMINECTOMY/FORAMINATOMY LEFT LUMBAR TWO-LUMBAR THREE LUMBAR THREE-LUMBAR FOUR LUMBAR FOUR-LUMBAR FIVE BILATERAL LUMBAR FIVE-SACRAL-ONE;  Surgeon: Joshua Alm Hamilton, MD;  Location: [facility] OR;  Service: Neurosurgery;  Laterality: Left;   LUMBAR WOUND  DEBRIDEMENT N/A 09/29/2023   Procedure: Irrigation and debridement of lumbar wound;  Surgeon: Joshua Alm Hamilton, MD;  Location: [facility] OR;  Service: Neurosurgery;  Laterality: N/A;  Irrigation and debridement of lumbar wound   SHOULDER OPEN ROTATOR CUFF REPAIR Left 08/30/2012   Procedure: ROTATOR CUFF REPAIR SHOULDER OPEN, POSSIBLE GRAFT AND ANCHORS;  Surgeon: Tanda DELENA Heading, MD;  Location: [facility];  Service: Orthopedics;  Laterality: Left;   TOTAL ABDOMINAL HYSTERECTOMY W/ BILATERAL SALPINGOOPHORECTOMY  1990's   TOTAL KNEE ARTHROPLASTY Right 09/05/2019   Procedure: RIGHT TOTAL KNEE ARTHROPLASTY;  Surgeon: Sheril Coy, MD;  Location: [facility] ORS;  Service: Orthopedics;  Laterality: Right;   Family History  Problem Relation Age of Onset   Hypertension Mother    Diabetes Mother    Cancer Mother        in leg   Varicose Veins Maternal Uncle    Colon cancer Neg Hx    Colon polyps Neg Hx    Esophageal cancer Neg Hx    Stomach cancer Neg Hx    Rectal cancer Neg Hx    Social History   Occupational History   Occupation: Lexicographer  Tobacco Use   Smoking status: Never   Smokeless tobacco: Never  Vaping Use   Vaping status: Never Used  Substance and Sexual Activity   Alcohol use: No   Drug use: No   Sexual activity: Not Currently   Tobacco Counseling Counseling given: Not Answered  SDOH Screenings   Food Insecurity: No Food Insecurity (05/08/2024)  Housing: Unknown (05/08/2024)  Transportation Needs: No Transportation Needs (05/08/2024)  Utilities: Not At Risk (05/08/2024)  Alcohol Screen: Low Risk  (05/04/2023)  Depression (PHQ2-9): Low Risk  (05/08/2024)  Financial Resource Strain: Low Risk  (05/04/2023)  Physical Activity: Inactive (05/08/2024)  Social Connections: Moderately Integrated (05/08/2024)  Stress: No Stress Concern Present (05/08/2024)  Tobacco Use: Low Risk  (05/08/2024)  Health Literacy: Adequate Health Literacy (05/08/2024)   See flowsheets for  full screening details  Depression Screen PHQ 2 & 9 Depression Scale- Over the past 2 weeks, how often have you been bothered by any of the following problems? Little interest or pleasure in doing things: 0 Feeling down, depressed, or hopeless (PHQ Adolescent also includes...irritable): 0 PHQ-2 Total Score: 0 Trouble falling or staying asleep, or sleeping too much: 1 (sleep is broken up due to going to bathroom) Feeling tired or having little energy: 0 Poor appetite or overeating (PHQ Adolescent also includes...weight loss): 0 Feeling bad about yourself - or that you are a failure or have let yourself or your family down: 0 Trouble concentrating on things, such as reading the newspaper or watching television (PHQ Adolescent also includes...like school work): 0 Moving or speaking so slowly that other people could have noticed. Or the opposite - being so fidgety or restless that you have been moving around a lot more than usual: 3 (sciatica discomfort - lower back & uses a walker mostly) Thoughts that you would be better off dead, or of hurting yourself in some way: 0 PHQ-9 Total Score: 4 If you checked off any problems, how difficult have these problems made it for you to do your work, take care of things at home, or get along with other people?: Not difficult at all  Depression Treatment Depression Interventions/Treatment : EYV7-0 Score <4 Follow-up Not Indicated     Goals Addressed               This Visit's Progress     Patient Stated (pt-stated)        Patient stated she plans to manage the sciatica discomfort and manage health.       Visit info / Clinical Intake: Medicare Wellness Visit Type:: Subsequent Annual Wellness Visit Persons participating in visit:: patient Medicare Wellness Visit Mode:: Telephone If telephone:: video declined Because this visit was a virtual/telehealth visit:: pt reported vitals If Telephone or Video please confirm:: I connected with the patient  using audio enabled telemedicine application and verified that I am speaking with the correct person using two identifiers; I discussed the limitations of evaluation and management by telemedicine; The patient expressed understanding and agreed to proceed Patient Location:: Home Provider Location:: Office Information given by:: patient Interpreter Needed?: No Pre-visit prep was completed: yes AWV questionnaire completed by patient prior to visit?: no Living arrangements:: (!) lives alone Patient's Overall Health Status Rating: (!) fair Typical amount of pain: none Does pain affect daily life?: (!) yes (sometimes) Are you currently prescribed opioids?: (!) yes  Dietary Habits and Nutritional Risks How many meals a day?: 2 Eats fruit and vegetables daily?: yes Most meals are obtained by: preparing own meals In the last 2 weeks, have you had any of the following?: none Diabetic:: no  Functional Status Activities of Daily Living (to include ambulation/medication): Independent Ambulation: Independent with device- listed below Home Assistive Devices/Equipment: Eyeglasses; Walker (specify Type); Cane Medication Administration: Independent Home Management: Independent Manage your own finances?: yes Primary transportation is: family/friends Concerns about vision?: no *vision screening is required for  WTM* Concerns about hearing?: no  Fall Screening Falls in the past year?: 1 Number of falls in past year: 0 Was there an injury with Fall?: 0 Fall Risk Category Calculator: 1 Patient Fall Risk Level: Low Fall Risk  Fall Risk Patient at Risk for Falls Due to: Impaired balance/gait Fall risk Follow up: Falls evaluation completed; Falls prevention discussed  Home and Transportation Safety: All rugs have non-skid backing?: N/A, no rugs All stairs or steps have railings?: N/A, no stairs Grab bars in the bathtub or shower?: (!) no Have non-skid surface in bathtub or shower?: yes Good home  lighting?: yes Regular seat belt use?: yes Hospital stays in the last year:: (!) yes How many hospital stays:: 3 Reason: sciatica nerve  Cognitive Assessment Difficulty concentrating, remembering, or making decisions? : no Will 6CIT or Mini Cog be Completed: yes What year is it?: 0 points What month is it?: 0 points Give patient an address phrase to remember (5 components): [address] [place], [place] About what time is it?: 0 points Count backwards from 20 to 1: 0 points Say the months of the year in reverse: 0 points Repeat the address phrase from earlier: 0 points 6 CIT Score: 0 points  Advance Directives (For Healthcare) Does Patient Have a Medical Advance Directive?: Yes Does patient want to make changes to medical advance directive?: Yes (Inpatient - patient requests chaplain consult to change a medical advance directive) Type of Advance Directive: Healthcare Power of [place]; Living will Copy of Healthcare Power of Attorney in Chart?: Yes - validated most recent copy scanned in chart (See row information) Copy of Living Will in Chart?: Yes - validated most recent copy scanned in chart (See row information) Would patient like information on creating a medical advance directive?: No - Patient declined  Reviewed/Updated  Reviewed/Updated: Reviewed All (Medical, Surgical, Family, Medications, Allergies, Care Teams, Patient Goals)        Objective:    Today's Vitals   05/08/24 0813  Weight: 172 lb (78 kg)  Height: 5' 1 (1.549 m)  PainSc: 8   PainLoc: Back   Body mass index is 32.5 kg/m.  Current Medications (verified) Outpatient Encounter Medications as of 05/08/2024  Medication Sig   acetaminophen  (TYLENOL ) 500 MG tablet Take 1,000 mg by mouth every 6 (six) hours as needed (pain.).   albuterol  (VENTOLIN  HFA) 108 (90 Base) MCG/ACT inhaler Inhale 2 puffs into the lungs every 6 (six) hours as needed for wheezing or shortness of breath.   aspirin  ([employer] ASPIRIN  ADULT  LOW DOSE) 81 MG chewable tablet Chew 1 tablet (81 mg total) by mouth daily.   cetirizine  (ZYRTEC ) 10 MG tablet TAKE 1 TABLET BY MOUTH EVERY DAY   Cholecalciferol (VITAMIN D3) 50 MCG (2000 UT) TABS Take 2,000 Units by mouth in the morning.   estradiol  (ESTRACE ) 0.5 MG tablet Take 0.5 mg by mouth every other day.   fluticasone  (FLONASE ) 50 MCG/ACT nasal spray PLACE 2 SPRAYS INTO BOTH NOSTRILS DAILY. SHAKE LIQUID AND USE 2 SPRAYS IN EACH NOSTRIL DAILY (Patient taking differently: Place 2 sprays into both nostrils daily as needed for allergies.)   gabapentin  (NEURONTIN ) 100 MG capsule Take 1-3 capsules (100-300 mg total) by mouth at bedtime as needed (nerve pain). (Patient taking differently: Take 100 mg by mouth at bedtime as needed (nerve pain).)   guaiFENesin -codeine  100-10 MG/5ML syrup Take 5 mLs by mouth every 6 (six) hours as needed for cough (Use also prior to MRI to reduce cough).   latanoprost  (  XALATAN ) 0.005 % ophthalmic solution Place 1 drop into both eyes daily as needed (eye weakness).   MAGNESIUM  PO Take 1 capsule by mouth daily as needed (cramps).   meloxicam  (MOBIC ) 15 MG tablet TAKE 1 TABLET BY MOUTH EVERY DAY   montelukast  (SINGULAIR ) 10 MG tablet TAKE 1 TABLET BY MOUTH EVERYDAY AT BEDTIME   Omega-3 Fatty Acids (FISH OIL) 1000 MG CAPS Take 1,000 mg by mouth in the morning.   omeprazole  (PRILOSEC) 40 MG capsule TAKE 1 CAPSULE BY MOUTH TWICE DAILY 30 MINUTES BEFORE FIRST AND LAST MEAL OF THE DAY   ondansetron  (ZOFRAN ) 4 MG tablet Take 1 tablet (4 mg total) by mouth every 8 (eight) hours as needed for nausea or vomiting.   oxybutynin  (DITROPAN -XL) 5 MG 24 hr tablet TAKE 1 TABLET BY MOUTH EVERYDAY AT BEDTIME   potassium chloride  SA (KLOR-CON  M) 20 MEQ tablet TAKE 1 TABLET BY MOUTH EVERY DAY   spironolactone  (ALDACTONE ) 50 MG tablet TAKE 1 TABLET BY MOUTH EVERY DAY   torsemide  (DEMADEX ) 20 MG tablet Take 1 tablet (20 mg total) by mouth daily as needed.   traMADol  (ULTRAM ) 50 MG tablet Take  1 tablet (50 mg total) by mouth every 6 (six) hours as needed.   No facility-administered encounter medications on file as of 05/08/2024.   Hearing/Vision screen Hearing Screening - Comments:: Denies hearing difficulties   Vision Screening - Comments:: Wears rx glasses - up to date with routine eye exams [facility] Immunizations and Health Maintenance Health Maintenance  Topic Date Due   Bone Density Scan  Never done   Influenza Vaccine  01/07/2024   COVID-19 Vaccine (7 - 2025-26 season) 02/07/2024   Zoster Vaccines- Shingrix (1 of 2) 08/06/2024 (Originally 01/31/1991)   Medicare Annual Wellness (AWV)  05/08/2025   DTaP/Tdap/Td (3 - Td or Tdap) 05/25/2026   Pneumococcal Vaccine: 50+ Years  Completed   Meningococcal B Vaccine  Aged Out        Assessment/Plan:  This is a routine wellness examination for Catherine Hoffman.  Patient Care Team: Rollene Almarie LABOR, MD as PCP - General (Internal Medicine) [facility] (Ophthalmology)  I have personally reviewed and noted the following in the patient's chart:   Medical and social history Use of alcohol, tobacco or illicit drugs  Current medications and supplements including opioid prescriptions. Functional ability and status Nutritional status Physical activity Advanced directives List of other physicians Hospitalizations, surgeries, and ER visits in previous 12 months Vitals Screenings to include cognitive, depression, and falls Referrals and appointments  Orders Placed This Encounter  Procedures   DG Bone Density    Standing Status:   Future    Expiration Date:   05/08/2025    Reason for Exam (SYMPTOM  OR DIAGNOSIS REQUIRED):   postmenopausal estrogen deficiency    Preferred imaging location?:   [address]   In addition, I have reviewed and discussed with patient certain preventive protocols, quality metrics, and best practice recommendations. A written personalized care plan for preventive services as well  as general preventive health recommendations were provided to patient.   Catherine Hoffman, CMA   05/08/2024   Return in 1 year (on 05/08/2025).  After Visit Summary: (Declined) Due to this being a telephonic visit, with patients personalized plan was offered to patient but patient Declined AVS at this time   Nurse Notes: Scheduled 6-mth f/u w/PCP for 05/15/2024 (will get Influenza vaccine at that time).

## 2024-05-08 NOTE — Patient Instructions (Addendum)
 Catherine Hoffman,  Thank you for taking the time for your Medicare Wellness Visit. I appreciate your continued commitment to your health goals. Please review the care plan we discussed, and feel free to reach out if I can assist you further.  Please note that Annual Wellness Visits do not include a physical exam. Some assessments may be limited, especially if the visit was conducted virtually. If needed, we may recommend an in-person follow-up with your provider.  Ongoing Care Seeing your primary care provider every 3 to 6 months helps us  monitor your health and provide consistent, personalized care.   Referrals If a referral was made during today's visit and you haven't received any updates within two weeks, please contact the referred provider directly to check on the status.  Recommended Screenings:  Health Maintenance  Topic Date Due   Zoster (Shingles) Vaccine (1 of 2) Never done   Osteoporosis screening with Bone Density Scan  Never done   Flu Shot  01/07/2024   COVID-19 Vaccine (7 - 2025-26 season) 02/07/2024   Medicare Annual Wellness Visit  05/08/2025   DTaP/Tdap/Td vaccine (3 - Td or Tdap) 05/25/2026   Pneumococcal Vaccine for age over 32  Completed   Meningitis B Vaccine  Aged Out       05/08/2024    8:16 AM  Advanced Directives  Does Patient Have a Medical Advance Directive? Yes  Type of Estate Agent of [place];Living will  Does patient want to make changes to medical advance directive? Yes (Inpatient - patient requests chaplain consult to change a medical advance directive)  Copy of Healthcare Power of Attorney in Chart? Yes - validated most recent copy scanned in chart (See row information)    Vision: Annual vision screenings are recommended for early detection of glaucoma, cataracts, and diabetic retinopathy. These exams can also reveal signs of chronic conditions such as diabetes and high blood pressure.  Dental: Annual dental screenings help  detect early signs of oral cancer, gum disease, and other conditions linked to overall health, including heart disease and diabetes.  Managing Pain Without Opioids Opioids are strong medicines used to treat moderate to severe pain. For some people, especially those who have long-term (chronic) pain, opioids may not be the best choice for pain management due to: Side effects like nausea, constipation, and sleepiness. The risk of addiction (opioid use disorder). The longer you take opioids, the greater your risk of addiction. Pain that lasts for more than 3 months is called chronic pain. Managing chronic pain usually requires more than one approach and is often provided by a team of health care providers working together (multidisciplinary approach). Pain management may be done at a pain management center or pain clinic. How to manage pain without the use of opioids Use non-opioid medicines Non-opioid medicines for pain may include: Over-the-counter or prescription non-steroidal anti-inflammatory drugs (NSAIDs). These may be the first medicines used for pain. They work well for muscle and bone pain, and they reduce swelling. Acetaminophen . This over-the-counter medicine may work well for milder pain but not swelling. Antidepressants. These may be used to treat chronic pain. A certain type of antidepressant (tricyclics) is often used. These medicines are given in lower doses for pain than when used for depression. Anticonvulsants. These are usually used to treat seizures but may also reduce nerve (neuropathic) pain. Muscle relaxants. These relieve pain caused by sudden muscle tightening (spasms). You may also use a pain medicine that is applied to the skin as a patch, cream,  or gel (topical analgesic), such as a numbing medicine. These may cause fewer side effects than medicines taken by mouth. Do certain therapies as directed Some therapies can help with pain management. They include: Physical  therapy. You will do exercises to gain strength and flexibility. A physical therapist may teach you exercises to move and stretch parts of your body that are weak, stiff, or painful. You can learn these exercises at physical therapy visits and practice them at home. Physical therapy may also involve: Massage. Heat wraps or applying heat or cold to affected areas. Electrical signals that interrupt pain signals (transcutaneous electrical nerve stimulation, TENS). Weak lasers that reduce pain and swelling (low-level laser therapy). Signals from your body that help you learn to regulate pain (biofeedback). Occupational therapy. This helps you to learn ways to function at home and work with less pain. Recreational therapy. This involves trying new activities or hobbies, such as a physical activity or drawing. Mental health therapy, including: Cognitive behavioral therapy (CBT). This helps you learn coping skills for dealing with pain. Acceptance and commitment therapy (ACT) to change the way you think and react to pain. Relaxation therapies, including muscle relaxation exercises and mindfulness-based stress reduction. Pain management counseling. This may be individual, family, or group counseling.  Receive medical treatments Medical treatments for pain management include: Nerve block injections. These may include a pain blocker and anti-inflammatory medicines. You may have injections: Near the spine to relieve chronic back or neck pain. Into joints to relieve back or joint pain. Into nerve areas that supply a painful area to relieve body pain. Into muscles (trigger point injections) to relieve some painful muscle conditions. A medical device placed near your spine to help block pain signals and relieve nerve pain or chronic back pain (spinal cord stimulation device). Acupuncture. Follow these instructions at home Medicines Take over-the-counter and prescription medicines only as told by your  health care provider. If you are taking pain medicine, ask your health care providers about possible side effects to watch out for. Do not drive or use heavy machinery while taking prescription opioid pain medicine. Lifestyle  Do not use drugs or alcohol to reduce pain. If you drink alcohol, limit how much you have to: 0-1 drink a day for women who are not pregnant. 0-2 drinks a day for men. Know how much alcohol is in a drink. In the [place] one drink equals one 12 oz bottle of beer (355 mL), one 5 oz glass of wine (148 mL), or one 1 oz glass of hard liquor (44 mL). Do not use any products that contain nicotine or tobacco. These products include cigarettes, chewing tobacco, and vaping devices, such as e-cigarettes. If you need help quitting, ask your health care provider. Eat a healthy diet and maintain a healthy weight. Poor diet and excess weight may make pain worse. Eat foods that are high in fiber. These include fresh fruits and vegetables, whole grains, and beans. Limit foods that are high in fat and processed sugars, such as fried and sweet foods. Exercise regularly. Exercise lowers stress and may help relieve pain. Ask your health care provider what activities and exercises are safe for you. If your health care provider approves, join an exercise class that combines movement and stress reduction. Examples include yoga and tai chi. Get enough sleep. Lack of sleep may make pain worse. Lower stress as much as possible. Practice stress reduction techniques as told by your therapist. General instructions Work with all your pain management providers  to find the treatments that work best for you. You are an important member of your pain management team. There are many things you can do to reduce pain on your own. Consider joining an online or in-person support group for people who have chronic pain. Keep all follow-up visits. This is important. Where to find more information You can find more  information about managing pain without opioids from: American Academy of Pain Medicine: painmed.org Institute for Chronic Pain: instituteforchronicpain.org American Chronic Pain Association: theacpa.org Contact a health care provider if: You have side effects from pain medicine. Your pain gets worse or does not get better with treatments or home therapy. You are struggling with anxiety or depression. Summary Many types of pain can be managed without opioids. Chronic pain may respond better to pain management without opioids. Pain is best managed when you and a team of health care providers work together. Pain management without opioids may include non-opioid medicines, medical treatments, physical therapy, mental health therapy, and lifestyle changes. Tell your health care providers if your pain gets worse or is not being managed well enough. This information is not intended to replace advice given to you by your health care provider. Make sure you discuss any questions you have with your health care provider. Document Revised: 09/04/2020 Document Reviewed: 09/04/2020 Elsevier Patient Education  2024 [employer].

## 2024-05-15 ENCOUNTER — Ambulatory Visit: Admitting: Internal Medicine

## 2024-05-15 ENCOUNTER — Encounter: Payer: Self-pay | Admitting: Internal Medicine

## 2024-05-15 ENCOUNTER — Ambulatory Visit: Payer: Self-pay | Admitting: Internal Medicine

## 2024-05-15 VITALS — BP 110/70 | HR 80 | Temp 97.6°F | Ht 61.0 in | Wt 181.4 lb

## 2024-05-15 DIAGNOSIS — I10 Essential (primary) hypertension: Secondary | ICD-10-CM

## 2024-05-15 DIAGNOSIS — Z23 Encounter for immunization: Secondary | ICD-10-CM

## 2024-05-15 DIAGNOSIS — R7989 Other specified abnormal findings of blood chemistry: Secondary | ICD-10-CM

## 2024-05-15 DIAGNOSIS — E785 Hyperlipidemia, unspecified: Secondary | ICD-10-CM

## 2024-05-15 DIAGNOSIS — G8929 Other chronic pain: Secondary | ICD-10-CM

## 2024-05-15 DIAGNOSIS — R7301 Impaired fasting glucose: Secondary | ICD-10-CM

## 2024-05-15 LAB — COMPREHENSIVE METABOLIC PANEL WITH GFR
ALT: 8 U/L (ref 0–35)
AST: 14 U/L (ref 0–37)
Albumin: 3.8 g/dL (ref 3.5–5.2)
Alkaline Phosphatase: 113 U/L (ref 39–117)
BUN: 22 mg/dL (ref 6–23)
CO2: 23 meq/L (ref 19–32)
Calcium: 9.4 mg/dL (ref 8.4–10.5)
Chloride: 110 meq/L (ref 96–112)
Creatinine, Ser: 0.93 mg/dL (ref 0.40–1.20)
GFR: 56.93 mL/min — ABNORMAL LOW (ref >60.00)
Glucose, Bld: 99 mg/dL (ref 70–99)
Potassium: 3.5 meq/L (ref 3.5–5.1)
Sodium: 143 meq/L (ref 135–145)
Total Bilirubin: 0.5 mg/dL (ref 0.2–1.2)
Total Protein: 6.6 g/dL (ref 6.0–8.3)

## 2024-05-15 LAB — LIPID PANEL
Cholesterol: 188 mg/dL (ref 0–200)
HDL: 46.4 mg/dL (ref >39.00)
LDL Cholesterol: 130 mg/dL — ABNORMAL HIGH (ref 0–99)
NonHDL: 141.99
Total CHOL/HDL Ratio: 4
Triglycerides: 62 mg/dL (ref 0.0–149.0)
VLDL: 12.4 mg/dL (ref 0.0–40.0)

## 2024-05-15 LAB — HEMOGLOBIN A1C: Hgb A1c MFr Bld: 6 % (ref 4.6–6.5)

## 2024-05-15 NOTE — Patient Instructions (Signed)
 We will check the labs today.

## 2024-05-15 NOTE — Assessment & Plan Note (Signed)
 Previous cholesterol levels were elevated. Ordered blood work to recheck cholesterol levels.

## 2024-05-15 NOTE — Assessment & Plan Note (Signed)
 Chronic pain persists post-surgery, likely contributing to left leg neuropathy. Scheduled MRI to evaluate back pain and potential causes of neuropathy.

## 2024-05-15 NOTE — Progress Notes (Signed)
   Subjective:   Patient ID: Catherine Hoffman, female    DOB: [birth year], 83 y.o.   MRN: [id number]  Discussed the use of AI scribe software for clinical note transcription with the patient, who gave verbal consent to proceed.  History of Present Illness Catherine Hoffman is an 83 year old female with arthritis who presents with worsening back pain and left leg symptoms.  She has been experiencing ongoing back pain, which has worsened since her surgery in April. The pain is particularly severe in her left leg and is exacerbated by cold weather. It radiates from her left hip down her leg.  She experiences numbness and weakness in her legs, especially on the left side, describing it as feeling like 'walking on pins and needles.' This sensation has persisted since her surgery, and she feels less steady when walking.  There is significant swelling in her left leg and frequent muscle cramps, which sometimes cause stiffness in her fingers.  She has a history of constipation, which she attributes to her medications. She manages this with dietary adjustments, such as increasing her vegetable intake, and occasionally uses laxatives.  No chest pain, heart racing, breathing problems, dizziness, lightheadedness, headaches, or skin changes.  Review of Systems  Constitutional:  Positive for activity change. Negative for appetite change, chills, fatigue, fever and unexpected weight change.  Respiratory: Negative.    Cardiovascular: Negative.   Gastrointestinal: Negative.   Musculoskeletal:  Positive for arthralgias, back pain and myalgias. Negative for gait problem and joint swelling.  Skin: Negative.   Neurological: Negative.     Objective:  Physical Exam Constitutional:      Appearance: Normal appearance.  HENT:     Head: Normocephalic.  Cardiovascular:     Rate and Rhythm: Normal rate and regular rhythm.  Pulmonary:     Effort: Pulmonary effort is normal.  Musculoskeletal:        General:  Tenderness present. Normal range of motion.  Skin:    General: Skin is warm and dry.  Neurological:     General: No focal deficit present.     Mental Status: She is alert and oriented to person, place, and time.     Vitals:   05/15/24 0937  BP: 110/70  Pulse: 80  Temp: 97.6 F (36.4 C)  TempSrc: Oral  SpO2: 99%  Weight: 181 lb 6.4 oz (82.3 kg)  Height: 5' 1 (1.549 m)  Flu shot given at visit  Assessment and Plan Assessment & Plan Chronic left leg and hip pain with neuropathy and swelling   Persistent pain with neuropathy and swelling, worsened by cold. Symptoms include numbness, weakness, cramps, increased swelling, and stiffness. Neuropathy likely linked to back issues. Scheduled MRI to evaluate left leg and hip pain and neuropathy.  Chronic back pain with history of spine surgery   Chronic pain persists post-surgery, likely contributing to left leg neuropathy. Scheduled MRI to evaluate back pain and potential causes of neuropathy.  Essential hypertension BP at goal on current regimen. Will check CMP and adjust as needed.   Hyperlipidemia   Previous cholesterol levels were elevated. Ordered blood work to recheck cholesterol levels.

## 2024-05-15 NOTE — Assessment & Plan Note (Signed)
Checking CMP for stability. Adjust as needed. 

## 2024-05-15 NOTE — Assessment & Plan Note (Signed)
 BP at goal on current regimen. Will check CMP and adjust as needed.

## 2024-05-19 NOTE — Progress Notes (Addendum)
 Mail box is full, unable to leave message. Attempt #1

## 2024-05-23 ENCOUNTER — Other Ambulatory Visit: Payer: Self-pay | Admitting: Internal Medicine

## 2025-05-16 ENCOUNTER — Ambulatory Visit

## 2025-05-16 ENCOUNTER — Encounter: Admitting: Internal Medicine
